# Patient Record
Sex: Female | Born: 1950
Health system: Southern US, Community
[De-identification: ages and names within clinical notes are randomized; demographics above are authoritative.]

## PROBLEM LIST (undated history)

## (undated) DIAGNOSIS — R0600 Dyspnea, unspecified: Secondary | ICD-10-CM

## (undated) DIAGNOSIS — I639 Cerebral infarction, unspecified: Secondary | ICD-10-CM

## (undated) DIAGNOSIS — H9190 Unspecified hearing loss, unspecified ear: Secondary | ICD-10-CM

## (undated) DIAGNOSIS — D62 Acute posthemorrhagic anemia: Secondary | ICD-10-CM

## (undated) DIAGNOSIS — K3189 Other diseases of stomach and duodenum: Secondary | ICD-10-CM

## (undated) DIAGNOSIS — K279 Peptic ulcer, site unspecified, unspecified as acute or chronic, without hemorrhage or perforation: Secondary | ICD-10-CM

## (undated) DIAGNOSIS — M810 Age-related osteoporosis without current pathological fracture: Secondary | ICD-10-CM

## (undated) DIAGNOSIS — B9681 Helicobacter pylori [H. pylori] as the cause of diseases classified elsewhere: Secondary | ICD-10-CM

## (undated) DIAGNOSIS — R011 Cardiac murmur, unspecified: Secondary | ICD-10-CM

## (undated) DIAGNOSIS — M1990 Unspecified osteoarthritis, unspecified site: Secondary | ICD-10-CM

## (undated) DIAGNOSIS — E7800 Pure hypercholesterolemia, unspecified: Secondary | ICD-10-CM

## (undated) DIAGNOSIS — D120 Benign neoplasm of cecum: Secondary | ICD-10-CM

## (undated) DIAGNOSIS — M86149 Other acute osteomyelitis, unspecified hand: Secondary | ICD-10-CM

## (undated) DIAGNOSIS — K2970 Gastritis, unspecified, without bleeding: Secondary | ICD-10-CM

## (undated) DIAGNOSIS — Z1211 Encounter for screening for malignant neoplasm of colon: Secondary | ICD-10-CM

## (undated) DIAGNOSIS — I2510 Atherosclerotic heart disease of native coronary artery without angina pectoris: Secondary | ICD-10-CM

## (undated) DIAGNOSIS — I219 Acute myocardial infarction, unspecified: Secondary | ICD-10-CM

## (undated) DIAGNOSIS — N179 Acute kidney failure, unspecified: Secondary | ICD-10-CM

## (undated) DIAGNOSIS — K253 Acute gastric ulcer without hemorrhage or perforation: Secondary | ICD-10-CM

## (undated) DIAGNOSIS — I10 Essential (primary) hypertension: Secondary | ICD-10-CM

## (undated) HISTORY — DX: Essential (primary) hypertension: I10

## (undated) HISTORY — DX: Acute gastric ulcer without hemorrhage or perforation: K25.3

## (undated) HISTORY — DX: Peptic ulcer, site unspecified, unspecified as acute or chronic, without hemorrhage or perforation: K27.9

## (undated) HISTORY — DX: Helicobacter pylori (H. pylori) as the cause of diseases classified elsewhere: K29.70

## (undated) HISTORY — DX: Encounter for screening for malignant neoplasm of colon: Z12.11

## (undated) HISTORY — PX: CAROTID STENT: SHX1301

## (undated) HISTORY — DX: Other diseases of stomach and duodenum: K31.89

## (undated) HISTORY — DX: Cerebral infarction, unspecified: I63.9

## (undated) HISTORY — PX: THYROID SURGERY: SHX805

## (undated) HISTORY — DX: Helicobacter pylori (H. pylori) as the cause of diseases classified elsewhere: B96.81

## (undated) HISTORY — DX: Acute myocardial infarction, unspecified: I21.9

## (undated) HISTORY — DX: Benign neoplasm of cecum: D12.0

## (undated) HISTORY — DX: Acute posthemorrhagic anemia: D62

## (undated) HISTORY — PX: INCONTINENCE SURGERY: SHX676

---
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
## 2001-02-15 ENCOUNTER — Ambulatory Visit (HOSPITAL_COMMUNITY): Admission: RE | Admit: 2001-02-15 | Discharge: 2001-02-15 | Payer: Self-pay | Admitting: Urology

## 2009-05-28 ENCOUNTER — Ambulatory Visit: Payer: Self-pay

## 2009-11-13 ENCOUNTER — Ambulatory Visit: Payer: Self-pay

## 2010-05-28 ENCOUNTER — Ambulatory Visit: Payer: Self-pay

## 2013-03-06 ENCOUNTER — Ambulatory Visit: Payer: Self-pay | Admitting: Family Medicine

## 2013-03-09 ENCOUNTER — Ambulatory Visit: Payer: Self-pay | Admitting: Family Medicine

## 2013-05-09 ENCOUNTER — Ambulatory Visit: Payer: Self-pay | Admitting: Urology

## 2014-01-11 LAB — HM PAP SMEAR: HM PAP: NEGATIVE

## 2014-05-15 ENCOUNTER — Inpatient Hospital Stay: Payer: Self-pay | Admitting: Internal Medicine

## 2014-05-15 LAB — COMPREHENSIVE METABOLIC PANEL
ALK PHOS: 108 U/L
ALT: 22 U/L
AST: 89 U/L — AB (ref 15–37)
Albumin: 4.1 g/dL (ref 3.4–5.0)
Anion Gap: 17 — ABNORMAL HIGH (ref 7–16)
BILIRUBIN TOTAL: 1 mg/dL (ref 0.2–1.0)
BUN: 12 mg/dL (ref 7–18)
CREATININE: 0.96 mg/dL (ref 0.60–1.30)
Calcium, Total: 9.4 mg/dL (ref 8.5–10.1)
Chloride: 107 mmol/L (ref 98–107)
Co2: 21 mmol/L (ref 21–32)
EGFR (African American): >60
EGFR (Non-African Amer.): >60
Glucose: 143 mg/dL — ABNORMAL HIGH (ref 65–99)
OSMOLALITY: 291 (ref 275–301)
Potassium: 3.7 mmol/L (ref 3.5–5.1)
Sodium: 145 mmol/L (ref 136–145)
Total Protein: 7.5 g/dL (ref 6.4–8.2)

## 2014-05-15 LAB — CK TOTAL AND CKMB (NOT AT ARMC)
CK, Total: 834 U/L — ABNORMAL HIGH
CK-MB: 86.4 ng/mL — ABNORMAL HIGH (ref 0.5–3.6)

## 2014-05-15 LAB — TROPONIN I
Troponin-I: 11 ng/mL — ABNORMAL HIGH
Troponin-I: >40 ng/mL

## 2014-05-15 LAB — CBC
HCT: 46.4 % (ref 35.0–47.0)
HGB: 15.4 g/dL (ref 12.0–16.0)
MCH: 30.6 pg (ref 26.0–34.0)
MCHC: 33.1 g/dL (ref 32.0–36.0)
MCV: 92 fL (ref 80–100)
Platelet: 229 10*3/uL (ref 150–440)
RBC: 5.02 10*6/uL (ref 3.80–5.20)
RDW: 13 % (ref 11.5–14.5)
WBC: 14.6 10*3/uL — ABNORMAL HIGH (ref 3.6–11.0)

## 2014-05-15 LAB — APTT
ACTIVATED PTT: 83.4 s — AB (ref 23.6–35.9)
Activated PTT: 31.9 secs (ref 23.6–35.9)

## 2014-05-15 LAB — CK-MB
CK-MB: 85.9 ng/mL — AB (ref 0.5–3.6)
CK-MB: 146.7 ng/mL — AB (ref 0.5–3.6)
CK-MB: 286.4 ng/mL — ABNORMAL HIGH (ref 0.5–3.6)

## 2014-05-15 LAB — PROTIME-INR
INR: 1
Prothrombin Time: 13 secs (ref 11.5–14.7)

## 2014-05-15 LAB — HEPARIN LEVEL (UNFRACTIONATED): Anti-Xa(Unfractionated): 0.44 IU/mL (ref 0.30–0.70)

## 2014-05-16 LAB — BASIC METABOLIC PANEL
ANION GAP: 12 (ref 7–16)
BUN: 9 mg/dL (ref 7–18)
CALCIUM: 8.2 mg/dL — AB (ref 8.5–10.1)
CREATININE: 0.9 mg/dL (ref 0.60–1.30)
Chloride: 109 mmol/L — ABNORMAL HIGH (ref 98–107)
Co2: 23 mmol/L (ref 21–32)
EGFR (African American): >60
EGFR (Non-African Amer.): >60
GLUCOSE: 121 mg/dL — AB (ref 65–99)
OSMOLALITY: 287 (ref 275–301)
Potassium: 3.9 mmol/L (ref 3.5–5.1)
Sodium: 144 mmol/L (ref 136–145)

## 2014-05-16 LAB — CBC WITH DIFFERENTIAL/PLATELET
BASOS ABS: 0.1 10*3/uL (ref 0.0–0.1)
Basophil %: 0.4 %
EOS PCT: 0.2 %
Eosinophil #: 0 10*3/uL (ref 0.0–0.7)
HCT: 40.1 % (ref 35.0–47.0)
HGB: 13.9 g/dL (ref 12.0–16.0)
LYMPHS PCT: 17.5 %
Lymphocyte #: 2.6 10*3/uL (ref 1.0–3.6)
MCH: 31.7 pg (ref 26.0–34.0)
MCHC: 34.6 g/dL (ref 32.0–36.0)
MCV: 92 fL (ref 80–100)
MONO ABS: 1.4 x10 3/mm — AB (ref 0.2–0.9)
MONOS PCT: 9.7 %
Neutrophil #: 10.6 10*3/uL — ABNORMAL HIGH (ref 1.4–6.5)
Neutrophil %: 72.2 %
Platelet: 211 10*3/uL (ref 150–440)
RBC: 4.38 10*6/uL (ref 3.80–5.20)
RDW: 13 % (ref 11.5–14.5)
WBC: 14.8 10*3/uL — AB (ref 3.6–11.0)

## 2014-05-16 LAB — TSH: Thyroid Stimulating Horm: 0.973 u[IU]/mL

## 2014-05-16 LAB — LIPID PANEL
Cholesterol: 192 mg/dL (ref 0–200)
HDL Cholesterol: 48 mg/dL (ref 40–60)
Ldl Cholesterol, Calc: 123 mg/dL — ABNORMAL HIGH (ref 0–100)
Triglycerides: 106 mg/dL (ref 0–200)
VLDL Cholesterol, Calc: 21 mg/dL (ref 5–40)

## 2014-05-16 LAB — HEPARIN LEVEL (UNFRACTIONATED): Anti-Xa(Unfractionated): <0.1 IU/mL — ABNORMAL LOW (ref 0.30–0.70)

## 2014-05-16 LAB — TROPONIN I: Troponin-I: >40 ng/mL

## 2014-05-16 LAB — CK-MB: CK-MB: 119.9 ng/mL — ABNORMAL HIGH (ref 0.5–3.6)

## 2014-11-08 LAB — BASIC METABOLIC PANEL
BUN: 17 mg/dL (ref 4–21)
Creatinine: 0.5 mg/dL (ref 0.5–1.1)
GLUCOSE: 101 mg/dL
POTASSIUM: 4.8 mmol/L (ref 3.4–5.3)
SODIUM: 141 mmol/L (ref 137–147)

## 2014-11-08 LAB — CBC AND DIFFERENTIAL
HEMATOCRIT: 42 % (ref 36–46)
Hemoglobin: 14.4 g/dL (ref 12.0–16.0)
NEUTROS ABS: 5 /uL
Platelets: 241 10*3/uL (ref 150–399)
WBC: 8.6 10*3/mL

## 2014-11-08 LAB — LIPID PANEL
Cholesterol: 306 mg/dL — AB (ref 0–200)
HDL: 51 mg/dL (ref 35–70)
Triglycerides: 738 mg/dL — AB (ref 40–160)

## 2014-11-08 LAB — TSH: TSH: 3.78 u[IU]/mL (ref 0.41–5.90)

## 2014-11-08 LAB — HEPATIC FUNCTION PANEL
ALK PHOS: 107 U/L (ref 25–125)
ALT: 16 U/L (ref 7–35)
AST: 28 U/L (ref 13–35)
BILIRUBIN DIRECT: 0.18 mg/dL (ref 0.01–0.4)
BILIRUBIN, TOTAL: 0.9 mg/dL

## 2014-11-08 LAB — HEMOGLOBIN A1C: Hemoglobin A1C: 5.3

## 2015-01-19 NOTE — Consult Note (Signed)
Chief Complaint:  Subjective/Chief Complaint Patient is much better today and no chest pain or shortness of breath.  anginal symptoms of resolved cardiac enzymes still high but trending down. patient eager to go home.   VITAL SIGNS/ANCILLARY NOTES: **Vital Signs.:   19-Aug-15 06:00  Pulse Pulse 87  Respirations Respirations 20  Systolic BP Systolic BP 237  Diastolic BP (mmHg) Diastolic BP (mmHg) 71  Mean BP 89  Pulse Ox % Pulse Ox % 99  Oxygen Delivery Room Air/ 21 %  *Intake and Output.:   Daily 19-Aug-15 07:00  Grand Totals Intake:  2051.6 Output:  1675    Net:  376.6 24 Hr.:  376.6  Oral Intake      In:  600  IV (Primary)      In:  142.1  IV (Primary)      In:  6.5  IV (Primary)      In:  3  IV (Primary)      In:  1300  Urine ml     Out:  1675  Length of Stay Totals Intake:  2051.6 Output:  1675    Net:  376.6   Brief Assessment:  GEN well developed, well nourished, no acute distress   Cardiac Regular  murmur present  -- LE edema  -- JVD   Respiratory normal resp effort   Gastrointestinal Normal   Gastrointestinal details normal Soft  Nontender  Nondistended   EXTR negative cyanosis/clubbing, negative edema   Lab Results: Thyroid:  19-Aug-15 00:20   Thyroid Stimulating Hormone 0.973 (0.45-4.50 (International Unit)  ----------------------- Pregnant patients have  different reference  ranges for TSH:  - - - - - - - - - -  Pregnant, first trimetser:  0.36 - 2.50 uIU/mL)  Hepatic:  18-Aug-15 08:42   Bilirubin, Total 1.0  Alkaline Phosphatase 108 (46-116 NOTE: New Reference Range 04/17/14)  SGPT (ALT) 22 (14-63 NOTE: New Reference Range 04/17/14)  SGOT (AST)  89  Total Protein, Serum 7.5  Albumin, Serum 4.1  Cardiology:  18-Aug-15 08:11   Ventricular Rate 78  Atrial Rate 78  P-R Interval 118  QRS Duration 86  QT 400  QTc 456  R Axis 26  T Axis 49  ECG interpretation Normal sinus rhythm Septal infarct , age undetermined Abnormal ECG When  compared with ECG of 17-Nov-2002 17:50, Sinus rhythm has replaced Ectopic atrial rhythm Septal infarct is now Present ST now depressed in Anterior leads Non-specific change in ST segment in Lateral leads Nonspecific T wave abnormality, improved in Lateral leads QT has lengthened ----------unconfirmed---------- Confirmed by OVERREAD, NOT (100), editor PEARSON, BARBARA ([id number]) on 05/16/2014 8:11:01 AM  P Axis -5    08:24   Ventricular Rate 76  Atrial Rate 76  P-R Interval 120  QRS Duration 78  QT 400  QTc 450  R Axis 6  T Axis 59  ECG interpretation Normal sinus rhythm Septal infarct , age undetermined Abnormal ECG When compared with ECG of 17-Nov-2002 17:50, Sinus rhythm has replaced Ectopic atrial rhythm Septal infarct is now Present Non-specific change in ST segment in Lateral leads QThas lengthened ----------unconfirmed---------- Confirmed by OVERREAD, NOT (100), editor PEARSON, BARBARA ([id number]) on 05/16/2014 8:11:10 AM  P Axis -19    08:48   Ventricular Rate 80  Atrial Rate 80  P-R Interval 122  QRS Duration 80  QT 400  QTc 461  R Axis 10  T Axis 74  ECG interpretation Normal sinus rhythm Possible Anterolateral infarct , age undetermined Abnormal ECG  When compared with ECG of 17-Nov-2002 17:50, Sinus rhythm has replaced Ectopic atrial rhythm Borderline criteria for Anterior infarct are now Present Borderline criteria for Anterolateral infarct are now Present Non-specific change in ST segment in Lateral leads QT has lengthened ----------unconfirmed---------- Confirmed by OVERREAD, NOT (100), editor PEARSON, BARBARA ([id number]) on 05/16/2014 8:11:14 AM  Routine Chem:  18-Aug-15 08:42   Result Comment Troponin - RESULTS VERIFIED BY REPEAT TESTING.  - Elevated troponin called to and read  - back by Edwin Dada in ER @ [phone] 05/15/14.  - BGB  Result(s) reported on 15 May 2014 at 09:41AM.  Glucose, Serum  143  BUN 12  Creatinine (comp) 0.96  Sodium, Serum 145  Potassium, Serum  3.7  Chloride, Serum 107  CO2, Serum 21  Calcium (Total), Serum 9.4  Anion Gap  17  Osmolality (calc) 291  eGFR (African American) >60  eGFR (Non-African American) >60 (eGFR values <66m/min/1.73 m2 may be an indication of chronic kidney disease (CKD). Calculated eGFR is useful in patients with stable renal function. The eGFR calculation will not be reliable in acutely ill patients when serum creatinine is changing rapidly. It is not useful in  patients on dialysis. The eGFR calculation may not be applicable to patients at the low and high extremes of body sizes, pregnant women, and vegetarians.)    16:18   Result Comment TROPONIN - RESULTS VERIFIED BY REPEAT TESTING.  - READ-BACK PROCESS PERFORMED.  - C/BRAYLEE MOORE IN CCU AT 2057 ON 8/18/1  - 5 [facility]  Result(s) reported on 15 May 2014 at 08:58PM.  19-Aug-15 00:20   Result Comment TROPONIN - RESULTS VERIFIED BY REPEAT TESTING.  - PREV. C/ 05-15-14 @2057  BY SNC..Marland KitchenJO  Result(s) reported on 16 May 2014 at 02:03AM.  Glucose, Serum  121  BUN 9  Creatinine (comp) 0.90  Sodium, Serum 144  Potassium, Serum 3.9  Chloride, Serum  109  CO2, Serum 23  Calcium (Total), Serum  8.2  Anion Gap 12  Osmolality (calc) 287  eGFR (African American) >60  eGFR (Non-African American) >60 (eGFR values <649mmin/1.73 m2 may be an indication of chronic kidney disease (CKD). Calculated eGFR is useful in patients with stable renal function. The eGFR calculation will not be reliable in acutely ill patients when serum creatinine is changing rapidly. It is not useful in  patients on dialysis. The eGFR calculation may not be applicable to patients at the low and high extremes of body sizes, pregnant women, and vegetarians.)  Cholesterol, Serum 192  Triglycerides, Serum 106  HDL (INHOUSE) 48  VLDL Cholesterol Calculated 21  LDL Cholesterol Calculated  123 (Result(s) reported on 16 May 2014 at 01:04AM.)  Cardiac:  18-Aug-15 08:42   CPK-MB, Serum  85.9  (Result(s) reported on 15 May 2014 at 10:00AM.)  CPK-MB, Serum  86.4 (Result(s) reported on 15 May 2014 at 09:29AM.)  Troponin I  11.00 (0.00-0.05 0.05 ng/mL or less: NEGATIVE  Repeat testing in 3-6 hrs  if clinically indicated. >0.05 ng/mL: POTENTIAL  MYOCARDIAL INJURY. Repeat  testing in 3-6 hrs if  clinically indicated. NOTE: An increase or decrease  of 30% or more on serial  testing suggests a  clinically important change)  CK, Total  834 (26-192 NOTE: NEW REFERENCE RANGE  10/30/2013)    13:28   CPK-MB, Serum  146.7 (Result(s) reported on 15 May 2014 at 02:11PM.)    16:18   CPK-MB, Serum  286.4 (Result(s) reported on 15 May 2014 at 05:08PM.)  Troponin I  > 40.00 (  0.00-0.05 0.05 ng/mL or less: NEGATIVE  Repeat testing in 3-6 hrs  if clinically indicated. >0.05 ng/mL: POTENTIAL  MYOCARDIAL INJURY. Repeat  testing in 3-6 hrs if  clinically indicated. NOTE: An increase or decrease  of 30% or more on serial  testing suggests a  clinically important change)  19-Aug-15 00:20   CPK-MB, Serum  119.9 (Result(s) reported on 16 May 2014 at 12:59AM.)  Troponin I  > 40.00 (0.00-0.05 0.05 ng/mL or less: NEGATIVE  Repeat testing in 3-6 hrs  if clinically indicated. >0.05 ng/mL: POTENTIAL  MYOCARDIAL INJURY. Repeat  testing in 3-6 hrs if  clinically indicated. NOTE: An increase or decrease  of 30% or more on serial  testing suggests a  clinically important change)  Routine Coag:  18-Aug-15 08:42   Activated PTT (APTT) 31.9 (A HCT value >55% may artifactually increase the APTT. In one study, the increase was an average of 19%. Reference: "Effect on Routine and Special Coagulation Testing Values of Citrate Anticoagulant Adjustment in Patients with High HCT Values." American Journal of Clinical Pathology 2006;126:400-405.)  Prothrombin 13.0  INR 1.0 (INR reference interval applies to patients on anticoagulant therapy. A single INR therapeutic range for coumarins is not optimal  for all indications; however, the suggested range for most indications is 2.0 - 3.0. Exceptions to the INR Reference Range may include: Prosthetic heart valves, acute myocardial infarction, prevention of myocardial infarction, and combinations of aspirin and anticoagulant. The need for a higher or lower target INR must be assessed individually. Reference: The Pharmacology and Management of the Vitamin K  antagonists: the seventh ACCP Conference on Antithrombotic and Thrombolytic Therapy. LEXNT.7001 Sept:126 (3suppl): [id number]. A HCT value >55% may artifactually increase the PT.  In one study,  the increase was an average of 25%. Reference:  "Effect on Routine and Special Coagulation Testing Values of Citrate Anticoagulant Adjustment in Patients with High HCT Values." American Journal of Clinical Pathology 2006;126:400-405.)    16:18   Activated PTT (APTT)  83.4 (A HCT value >55% may artifactually increase the APTT. In one study, the increase was an average of 19%. Reference: "Effect on Routine and Special Coagulation Testing Values of Citrate Anticoagulant Adjustment in Patients with High HCT Values." American Journal of Clinical Pathology 2006;126:400-405.)  Routine Hem:  18-Aug-15 08:42   WBC (CBC)  14.6  RBC (CBC) 5.02  Hemoglobin (CBC) 15.4  Hematocrit (CBC) 46.4  Platelet Count (CBC) 229 (Result(s) reported on 15 May 2014 at 09:03AM.)  MCV 92  MCH 30.6  MCHC 33.1  RDW 13.0  19-Aug-15 00:20   WBC (CBC)  14.8  RBC (CBC) 4.38  Hemoglobin (CBC) 13.9  Hematocrit (CBC) 40.1  Platelet Count (CBC) 211  MCV 92  MCH 31.7  MCHC 34.6  RDW 13.0  Neutrophil % 72.2  Lymphocyte % 17.5  Monocyte % 9.7  Eosinophil % 0.2  Basophil % 0.4  Neutrophil #  10.6  Lymphocyte # 2.6  Monocyte #  1.4  Eosinophil # 0.0  Basophil # 0.1 (Result(s) reported on 16 May 2014 at 12:51AM.)   Radiology Results: XRay:    18-Aug-15 08:44, Chest Portable Single View  Chest Portable Single View    REASON FOR EXAM:    Chest Pain  COMMENTS:       PROCEDURE: DXR - DXR PORTABLE CHEST SINGLE VIEW  - May 15 2014  8:44AM     CLINICAL DATA:  Chest pain    EXAM:  PORTABLE CHEST - 1 VIEW    COMPARISON:  05/15/2014    FINDINGS:  Cardiomediastinal silhouette is unremarkable. No acute infiltrate or  pleural effusion. No pulmonary edema. Bony thorax is unremarkable.   IMPRESSION:  No active disease.      Electronically Signed    By: Lahoma Crocker M.D.    On: 05/15/2014 09:49         Verified By: Ephraim Hamburger, M.D.,  Cardiology:    18-Aug-15 08:11, ED ECG  Ventricular Rate 78  Atrial Rate 78  P-R Interval 118  QRS Duration 86  QT 400  QTc 456  P Axis -5  R Axis 26  T Axis 49  ECG interpretation   Normal sinus rhythm  Septal infarct , age undetermined  Abnormal ECG  When compared with ECG of 17-Nov-2002 17:50,  Sinus rhythm has replaced Ectopic atrial rhythm  Septal infarct is now Present  ST now depressed in Anterior leads  Non-specific change in ST segment in Lateral leads  Nonspecific T wave abnormality, improved in Lateral leads  QT has lengthened  ----------unconfirmed----------  Confirmed by OVERREAD, NOT (100), editor PEARSON, BARBARA ([id number]) on 05/16/2014 8:11:01 AM  ED ECG     18-Aug-15 08:24, ED ECG  Ventricular Rate 76  Atrial Rate 76  P-R Interval 120  QRS Duration 78  QT 400  QTc 450  P Axis -19  R Axis 6  T Axis 59  ECG interpretation   Normal sinus rhythm  Septal infarct , age undetermined  Abnormal ECG  When compared with ECG of 17-Nov-2002 17:50,  Sinus rhythm has replaced Ectopic atrial rhythm  Septal infarct is now Present  Non-specific change in ST segment in Lateral leads  QThas lengthened  ----------unconfirmed----------  Confirmed by OVERREAD, NOT (100), editor PEARSON, BARBARA ([id number]) on 05/16/2014 8:11:10 AM  ED ECG     18-Aug-15 08:48, ECG  Ventricular Rate 80  Atrial Rate 80  P-R Interval 122  QRS Duration 80  QT 400  QTc 461  R  Axis 10  T Axis 74  ECG interpretation   Normal sinus rhythm  Possible Anterolateral infarct , age undetermined  Abnormal ECG  When compared with ECG of 17-Nov-2002 17:50,  Sinus rhythm has replaced Ectopic atrial rhythm  Borderline criteria for Anterior infarct are now Present  Borderline criteria for Anterolateral infarct are now Present  Non-specific change in ST segment in Lateral leads  QT has lengthened  ----------unconfirmed----------  Confirmed by OVERREAD, NOT (100), editor PEARSON, BARBARA ([id number]) on 05/16/2014 8:11:14 AM  ECG    Assessment/Plan:  Assessment/Plan:  Assessment IMP S/P NQMI [place] S/P PCI stent DES CAD HTN Smoking Hyperlipidemia Cardiomyopathy .   Plan PLAN  continue to follow up cardiac enzymes  transferred to telemetry and increase activity  agree with statin therapy Lipitor 40 mg a day  aspirin 325 once a day  Plavix therapy 75 mg once a day for at least a year  advised to quit smoking  beta-blocker low-dose ACE-inhibitor for hypertension  hopefully discharge within the next 24-48 hrs  recommend cardiac rehab   Electronic Signatures: Lujean Amel D (MD)  (Signed 19-Aug-15 11:01)  Authored: Chief Complaint, VITAL SIGNS/ANCILLARY NOTES, Brief Assessment, Lab Results, Radiology Results, Assessment/Plan   Last Updated: 19-Aug-15 11:01 by Lujean Amel D (MD)

## 2015-01-19 NOTE — Discharge Summary (Signed)
PATIENT NAME:  Tonya Cabrera, Tonya Cabrera MR#:  [id number] DATE OF BIRTH:  [birth year]  DATE OF ADMISSION:  05/15/2014 DATE OF DISCHARGE:  05/17/2014  ADMITTING PHYSICIAN: Dustin Flock, MD.  DISCHARGING PHYSICIAN:  Gladstone Lighter, MD.  PRIMARY CARE PHYSICIAN: [place].  CONSULTATIONS IN THE HOSPITAL: Cardiology consultation by Dr. Clayborn Bigness.  DISCHARGE DIAGNOSES:  1.  Non-ST segment elevation myocardial infarction, status post 100% left circumflex artery stenosis with angioplasty and drug-eluting stent placement.  2.  Hypertension.  3.  Hyperlipidemia.  4.  Tobacco use disorder.  5.  Ischemic cardiomyopathy, ejection fraction of 40% on cardiac catheterization.  DISCHARGE HOME MEDICATIONS:  1.  Lisinopril 5 mg p.o. daily.  2.  Atorvastatin 40 mg p.o. daily.  3.  Plavix 75 mg p.o. daily.  4.  Aspirin 325 mg p.o. daily.  5.  Metoprolol 25 mg p.o. b.i.d.  6.  Nicotine patch 14 mg transdermal daily.   DISCHARGE DIET: Low-sodium, low-fat diet.   DISCHARGE ACTIVITY: As tolerated.  FOLLOWUP INSTRUCTIONS:  1.  PCP follow-up in 1-2 weeks.  2.  Cardiology follow-up in 1-2 weeks.   DISCHARGE INSTRUCTIONS:  Advised to quit smoking.   LABORATORIES AND IMAGING STUDIES PRIOR TO DISCHARGE: WBC 14.8, hemoglobin 13.9, hematocrit 40.1, platelet count 211,000.   Sodium 144, potassium 3.9, chloride 109, bicarbonate 23, BUN 9, creatinine 0.9, glucose 121, and calcium of 8.2.   LDL cholesterol 123, HDL 48, total cholesterol 192, triglycerides 106. TSH 0.973. Troponins were greater than 40. CT angiogram of the chest showing no evidence of any dissection, partially calcified ligamentum arteriosum and atherosclerosis  including coronary artery disease and nonobstructing left nephrolithiasis seen.   BRIEF HOSPITAL COURSE: Tonya Cabrera is a 64 year old Caucasian female with no significant past medical history other than hyperlipidemia and smoking, presents to the hospital secondary to chest pain  which was worsening with exertion and noted to have elevated troponin.   Non-ST segment elevation myocardial infarction admitted to CCU, went for cardiac catheterization when his troponin was rising, seen by cardiologist Dr. Clayborn Bigness.  It showed 100% left circumflex stenosis, and she had angioplasty and PCI done with a stent placement. Postoperatively, the patient was on Aggrastat in the CCU for 18 hours.  Chest pain relieved completely. She was able to ambulate without any recurrence of symptoms. She is on aspirin and Plavix.  She was also noted to have ejection fraction of 40%, likely ischemic cardiomyopathy.  So, she is also on metoprolol, lisinopril, and statin. She was strongly advised to quit smoking. She said the patch has helped her in the hospital, so is being discharged on nicotine patches. Her blood pressure is on the low normal side, so medication doses have been adjusted and she will follow up with Dr. Clayborn Bigness as an outpatient. Her course has been, otherwise, uneventful in the hospital.   DISCHARGE CONDITION: Stable.   DISCHARGE DISPOSITION: Home.   TIME SPENT ON DISCHARGE: 45 minutes.   ____________________________ Gladstone Lighter, MD rk:TT D: 05/17/2014 14:03:00 ET T: 05/17/2014 17:06:16 ET JOB#: [id number]  cc: Gladstone Lighter, MD, <Dictator> [facility] Dwayne D. Clayborn Bigness, MD Gladstone Lighter MD ELECTRONICALLY SIGNED 05/18/2014 13:58

## 2015-01-19 NOTE — Consult Note (Signed)
PATIENT NAME:  Tonya Cabrera, Tonya Cabrera MR#:  [id number] DATE OF BIRTH:  09-10-1951  DATE OF CONSULTATION:  05/15/2014  REFERRING PHYSICIAN:   CONSULTING PHYSICIAN:  Tonya Sarver D. Clayborn Bigness, MD  PRIMARY CARE PHYSICIAN: Dr. Sanda Klein  CARDIOLOGIST: She has seen Dr. Nehemiah Massed in the past.  INDICATION: Unstable angina, possible non-Q-wave myocardial infarction.   HISTORY OF PRESENT ILLNESS: Tonya Cabrera is a 64 year old white female, smoker, with hypertension and hyperlipidemia, has had some issues with compliance. Reportedly had a functional study about a year ago which seemed okay. She presented over the last 2 to 3 days with chest pain, arm pain and jaw pain, waxing and waning, relatively persistent and recurrent, moderate in nature, probably 8 out of 10. She had had some trouble sleeping and got progressively worse. She started having some nausea today so she decided to come to the Emergency Room for evaluation. In the ER, EKG showed some dynamic changes so the Emergency Room doctor decided to have her admitted and have cardiology involvement. The patient had some relief with aspirin and nitrates. She was placed on heparin and now needs further evaluation.   REVIEW OF SYSTEMS: No blackout spells or syncope. She has had some nausea, mild vomiting. No fever, chills, sweats, weight loss or weight gain. No hemoptysis, hematemesis. No bright red blood per rectum.   PAST MEDICAL HISTORY: Hypertension, hyperlipidemia, smoking.   FAMILY HISTORY: Noncontributory.   SOCIAL HISTORY: Cleans houses, semiretired. She is a smoker. Denies alcohol consumption.   ALLERGIES: She states none.   HOME MEDICATIONS: Lisinopril 10 mg a day. She should be on Lipitor, but has not taken any in months.   PHYSICAL EXAMINATION: VITAL SIGNS: Blood pressure was 165/100, pulse 75, respiratory rate 12, afebrile.  HEENT: Normocephalic, atraumatic. Pupils equal and reactive to light.  NECK: Supple. No significant JVD, bruits or adenopathy.   LUNGS: Clear to auscultation and percussion. No significant wheezing, rhonchi, or rale.  HEART: Regular rate and rhythm. Positive bowel sounds. No rebound, guarding or tenderness.  EXTREMITIES: Within normal limits.  NEUROLOGIC: Intact.  SKIN: Normal.   DIAGNOSTIC DATA: MET-B unremarkable except for glucose of 143. LFTs negative. Initial CPK 834, MB 86. Troponin 11.   EKG: Normal sinus rhythm, diffuse ST segment depression in II, III and F, borderline elevation in I and L. Rate was 75.   ASSESSMENT: 1.  Non-Q-wave myocardial infarction.  2.  Unstable angina.  3.  Hypertension.  4.  Hyperlipidemia.  5.  Smoking.   PLAN: Admit. Place on nitrates, aspirin. Add Plavix. Heparin as per nomogram. Will probably add Aggrastat. Continue blood pressure control. Probably add a beta blocker. Reinstitute statin therapy. Continue low-dose ACE inhibitor. Advised the patient to quit smoking. Probably proceed with cardiac catheterization for evaluation at this stage and intention to treat as a non-ST-segment elevation myocardial infarction with cardiac catheterization within the next 24 hours.  ____________________________ Loran Senters Clayborn Bigness, MD ddc:sb D: 05/15/2014 11:00:23 ET T: 05/15/2014 11:14:18 ET JOB#: [id number]  cc: Liborio Saccente D. Clayborn Bigness, MD, <Dictator> Yolonda Kida MD ELECTRONICALLY SIGNED 05/31/2014 12:59

## 2015-01-19 NOTE — H&P (Signed)
PATIENT NAME:  Tonya Cabrera, Tonya Cabrera MR#:  [id number] DATE OF BIRTH:  10-16-1950  DATE OF ADMISSION:  05/15/2014  PRIMARY CARE PROVIDER: [facility].   EMERGENCY DEPARTMENT REFERRING PHYSICIAN: Dr. Jacqualine Code.  CHIEF COMPLAINT: Chest pain.   HISTORY OF PRESENT ILLNESS: The patient is a 64 year old white female with history of hypertension, hyperlipidemia, previous history of small CVA, and a thyroid goiter that was resected, who presents with complaint of having substernal chest pressure and pain ongoing for the past 2 days. The patient reported that she started having pain after mowing her lawn and it continued to persist. She had episodes of coming and going of the pressure but also had sharp pain. She has pain also radiating to her back and her neck. She also was feeling like her heart was beating fast. The patient had evaluation with cardiac enzymes and her troponin was noted to be 11.0. The patient was already seen by cardiology, Dr. Clayborn Bigness, and he is planning to take her to the cardiac catheterization lab. The patient has already received a dose of Plavix 300 mg and aspirin. She is currently on a nitroglycerin drip and is chest-pain free.   PAST MEDICAL HISTORY:  1.  Hypertension.  2.  Hyperlipidemia.  3.  Previous history of CVA.  4.  History of thyroid goiter status post partial resection.   ALLERGIES: None.   CURRENT MEDICATIONS: She is on lisinopril 10 mg daily, atorvastatin 20 mg daily.   SOCIAL HISTORY: Smokes 1 pack per day. Drinks socially. No drug use.   FAMILY HISTORY: Positive for coronary artery disease and CVA.   REVIEW OF SYSTEMS: CONSTITUTIONAL: Denies any fevers, chills. No weight loss. No weight gain.  HEENT: Denies any visual difficulties. No blurred vision. No pain. No redness in the eyes. Denies any tinnitus, ear pain. No hearing loss. No seasonal or year-round allergies. No epistaxis. No difficulty swallowing.  RESPIRATORY: Denies any cough, wheezing. No  hemoptysis. No COPD.  CARDIOVASCULAR: Complains of chest pain, pressure, no orthopnea, or arrhythmia. No syncope.  GASTROINTESTINAL: Denies any nausea, vomiting, diarrhea. No abdominal pain. No hematemesis. No hematochezia.  GENITOURINARY: Denies any frequency, urgency or hesitancy.  ENDOCRINE: Denies any polyuria, nocturia. Does have history of thyroid goiter, status post resection.  HEMATOLOGIC AND LYMPHATIC: Denies any anemia, easy bruisability or bleeding.  SKIN: No rash. No acne.  MUSCULOSKELETAL: Denies any pain in the neck, back or shoulder.  NEUROLOGIC: No CVA, TIA. PSYCHIATRIC: Denies any anxiety, insomnia,   PHYSICAL EXAMINATION:  VITAL SIGNS: Temperature 97.7, pulse 82, respirations 24, blood pressure on admission 206/92, currently 165/99.  GENERAL: The patient is a well-developed female.  HEENT: Head atraumatic, normocephalic. Pupils equally round, reactive to light and accommodation. There is no conjunctival pallor. No sclerae icterus. Nasal exam shows no drainage or ulceration. Oropharynx is clear without any exudate. External ear exam shows no erythema or drainage. Nasal exam shows no drainage or ulceration.  NECK: Supple without any JVD. No thyromegaly.  CARDIOVASCULAR: Regular rate and rhythm. No murmurs, rubs, clicks, or gallops.  LUNGS: Clear to auscultation bilaterally without any rales, rhonchi, wheezing.  ABDOMEN: Soft, nontender, nondistended. Positive bowel sounds x4.  EXTREMITIES: No clubbing, cyanosis, or edema.  SKIN: No rash.  LYMPHATICS: No lymph nodes palpable.  VASCULAR: Good DP, PT pulses.  PSYCHIATRIC: Not anxious or depressed.  NEUROLOGIC: Awake, alert, oriented x 3, lymph nodes nonpalpable.  SKIN: No rash.   LABORATORY DATA: Glucose 143, BUN 12, creatinine 0.96, sodium 145, potassium 3.7, chloride 107,  CO2 21, calcium 9.4. LFTs are normal except AST of 89, ALT of 22. CPK 834, CK-MB 86.4. Troponin 11.0, WBC 14.6, hemoglobin 15.4, platelet count 229, INR  1.0.   EKG shows normal sinus rhythm with nonspecific, T-wave changes.   Chest x-ray shows no active disease.   ASSESSMENT AND PLAN: The patient is a 64 year old white female presenting with a non-ST myocardial infarction.  1.  Non-ST myocardial infarction. At this time she will be on IV heparin, aspirin, she received Plavix. The patient was seen by Dr. Clayborn Bigness; for cardiac catheterization in the next few hours. We will continue nitroglycerin and heparin for the time being.  2.  Hypertension, accelerated on presentation. I will start her on metoprolol. Continue nitroglycerin. Titrate for blood pressure control.  3.  Hyperlipidemia. We will give her a higher dose atorvastatin, check a fasting lipid panel in the a.m.  4.  History of cerebrovascular accident, not on aspirin.  She should be on some sort of antiplatelet agent at discharge.  5.  Nicotine addiction. Smoking cessation, 4 minutes spent. Nicotine patch started. I strongly recommended that she stop smoking.   Time spent with this patient: 60 minutes of critical care time with this patient with a non-ST myocardial infarction, who is critical and at high risk of cardiac arrhythmias and cardiac complications. She will be monitored in the Critical Care Unit.    ____________________________ Lafonda Mosses. Posey Pronto, MD shp:lt D: 05/15/2014 10:43:10 ET T: 05/15/2014 11:12:49 ET JOB#: [id number]  cc: Gladyce Mcray H. Posey Pronto, MD, <Dictator> Alric Seton MD ELECTRONICALLY SIGNED 05/20/2014 14:02

## 2015-01-19 NOTE — Consult Note (Signed)
Brief Consult Note: Diagnosis: NQMI/[place].   Patient was seen by consultant.   Consult note dictated.   Recommend further assessment or treatment.   Orders entered.   Discussed with Attending MD.   Comments: IMP NQMI [place] CAD Abn Ekg HTN Hyperlipidemia Smoking . PLAN Admit ASA Plavix Heparin NTG IV B-blockers Statin Aggrastat Cath today.  Electronic Signatures: Lujean Amel D (MD)  (Signed 18-Aug-15 10:06)  Authored: Brief Consult Note   Last Updated: 18-Aug-15 10:06 by Yolonda Kida (MD)

## 2015-02-19 ENCOUNTER — Encounter (INDEPENDENT_AMBULATORY_CARE_PROVIDER_SITE_OTHER): Payer: Self-pay

## 2015-02-28 DIAGNOSIS — N179 Acute kidney failure, unspecified: Secondary | ICD-10-CM | POA: Insufficient documentation

## 2015-02-28 DIAGNOSIS — I10 Essential (primary) hypertension: Secondary | ICD-10-CM | POA: Insufficient documentation

## 2015-02-28 DIAGNOSIS — L0390 Cellulitis, unspecified: Secondary | ICD-10-CM | POA: Insufficient documentation

## 2015-02-28 HISTORY — DX: Acute kidney failure, unspecified: N17.9

## 2015-03-01 DIAGNOSIS — M86149 Other acute osteomyelitis, unspecified hand: Secondary | ICD-10-CM

## 2015-03-01 HISTORY — DX: Other acute osteomyelitis, unspecified hand: M86.149

## 2015-05-27 ENCOUNTER — Encounter: Payer: Self-pay | Admitting: Pharmacist

## 2015-11-07 ENCOUNTER — Ambulatory Visit: Payer: Self-pay

## 2015-11-14 DIAGNOSIS — I129 Hypertensive chronic kidney disease with stage 1 through stage 4 chronic kidney disease, or unspecified chronic kidney disease: Secondary | ICD-10-CM | POA: Insufficient documentation

## 2015-11-14 DIAGNOSIS — I10 Essential (primary) hypertension: Secondary | ICD-10-CM

## 2015-12-05 ENCOUNTER — Other Ambulatory Visit: Payer: Self-pay

## 2015-12-05 DIAGNOSIS — I10 Essential (primary) hypertension: Secondary | ICD-10-CM

## 2015-12-05 DIAGNOSIS — Z8673 Personal history of transient ischemic attack (TIA), and cerebral infarction without residual deficits: Secondary | ICD-10-CM

## 2015-12-06 LAB — COMPREHENSIVE METABOLIC PANEL
ALT: 20 IU/L (ref 0–32)
AST: 17 IU/L (ref 0–40)
Albumin/Globulin Ratio: 2 (ref 1.1–2.5)
Albumin: 4.3 g/dL (ref 3.6–4.8)
Alkaline Phosphatase: 87 IU/L (ref 39–117)
BUN/Creatinine Ratio: 18 (ref 11–26)
BUN: 16 mg/dL (ref 8–27)
Bilirubin Total: 1.4 mg/dL — ABNORMAL HIGH (ref 0.0–1.2)
CALCIUM: 9.2 mg/dL (ref 8.7–10.3)
CO2: 23 mmol/L (ref 18–29)
CREATININE: 0.91 mg/dL (ref 0.57–1.00)
Chloride: 103 mmol/L (ref 96–106)
GFR calc Af Amer: 77 mL/min/{1.73_m2} (ref >59)
GFR calc non Af Amer: 67 mL/min/{1.73_m2} (ref >59)
GLOBULIN, TOTAL: 2.1 g/dL (ref 1.5–4.5)
Glucose: 175 mg/dL — ABNORMAL HIGH (ref 65–99)
Potassium: 3.5 mmol/L (ref 3.5–5.2)
Sodium: 141 mmol/L (ref 134–144)
Total Protein: 6.4 g/dL (ref 6.0–8.5)

## 2015-12-06 LAB — LIPID PANEL
CHOLESTEROL TOTAL: 168 mg/dL (ref 100–199)
Chol/HDL Ratio: 3.2 ratio units (ref 0.0–4.4)
HDL: 53 mg/dL (ref >39)
LDL CALC: 87 mg/dL (ref 0–99)
TRIGLYCERIDES: 141 mg/dL (ref 0–149)
VLDL CHOLESTEROL CAL: 28 mg/dL (ref 5–40)

## 2015-12-06 LAB — CBC WITH DIFFERENTIAL/PLATELET
BASOS: 0 %
Basophils Absolute: 0 10*3/uL (ref 0.0–0.2)
EOS (ABSOLUTE): 0.1 10*3/uL (ref 0.0–0.4)
EOS: 1 %
HEMATOCRIT: 35.9 % (ref 34.0–46.6)
Hemoglobin: 12 g/dL (ref 11.1–15.9)
IMMATURE GRANULOCYTES: 0 %
Immature Grans (Abs): 0 10*3/uL (ref 0.0–0.1)
LYMPHS ABS: 2.3 10*3/uL (ref 0.7–3.1)
Lymphs: 32 %
MCH: 30.2 pg (ref 26.6–33.0)
MCHC: 33.4 g/dL (ref 31.5–35.7)
MCV: 90 fL (ref 79–97)
MONOS ABS: 0.4 10*3/uL (ref 0.1–0.9)
Monocytes: 6 %
NEUTROS PCT: 61 %
Neutrophils Absolute: 4.4 10*3/uL (ref 1.4–7.0)
Platelets: 185 10*3/uL (ref 150–379)
RBC: 3.97 x10E6/uL (ref 3.77–5.28)
RDW: 13.6 % (ref 12.3–15.4)
WBC: 7.3 10*3/uL (ref 3.4–10.8)

## 2015-12-06 LAB — TSH: TSH: 2.16 u[IU]/mL (ref 0.450–4.500)

## 2015-12-12 ENCOUNTER — Ambulatory Visit: Payer: Self-pay | Admitting: Nurse Practitioner

## 2015-12-12 VITALS — BP 138/59 | HR 64 | Ht 62.5 in | Wt 142.0 lb

## 2015-12-12 DIAGNOSIS — I10 Essential (primary) hypertension: Secondary | ICD-10-CM

## 2015-12-12 DIAGNOSIS — R600 Localized edema: Secondary | ICD-10-CM

## 2015-12-12 DIAGNOSIS — E782 Mixed hyperlipidemia: Secondary | ICD-10-CM

## 2015-12-12 DIAGNOSIS — R7309 Other abnormal glucose: Secondary | ICD-10-CM

## 2015-12-12 MED ORDER — FUROSEMIDE 20 MG PO TABS: 20.0000 mg | ORAL_TABLET | ORAL | Status: DC

## 2015-12-12 NOTE — Progress Notes (Signed)
   Subjective:    Patient ID: Tonya Cabrera, female    DOB: [birth year], 65 y.o.   MRN: [id number]  HPI Here today for follow up, concern re:  New lower extremity edema, drinks approx 16 oz of pepsi, has started working for a [employer], with variable amount of standing.  Edema resolves over night.  Salt added to food, heavily.    Has not been taking hctz, and bp stable, had been on higher dose metoprolol in past with severe dizziness.    Last blood draw, cbg at 175.     Review of Systems  Constitutional: Negative for chills and appetite change.  Cardiovascular: Positive for leg swelling.  Endocrine: Negative for cold intolerance and polydipsia.  Genitourinary: Negative for decreased urine volume.  Neurological: Negative for tremors and weakness.       Objective:   Physical Exam  Constitutional: She is oriented to person, place, and time.  Neck: No thyromegaly present.  Cardiovascular: Normal rate, regular rhythm and normal heart sounds.   Carotid bruits not present  Pulmonary/Chest: She has no wheezes. She has no rales.  BBrS diminished mildly as expected in a former smoker  Musculoskeletal: She exhibits edema.  BLE edema, ending below the knee, L>R, approx 1+ at max.    Lymphadenopathy:    She has no cervical adenopathy.  Neurological: She is alert and oriented to person, place, and time.  Psychiatric: She has a normal mood and affect.  Nursing note reviewed.         Assessment & Plan:  Diagnoses and all orders for this visit:  Essential hypertension Comments: will not change any antihypertensive meds at this time, will consider adding very low dose diuretic every day or every other day, will look at cmp in 3-4 months Orders: -     Comp Met (CMET); Future  Bilateral edema of lower extremity Comments: will add 10 mg of furosemide every other day, and reassess at next visit Orders: -     furosemide (LASIX) 20 MG tablet; Take 1 tablet (20 mg total) by mouth  every other day. -     Comp Met (CMET); Future  Abnormal glucose Comments: will check A1c at next lab draw.  Have also instructed to decrease concentrated carbs in diet.  especially pepsi Orders: -     HgB A1c; Future  Mixed hyperlipidemia -     Lipid Profile; Future

## 2016-04-09 ENCOUNTER — Other Ambulatory Visit: Payer: Self-pay

## 2016-04-09 DIAGNOSIS — I10 Essential (primary) hypertension: Secondary | ICD-10-CM

## 2016-04-09 DIAGNOSIS — R600 Localized edema: Secondary | ICD-10-CM

## 2016-04-09 DIAGNOSIS — R7309 Other abnormal glucose: Secondary | ICD-10-CM

## 2016-04-09 DIAGNOSIS — E782 Mixed hyperlipidemia: Secondary | ICD-10-CM

## 2016-04-10 LAB — COMPREHENSIVE METABOLIC PANEL
A/G RATIO: 1.9 (ref 1.2–2.2)
ALBUMIN: 4.5 g/dL (ref 3.6–4.8)
ALT: 14 IU/L (ref 0–32)
AST: 14 IU/L (ref 0–40)
Alkaline Phosphatase: 106 IU/L (ref 39–117)
BILIRUBIN TOTAL: 1.1 mg/dL (ref 0.0–1.2)
BUN / CREAT RATIO: 22 (ref 12–28)
BUN: 22 mg/dL (ref 8–27)
CHLORIDE: 103 mmol/L (ref 96–106)
CO2: 22 mmol/L (ref 18–29)
Calcium: 10 mg/dL (ref 8.7–10.3)
Creatinine, Ser: 1 mg/dL (ref 0.57–1.00)
GFR calc non Af Amer: 60 mL/min/{1.73_m2} (ref >59)
GFR, EST AFRICAN AMERICAN: 69 mL/min/{1.73_m2} (ref >59)
Globulin, Total: 2.4 g/dL (ref 1.5–4.5)
Glucose: 74 mg/dL (ref 65–99)
POTASSIUM: 4 mmol/L (ref 3.5–5.2)
Sodium: 143 mmol/L (ref 134–144)
TOTAL PROTEIN: 6.9 g/dL (ref 6.0–8.5)

## 2016-04-10 LAB — LIPID PANEL
Chol/HDL Ratio: 3.3 ratio units (ref 0.0–4.4)
Cholesterol, Total: 173 mg/dL (ref 100–199)
HDL: 52 mg/dL (ref >39)
LDL Calculated: 77 mg/dL (ref 0–99)
Triglycerides: 221 mg/dL — ABNORMAL HIGH (ref 0–149)
VLDL CHOLESTEROL CAL: 44 mg/dL — AB (ref 5–40)

## 2016-04-10 LAB — HEMOGLOBIN A1C
Est. average glucose Bld gHb Est-mCnc: 105 mg/dL
Hgb A1c MFr Bld: 5.3 % (ref 4.8–5.6)

## 2016-04-16 ENCOUNTER — Ambulatory Visit: Payer: Self-pay | Admitting: Urology

## 2016-04-16 VITALS — BP 129/70 | HR 66 | Temp 98.5°F | Wt 141.0 lb

## 2016-04-16 DIAGNOSIS — R600 Localized edema: Secondary | ICD-10-CM

## 2016-04-16 DIAGNOSIS — I10 Essential (primary) hypertension: Secondary | ICD-10-CM

## 2016-04-16 DIAGNOSIS — R7309 Other abnormal glucose: Secondary | ICD-10-CM

## 2016-04-16 DIAGNOSIS — E782 Mixed hyperlipidemia: Secondary | ICD-10-CM

## 2016-04-16 NOTE — Progress Notes (Signed)
   Subjective:    Patient ID: Tonya Cabrera, female    DOB: [birth year], 65 y.o.   MRN: [id number]  Hypertension This is a chronic problem. The problem has been rapidly improving since onset. The problem is controlled. Pertinent negatives include no anxiety, blurred vision, chest pain, headaches, malaise/fatigue, neck pain, orthopnea, palpitations, peripheral edema, PND, shortness of breath or sweats. There are no associated agents to hypertension. Risk factors for coronary artery disease include dyslipidemia. Past treatments include ACE inhibitors and beta blockers. The current treatment provides significant improvement.  Hyperlipidemia Pertinent negatives include no chest pain or shortness of breath.   Edema is improving.  She takes her Lasix once a week.    Labs are good.     Review of Systems  Constitutional: Negative for chills, malaise/fatigue and appetite change.  Eyes: Negative for blurred vision.  Respiratory: Negative for shortness of breath.   Cardiovascular: Positive for leg swelling. Negative for chest pain, palpitations, orthopnea and PND.  Endocrine: Negative for cold intolerance and polydipsia.  Genitourinary: Negative for decreased urine volume.  Musculoskeletal: Negative for neck pain.  Neurological: Negative for tremors, weakness and headaches.       Objective:   Physical Exam  Constitutional: She is oriented to person, place, and time.  Neck: No thyromegaly present.  Cardiovascular: Normal rate, regular rhythm and normal heart sounds.   Carotid bruits not present  Pulmonary/Chest: She has no wheezes. She has no rales.  BBrS diminished mildly as expected in a former smoker  Musculoskeletal: She exhibits edema.  BLE edema, ending below the knee, L>R, approx 1+ at max.    Lymphadenopathy:    She has no cervical adenopathy.  Neurological: She is alert and oriented to person, place, and time.  Psychiatric: She has a normal mood and affect.  Nursing note  reviewed.         Assessment & Plan:  Essential hypertension  Good control  CMET- WNL  Bilateral edema  Only taking Lasix once a week  Abnormal glucose  HbgA1c normal  Mixed HLD  Labs improved  F/U in 4 months

## 2016-06-08 ENCOUNTER — Ambulatory Visit (INDEPENDENT_AMBULATORY_CARE_PROVIDER_SITE_OTHER): Payer: PPO | Admitting: Family Medicine

## 2016-06-08 ENCOUNTER — Encounter: Payer: Self-pay | Admitting: Family Medicine

## 2016-06-08 VITALS — BP 182/85 | HR 67 | Temp 97.7°F | Wt 139.0 lb

## 2016-06-08 DIAGNOSIS — Z1231 Encounter for screening mammogram for malignant neoplasm of breast: Secondary | ICD-10-CM | POA: Diagnosis not present

## 2016-06-08 DIAGNOSIS — Z13820 Encounter for screening for osteoporosis: Secondary | ICD-10-CM | POA: Diagnosis not present

## 2016-06-08 DIAGNOSIS — R0683 Snoring: Secondary | ICD-10-CM

## 2016-06-08 DIAGNOSIS — I2510 Atherosclerotic heart disease of native coronary artery without angina pectoris: Secondary | ICD-10-CM | POA: Insufficient documentation

## 2016-06-08 DIAGNOSIS — E785 Hyperlipidemia, unspecified: Secondary | ICD-10-CM | POA: Diagnosis not present

## 2016-06-08 DIAGNOSIS — I25119 Atherosclerotic heart disease of native coronary artery with unspecified angina pectoris: Secondary | ICD-10-CM | POA: Diagnosis not present

## 2016-06-08 DIAGNOSIS — I10 Essential (primary) hypertension: Secondary | ICD-10-CM

## 2016-06-08 DIAGNOSIS — Z1211 Encounter for screening for malignant neoplasm of colon: Secondary | ICD-10-CM | POA: Diagnosis not present

## 2016-06-08 MED ORDER — LISINOPRIL 10 MG PO TABS: 10.0000 mg | ORAL_TABLET | Freq: Every day | ORAL | 1 refills | Status: DC

## 2016-06-08 MED ORDER — FUROSEMIDE 20 MG PO TABS: 20.0000 mg | ORAL_TABLET | ORAL | 1 refills | Status: DC

## 2016-06-08 MED ORDER — ATORVASTATIN CALCIUM 40 MG PO TABS: 40.0000 mg | ORAL_TABLET | Freq: Every day | ORAL | 1 refills | Status: DC

## 2016-06-08 MED ORDER — METOPROLOL SUCCINATE ER 25 MG PO TB24: 25.0000 mg | ORAL_TABLET | Freq: Two times a day (BID) | ORAL | 1 refills | Status: DC

## 2016-06-08 MED ORDER — CLOPIDOGREL BISULFATE 75 MG PO TABS: 75.0000 mg | ORAL_TABLET | Freq: Every day | ORAL | 1 refills | Status: DC

## 2016-06-08 MED ORDER — ASPIRIN EC 81 MG PO TBEC: 81.0000 mg | DELAYED_RELEASE_TABLET | Freq: Every day | ORAL | 4 refills | Status: DC

## 2016-06-08 NOTE — Assessment & Plan Note (Signed)
Concern for OSA, will arrange for sleep study. Await results.

## 2016-06-08 NOTE — Assessment & Plan Note (Signed)
Has been off meds. Will check labs. Refill given. Recheck 1 month.

## 2016-06-08 NOTE — Assessment & Plan Note (Signed)
Still occasional chest pain. Not in months. Continue medical management- rechecking next visit. Continue plavix. Continue to monitor.

## 2016-06-08 NOTE — Progress Notes (Signed)
BP (!) 182/85   Pulse 67   Temp 97.7 F (36.5 C)   Wt 139 lb (63 kg)   SpO2 100%   BMI 25.02 kg/m    Subjective:    Patient ID: Tonya Cabrera, female    DOB: [birth year], 65 y.o.   MRN: [id number]  HPI: Tonya Cabrera is a 65 y.o. female who presents today after being lost to follow up for 2 years, [facility].   Chief Complaint  Patient presents with  . Hyperlipidemia  . Hypertension   Had a heart attack 2 years ago in August.   HYPERTENSION / HYPERLIPIDEMIA- has been out of her medicine for over a week.  Satisfied with current treatment? no Duration of hypertension: chronic BP monitoring frequency: not checking BP medication side effects: no Past BP meds: metoprolol and lisinopril and lasix Duration of hyperlipidemia: chronic Cholesterol medication side effects: no Cholesterol supplements: none Past cholesterol medications: atorvastatin Medication compliance: fair complaince Aspirin: yes Recent stressors: no Recurrent headaches: no Visual changes: no Palpitations: yes- occasionally  Dyspnea: no Chest pain: yes- occasionally usually with over extertion- last time was months ago Lower extremity edema: yes Dizzy/lightheaded: no  INSOMNIA Duration: chronic Satisfied with sleep quality: no Difficulty falling asleep: yes Difficulty staying asleep: yes Waking a few hours after sleep onset: yes Early morning awakenings: yes Daytime hypersomnolence: yes Wakes feeling refreshed: no Good sleep hygiene: yes Apnea: no Snoring: yes Depressed/anxious mood: yes Recent stress: no Restless legs/nocturnal leg cramps: no Chronic pain/arthritis: no History of sleep study: no Treatments attempted: none   Relevant past medical, surgical, family and social history reviewed and updated as indicated. Interim medical history since our last visit reviewed. Allergies and medications reviewed and updated.  Review of Systems    Constitutional: Negative.   Respiratory: Negative.  Negative for apnea, cough, choking, chest tightness, shortness of breath, wheezing and stridor.   Cardiovascular: Positive for chest pain and palpitations. Negative for leg swelling.  Psychiatric/Behavioral: Positive for sleep disturbance. Negative for agitation, behavioral problems, confusion, decreased concentration, dysphoric mood, hallucinations, self-injury and suicidal ideas. The patient is not nervous/anxious and is not hyperactive.     Per HPI unless specifically indicated above     Objective:    BP (!) 182/85   Pulse 67   Temp 97.7 F (36.5 C)   Wt 139 lb (63 kg)   SpO2 100%   BMI 25.02 kg/m   Wt Readings from Last 3 Encounters:  06/08/16 139 lb (63 kg)  04/16/16 141 lb (64 kg)  12/12/15 142 lb (64.4 kg)    Physical Exam  Constitutional: She is oriented to person, place, and time. She appears well-developed and well-nourished. No distress.  HENT:  Head: Normocephalic and atraumatic.  Right Ear: Hearing normal.  Left Ear: Hearing normal.  Nose: Nose normal.  Eyes: Conjunctivae and lids are normal. Right eye exhibits no discharge. Left eye exhibits no discharge. No scleral icterus.  Cardiovascular: Normal rate, regular rhythm, normal heart sounds and intact distal pulses.  Exam reveals no gallop and no friction rub.   No murmur heard. Pulmonary/Chest: Effort normal and breath sounds normal. No respiratory distress. She has no wheezes. She has no rales. She exhibits no tenderness.  Musculoskeletal: Normal range of motion.  Neurological: She is alert and oriented to person, place, and time.  Skin: Skin is warm, dry and intact. No rash noted. No erythema. No pallor.  Psychiatric: She has  a normal mood and affect. Her speech is normal and behavior is normal. Judgment and thought content normal. Cognition and memory are normal.  Nursing note and vitals reviewed.   Results for orders placed or performed in visit on  04/09/16  HgB A1c  Result Value Ref Range   Hgb A1c MFr Bld 5.3 4.8 - 5.6 %   Est. average glucose Bld gHb Est-mCnc 105 mg/dL  Comp Met (CMET)  Result Value Ref Range   Glucose 74 65 - 99 mg/dL   BUN 22 8 - 27 mg/dL   Creatinine, Ser 1.00 0.57 - 1.00 mg/dL   GFR calc non Af Amer 60 >59 mL/min/1.73   GFR calc Af Amer 69 >59 mL/min/1.73   BUN/Creatinine Ratio 22 12 - 28   Sodium 143 134 - 144 mmol/L   Potassium 4.0 3.5 - 5.2 mmol/L   Chloride 103 96 - 106 mmol/L   CO2 22 18 - 29 mmol/L   Calcium 10.0 8.7 - 10.3 mg/dL   Total Protein 6.9 6.0 - 8.5 g/dL   Albumin 4.5 3.6 - 4.8 g/dL   Globulin, Total 2.4 1.5 - 4.5 g/dL   Albumin/Globulin Ratio 1.9 1.2 - 2.2   Bilirubin Total 1.1 0.0 - 1.2 mg/dL   Alkaline Phosphatase 106 39 - 117 IU/L   AST 14 0 - 40 IU/L   ALT 14 0 - 32 IU/L  Lipid Profile  Result Value Ref Range   Cholesterol, Total 173 100 - 199 mg/dL   Triglycerides 221 (H) 0 - 149 mg/dL   HDL 52 >39 mg/dL   VLDL Cholesterol Cal 44 (H) 5 - 40 mg/dL   LDL Calculated 77 0 - 99 mg/dL   Chol/HDL Ratio 3.3 0.0 - 4.4 ratio units      Assessment & Plan:   Problem List Items Addressed This Visit      Cardiovascular and Mediastinum   Essential hypertension - Primary    Not under good control, but not on her medicine. Will restart meds and recheck in 1 month.       Relevant Medications   metoprolol succinate (TOPROL-XL) 25 MG 24 hr tablet   lisinopril (PRINIVIL,ZESTRIL) 10 MG tablet   atorvastatin (LIPITOR) 40 MG tablet   aspirin EC 81 MG tablet   furosemide (LASIX) 20 MG tablet   Other Relevant Orders   CBC with Differential/Platelet   Comprehensive metabolic panel   Lipid Panel Piccolo, Waived   Microalbumin, Urine Waived   TSH   UA/M w/rflx Culture, Routine   Ambulatory referral to Cardiology   CAD (coronary artery disease)    Still occasional chest pain. Not in months. Continue medical management- rechecking next visit. Continue plavix. Continue to monitor.         Relevant Medications   metoprolol succinate (TOPROL-XL) 25 MG 24 hr tablet   lisinopril (PRINIVIL,ZESTRIL) 10 MG tablet   atorvastatin (LIPITOR) 40 MG tablet   aspirin EC 81 MG tablet   furosemide (LASIX) 20 MG tablet   Other Relevant Orders   Ambulatory referral to Cardiology     Other   Hyperlipidemia    Has been off meds. Will check labs. Refill given. Recheck 1 month.       Relevant Medications   metoprolol succinate (TOPROL-XL) 25 MG 24 hr tablet   lisinopril (PRINIVIL,ZESTRIL) 10 MG tablet   atorvastatin (LIPITOR) 40 MG tablet   aspirin EC 81 MG tablet   furosemide (LASIX) 20 MG tablet   Other Relevant Orders  CBC with Differential/Platelet   Comprehensive metabolic panel   Lipid Panel Piccolo, Waived   Microalbumin, Urine Waived   TSH   UA/M w/rflx Culture, Routine   Ambulatory referral to Cardiology   Snoring    Concern for OSA, will arrange for sleep study. Await results.       Relevant Orders   Ambulatory referral to Sleep Studies    Other Visit Diagnoses    Screening for colon cancer       Referral generated today. Await results.    Encounter for screening mammogram for breast cancer       Mammogram ordered today. Await results.    Relevant Orders   MM Digital Screening   Encounter for screening for osteoporosis       DEXA ordered today. Await results.    Relevant Orders   DG Bone Density       Follow up plan: Return in about 4 weeks (around 07/06/2016) for Welcome to Medicare.

## 2016-06-08 NOTE — Assessment & Plan Note (Signed)
Not under good control, but not on her medicine. Will restart meds and recheck in 1 month.

## 2016-06-09 ENCOUNTER — Encounter: Payer: Self-pay | Admitting: Family Medicine

## 2016-06-09 LAB — CBC WITH DIFFERENTIAL/PLATELET
Basophils Absolute: 0 10*3/uL (ref 0.0–0.2)
Basos: 0 %
EOS (ABSOLUTE): 0.1 10*3/uL (ref 0.0–0.4)
EOS: 1 %
HEMATOCRIT: 37.5 % (ref 34.0–46.6)
HEMOGLOBIN: 12.9 g/dL (ref 11.1–15.9)
IMMATURE GRANS (ABS): 0 10*3/uL (ref 0.0–0.1)
IMMATURE GRANULOCYTES: 0 %
Lymphocytes Absolute: 1.7 10*3/uL (ref 0.7–3.1)
Lymphs: 30 %
MCH: 30.6 pg (ref 26.6–33.0)
MCHC: 34.4 g/dL (ref 31.5–35.7)
MCV: 89 fL (ref 79–97)
MONOCYTES: 8 %
Monocytes Absolute: 0.4 10*3/uL (ref 0.1–0.9)
NEUTROS PCT: 61 %
Neutrophils Absolute: 3.4 10*3/uL (ref 1.4–7.0)
Platelets: 206 10*3/uL (ref 150–379)
RBC: 4.22 x10E6/uL (ref 3.77–5.28)
RDW: 14.1 % (ref 12.3–15.4)
WBC: 5.6 10*3/uL (ref 3.4–10.8)

## 2016-06-09 LAB — COMPREHENSIVE METABOLIC PANEL
ALBUMIN: 4.5 g/dL (ref 3.6–4.8)
ALT: 19 IU/L (ref 0–32)
AST: 18 IU/L (ref 0–40)
Albumin/Globulin Ratio: 2 (ref 1.2–2.2)
Alkaline Phosphatase: 110 IU/L (ref 39–117)
BUN / CREAT RATIO: 18 (ref 12–28)
BUN: 15 mg/dL (ref 8–27)
Bilirubin Total: 1.2 mg/dL (ref 0.0–1.2)
CALCIUM: 9.4 mg/dL (ref 8.7–10.3)
CO2: 21 mmol/L (ref 18–29)
CREATININE: 0.85 mg/dL (ref 0.57–1.00)
Chloride: 104 mmol/L (ref 96–106)
GFR calc Af Amer: 83 mL/min/{1.73_m2} (ref >59)
GFR, EST NON AFRICAN AMERICAN: 72 mL/min/{1.73_m2} (ref >59)
GLOBULIN, TOTAL: 2.2 g/dL (ref 1.5–4.5)
Glucose: 93 mg/dL (ref 65–99)
Potassium: 4.4 mmol/L (ref 3.5–5.2)
SODIUM: 142 mmol/L (ref 134–144)
Total Protein: 6.7 g/dL (ref 6.0–8.5)

## 2016-06-09 LAB — TSH: TSH: 2.93 u[IU]/mL (ref 0.450–4.500)

## 2016-06-10 LAB — MICROALBUMIN, URINE WAIVED
CREATININE, URINE WAIVED: 50 mg/dL (ref 10–300)
MICROALB, UR WAIVED: 30 mg/L — AB (ref 0–19)

## 2016-06-10 LAB — MICROSCOPIC EXAMINATION

## 2016-06-10 LAB — UA/M W/RFLX CULTURE, ROUTINE
BILIRUBIN UA: NEGATIVE
Glucose, UA: NEGATIVE
KETONES UA: NEGATIVE
Nitrite, UA: NEGATIVE
PH UA: 6 (ref 5.0–7.5)
Protein, UA: NEGATIVE
Specific Gravity, UA: 1.01 (ref 1.005–1.030)
UUROB: 0.2 mg/dL (ref 0.2–1.0)

## 2016-06-10 LAB — LIPID PANEL PICCOLO, WAIVED
CHOL/HDL RATIO PICCOLO,WAIVE: 3.7 mg/dL
CHOLESTEROL PICCOLO, WAIVED: 235 mg/dL — AB (ref <200)
HDL Chol Piccolo, Waived: 63 mg/dL (ref >59)
LDL CHOL CALC PICCOLO WAIVED: 143 mg/dL — AB (ref <100)
TRIGLYCERIDES PICCOLO,WAIVED: 147 mg/dL (ref <150)
VLDL Chol Calc Piccolo,Waive: 29 mg/dL (ref <30)

## 2016-06-10 LAB — URINE CULTURE, REFLEX

## 2016-06-11 ENCOUNTER — Telehealth: Payer: Self-pay | Admitting: Family Medicine

## 2016-06-11 MED ORDER — NITROFURANTOIN MONOHYD MACRO 100 MG PO CAPS: 100.0000 mg | ORAL_CAPSULE | Freq: Two times a day (BID) | ORAL | 0 refills | Status: DC

## 2016-06-11 NOTE — Telephone Encounter (Signed)
Called and let her know that her urine grew out bacteria. Meds sent to her pharmacy

## 2016-06-15 NOTE — Progress Notes (Signed)
[name] Consultation      Assessment and Plan:  Excessive daytime sleepiness.  --Symptoms and sign of OSA, will send for sleep study.   Insomnia.  --Sleep initiation and sleep maintenance type, will start ambien 5 mg qhs.   Essential hypertension.  --Sleep apnea can contribute to hypertension, will continue to monitor.    Date: 06/15/2016  MRN# [id number] Tonya Cabrera 1950-11-20  Referring Physician:   MIYOSHI Cabrera is a 64 y.o. old female seen in consultation for chief complaint of:    Chief Complaint  Patient presents with  . sleep consult    Per [place] [facility].  pt c/o loud snoring, restless sleep, daytime sleepiness, trouble falling alseep. EPWORTH:4    HPI:   The patient is a 65 yo female with a history of CAD, HTN, now referred with complaints of excessive daytime sleepiness.  Patient notes that she typically goes to bed at 10 and 10:30 PM, she notes that it takes anywhere from one to several hours to fall asleep. She wakes up several times per night, gets out of bed at 7 AM. She works part time.   She notes that she has trouble falling asleep. This has been going on for several years, she notes that she wakes herself up snoring. She has tried several OTC meds including tylenol PM, unasom, melatonin. Most helped her fall asleep to some degree, but she does not stay asleep.  She had a friend who had [place] and she tried it and she felt that it worked very well.   She sleeps with her dog. She notes that she has been teased about loud snoring since she was a teenager, and her former husband told her that she snored loudly.  She does not take naps during the day.    She smoked about 1 ppd until her heart attack in 2015.    PMHX:   Past Medical History:  Diagnosis Date  . Hypertension   . Myocardial infarction ([place])    2015  . Stroke [facility])    10-12 years ago   Surgical Hx:  No past surgical history on file. Family Hx:  Family History    Problem Relation Age of Onset  . Liver cancer Brother   . Heart Problems Mother    Social Hx:   Social History  Substance Use Topics  . Smoking status: Former Smoker    Quit date: 11/13/2013  . Smokeless tobacco: Never Used     Comment: Smoked over 25 years, quit after Heart Attack in 2015.    Marland Kitchen Alcohol use 0.0 oz/week   Medication:   Reviewed.     Allergies:  Review of patient's allergies indicates no known allergies.  Review of Systems: Gen:  Denies  fever, sweats, chills HEENT: Denies blurred vision, double vision. bleeds, sore throat Cvc:  No dizziness, chest pain. Resp:   Denies cough or sputum production, shortness of breath Gi: Denies swallowing difficulty, stomach pain. Gu:  Denies bladder incontinence, burning urine Ext:   No Joint pain, stiffness. Skin: No skin rash,  hives  Endoc:  No polyuria, polydipsia. Psych: No depression, insomnia. Other:  All other systems were reviewed with the patient and were negative other that what is mentioned in the HPI.   Physical Examination:   VS: There were no vitals taken for this visit.  General Appearance: No distress  Neuro:without focal findings,  speech normal,  HEENT: PERRLA, EOM intact.   Pulmonary: normal breath sounds, No  wheezing.  CardiovascularNormal S1,S2.  No m/r/g.   Abdomen: Benign, Soft, non-tender. Renal:  No costovertebral tenderness  GU:  No performed at this time. Endoc: No evident thyromegaly, no signs of acromegaly. Skin:   warm, no rashes, no ecchymosis  Extremities: normal, no cyanosis, clubbing.  Other findings:    LABORATORY PANEL:   CBC  Recent Labs Lab 06/08/16 0953  WBC 5.6  HCT 37.5  PLT 206   ------------------------------------------------------------------------------------------------------------------  Chemistries   Recent Labs Lab 06/08/16 0953  NA 142  K 4.4  CL 104  CO2 21  GLUCOSE 93  BUN 15  CREATININE 0.85  CALCIUM 9.4  AST 18  ALT 19  ALKPHOS 110   BILITOT 1.2   ------------------------------------------------------------------------------------------------------------------  Cardiac Enzymes No results for input(s): TROPONINI in the last 168 hours. ------------------------------------------------------------  RADIOLOGY:  No results found.     Thank  you for the consultation and for allowing [facility] to assist in the care of your patient. Our recommendations are noted above.  Please contact us if we can be of further service.   [name], MD.  Board Certified in Internal Medicine, Pulmonary Medicine, [place], and Sleep Medicine.  [facility] Office Number: [phone]  [name], M.D.  [name], M.D.  [name], M.D  06/15/2016

## 2016-06-16 ENCOUNTER — Encounter: Payer: Self-pay | Admitting: Family Medicine

## 2016-06-16 ENCOUNTER — Ambulatory Visit (INDEPENDENT_AMBULATORY_CARE_PROVIDER_SITE_OTHER): Payer: PPO | Admitting: Internal Medicine

## 2016-06-16 ENCOUNTER — Encounter (INDEPENDENT_AMBULATORY_CARE_PROVIDER_SITE_OTHER): Payer: Self-pay

## 2016-06-16 ENCOUNTER — Ambulatory Visit (INDEPENDENT_AMBULATORY_CARE_PROVIDER_SITE_OTHER): Payer: PPO | Admitting: Family Medicine

## 2016-06-16 ENCOUNTER — Encounter: Payer: Self-pay | Admitting: Internal Medicine

## 2016-06-16 VITALS — BP 128/68 | HR 65 | Ht 62.5 in | Wt 138.0 lb

## 2016-06-16 VITALS — BP 122/71 | HR 62 | Temp 98.0°F | Ht 63.3 in | Wt 137.0 lb

## 2016-06-16 DIAGNOSIS — N3000 Acute cystitis without hematuria: Secondary | ICD-10-CM

## 2016-06-16 DIAGNOSIS — H5213 Myopia, bilateral: Secondary | ICD-10-CM | POA: Diagnosis not present

## 2016-06-16 DIAGNOSIS — I10 Essential (primary) hypertension: Secondary | ICD-10-CM

## 2016-06-16 DIAGNOSIS — Z87891 Personal history of nicotine dependence: Secondary | ICD-10-CM

## 2016-06-16 DIAGNOSIS — E785 Hyperlipidemia, unspecified: Secondary | ICD-10-CM

## 2016-06-16 DIAGNOSIS — G4719 Other hypersomnia: Secondary | ICD-10-CM

## 2016-06-16 DIAGNOSIS — I25119 Atherosclerotic heart disease of native coronary artery with unspecified angina pectoris: Secondary | ICD-10-CM | POA: Diagnosis not present

## 2016-06-16 DIAGNOSIS — Z0000 Encounter for general adult medical examination without abnormal findings: Secondary | ICD-10-CM

## 2016-06-16 DIAGNOSIS — Z1159 Encounter for screening for other viral diseases: Secondary | ICD-10-CM

## 2016-06-16 DIAGNOSIS — Z1211 Encounter for screening for malignant neoplasm of colon: Secondary | ICD-10-CM

## 2016-06-16 DIAGNOSIS — Z114 Encounter for screening for human immunodeficiency virus [HIV]: Secondary | ICD-10-CM | POA: Diagnosis not present

## 2016-06-16 DIAGNOSIS — W57XXXD Bitten or stung by nonvenomous insect and other nonvenomous arthropods, subsequent encounter: Secondary | ICD-10-CM | POA: Diagnosis not present

## 2016-06-16 DIAGNOSIS — S30861D Insect bite (nonvenomous) of abdominal wall, subsequent encounter: Secondary | ICD-10-CM

## 2016-06-16 DIAGNOSIS — R0683 Snoring: Secondary | ICD-10-CM

## 2016-06-16 LAB — UA/M W/RFLX CULTURE, ROUTINE
BILIRUBIN UA: NEGATIVE
Glucose, UA: NEGATIVE
Ketones, UA: NEGATIVE
Leukocytes, UA: NEGATIVE
Nitrite, UA: NEGATIVE
PH UA: 5.5 (ref 5.0–7.5)
Protein, UA: NEGATIVE
Specific Gravity, UA: 1.02 (ref 1.005–1.030)
UUROB: 1 mg/dL (ref 0.2–1.0)

## 2016-06-16 LAB — MICROSCOPIC EXAMINATION

## 2016-06-16 MED ORDER — ZOLPIDEM TARTRATE 5 MG PO TABS: 5.0000 mg | ORAL_TABLET | Freq: Every day | ORAL | 5 refills | Status: DC

## 2016-06-16 NOTE — Addendum Note (Signed)
Addended by: Maryanna Shape A on: 06/16/2016 09:57 AM   Modules accepted: Orders

## 2016-06-16 NOTE — Assessment & Plan Note (Signed)
Back on meds. Labs checked today. Under good control. Continue to monitor. Recheck 6 months.

## 2016-06-16 NOTE — Assessment & Plan Note (Signed)
Stable. EKG normal. Continue medical management. Continue plavix. Continue to monitor.

## 2016-06-16 NOTE — Patient Instructions (Signed)
--  Start ambien 5 mg every night.

## 2016-06-16 NOTE — Assessment & Plan Note (Signed)
Seeing pulmonology. Going to have a sleep study.

## 2016-06-16 NOTE — Assessment & Plan Note (Signed)
Referral to opthalmology made today.

## 2016-06-16 NOTE — Progress Notes (Signed)
BP 122/71 (BP Location: Left Arm, Patient Position: Sitting, Cuff Size: Normal)   Pulse 62   Temp 98 F (36.7 C)   Ht 5' 3.3" (1.608 m)   Wt 137 lb (62.1 kg)   SpO2 99%   BMI 24.04 kg/m    Subjective:    Patient ID: Tonya Cabrera, female    DOB: 21-Nov-1950, 65 y.o.   MRN: [id number]  HPI: Tonya Cabrera is a 64 y.o. female presenting on 06/16/2016 for comprehensive medical examination. Current medical complaints include:  HYPERTENSION / HYPERLIPIDEMIA Satisfied with current treatment? yes Duration of hypertension: chronic BP monitoring frequency: not checking BP medication side effects: no Duration of hyperlipidemia: chronic Cholesterol medication side effects: no Cholesterol supplements: none Past cholesterol medications: lipitor Medication compliance: good compliance Aspirin: yes Recent stressors: no Recurrent headaches: no Visual changes: no Palpitations: no Dyspnea: no Chest pain: yes Lower extremity edema: no Dizzy/lightheaded: no  She currently lives with: daughter and her husband Menopausal Symptoms: no  Functional Status Survey: Is the patient deaf or have difficulty hearing?: Yes Does the patient have difficulty seeing, even when wearing glasses/contacts?: Yes Does the patient have difficulty concentrating, remembering, or making decisions?: Yes Does the patient have difficulty walking or climbing stairs?: No Does the patient have difficulty dressing or bathing?: No Does the patient have difficulty doing errands alone such as visiting a doctor's office or shopping?: No No flowsheet data found.  Depression Screen Depression screen [facility] 2/9 06/08/2016  Decreased Interest 0  Down, Depressed, Hopeless 0  PHQ - 2 Score 0   Advanced Directives Does patient have a HCPOA?    no Does patient have a living will or MOST form?  no  Past Medical History:  Past Medical History:  Diagnosis Date  . Hypertension   . Myocardial infarction ([place])    2015  .  Stroke [facility])    10-12 years ago    Surgical History:  History reviewed. No pertinent surgical history.  Medications:  Current Outpatient Prescriptions on File Prior to Visit  Medication Sig  . aspirin EC 81 MG tablet Take 1 tablet (81 mg total) by mouth daily.  Marland Kitchen atorvastatin (LIPITOR) 40 MG tablet Take 1 tablet (40 mg total) by mouth daily.  . clopidogrel (PLAVIX) 75 MG tablet Take 1 tablet (75 mg total) by mouth daily.  . furosemide (LASIX) 20 MG tablet Take 1 tablet (20 mg total) by mouth every other day.  . lisinopril (PRINIVIL,ZESTRIL) 10 MG tablet Take 1 tablet (10 mg total) by mouth daily.  . metoprolol succinate (TOPROL-XL) 25 MG 24 hr tablet Take 1 tablet (25 mg total) by mouth 2 (two) times daily.  . nitrofurantoin, macrocrystal-monohydrate, (MACROBID) 100 MG capsule Take 1 capsule (100 mg total) by mouth 2 (two) times daily.   No current facility-administered medications on file prior to visit.     Allergies:  No Known Allergies  Social History:  Social History   Social History  . Marital status: Single    Spouse name: N/A  . Number of children: N/A  . Years of education: N/A   Occupational History  . Not on file.   Social History Main Topics  . Smoking status: Former Smoker    Packs/day: 1.00    Years: 20.00    Quit date: 05/08/2014  . Smokeless tobacco: Never Used     Comment: Smoked over 25 years, quit after Heart Attack in 2015.    Marland Kitchen Alcohol use 0.0 oz/week  . Drug  use: No  . Sexual activity: Not on file   Other Topics Concern  . Not on file   Social History Narrative  . No narrative on file   History  Smoking Status  . Former Smoker  . Packs/day: 1.00  . Years: 20.00  . Quit date: 05/08/2014  Smokeless Tobacco  . Never Used    Comment: Smoked over 25 years, quit after Heart Attack in 2015.     History  Alcohol Use  . 0.0 oz/week    Family History:  Family History  Problem Relation Age of Onset  . Liver cancer Brother   . Heart  Problems Mother     Past medical history, surgical history, medications, allergies, family history and social history reviewed with patient today and changes made to appropriate areas of the chart.   Review of Systems  Constitutional: Negative.   HENT: Positive for congestion. Negative for ear discharge, ear pain, hearing loss, nosebleeds, sore throat and tinnitus.   Eyes: Positive for blurred vision. Negative for double vision, photophobia, pain, discharge and redness.  Respiratory: Negative.  Negative for stridor.   Cardiovascular: Positive for chest pain (with stress and over exertion). Negative for palpitations, orthopnea, claudication, leg swelling and PND.  Gastrointestinal: Positive for diarrhea. Negative for abdominal pain, blood in stool, constipation, heartburn, melena, nausea and vomiting.  Genitourinary: Negative.   Musculoskeletal: Negative.   Skin: Negative.        Had a tick bite about 2 years ago on her belly that never seemed to heal  Neurological: Positive for tingling (in her hands bilaterally occasionally). Negative for dizziness, tremors, sensory change, speech change, focal weakness, seizures, loss of consciousness and headaches.  Endo/Heme/Allergies: Negative for environmental allergies and polydipsia. Bruises/bleeds easily.  Psychiatric/Behavioral: Negative for depression, hallucinations, memory loss, substance abuse and suicidal ideas. The patient is nervous/anxious and has insomnia.     All other ROS negative except what is listed above and in the HPI.      Objective:    BP 122/71 (BP Location: Left Arm, Patient Position: Sitting, Cuff Size: Normal)   Pulse 62   Temp 98 F (36.7 C)   Ht 5' 3.3" (1.608 m)   Wt 137 lb (62.1 kg)   SpO2 99%   BMI 24.04 kg/m   Wt Readings from Last 3 Encounters:  06/16/16 137 lb (62.1 kg)  06/16/16 138 lb (62.6 kg)  06/08/16 139 lb (63 kg)     Hearing Screening   125Hz  250Hz  500Hz  1000Hz  2000Hz  3000Hz  4000Hz  6000Hz  8000Hz    Right ear:   20 20 20   Fail    Left ear:   20 20 20   Fail      Visual Acuity Screening   Right eye Left eye Both eyes  Without correction:     With correction: 20/70 20/50 20/50    Physical Exam  Constitutional: She is oriented to person, place, and time. She appears well-developed and well-nourished. No distress.  HENT:  Head: Normocephalic and atraumatic.  Right Ear: Hearing and external ear normal.  Left Ear: Hearing and external ear normal.  Nose: Nose normal.  Mouth/Throat: Oropharynx is clear and moist. No oropharyngeal exudate.  Eyes: Conjunctivae, EOM and lids are normal. Pupils are equal, round, and reactive to light. Right eye exhibits no discharge. Left eye exhibits no discharge. No scleral icterus.  Neck: Normal range of motion. Neck supple. No JVD present. No tracheal deviation present. No thyromegaly present.  Cardiovascular: Normal rate, regular rhythm and intact distal  pulses.  Exam reveals no gallop and no friction rub.   Murmur heard. Pulmonary/Chest: Effort normal and breath sounds normal. No stridor. No respiratory distress. She has no wheezes. She has no rales. She exhibits no tenderness.  Abdominal: Soft. Bowel sounds are normal. She exhibits no distension and no mass. There is no tenderness. There is no rebound and no guarding.  Genitourinary:  Genitourinary Comments: Deferred with shared decision making  Musculoskeletal: Normal range of motion. She exhibits no edema, tenderness or deformity.  Lymphadenopathy:    She has no cervical adenopathy.  Neurological: She is alert and oriented to person, place, and time. She has normal reflexes. She displays normal reflexes. No cranial nerve deficit. She exhibits normal muscle tone. Coordination normal.  Skin: Skin is warm, dry and intact. No rash noted. She is not diaphoretic. No erythema. No pallor.  Scar on RLQ of belly  Psychiatric: She has a normal mood and affect. Her speech is normal and behavior is normal.  Judgment and thought content normal. Cognition and memory are normal.  Nursing note and vitals reviewed.   Cognitive Testing - 6-CIT  Correct? Score   What year is it? yes 0 Yes = 0    No = 4  What month is it? yes 0 Yes = 0    No = 3  Remember:     [address]ia [address], [place], [place]     What time is it? yes 0 Yes = 0    No = 3  Count backwards from 20 to 1 yes 0 Correct = 0    1 error = 2   More than 1 error = 4  Say the months of the year in reverse. yes 0 Correct = 0    1 error = 2   More than 1 error = 4  What address did I ask you to remember? yes 0 Correct = 0  1 error = 2    2 error = 4    3 error = 6    4 error = 8    All wrong = 10       TOTAL SCORE  0/28   Interpretation:  Normal  Normal (0-7) Abnormal (8-28)   Results for orders placed or performed in visit on 06/16/16  HM PAP SMEAR  Result Value Ref Range   HM Pap smear negative       Assessment & Plan:   Problem List Items Addressed This Visit      Cardiovascular and Mediastinum   Essential hypertension    Back on meds. Labs checked today. Under good control. Continue to monitor. Recheck 6 months.       Relevant Orders   UA/M w/rflx Culture, Routine   Comprehensive metabolic panel   CAD (coronary artery disease)    Stable. EKG normal. Continue medical management. Continue plavix. Continue to monitor.       Relevant Orders   EKG 12-Lead (Completed)     Other   Hyperlipidemia    Back on meds. Labs checked today. Under good control. Continue to monitor. Recheck 6 months.       Relevant Orders   EKG 12-Lead (Completed)   Lipid Panel w/o Chol/HDL Ratio   Comprehensive metabolic panel   Snoring    Seeing pulmonology. Going to have a sleep study.      Myopia of both eyes    Referral to opthalmology made today.      Relevant Orders  Ambulatory referral to Ophthalmology    Other Visit Diagnoses    Welcome to Medicare preventive visit    -  Primary   Preventative care discussed today. Declines  Vaccines. See below. Conitnue diet and exercise. Call with any concerns.    Relevant Orders   UA/M w/rflx Culture, Routine   EKG 12-Lead (Completed)   Acute cystitis without hematuria       Rechecking urine to confirm resolution   Relevant Orders   UA/M w/rflx Culture, Routine   Screening for colon cancer       Cologuard ordered today. Await results.    Relevant Orders   Cologuard   Tick bite of abdomen, subsequent encounter       Years ago, never fully healed at bite site, achey since then. Would like to be screened for tick-bourne illnesses. Order put in today   Relevant Orders   Lyme Ab/Western Blot Reflex   Rocky mtn spotted fvr abs pnl(IgG+IgM)   Ehrlichia Antibody Panel   Babesia microti Antibody Panel   Screening for HIV without presence of risk factors       Labs checked today, await results.    Relevant Orders   HIV antibody   Need for hepatitis C screening test       Labs checked today, await results.    Relevant Orders   Hepatitis C Antibody   Hx of tobacco use, presenting hazards to health       Note to Burgess Estelle for low dose CT put in today      Preventative Services:  AAA screening: N/A Health Risk Assessment and Personalized Prevention Plan: done today Bone Mass Measurements: ordered today- call to get this scheduled Breast Cancer Screening: ordered today- call to get this scheduled CVD Screening: Done today Cervical Cancer Screening: Not due until next year Colon Cancer Screening: Cologuard ordered today Depression Screening: Done today Diabetes Screening: Done today Glaucoma Screening: See your eye doctor Hepatitis B vaccine: N/A Hepatitis C screening: Done today HIV Screening: Done today Flu Vaccine: Refused Lung cancer Screening: Ordered today Obesity Screening: Done today Pneumonia Vaccines (2): Refused STI Screening: N/A  Follow up plan: Return in about 6 months (around 12/14/2016) for BP/Cholesterol follow up.   LABORATORY TESTING:  -  Pap smear: up to date  IMMUNIZATIONS:   - Tdap: Tetanus vaccination status reviewed: Refused. - Influenza: Refused - Pneumovax: Refused - Prevnar: Refused - Zostavax vaccine: Refused  SCREENING: -Mammogram: Ordered today  - Colonoscopy: Cologuard Ordered today  - Bone Density: Ordered today  -Hearing Test: Ordered today  -Spirometry: Not applicable   PATIENT COUNSELING:   Advised to take 1 mg of folate supplement per day if capable of pregnancy.   Sexuality: Discussed sexually transmitted diseases, partner selection, use of condoms, avoidance of unintended pregnancy  and contraceptive alternatives.   Advised to avoid cigarette smoking.  I discussed with the patient that most people either abstain from alcohol or drink within safe limits (<=14/week and <=4 drinks/occasion for males, <=7/weeks and <= 3 drinks/occasion for females) and that the risk for alcohol disorders and other health effects rises proportionally with the number of drinks per week and how often a drinker exceeds daily limits.  Discussed cessation/primary prevention of drug use and availability of treatment for abuse.   Diet: Encouraged to adjust caloric intake to maintain  or achieve ideal body weight, to reduce intake of dietary saturated fat and total fat, to limit sodium intake by avoiding high sodium foods and not  adding table salt, and to maintain adequate dietary potassium and calcium preferably from fresh fruits, vegetables, and low-fat dairy products.    stressed the importance of regular exercise  Injury prevention: Discussed safety belts, safety helmets, smoke detector, smoking near bedding or upholstery.   Dental health: Discussed importance of regular tooth brushing, flossing, and dental visits.    NEXT PREVENTATIVE PHYSICAL DUE IN 1 YEAR. Return in about 6 months (around 12/14/2016) for BP/Cholesterol follow up.

## 2016-06-16 NOTE — Patient Instructions (Addendum)
Preventative Services:  AAA screening: N/A Health Risk Assessment and Personalized Prevention Plan: done today Bone Mass Measurements: ordered today- call to get this scheduled Breast Cancer Screening: ordered today- call to get this scheduled CVD Screening: Done today Cervical Cancer Screening: Not due until next year Colon Cancer Screening: Cologuard ordered today Depression Screening: Done today Diabetes Screening: Done today Glaucoma Screening: See your eye doctor Hepatitis B vaccine: N/A Hepatitis C screening: Done today HIV Screening: Done today Flu Vaccine: Refused Lung cancer Screening: Ordered today Obesity Screening: Done today Pneumonia Vaccines (2): Refused STI Screening: N/A   Health Maintenance, Female Adopting a healthy lifestyle and getting preventive care can go a long way to promote health and wellness. Talk with your health care provider about what schedule of regular examinations is right for you. This is a good chance for you to check in with your provider about disease prevention and staying healthy. In between checkups, there are plenty of things you can do on your own. Experts have done a lot of research about which lifestyle changes and preventive measures are most likely to keep you healthy. Ask your health care provider for more information. WEIGHT AND DIET  Eat a healthy diet  Be sure to include plenty of vegetables, fruits, low-fat dairy products, and lean protein.  Do not eat a lot of foods high in solid fats, added sugars, or salt.  Get regular exercise. This is one of the most important things you can do for your health.  Most adults should exercise for at least 150 minutes each week. The exercise should increase your heart rate and make you sweat (moderate-intensity exercise).  Most adults should also do strengthening exercises at least twice a week. This is in addition to the moderate-intensity exercise.  Maintain a healthy weight  Body mass  index (BMI) is a measurement that can be used to identify possible weight problems. It estimates body fat based on height and weight. Your health care provider can help determine your BMI and help you achieve or maintain a healthy weight.  For females 44 years of age and older:   A BMI below 18.5 is considered underweight.  A BMI of 18.5 to 24.9 is normal.  A BMI of 25 to 29.9 is considered overweight.  A BMI of 30 and above is considered obese.  Watch levels of cholesterol and blood lipids  You should start having your blood tested for lipids and cholesterol at 65 years of age, then have this test every 5 years.  You may need to have your cholesterol levels checked more often if:  Your lipid or cholesterol levels are high.  You are older than 65 years of age.  You are at high risk for heart disease.  CANCER SCREENING   Lung Cancer  Lung cancer screening is recommended for adults 52-69 years old who are at high risk for lung cancer because of a history of smoking.  A yearly low-dose CT scan of the lungs is recommended for people who:  Currently smoke.  Have quit within the past 15 years.  Have at least a 30-pack-year history of smoking. A pack year is smoking an average of one pack of cigarettes a day for 1 year.  Yearly screening should continue until it has been 15 years since you quit.  Yearly screening should stop if you develop a health problem that would prevent you from having lung cancer treatment.  Breast Cancer  Practice breast self-awareness. This means understanding how your  breasts normally appear and feel.  It also means doing regular breast self-exams. Let your health care provider know about any changes, no matter how small.  If you are in your 20s or 30s, you should have a clinical breast exam (CBE) by a health care provider every 1-3 years as part of a regular health exam.  If you are 41 or older, have a CBE every year. Also consider having a  breast X-ray (mammogram) every year.  If you have a family history of breast cancer, talk to your health care provider about genetic screening.  If you are at high risk for breast cancer, talk to your health care provider about having an MRI and a mammogram every year.  Breast cancer gene (BRCA) assessment is recommended for women who have family members with BRCA-related cancers. BRCA-related cancers include:  Breast.  Ovarian.  Tubal.  Peritoneal cancers.  Results of the assessment will determine the need for genetic counseling and BRCA1 and BRCA2 testing. Cervical Cancer Your health care provider may recommend that you be screened regularly for cancer of the pelvic organs (ovaries, uterus, and vagina). This screening involves a pelvic examination, including checking for microscopic changes to the surface of your cervix (Pap test). You may be encouraged to have this screening done every 3 years, beginning at age 41.  For women ages 84-65, health care providers may recommend pelvic exams and Pap testing every 3 years, or they may recommend the Pap and pelvic exam, combined with testing for human papilloma virus (HPV), every 5 years. Some types of HPV increase your risk of cervical cancer. Testing for HPV may also be done on women of any age with unclear Pap test results.  Other health care providers may not recommend any screening for nonpregnant women who are considered low risk for pelvic cancer and who do not have symptoms. Ask your health care provider if a screening pelvic exam is right for you.  If you have had past treatment for cervical cancer or a condition that could lead to cancer, you need Pap tests and screening for cancer for at least 20 years after your treatment. If Pap tests have been discontinued, your risk factors (such as having a new sexual partner) need to be reassessed to determine if screening should resume. Some women have medical problems that increase the chance of  getting cervical cancer. In these cases, your health care provider may recommend more frequent screening and Pap tests. Colorectal Cancer  This type of cancer can be detected and often prevented.  Routine colorectal cancer screening usually begins at 65 years of age and continues through 65 years of age.  Your health care provider may recommend screening at an earlier age if you have risk factors for colon cancer.  Your health care provider may also recommend using home test kits to check for hidden blood in the stool.  A small camera at the end of a tube can be used to examine your colon directly (sigmoidoscopy or colonoscopy). This is done to check for the earliest forms of colorectal cancer.  Routine screening usually begins at age 61.  Direct examination of the colon should be repeated every 5-10 years through 65 years of age. However, you may need to be screened more often if early forms of precancerous polyps or small growths are found. Skin Cancer  Check your skin from head to toe regularly.  Tell your health care provider about any new moles or changes in moles, especially if  there is a change in a mole's shape or color.  Also tell your health care provider if you have a mole that is larger than the size of a pencil eraser.  Always use sunscreen. Apply sunscreen liberally and repeatedly throughout the day.  Protect yourself by wearing long sleeves, pants, a wide-brimmed hat, and sunglasses whenever you are outside. HEART DISEASE, DIABETES, AND HIGH BLOOD PRESSURE   High blood pressure causes heart disease and increases the risk of stroke. High blood pressure is more likely to develop in:  People who have blood pressure in the high end of the normal range (130-139/85-89 mm Hg).  People who are overweight or obese.  People who are African American.  If you are 42-25 years of age, have your blood pressure checked every 3-5 years. If you are 54 years of age or older, have  your blood pressure checked every year. You should have your blood pressure measured twice--once when you are at a hospital or clinic, and once when you are not at a hospital or clinic. Record the average of the two measurements. To check your blood pressure when you are not at a hospital or clinic, you can use:  An automated blood pressure machine at a pharmacy.  A home blood pressure monitor.  If you are between 43 years and 21 years old, ask your health care provider if you should take aspirin to prevent strokes.  Have regular diabetes screenings. This involves taking a blood sample to check your fasting blood sugar level.  If you are at a normal weight and have a low risk for diabetes, have this test once every three years after 65 years of age.  If you are overweight and have a high risk for diabetes, consider being tested at a younger age or more often. PREVENTING INFECTION  Hepatitis B  If you have a higher risk for hepatitis B, you should be screened for this virus. You are considered at high risk for hepatitis B if:  You were born in a country where hepatitis B is common. Ask your health care provider which countries are considered high risk.  Your parents were born in a high-risk country, and you have not been immunized against hepatitis B (hepatitis B vaccine).  You have HIV or AIDS.  You use needles to inject street drugs.  You live with someone who has hepatitis B.  You have had sex with someone who has hepatitis B.  You get hemodialysis treatment.  You take certain medicines for conditions, including cancer, organ transplantation, and autoimmune conditions. Hepatitis C  Blood testing is recommended for:  Everyone born from [birth year] through 1965.  Anyone with known risk factors for hepatitis C. Sexually transmitted infections (STIs)  You should be screened for sexually transmitted infections (STIs) including gonorrhea and chlamydia if:  You are sexually active and  are younger than 65 years of age.  You are older than 65 years of age and your health care provider tells you that you are at risk for this type of infection.  Your sexual activity has changed since you were last screened and you are at an increased risk for chlamydia or gonorrhea. Ask your health care provider if you are at risk.  If you do not have HIV, but are at risk, it may be recommended that you take a prescription medicine daily to prevent HIV infection. This is called pre-exposure prophylaxis (PrEP). You are considered at risk if:  You are sexually active and do  not regularly use condoms or know the HIV status of your partner(s).  You take drugs by injection.  You are sexually active with a partner who has HIV. Talk with your health care provider about whether you are at high risk of being infected with HIV. If you choose to begin PrEP, you should first be tested for HIV. You should then be tested every 3 months for as long as you are taking PrEP.  PREGNANCY   If you are premenopausal and you may become pregnant, ask your health care provider about preconception counseling.  If you may become pregnant, take 400 to 800 micrograms (mcg) of folic acid every day.  If you want to prevent pregnancy, talk to your health care provider about birth control (contraception). OSTEOPOROSIS AND MENOPAUSE   Osteoporosis is a disease in which the bones lose minerals and strength with aging. This can result in serious bone fractures. Your risk for osteoporosis can be identified using a bone density scan.  If you are 98 years of age or older, or if you are at risk for osteoporosis and fractures, ask your health care provider if you should be screened.  Ask your health care provider whether you should take a calcium or vitamin D supplement to lower your risk for osteoporosis.  Menopause may have certain physical symptoms and risks.  Hormone replacement therapy may reduce some of these symptoms  and risks. Talk to your health care provider about whether hormone replacement therapy is right for you.  HOME CARE INSTRUCTIONS   Schedule regular health, dental, and eye exams.  Stay current with your immunizations.   Do not use any tobacco products including cigarettes, chewing tobacco, or electronic cigarettes.  If you are pregnant, do not drink alcohol.  If you are breastfeeding, limit how much and how often you drink alcohol.  Limit alcohol intake to no more than 1 drink per day for nonpregnant women. One drink equals 12 ounces of beer, 5 ounces of wine, or 1 ounces of hard liquor.  Do not use street drugs.  Do not share needles.  Ask your health care provider for help if you need support or information about quitting drugs.  Tell your health care provider if you often feel depressed.  Tell your health care provider if you have ever been abused or do not feel safe at home.   This information is not intended to replace advice given to you by your health care provider. Make sure you discuss any questions you have with your health care provider.   Document Released: 03/30/2011 Document Revised: 10/05/2014 Document Reviewed: 08/16/2013 Elsevier Interactive Patient Education [employer]. Menopause is a normal process in which your reproductive ability comes to an end. This process happens gradually over a span of months to years, usually between the ages of 73 and 46. Menopause is complete when you have missed 12 consecutive menstrual periods. It is important to talk with your health care provider about some of the most common conditions that affect postmenopausal women, such as heart disease, cancer, and bone loss (osteoporosis). Adopting a healthy lifestyle and getting preventive care can help to promote your health and wellness. Those actions can also lower your chances of developing some of these common conditions. WHAT SHOULD I KNOW ABOUT MENOPAUSE? During menopause,  you may experience a number of symptoms, such as:  Moderate-to-severe hot flashes.  Night sweats.  Decrease in sex drive.  Mood swings.  Headaches.  Tiredness.  Irritability.  Memory problems.  Insomnia. Choosing to treat or not to treat menopausal changes is an individual decision that you make with your health care provider. WHAT SHOULD I KNOW ABOUT HORMONE REPLACEMENT THERAPY AND SUPPLEMENTS? Hormone therapy products are effective for treating symptoms that are associated with menopause, such as hot flashes and night sweats. Hormone replacement carries certain risks, especially as you become older. If you are thinking about using estrogen or estrogen with progestin treatments, discuss the benefits and risks with your health care provider. WHAT SHOULD I KNOW ABOUT HEART DISEASE AND STROKE? Heart disease, heart attack, and stroke become more likely as you age. This may be due, in part, to the hormonal changes that your body experiences during menopause. These can affect how your body processes dietary fats, triglycerides, and cholesterol. Heart attack and stroke are both medical emergencies. There are many things that you can do to help prevent heart disease and stroke:  Have your blood pressure checked at least every 1-2 years. High blood pressure causes heart disease and increases the risk of stroke.  If you are 45-64 years old, ask your health care provider if you should take aspirin to prevent a heart attack or a stroke.  Do not use any tobacco products, including cigarettes, chewing tobacco, or electronic cigarettes. If you need help quitting, ask your health care provider.  It is important to eat a healthy diet and maintain a healthy weight.  Be sure to include plenty of vegetables, fruits, low-fat dairy products, and lean protein.  Avoid eating foods that are high in solid fats, added sugars, or salt (sodium).  Get regular exercise. This is one of the most important  things that you can do for your health.  Try to exercise for at least 150 minutes each week. The type of exercise that you do should increase your heart rate and make you sweat. This is known as moderate-intensity exercise.  Try to do strengthening exercises at least twice each week. Do these in addition to the moderate-intensity exercise.  Know your numbers.Ask your health care provider to check your cholesterol and your blood glucose. Continue to have your blood tested as directed by your health care provider. WHAT SHOULD I KNOW ABOUT CANCER SCREENING? There are several types of cancer. Take the following steps to reduce your risk and to catch any cancer development as early as possible. Breast Cancer  Practice breast self-awareness.  This means understanding how your breasts normally appear and feel.  It also means doing regular breast self-exams. Let your health care provider know about any changes, no matter how small.  If you are 47 or older, have a clinician do a breast exam (clinical breast exam or CBE) every year. Depending on your age, family history, and medical history, it may be recommended that you also have a yearly breast X-ray (mammogram).  If you have a family history of breast cancer, talk with your health care provider about genetic screening.  If you are at high risk for breast cancer, talk with your health care provider about having an MRI and a mammogram every year.  Breast cancer (BRCA) gene test is recommended for women who have family members with BRCA-related cancers. Results of the assessment will determine the need for genetic counseling and BRCA1 and for BRCA2 testing. BRCA-related cancers include these types:  Breast. This occurs in males or females.  Ovarian.  Tubal. This may also be called fallopian tube cancer.  Cancer of the abdominal or pelvic lining (peritoneal cancer).  Prostate.  Pancreatic. Cervical, Uterine, and Ovarian Cancer Your health  care provider may recommend that you be screened regularly for cancer of the pelvic organs. These include your ovaries, uterus, and vagina. This screening involves a pelvic exam, which includes checking for microscopic changes to the surface of your cervix (Pap test).  For women ages 21-65, health care providers may recommend a pelvic exam and a Pap test every three years. For women ages 55-65, they may recommend the Pap test and pelvic exam, combined with testing for human papilloma virus (HPV), every five years. Some types of HPV increase your risk of cervical cancer. Testing for HPV may also be done on women of any age who have unclear Pap test results.  Other health care providers may not recommend any screening for nonpregnant women who are considered low risk for pelvic cancer and have no symptoms. Ask your health care provider if a screening pelvic exam is right for you.  If you have had past treatment for cervical cancer or a condition that could lead to cancer, you need Pap tests and screening for cancer for at least 20 years after your treatment. If Pap tests have been discontinued for you, your risk factors (such as having a new sexual partner) need to be reassessed to determine if you should start having screenings again. Some women have medical problems that increase the chance of getting cervical cancer. In these cases, your health care provider may recommend that you have screening and Pap tests more often.  If you have a family history of uterine cancer or ovarian cancer, talk with your health care provider about genetic screening.  If you have vaginal bleeding after reaching menopause, tell your health care provider.  There are currently no reliable tests available to screen for ovarian cancer. Lung Cancer Lung cancer screening is recommended for adults 84-50 years old who are at high risk for lung cancer because of a history of smoking. A yearly low-dose CT scan of the lungs is  recommended if you:  Currently smoke.  Have a history of at least 30 pack-years of smoking and you currently smoke or have quit within the past 15 years. A pack-year is smoking an average of one pack of cigarettes per day for one year. Yearly screening should:  Continue until it has been 15 years since you quit.  Stop if you develop a health problem that would prevent you from having lung cancer treatment. Colorectal Cancer  This type of cancer can be detected and can often be prevented.  Routine colorectal cancer screening usually begins at age 56 and continues through age 41.  If you have risk factors for colon cancer, your health care provider may recommend that you be screened at an earlier age.  If you have a family history of colorectal cancer, talk with your health care provider about genetic screening.  Your health care provider may also recommend using home test kits to check for hidden blood in your stool.  A small camera at the end of a tube can be used to examine your colon directly (sigmoidoscopy or colonoscopy). This is done to check for the earliest forms of colorectal cancer.  Direct examination of the colon should be repeated every 5-10 years until age 41. However, if early forms of precancerous polyps or small growths are found or if you have a family history or genetic risk for colorectal cancer, you may need to be screened more often. Skin Cancer  Check your skin from head  to toe regularly.  Monitor any moles. Be sure to tell your health care provider:  About any new moles or changes in moles, especially if there is a change in a mole's shape or color.  If you have a mole that is larger than the size of a pencil eraser.  If any of your family members has a history of skin cancer, especially at a young age, talk with your health care provider about genetic screening.  Always use sunscreen. Apply sunscreen liberally and repeatedly throughout the day.  Whenever  you are outside, protect yourself by wearing long sleeves, pants, a wide-brimmed hat, and sunglasses. WHAT SHOULD I KNOW ABOUT OSTEOPOROSIS? Osteoporosis is a condition in which bone destruction happens more quickly than new bone creation. After menopause, you may be at an increased risk for osteoporosis. To help prevent osteoporosis or the bone fractures that can happen because of osteoporosis, the following is recommended:  If you are 7-46 years old, get at least 1,000 mg of calcium and at least 600 mg of vitamin D per day.  If you are older than age 25 but younger than age 65, get at least 1,200 mg of calcium and at least 600 mg of vitamin D per day.  If you are older than age 52, get at least 1,200 mg of calcium and at least 800 mg of vitamin D per day. Smoking and excessive alcohol intake increase the risk of osteoporosis. Eat foods that are rich in calcium and vitamin D, and do weight-bearing exercises several times each week as directed by your health care provider. WHAT SHOULD I KNOW ABOUT HOW MENOPAUSE AFFECTS [place]? Depression may occur at any age, but it is more common as you become older. Common symptoms of depression include:  Low or sad mood.  Changes in sleep patterns.  Changes in appetite or eating patterns.  Feeling an overall lack of motivation or enjoyment of activities that you previously enjoyed.  Frequent crying spells. Talk with your health care provider if you think that you are experiencing depression. WHAT SHOULD I KNOW ABOUT IMMUNIZATIONS? It is important that you get and maintain your immunizations. These include:  Tetanus, diphtheria, and pertussis (Tdap) booster vaccine.  Influenza every year before the flu season begins.  Pneumonia vaccine.  Shingles vaccine. Your health care provider may also recommend other immunizations.   This information is not intended to replace advice given to you by your health care provider. Make sure you discuss  any questions you have with your health care provider.   Document Released: 11/06/2005 Document Revised: 10/05/2014 Document Reviewed: 05/17/2014 Elsevier Interactive Patient Education 2016 [place] Directive Advance directives are the legal documents that allow you to make choices about your health care and medical treatment if you cannot speak for yourself. Advance directives are a way for you to communicate your wishes to family, friends, and health care providers. The specified people can then convey your decisions about end-of-life care to avoid confusion if you should become unable to communicate. Ideally, the process of discussing and writing advance directives should happen over time rather than making decisions all at once. Advance directives can be modified as your situation changes, and you can change your mind at any time, even after you have signed the advance directives. Each state has its own laws regarding advance directives. You may want to check with your health care provider, attorney, or state representative about the law in your state. Below are some examples of  advance directives. LIVING WILL A living will is a set of instructions documenting your wishes about medical care when you cannot care for yourself. It is used if you become:  Terminally ill.  Incapacitated.  Unable to communicate.  Unable to make decisions. Items to consider in your living will include:  The use or non-use of life-sustaining equipment, such as dialysis machines and breathing machines (ventilators).  A do not resuscitate (DNR) order, which is the instruction not to use cardiopulmonary resuscitation (CPR) if breathing or heartbeat stops.  Tube feeding.  Withholding of food and fluids.  Comfort (palliative) care when the goal becomes comfort rather than a cure.  Organ and tissue donation. A living will does not give instructions about distribution of your money and property if  you should pass away. It is advisable to seek the expert advice of a lawyer in drawing up a will regarding your possessions. Decisions about taxes, beneficiaries, and asset distribution will be legally binding. This process can relieve your family and friends of any burdens surrounding disputes or questions that may come up about the allocation of your assets. DO NOT RESUSCITATE (DNR) A do not resuscitate (DNR) order is a request to not have CPR in the event that your heart stops beating or you stop breathing. Unless given other instructions, a health care provider will try to help any patient whose heart has stopped or who has stopped breathing.  HEALTH CARE PROXY AND DURABLE POWER OF ATTORNEY FOR HEALTH CARE A health care proxy is a person (agent) appointed to make medical decisions for you if you cannot. Generally, people choose someone they know well and trust to represent their preferences when they can no longer do so. You should be sure to ask this person for agreement to act as your agent. An agent may have to exercise judgment in the event of a medical decision for which your wishes are not known. The durable power of attorney for health care is the legal document that names your health care proxy. Once written, it should be:  Signed.  Notarized.  Dated.  Copied.  Witnessed.  Incorporated into your medical record. You may also want to appoint someone to manage your financial affairs if you cannot. This is called a durable power of attorney for finances. It is a separate legal document from the durable power of attorney for health care. You may choose the same person or someone different from your health care proxy to act as your agent in financial matters.   This information is not intended to replace advice given to you by your health care provider. Make sure you discuss any questions you have with your health care provider.   Document Released: 12/22/2007 Document Revised: 09/19/2013  Document Reviewed: 02/01/2013 Elsevier Interactive Patient Education [employer].

## 2016-06-19 ENCOUNTER — Encounter: Payer: Self-pay | Admitting: Family Medicine

## 2016-06-19 LAB — COMPREHENSIVE METABOLIC PANEL
A/G RATIO: 1.8 (ref 1.2–2.2)
ALBUMIN: 4.3 g/dL (ref 3.6–4.8)
ALT: 20 IU/L (ref 0–32)
AST: 15 IU/L (ref 0–40)
Alkaline Phosphatase: 118 IU/L — ABNORMAL HIGH (ref 39–117)
BILIRUBIN TOTAL: 1.4 mg/dL — AB (ref 0.0–1.2)
BUN / CREAT RATIO: 18 (ref 12–28)
BUN: 16 mg/dL (ref 8–27)
CALCIUM: 9.4 mg/dL (ref 8.7–10.3)
CHLORIDE: 107 mmol/L — AB (ref 96–106)
CO2: 25 mmol/L (ref 18–29)
Creatinine, Ser: 0.89 mg/dL (ref 0.57–1.00)
GFR, EST AFRICAN AMERICAN: 79 mL/min/{1.73_m2} (ref >59)
GFR, EST NON AFRICAN AMERICAN: 68 mL/min/{1.73_m2} (ref >59)
GLUCOSE: 96 mg/dL (ref 65–99)
Globulin, Total: 2.4 g/dL (ref 1.5–4.5)
Potassium: 4.9 mmol/L (ref 3.5–5.2)
Sodium: 145 mmol/L — ABNORMAL HIGH (ref 134–144)
TOTAL PROTEIN: 6.7 g/dL (ref 6.0–8.5)

## 2016-06-19 LAB — EHRLICHIA ANTIBODY PANEL
E. CHAFFEENSIS (HME) IGM TITER: NEGATIVE
E.Chaffeensis (HME) IgG: 1:128 {titer} — ABNORMAL HIGH
HGE IGG TITER: NEGATIVE
HGE IgM Titer: NEGATIVE

## 2016-06-19 LAB — LIPID PANEL W/O CHOL/HDL RATIO
CHOLESTEROL TOTAL: 157 mg/dL (ref 100–199)
HDL: 48 mg/dL (ref >39)
LDL Calculated: 70 mg/dL (ref 0–99)
TRIGLYCERIDES: 197 mg/dL — AB (ref 0–149)
VLDL Cholesterol Cal: 39 mg/dL (ref 5–40)

## 2016-06-19 LAB — BABESIA MICROTI ANTIBODY PANEL
Babesia microti IgG: 1:80 {titer} — ABNORMAL HIGH
Babesia microti IgM: <1:10 {titer}

## 2016-06-19 LAB — HEPATITIS C ANTIBODY: Hep C Virus Ab: <0.1 s/co ratio (ref 0.0–0.9)

## 2016-06-19 LAB — ROCKY MTN SPOTTED FVR ABS PNL(IGG+IGM)
RMSF IgG: NEGATIVE
RMSF IgM: 0.56 index (ref 0.00–0.89)

## 2016-06-19 LAB — HIV ANTIBODY (ROUTINE TESTING W REFLEX): HIV Screen 4th Generation wRfx: NONREACTIVE

## 2016-06-19 LAB — LYME AB/WESTERN BLOT REFLEX: LYME DISEASE AB, QUANT, IGM: <0.8 index (ref 0.00–0.79)

## 2016-06-23 ENCOUNTER — Encounter: Payer: Self-pay | Admitting: *Deleted

## 2016-07-01 ENCOUNTER — Telehealth: Payer: Self-pay | Admitting: *Deleted

## 2016-07-01 ENCOUNTER — Encounter: Payer: Self-pay | Admitting: *Deleted

## 2016-07-01 LAB — COLOGUARD: COLOGUARD: NEGATIVE

## 2016-07-01 NOTE — Telephone Encounter (Signed)
Received referral for initial lung cancer screening scan. Contacted patient and obtained smoking history,(former, quit 2015, 33 pack year) as well as answering questions related to screening process. Patient denies signs of lung cancer such as weight loss or hemoptysis. Patient denies comorbidity that would prevent curative treatment if lung cancer were found. Patient is tentatively scheduled for shared decision making visit and CT scan on 07/07/16, pending insurance approval from business office.

## 2016-07-06 ENCOUNTER — Other Ambulatory Visit: Payer: Self-pay | Admitting: *Deleted

## 2016-07-06 DIAGNOSIS — Z87891 Personal history of nicotine dependence: Secondary | ICD-10-CM

## 2016-07-07 ENCOUNTER — Inpatient Hospital Stay: Payer: Medicare Other | Attending: Oncology | Admitting: Oncology

## 2016-07-07 ENCOUNTER — Ambulatory Visit
Admission: RE | Admit: 2016-07-07 | Discharge: 2016-07-07 | Disposition: A | Discharge status: Home / Self Care | Payer: Medicare Other | Source: Ambulatory Visit | Attending: Family Medicine | Admitting: Family Medicine

## 2016-07-07 ENCOUNTER — Ambulatory Visit
Admission: RE | Admit: 2016-07-07 | Discharge: 2016-07-07 | Disposition: A | Discharge status: Home / Self Care | Payer: Medicare Other | Source: Ambulatory Visit | Attending: Oncology | Admitting: Oncology

## 2016-07-07 ENCOUNTER — Telehealth: Payer: Self-pay | Admitting: Family Medicine

## 2016-07-07 ENCOUNTER — Other Ambulatory Visit: Payer: Self-pay | Admitting: Family Medicine

## 2016-07-07 DIAGNOSIS — Z1231 Encounter for screening mammogram for malignant neoplasm of breast: Secondary | ICD-10-CM

## 2016-07-07 DIAGNOSIS — M810 Age-related osteoporosis without current pathological fracture: Secondary | ICD-10-CM | POA: Diagnosis not present

## 2016-07-07 DIAGNOSIS — Z87891 Personal history of nicotine dependence: Secondary | ICD-10-CM

## 2016-07-07 DIAGNOSIS — Z122 Encounter for screening for malignant neoplasm of respiratory organs: Secondary | ICD-10-CM | POA: Insufficient documentation

## 2016-07-07 DIAGNOSIS — Z13820 Encounter for screening for osteoporosis: Secondary | ICD-10-CM | POA: Insufficient documentation

## 2016-07-07 DIAGNOSIS — I2510 Atherosclerotic heart disease of native coronary artery without angina pectoris: Secondary | ICD-10-CM | POA: Diagnosis not present

## 2016-07-07 DIAGNOSIS — I700 Atherosclerosis of aorta: Secondary | ICD-10-CM | POA: Diagnosis not present

## 2016-07-07 DIAGNOSIS — M818 Other osteoporosis without current pathological fracture: Secondary | ICD-10-CM | POA: Diagnosis not present

## 2016-07-07 NOTE — Telephone Encounter (Signed)
Called patient to give her her results. LMOM for her to call back for results. Mammogram was good, nice and normal. Bone density shows osteoporosis. If she has never been on medicine before, we do recommend fosamax to strengthen her bones for 5 years. Will discuss this with her when she calls back.

## 2016-07-08 MED ORDER — ALENDRONATE SODIUM 70 MG PO TABS: 70.0000 mg | ORAL_TABLET | ORAL | 11 refills | Status: DC

## 2016-07-08 NOTE — Telephone Encounter (Signed)
Called and discussed results. Will start fosamax. Will call with any GI upset

## 2016-07-09 ENCOUNTER — Encounter: Payer: Self-pay | Admitting: Family Medicine

## 2016-07-09 ENCOUNTER — Telehealth: Payer: Self-pay | Admitting: *Deleted

## 2016-07-09 DIAGNOSIS — I10 Essential (primary) hypertension: Secondary | ICD-10-CM

## 2016-07-09 DIAGNOSIS — I25119 Atherosclerotic heart disease of native coronary artery with unspecified angina pectoris: Secondary | ICD-10-CM

## 2016-07-09 DIAGNOSIS — I700 Atherosclerosis of aorta: Secondary | ICD-10-CM | POA: Insufficient documentation

## 2016-07-09 DIAGNOSIS — E782 Mixed hyperlipidemia: Secondary | ICD-10-CM

## 2016-07-09 NOTE — Telephone Encounter (Signed)
Tiff- can you please check with her to see if she has seen a cardiologist recently? I'd like her to see one because of her heart attack and because of what they saw on her CT scan. If she does, great, if not, let me know and I'll get her a referral. Thanks!

## 2016-07-09 NOTE — Addendum Note (Signed)
Addended by: Valerie Roys on: 07/09/2016 02:36 PM   Modules accepted: Orders

## 2016-07-09 NOTE — Telephone Encounter (Signed)
Please place referral, patient states that she has not seen a cardiologist.

## 2016-07-09 NOTE — Telephone Encounter (Signed)
Notified patient of LDCT lung cancer screening results with recommendation for 12 month follow up imaging. Also notified of incidental finding noted below. Patient verbalizes understanding. A copy of this note will be sent to PCP via Epic.  IMPRESSION: 1. Lung-RADS Category 2S, benign appearance or behavior. Continue annual screening with low-dose chest CT without contrast in 12 months. 2. The "S" modifier above refers to potentially clinically significant non lung cancer related findings. Specifically, there is aortic atherosclerosis, in addition to left main and 3 vessel coronary artery disease. Please note that although the presence of coronary artery calcium documents the presence of coronary artery disease, the severity of this disease and any potential stenosis cannot be assessed on this non-gated CT examination. Assessment for potential risk factor modification, dietary therapy or pharmacologic therapy may be warranted, if clinically indicated. 3. Additional incidental findings, as above.

## 2016-07-11 DIAGNOSIS — Z87891 Personal history of nicotine dependence: Secondary | ICD-10-CM | POA: Insufficient documentation

## 2016-07-11 NOTE — Progress Notes (Signed)
In accordance with CMS guidelines, patient has met eligibility criteria including age, absence of signs or symptoms of lung cancer.  Social History  Substance Use Topics  . Smoking status: Former Smoker    Packs/day: 1.50    Years: 22.00    Quit date: 05/08/2014  . Smokeless tobacco: Never Used     Comment: Smoked over 25 years, quit after Heart Attack in 2015.    Marland Kitchen Alcohol use 0.0 oz/week     A shared decision-making session was conducted prior to the performance of CT scan. This includes one or more decision aids, includes benefits and harms of screening, follow-up diagnostic testing, over-diagnosis, false positive rate, and total radiation exposure.  Counseling on the importance of adherence to annual lung cancer LDCT screening, impact of co-morbidities, and ability or willingness to undergo diagnosis and treatment is imperative for compliance of the program.  Counseling on the importance of continued smoking cessation for former smokers; the importance of smoking cessation for current smokers, and information about tobacco cessation interventions have been given to patient including [place] and 1800 quit [place] programs.  Written order for lung cancer screening with LDCT has been given to the patient and any and all questions have been answered to the best of my abilities.   Yearly follow up will be coordinated by Burgess Estelle, Thoracic Navigator.

## 2016-07-14 ENCOUNTER — Telehealth: Payer: Self-pay

## 2016-07-14 NOTE — Telephone Encounter (Signed)
Called to let patient know that her cologuard came back negative.

## 2016-07-21 DIAGNOSIS — H2513 Age-related nuclear cataract, bilateral: Secondary | ICD-10-CM | POA: Diagnosis not present

## 2016-08-06 ENCOUNTER — Other Ambulatory Visit: Payer: Self-pay

## 2016-08-13 ENCOUNTER — Ambulatory Visit: Payer: Self-pay

## 2016-09-14 NOTE — Progress Notes (Signed)
[facility] Consultation      Assessment and Plan:  Excessive daytime sleepiness.  --Symptoms and sign of OSA, will send for sleep study.   Insomnia.  --Sleep initiation and sleep maintenance type, continue ambien 5 mg qhs.   Anxiety. --Recommend referral to behavioral health for management of anxiety.   Essential hypertension.  --Sleep apnea can contribute to hypertension, will continue to monitor.    Date: 09/14/2016  MRN# [id number] Tonya Cabrera 1951/08/30  Referring Physician:   ALIJAH HUSO is a 65 y.o. old female seen in consultation for chief complaint of:    Chief Complaint  Patient presents with  . Follow-up    38mo rov. pt states sleeping has improved with ambien. pt c/o cont. daytime sleepiness & restless sleep.     HPI:   The patient is a 65 yo female with a history of CAD, HTN. At last visit she was noted to have symptoms of excessive daytime sleepiness, she was referred for a sleep study. She was also noted to have sleep maintenance insomnia, and was started on ambien 5 mg qhs.  She is taking that every night and that helps her get about 3 to 4 hours per night and feels that it is helping her feel less sleepy during the day, however she continues to have daytime sleepiness.  She feels that her anxiety is really bad, her daughter has recently moved in with her alcoholic husband, and this is make her "anxiety go through the roof". She is tearful about her anxiety and her life stressors.   She smoked about 1 ppd until her heart attack in 2015. She is undergoing CT lung cancer screening.    PMHX:   Past Medical History:  Diagnosis Date  . Hypertension   . Myocardial infarction    2015  . Stroke [facility])    10-12 years ago   Surgical Hx:  No past surgical history on file. Family Hx:  Family History  Problem Relation Age of Onset  . Liver cancer Brother   . Heart Problems Mother   . Breast cancer Neg Hx    Social Hx:   Social  History  Substance Use Topics  . Smoking status: Former Smoker    Packs/day: 1.50    Years: 22.00    Quit date: 05/08/2014  . Smokeless tobacco: Never Used     Comment: Smoked over 25 years, quit after Heart Attack in 2015.    Marland Kitchen Alcohol use 0.0 oz/week   Medication:   Reviewed.     Allergies:  Patient has no known allergies.  Review of Systems: Gen:  Denies  fever, sweats, chills HEENT: Denies blurred vision, double vision. bleeds, sore throat Cvc:  No dizziness, chest pain. Resp:   Denies cough or sputum production, shortness of breath Gi: Denies swallowing difficulty, stomach pain. Gu:  Denies bladder incontinence, burning urine Ext:   No Joint pain, stiffness. Skin: No skin rash,  hives  Endoc:  No polyuria, polydipsia. Psych: No depression, insomnia. Other:  All other systems were reviewed with the patient and were negative other that what is mentioned in the HPI.   Physical Examination:   VS: There were no vitals taken for this visit.  General Appearance: No distress  Neuro:without focal findings,  speech normal,  HEENT: PERRLA, EOM intact.   Pulmonary: normal breath sounds, No wheezing.  CardiovascularNormal S1,S2.  No m/r/g.   Abdomen: Benign, Soft, non-tender. Renal:  No costovertebral tenderness  GU:  No performed at this time. Endoc: No evident thyromegaly, no signs of acromegaly. Skin:   warm, no rashes, no ecchymosis  Extremities: normal, no cyanosis, clubbing.  Other findings:    LABORATORY PANEL:   CBC No results for input(s): WBC, HGB, HCT, PLT in the last 168 hours. ------------------------------------------------------------------------------------------------------------------  Chemistries  No results for input(s): NA, K, CL, CO2, GLUCOSE, BUN, CREATININE, CALCIUM, MG, AST, ALT, ALKPHOS, BILITOT in the last 168 hours.  Invalid input(s):  GFRCGP ------------------------------------------------------------------------------------------------------------------  Cardiac Enzymes No results for input(s): TROPONINI in the last 168 hours. ------------------------------------------------------------  RADIOLOGY:  No results found.     Thank  you for the consultation and for allowing [facility] to assist in the care of your patient. Our recommendations are noted above.  Please contact us if we can be of further service.   [name], MD.  Board Certified in Internal Medicine, Pulmonary Medicine, [place], and Sleep Medicine.  [facility] Office Number: [phone]  [name], M.D.  [name], M.D.  [name], M.D  09/14/2016

## 2016-09-15 ENCOUNTER — Ambulatory Visit (INDEPENDENT_AMBULATORY_CARE_PROVIDER_SITE_OTHER): Payer: Medicare Other | Admitting: Internal Medicine

## 2016-09-15 ENCOUNTER — Encounter: Payer: Self-pay | Admitting: Internal Medicine

## 2016-09-15 VITALS — BP 124/68 | HR 67 | Ht 63.0 in | Wt 136.0 lb

## 2016-09-15 DIAGNOSIS — F411 Generalized anxiety disorder: Secondary | ICD-10-CM | POA: Diagnosis not present

## 2016-09-15 DIAGNOSIS — F99 Mental disorder, not otherwise specified: Secondary | ICD-10-CM

## 2016-09-15 DIAGNOSIS — G4719 Other hypersomnia: Secondary | ICD-10-CM

## 2016-09-15 DIAGNOSIS — F5105 Insomnia due to other mental disorder: Secondary | ICD-10-CM | POA: Diagnosis not present

## 2016-09-15 NOTE — Addendum Note (Signed)
Addended by: Clayborne Dana C on: 09/15/2016 11:51 AM   Modules accepted: Orders

## 2016-09-15 NOTE — Patient Instructions (Signed)
--  Refer to behavioral health for anxiety.   --Will send for a sleep study.

## 2016-10-06 ENCOUNTER — Telehealth: Payer: Self-pay | Admitting: Family Medicine

## 2016-10-06 NOTE — Telephone Encounter (Signed)
Called and left a message for patient to return my call to see if she knows what this is about or if it is ok for me to speak with him. Will wait for return call.

## 2016-10-06 NOTE — Telephone Encounter (Signed)
John with [facility] would like to speak with someone regarding this patient.  He can be reached at [place]

## 2016-10-08 NOTE — Telephone Encounter (Signed)
We got a fax from [facility] asking for Diclofenac gel, paper placed on Tiffany's desk.

## 2016-10-09 NOTE — Telephone Encounter (Signed)
Patient is unsure if she requested the prescription or not. She states that she does not need it.

## 2016-10-22 ENCOUNTER — Ambulatory Visit: Payer: Medicare Other | Attending: Internal Medicine

## 2016-10-22 ENCOUNTER — Encounter: Payer: Self-pay | Admitting: Internal Medicine

## 2016-10-22 DIAGNOSIS — I700 Atherosclerosis of aorta: Secondary | ICD-10-CM | POA: Insufficient documentation

## 2016-10-22 DIAGNOSIS — F419 Anxiety disorder, unspecified: Secondary | ICD-10-CM | POA: Diagnosis not present

## 2016-10-22 DIAGNOSIS — I10 Essential (primary) hypertension: Secondary | ICD-10-CM | POA: Diagnosis not present

## 2016-10-22 DIAGNOSIS — G4710 Hypersomnia, unspecified: Secondary | ICD-10-CM | POA: Insufficient documentation

## 2016-10-22 DIAGNOSIS — R0683 Snoring: Secondary | ICD-10-CM | POA: Insufficient documentation

## 2016-10-22 DIAGNOSIS — F5101 Primary insomnia: Secondary | ICD-10-CM | POA: Diagnosis not present

## 2016-10-22 DIAGNOSIS — G4719 Other hypersomnia: Secondary | ICD-10-CM

## 2016-10-22 DIAGNOSIS — I2510 Atherosclerotic heart disease of native coronary artery without angina pectoris: Secondary | ICD-10-CM | POA: Insufficient documentation

## 2016-11-09 ENCOUNTER — Emergency Department: Payer: Medicare Other

## 2016-11-09 ENCOUNTER — Emergency Department
Admission: EM | Admit: 2016-11-09 | Discharge: 2016-11-10 | Disposition: A | Discharge status: Home / Self Care | Payer: Medicare Other | Attending: Emergency Medicine | Admitting: Emergency Medicine

## 2016-11-09 ENCOUNTER — Telehealth: Payer: Self-pay | Admitting: *Deleted

## 2016-11-09 DIAGNOSIS — I10 Essential (primary) hypertension: Secondary | ICD-10-CM | POA: Diagnosis not present

## 2016-11-09 DIAGNOSIS — M25522 Pain in left elbow: Secondary | ICD-10-CM | POA: Diagnosis present

## 2016-11-09 DIAGNOSIS — Z79899 Other long term (current) drug therapy: Secondary | ICD-10-CM | POA: Diagnosis not present

## 2016-11-09 DIAGNOSIS — Z87891 Personal history of nicotine dependence: Secondary | ICD-10-CM | POA: Diagnosis not present

## 2016-11-09 DIAGNOSIS — M25561 Pain in right knee: Secondary | ICD-10-CM | POA: Diagnosis not present

## 2016-11-09 DIAGNOSIS — M722 Plantar fascial fibromatosis: Secondary | ICD-10-CM | POA: Insufficient documentation

## 2016-11-09 DIAGNOSIS — Z7982 Long term (current) use of aspirin: Secondary | ICD-10-CM | POA: Insufficient documentation

## 2016-11-09 DIAGNOSIS — M7712 Lateral epicondylitis, left elbow: Secondary | ICD-10-CM | POA: Insufficient documentation

## 2016-11-09 DIAGNOSIS — I2510 Atherosclerotic heart disease of native coronary artery without angina pectoris: Secondary | ICD-10-CM | POA: Insufficient documentation

## 2016-11-09 MED ORDER — NAPROXEN 500 MG PO TABS
500.0000 mg | ORAL_TABLET | Freq: Once | ORAL | Status: AC
  Administered 2016-11-10: 500 mg via ORAL
  Filled 2016-11-09: qty 1

## 2016-11-09 NOTE — ED Notes (Addendum)
Pt stating that she has 3 issues. Pt stating pain in bilateral feet, right knee, and left forearm. Pt stating that all her pain has been going on for months. Pt bilateral feet have been tingling, numbness, and discoloration to her feet. Pt stating the pain is worse when she has been standing for long periods."It feels like pins or something." Pt does not have a hx of DM . Pt stating that her right knee is hurting worse with movement. Pt denying any injuries that she remembers. Pt stating that her left forearm began hurting after she pushed extremely hard on a door that "wouldnt' give." Pt stating that she has been dx with osteoporosis. Pt's bilateral toes are cool to touch and her 4th and 5th on both feet are dusky in appearance, but have <3 sec cap refill.Pt has increased pain in these two versus the other toes. Pt's bilateral 1st, 2nd, and 3rd toes have >3 sec cap refill. Nurse is able to palpate pedal pulse in bother feet. Left is weaker than right.

## 2016-11-09 NOTE — Telephone Encounter (Signed)
Pt informed her sleep study was negative. Nothing further needed.

## 2016-11-09 NOTE — ED Triage Notes (Addendum)
Pt arrives to ER via POV c/o bilateral foot pain, tightness and redness to bottom of feet after standing up and using them all day, better with rest. No pedal edema noted. Pt also c/o left elbow pain and right knee pain. No known injuries. Pt hx of osteoporosis.   No SOB. No recent changes in medications. Pt alert and oriented X4, active, cooperative, pt in NAD. RR even and unlabored, color WNL.

## 2016-11-10 DIAGNOSIS — M7712 Lateral epicondylitis, left elbow: Secondary | ICD-10-CM | POA: Diagnosis not present

## 2016-11-10 MED ORDER — MELOXICAM 7.5 MG PO TABS: 7.5000 mg | ORAL_TABLET | Freq: Every day | ORAL | 2 refills | Status: DC

## 2016-11-10 NOTE — ED Notes (Signed)
Pt in NAD at this time and is resting comfortably with family at bedside.

## 2016-11-10 NOTE — ED Provider Notes (Signed)
[facility] Emergency Department Provider Note  ____________________________________________  Time seen: Approximately 12:04 AM  I have reviewed the triage vital signs and the nursing notes.   HISTORY  Chief Complaint Foot Pain; Knee Pain; and Elbow Pain    HPI Tonya Cabrera is a 66 y.o. female presenting to the emergency department with 7/10 bilateral feet pain. In addition, patient has left forearm pain and right knee pain. She states that her feet pain occurs after prolonged weightbearing activities and is relieved by rest. Patient denies recent trauma or changes in physical activity. 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 She also states that she hates to wear shoes. She often wears flip flops or slippers. Patient states that she presented to the emergency department tonight for feet pain. However, her left forearm and right knee have been sore also, so she would "like them checked out". She has been taking Ibuprofen for pain. Patient denies chest pain, chest tightness, shortness of breath, abdominal pain, nausea and vomiting.  Past Medical History:  Diagnosis Date  . Hypertension   . Myocardial infarction    2015  . Stroke [facility])    10-12 years ago    Patient Active Problem List   Diagnosis Date Noted  . Personal history of tobacco use, presenting hazards to health 07/11/2016  . Aortic atherosclerosis ([place]) 07/09/2016  . Osteoporosis 07/07/2016  . Myopia of both eyes 06/16/2016  . Hyperlipidemia 06/08/2016  . CAD (coronary artery disease) 06/08/2016  . Snoring 06/08/2016  . Essential hypertension 11/14/2015    History reviewed. No pertinent surgical history.  Prior to Admission medications   Medication Sig Start Date End Date Taking? Authorizing Provider  alendronate (FOSAMAX) 70 MG tablet Take 1 tablet (70 mg total) by mouth every 7 (seven) days. Take with a full glass of water on an empty  stomach. 07/08/16   Megan P Johnson, DO  aspirin EC 81 MG tablet Take 1 tablet (81 mg total) by mouth daily. 06/08/16   Megan P Johnson, DO  atorvastatin (LIPITOR) 40 MG tablet Take 1 tablet (40 mg total) by mouth daily. 06/08/16   Megan P Johnson, DO  clopidogrel (PLAVIX) 75 MG tablet Take 1 tablet (75 mg total) by mouth daily. 06/08/16   Megan P Johnson, DO  furosemide (LASIX) 20 MG tablet Take 1 tablet (20 mg total) by mouth every other day. 06/08/16   Megan P Johnson, DO  lisinopril (PRINIVIL,ZESTRIL) 10 MG tablet Take 1 tablet (10 mg total) by mouth daily. 06/08/16   Megan P Johnson, DO  meloxicam (MOBIC) 7.5 MG tablet Take 1 tablet (7.5 mg total) by mouth daily. 11/10/16 11/10/17  Lannie Fields, PA-C  metoprolol succinate (TOPROL-XL) 25 MG 24 hr tablet Take 1 tablet (25 mg total) by mouth 2 (two) times daily. 06/08/16   Megan P Johnson, DO  nitrofurantoin, macrocrystal-monohydrate, (MACROBID) 100 MG capsule Take 1 capsule (100 mg total) by mouth 2 (two) times daily. 06/11/16   Megan P Johnson, DO  zolpidem (AMBIEN) 5 MG tablet Take 1 tablet (5 mg total) by mouth at bedtime. 06/16/16   Laverle Hobby, MD    Allergies Patient has no known allergies.  Family History  Problem Relation Age of Onset  . Liver cancer Brother   . Heart Problems Mother   . Breast cancer Neg Hx     Social History Social History  Substance Use Topics  . Smoking status: Former Smoker  Packs/day: 1.50    Years: 22.00    Quit date: 05/08/2014  . Smokeless tobacco: Never Used     Comment: Smoked over 25 years, quit after Heart Attack in 2015.    Marland Kitchen Alcohol use 0.0 oz/week    Review of Systems  Constitutional: No fever/chills Cardiovascular: no chest pain. Respiratory: no cough. No SOB. Musculoskeletal: Patient has bilateral feet pain, left forearm pain and right knee pain.  Skin: Negative for rash, abrasions, lacerations, ecchymosis. Neurological: Negative for headaches, focal weakness or  numbness. ___________________________________________   PHYSICAL EXAM:  VITAL SIGNS: ED Triage Vitals [11/09/16 2131]  Enc Vitals Group     BP (!) 149/68     Pulse Rate 65     Resp 18     Temp 97.6 F (36.4 C)     Temp Source Oral     SpO2 99 %     Weight 135 lb (61.2 kg)     Height      Head Circumference      Peak Flow      Pain Score 7     Pain Loc      Pain Edu?      Excl. in [place]?    Constitutional: Alert and oriented. Well appearing and in no acute distress. Eyes: Conjunctivae are normal. PERRL. EOMI. Head: Atraumatic. Cardiovascular: Normal rate, regular rhythm. Normal S1 and S2.  Good peripheral circulation. Respiratory: Normal respiratory effort without tachypnea or retractions. Lungs CTAB. Good air entry to the bases with no decreased or absent breath sounds. Musculoskeletal: Patient has 5 out of 5 strength in the upper extremities bilaterally. Left forearm: Patient has tenderness with palpation of the lateral epicondyle. Patient has pain with resisted extension with the elbow extended. Right Knee: Right knee has peripatellar dimpling visualized. No posterior fullness. Negative anterior and posterior drawer test. Negative McMurray's. Patient has pain elicited with palpation along the medial joint line. Bilateral Feet: Patient is able to perform flexion and extension at the ankles bilaterally. Patient is able to move all toes bilaterally. Capillary refill is less than 2 seconds. Patient has pain elicited with palpation along the proximal insertion of the plantar fascia bilaterally. Palpable radial, ulnar and dorsalis pedis pulses bilaterally and symmetrically.  Neurologic:  Normal speech and language. No gross focal neurologic deficits are appreciated. Reflexes are 2+ and symmetric in the lower extremities bilaterally.  Skin:  Skin is warm, dry and intact. No rash noted. Psychiatric: Mood and affect are normal. Speech and behavior are normal. Patient exhibits appropriate  insight and judgement.   ____________________________________________   LABS (all labs ordered are listed, but only abnormal results are displayed)  Labs Reviewed - No data to display ____________________________________________  EKG   ____________________________________________  RADIOLOGY Unk Pinto, personally viewed and evaluated these images (plain radiographs) as part of my medical decision making, as well as reviewing the written report by the radiologist.    Dg Knee Complete 4 Views Right  Result Date: 11/10/2016 CLINICAL DATA:  Right knee pain below the patella, worse when standing for long periods of time. No specific injury. History of hypertension, stroke, former smoker. EXAM: RIGHT KNEE - COMPLETE 4+ VIEW COMPARISON:  None. FINDINGS: No evidence of fracture, dislocation, or joint effusion. No evidence of arthropathy or other focal bone abnormality. Soft tissues are unremarkable. IMPRESSION: Negative. Electronically Signed   By: Lucienne Capers M.D.   On: 11/10/2016 00:04    ____________________________________________    PROCEDURES  Procedure(s) performed:  Procedures    Medications  naproxen (NAPROSYN) tablet 500 mg (500 mg Oral Given 11/10/16 0013)     ____________________________________________   INITIAL IMPRESSION / ASSESSMENT AND PLAN / ED COURSE  Pertinent labs & imaging results that were available during my care of the patient were reviewed by me and considered in my medical decision making (see chart for details).  Review of the [place] CSRS was performed in accordance of the [place] prior to dispensing any controlled drugs.     Assessment and Plan Plantar fasciitis Right knee pain Lateral epicondylitis  Patient presents to the emergency department with bilateral feet pain, right knee pain and left forearm pain. Physical exam findings are consistent with bilateral plantar fasciitis and left lateral epicondylitis. Patient also  presents with right knee pain. DG right knee reveals no acute abnormalities. Patient has been taking Ibuprofen and tolerating it well. Patient was given naproxen in the emergency department. She was discharged with Mobic to be used as needed for pain and inflammation. A referral was made to orthopedics, Dr. Mack Guise. Patient was advised to follow up with orthopedics if right knee pain, left forearm pain, and bilateral feet pain persists. Patient education was provided regarding the importance of arch support in managing plantar fasciitis. All patient questions were answered.  ____________________________________________  FINAL CLINICAL IMPRESSION(S) / ED DIAGNOSES  Final diagnoses:  Acute pain of right knee  Plantar fasciitis  Lateral epicondylitis of left elbow      NEW MEDICATIONS STARTED DURING THIS VISIT:  New Prescriptions   MELOXICAM (MOBIC) 7.5 MG TABLET    Take 1 tablet (7.5 mg total) by mouth daily.        This chart was dictated using voice recognition software/Dragon. Despite best efforts to proofread, errors can occur which can change the meaning. Any change was purely unintentional.    Lannie Fields, PA-C 11/10/16 0035    Harvest Dark, MD 11/10/16 [id number]

## 2016-12-07 ENCOUNTER — Other Ambulatory Visit: Payer: Self-pay | Admitting: Family Medicine

## 2016-12-15 ENCOUNTER — Ambulatory Visit: Payer: PPO | Admitting: Family Medicine

## 2016-12-15 NOTE — Progress Notes (Deleted)
   There were no vitals taken for this visit.   Subjective:    Patient ID: Tonya Cabrera, female    DOB: 17-Sep-1951, 66 y.o.   MRN: [id number]  HPI: Tonya Cabrera is a 66 y.o. female  No chief complaint on file.  HYPERTENSION / HYPERLIPIDEMIA Satisfied with current treatment? {Blank single:19197::"yes","no"} Duration of hypertension: {Blank single:19197::"chronic","months","years"} BP monitoring frequency: {Blank single:19197::"not checking","rarely","daily","weekly","monthly","a few times a day","a few times a week","a few times a month"} BP range:  BP medication side effects: {Blank single:19197::"yes","no"} Past BP meds: {Blank DZHGDJME:26834::"HDQQ","IWLNLGXQJJ","HERDEYCXKG/YJEHUDJSHF","WYOVZCHY","IFOYDXAJOI","NOMVEHMCNO/BSJG","GEZMOQHUTM (bystolic)","carvedilol","chlorthalidone","clonidine","diltiazem","exforge HCT","HCTZ","irbesartan (avapro)","labetalol","lisinopril","lisinopril-HCTZ","losartan (cozaar)","methyldopa","nifedipine","olmesartan (benicar)","olmesartan-HCTZ","quinapril","ramipril","spironalactone","tekturna","valsartan","valsartan-HCTZ","verapamil"} Duration of hyperlipidemia: {Blank single:19197::"chronic","months","years"} Cholesterol medication side effects: {Blank single:19197::"yes","no"} Cholesterol supplements: {Blank multiple:19196::"none","fish oil","niacin","red yeast rice"} Past cholesterol medications: {Blank multiple:19196::"none","atorvastain (lipitor)","lovastatin (mevacor)","pravastatin (pravachol)","rosuvastatin (crestor)","simvastatin (zocor)","vytorin","fenofibrate (tricor)","gemfibrozil","ezetimide (zetia)","niaspan","lovaza"} Medication compliance: {Blank single:19197::"excellent compliance","good compliance","fair compliance","poor compliance"} Aspirin: {Blank single:19197::"yes","no"} Recent stressors: {Blank single:19197::"yes","no"} Recurrent headaches: {Blank single:19197::"yes","no"} Visual changes: {Blank  single:19197::"yes","no"} Palpitations: {Blank single:19197::"yes","no"} Dyspnea: {Blank single:19197::"yes","no"} Chest pain: {Blank single:19197::"yes","no"} Lower extremity edema: {Blank single:19197::"yes","no"} Dizzy/lightheaded: {Blank single:19197::"yes","no"}  Relevant past medical, surgical, family and social history reviewed and updated as indicated. Interim medical history since our last visit reviewed. Allergies and medications reviewed and updated.  Review of Systems  Per HPI unless specifically indicated above     Objective:    There were no vitals taken for this visit.  Wt Readings from Last 3 Encounters:  11/09/16 135 lb (61.2 kg)  09/15/16 136 lb (61.7 kg)  07/07/16 136 lb 12.8 oz (62.1 kg)    Physical Exam  Results for orders placed or performed in visit on 07/14/16  Cologuard  Result Value Ref Range   Cologuard Negative       Assessment & Plan:   Problem List Items Addressed This Visit      Cardiovascular and Mediastinum   Essential hypertension - Primary     Other   Hyperlipidemia    Other Visit Diagnoses    Need for Tdap vaccination       Need for vaccination with 13-polyvalent pneumococcal conjugate vaccine           Follow up plan: No Follow-up on file.

## 2017-01-25 ENCOUNTER — Ambulatory Visit (INDEPENDENT_AMBULATORY_CARE_PROVIDER_SITE_OTHER): Payer: Medicare Other | Admitting: Family Medicine

## 2017-01-25 ENCOUNTER — Encounter: Payer: Self-pay | Admitting: Family Medicine

## 2017-01-25 VITALS — BP 134/69 | HR 60 | Temp 98.1°F | Wt 131.4 lb

## 2017-01-25 DIAGNOSIS — I25119 Atherosclerotic heart disease of native coronary artery with unspecified angina pectoris: Secondary | ICD-10-CM

## 2017-01-25 DIAGNOSIS — I129 Hypertensive chronic kidney disease with stage 1 through stage 4 chronic kidney disease, or unspecified chronic kidney disease: Secondary | ICD-10-CM

## 2017-01-25 DIAGNOSIS — R739 Hyperglycemia, unspecified: Secondary | ICD-10-CM

## 2017-01-25 DIAGNOSIS — E782 Mixed hyperlipidemia: Secondary | ICD-10-CM

## 2017-01-25 DIAGNOSIS — I700 Atherosclerosis of aorta: Secondary | ICD-10-CM

## 2017-01-25 DIAGNOSIS — M545 Low back pain, unspecified: Secondary | ICD-10-CM

## 2017-01-25 MED ORDER — CYCLOBENZAPRINE HCL 10 MG PO TABS: 10.0000 mg | ORAL_TABLET | Freq: Every day | ORAL | 1 refills | Status: DC

## 2017-01-25 MED ORDER — NAPROXEN 500 MG PO TABS: 500.0000 mg | ORAL_TABLET | Freq: Two times a day (BID) | ORAL | 1 refills | Status: DC

## 2017-01-25 NOTE — Assessment & Plan Note (Signed)
Stable. Continue medical management. Continue plavix. Continue to monitor.
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
## 2017-01-25 NOTE — Progress Notes (Signed)
[facility]       Assessment and Plan:  Excessive daytime sleepiness.  --Suspect that this is secondary to insomnia and may also be affected by medications, metoprolol, Flexeril.  Insomnia.  --Sleep initiation and sleep maintenance type, continue ambien 5 mg qhs.   Anxiety. --Recommend referral to behavioral health for management of anxiety.   Essential hypertension.  --Sleep apnea can contribute to hypertension, will continue to monitor.   Snoring.  --Continue to snore, sleep study negative.    Date: 01/25/2017  MRN# [id number] KAMYRA SCHROECK 1951/03/27  Referring Physician:   DONNE ROBILLARD is a 66 y.o. old female seen in consultation for chief complaint of:    Chief Complaint  Patient presents with  . Follow-up    no sleep apnea per study; Patient reports still not sleeping well.  . Sleep Apnea    HPI:   The patient is a 66 yo female with a history of CAD, HTN. At last visit she was noted to have symptoms of excessive daytime sleepiness, she was referred for a sleep study which was negative for OSA. She was also noted to have sleep maintenance insomnia, and was started on ambien 5 mg qhs.  She took [place] which helped, the script ran out. She has otherwise been taking OTC sleep aids.  She goes to bed at 10, she tinks she falls asleep with ambien in an hour. With nothing she will take 2-3 hours to fall asleep.   At her last visit she noted that her anxiety was really bad due to family issues. She is on no antidepressants.   She smoked about 1 ppd until her heart attack in 2015. She is undergoing CT lung cancer screening.   Medication:   Reviewed.  Includes Flexeril, metoprolol   Allergies:  Patient has no known allergies.  Review of Systems: Gen:  Denies  fever, sweats, chills HEENT: Denies blurred vision, double vision. bleeds, sore throat Cvc:  No dizziness, chest pain. Resp:   Denies cough or sputum production, shortness of breath Gi:  Denies swallowing difficulty, stomach pain. Gu:  Denies bladder incontinence, burning urine Ext:   No Joint pain, stiffness. Skin: No skin rash,  hives  Endoc:  No polyuria, polydipsia. Psych: No depression, insomnia. Other:  All other systems were reviewed with the patient and were negative other that what is mentioned in the HPI.   Physical Examination:   VS: There were no vitals taken for this visit.  General Appearance: No distress  Neuro:without focal findings,  speech normal,  HEENT: PERRLA, EOM intact.   Pulmonary: normal breath sounds, No wheezing.  CardiovascularNormal S1,S2.  No m/r/g.   Abdomen: Benign, Soft, non-tender. Renal:  No costovertebral tenderness  GU:  No performed at this time. Endoc: No evident thyromegaly, no signs of acromegaly. Skin:   warm, no rashes, no ecchymosis  Extremities: normal, no cyanosis, clubbing.  Other findings:    LABORATORY PANEL:   CBC No results for input(s): WBC, HGB, HCT, PLT in the last 168 hours. ------------------------------------------------------------------------------------------------------------------  Chemistries  No results for input(s): NA, K, CL, CO2, GLUCOSE, BUN, CREATININE, CALCIUM, MG, AST, ALT, ALKPHOS, BILITOT in the last 168 hours.  Invalid input(s): GFRCGP ------------------------------------------------------------------------------------------------------------------  Cardiac Enzymes No results for input(s): TROPONINI in the last 168 hours. ------------------------------------------------------------  RADIOLOGY:  No results found.     Thank  you for the consultation and for allowing [facility] to assist in the care of your patient. Our  recommendations are noted above.  Please contact us if we can be of further service.   [name], MD.  Board Certified in Internal Medicine, Pulmonary Medicine, [place], and Sleep Medicine.  [facility] Office Number: [phone]  [name], M.D.  [name], M.D.  [name], M.D  01/25/2017

## 2017-01-25 NOTE — Patient Instructions (Addendum)
Stop your meloxicam and start the naproxen for your back  Back Exercises If you have pain in your back, do these exercises 2-3 times each day or as told by your doctor. When the pain goes away, do the exercises once each day, but repeat the steps more times for each exercise (do more repetitions). If you do not have pain in your back, do these exercises once each day or as told by your doctor. Exercises Single Knee to Chest   Do these steps 3-5 times in a row for each leg: 1. Lie on your back on a firm bed or the floor with your legs stretched out. 2. Bring one knee to your chest. 3. Hold your knee to your chest by grabbing your knee or thigh. 4. Pull on your knee until you feel a gentle stretch in your lower back. 5. Keep doing the stretch for 10-30 seconds. 6. Slowly let go of your leg and straighten it. Pelvic Tilt   Do these steps 5-10 times in a row: 1. Lie on your back on a firm bed or the floor with your legs stretched out. 2. Bend your knees so they point up to the ceiling. Your feet should be flat on the floor. 3. Tighten your lower belly (abdomen) muscles to press your lower back against the floor. This will make your tailbone point up to the ceiling instead of pointing down to your feet or the floor. 4. Stay in this position for 5-10 seconds while you gently tighten your muscles and breathe evenly. Cat-Cow   Do these steps until your lower back bends more easily: 1. Get on your hands and knees on a firm surface. Keep your hands under your shoulders, and keep your knees under your hips. You may put padding under your knees. 2. Let your head hang down, and make your tailbone point down to the floor so your lower back is round like the back of a cat. 3. Stay in this position for 5 seconds. 4. Slowly lift your head and make your tailbone point up to the ceiling so your back hangs low (sags) like the back of a cow. 5. Stay in this position for 5 seconds. Press-Ups   Do these  steps 5-10 times in a row: 1. Lie on your belly (face-down) on the floor. 2. Place your hands near your head, about shoulder-width apart. 3. While you keep your back relaxed and keep your hips on the floor, slowly straighten your arms to raise the top half of your body and lift your shoulders. Do not use your back muscles. To make yourself more comfortable, you may change where you place your hands. 4. Stay in this position for 5 seconds. 5. Slowly return to lying flat on the floor. Bridges   Do these steps 10 times in a row: 1. Lie on your back on a firm surface. 2. Bend your knees so they point up to the ceiling. Your feet should be flat on the floor. 3. Tighten your butt muscles and lift your butt off of the floor until your waist is almost as high as your knees. If you do not feel the muscles working in your butt and the back of your thighs, slide your feet 1-2 inches farther away from your butt. 4. Stay in this position for 3-5 seconds. 5. Slowly lower your butt to the floor, and let your butt muscles relax. If this exercise is too easy, try doing it with your arms crossed over  your chest. Belly Crunches   Do these steps 5-10 times in a row: 1. Lie on your back on a firm bed or the floor with your legs stretched out. 2. Bend your knees so they point up to the ceiling. Your feet should be flat on the floor. 3. Cross your arms over your chest. 4. Tip your chin a little bit toward your chest but do not bend your neck. 5. Tighten your belly muscles and slowly raise your chest just enough to lift your shoulder blades a tiny bit off of the floor. 6. Slowly lower your chest and your head to the floor. Back Lifts  Do these steps 5-10 times in a row: 1. Lie on your belly (face-down) with your arms at your sides, and rest your forehead on the floor. 2. Tighten the muscles in your legs and your butt. 3. Slowly lift your chest off of the floor while you keep your hips on the floor. Keep the back  of your head in line with the curve in your back. Look at the floor while you do this. 4. Stay in this position for 3-5 seconds. 5. Slowly lower your chest and your face to the floor. Contact a doctor if:  Your back pain gets a lot worse when you do an exercise.  Your back pain does not lessen 2 hours after you exercise. If you have any of these problems, stop doing the exercises. Do not do them again unless your doctor says it is okay. Get help right away if:  You have sudden, very bad back pain. If this happens, stop doing the exercises. Do not do them again unless your doctor says it is okay. This information is not intended to replace advice given to you by your health care provider. Make sure you discuss any questions you have with your health care provider. Document Released: 10/17/2010 Document Revised: 02/20/2016 Document Reviewed: 11/08/2014 Elsevier Interactive Patient Education  2017 [place] Injury Prevention Back injuries can be very painful. They can also be difficult to heal. After having one back injury, you are more likely to injure your back again. It is important to learn how to avoid injuring or re-injuring your back. The following tips can help you to prevent a back injury. What should I know about physical fitness?  Exercise for 30 minutes per day on most days of the week or as told by your doctor. Make sure to:  Do aerobic exercises, such as walking, jogging, biking, or swimming.  Do exercises that increase balance and strength, such as tai chi and yoga.  Do stretching exercises. This helps with flexibility.  Try to develop strong belly (abdominal) muscles. Your belly muscles help to support your back.  Stay at a healthy weight. This helps to decrease your risk of a back injury. What should I know about my diet?  Talk with your doctor about your overall diet. Take supplements and vitamins only as told by your doctor.  Talk with your doctor about  how much calcium and vitamin D you need each day. These nutrients help to prevent weakening of the bones (osteoporosis).  Include good sources of calcium in your diet, such as:  Dairy products.  Green leafy vegetables.  Products that have had calcium added to them (fortified).  Include good sources of vitamin D in your diet, such as:  Milk.  Foods that have had vitamin D added to them. What should I know about my posture?  Sit  up straight and stand up straight. Avoid leaning forward when you sit or hunching over when you stand.  Choose chairs that have good low-back (lumbar) support.  If you work at a desk, sit close to it so you do not need to lean over. Keep your chin tucked in. Keep your neck drawn back. Keep your elbows bent so your arms look like the letter "L" (right angle).  Sit high and close to the steering wheel when you drive. Add a low-back support to your car seat, if needed.  Avoid sitting or standing in one position for very long. Take breaks to get up, stretch, and walk around at least one time every hour. Take breaks every hour if you are driving for long periods of time.  Sleep on your side with your knees slightly bent, or sleep on your back with a pillow under your knees. Do not lie on the front of your body to sleep. What should I know about lifting, twisting, and reaching? Lifting and Heavy Lifting    Avoid heavy lifting, especially lifting over and over again. If you must do heavy lifting:  Stretch before lifting.  Work slowly.  Rest between lifts.  Use a tool such as a cart or a dolly to move objects if one is available.  Make several small trips instead of carrying one heavy load.  Ask for help when you need it, especially when moving big objects.  Follow these steps when lifting:  Stand with your feet shoulder-width apart.  Get as close to the object as you can. Do not pick up a heavy object that is far from your body.  Use handles or  lifting straps if they are available.  Bend at your knees. Squat down, but keep your heels off the floor.  Keep your shoulders back. Keep your chin tucked in. Keep your back straight.  Lift the object slowly while you tighten the muscles in your legs, belly, and butt. Keep the object as close to the center of your body as possible.  Follow these steps when putting down a heavy load:  Stand with your feet shoulder-width apart.  Lower the object slowly while you tighten the muscles in your legs, belly, and butt. Keep the object as close to the center of your body as possible.  Keep your shoulders back. Keep your chin tucked in. Keep your back straight.  Bend at your knees. Squat down, but keep your heels off the floor.  Use handles or lifting straps if they are available. Twisting and Reaching   Avoid lifting heavy objects above your waist.  Do not twist at your waist while you are lifting or carrying a load. If you need to turn, move your feet.  Do not bend over without bending at your knees.  Avoid reaching over your head, across a table, or for an object on a high surface. What are some other tips?  Avoid wet floors and icy ground. Keep sidewalks clear of ice to prevent falls.  Do not sleep on a mattress that is too soft or too hard.  Keep items that you use often within easy reach.  Put heavier objects on shelves at waist level, and put lighter objects on lower or higher shelves.  Find ways to lower your stress, such as:  Exercise.  Massage.  Relaxation techniques.  Talk with your doctor if you feel anxious or depressed. These conditions can make back pain worse.  Wear flat heel shoes with cushioned  soles.  Avoid making quick (sudden) movements.  Use both shoulder straps when carrying a backpack.  Do not use any tobacco products, including cigarettes, chewing tobacco, or electronic cigarettes. If you need help quitting, ask your doctor. This information is not  intended to replace advice given to you by your health care provider. Make sure you discuss any questions you have with your health care provider. Document Released: 03/02/2008 Document Revised: 02/20/2016 Document Reviewed: 09/18/2014 Elsevier Interactive Patient Education  2017 [employer].

## 2017-01-25 NOTE — Assessment & Plan Note (Signed)
Under good control. Continue current regimen. Continue to monitor. Call with any concerns. 

## 2017-01-26 ENCOUNTER — Encounter: Payer: Self-pay | Admitting: Family Medicine

## 2017-01-26 ENCOUNTER — Encounter: Payer: Self-pay | Admitting: Internal Medicine

## 2017-01-26 ENCOUNTER — Ambulatory Visit (INDEPENDENT_AMBULATORY_CARE_PROVIDER_SITE_OTHER): Payer: Medicare Other | Admitting: Internal Medicine

## 2017-01-26 VITALS — BP 110/80 | HR 61 | Resp 16 | Ht 63.0 in | Wt 131.0 lb

## 2017-01-26 DIAGNOSIS — F5105 Insomnia due to other mental disorder: Secondary | ICD-10-CM

## 2017-01-26 DIAGNOSIS — G4719 Other hypersomnia: Secondary | ICD-10-CM | POA: Diagnosis not present

## 2017-01-26 DIAGNOSIS — F99 Mental disorder, not otherwise specified: Secondary | ICD-10-CM | POA: Diagnosis not present

## 2017-01-26 DIAGNOSIS — F411 Generalized anxiety disorder: Secondary | ICD-10-CM

## 2017-01-26 LAB — COMPREHENSIVE METABOLIC PANEL
A/G RATIO: 1.8 (ref 1.2–2.2)
ALBUMIN: 4.4 g/dL (ref 3.6–4.8)
ALT: 21 IU/L (ref 0–32)
AST: 16 IU/L (ref 0–40)
Alkaline Phosphatase: 86 IU/L (ref 39–117)
BUN / CREAT RATIO: 24 (ref 12–28)
BUN: 19 mg/dL (ref 8–27)
Bilirubin Total: 1.6 mg/dL — ABNORMAL HIGH (ref 0.0–1.2)
CALCIUM: 10 mg/dL (ref 8.7–10.3)
CO2: 23 mmol/L (ref 18–29)
Chloride: 102 mmol/L (ref 96–106)
Creatinine, Ser: 0.8 mg/dL (ref 0.57–1.00)
GFR, EST AFRICAN AMERICAN: 89 mL/min/{1.73_m2} (ref >59)
GFR, EST NON AFRICAN AMERICAN: 78 mL/min/{1.73_m2} (ref >59)
Globulin, Total: 2.5 g/dL (ref 1.5–4.5)
Glucose: 82 mg/dL (ref 65–99)
POTASSIUM: 4.5 mmol/L (ref 3.5–5.2)
Sodium: 141 mmol/L (ref 134–144)
TOTAL PROTEIN: 6.9 g/dL (ref 6.0–8.5)

## 2017-01-26 LAB — LIPID PANEL W/O CHOL/HDL RATIO
Cholesterol, Total: 166 mg/dL (ref 100–199)
HDL: 61 mg/dL (ref >39)
LDL Calculated: 78 mg/dL (ref 0–99)
Triglycerides: 136 mg/dL (ref 0–149)
VLDL CHOLESTEROL CAL: 27 mg/dL (ref 5–40)

## 2017-01-26 LAB — HEMOGLOBIN A1C
ESTIMATED AVERAGE GLUCOSE: 105 mg/dL
Hgb A1c MFr Bld: 5.3 % (ref 4.8–5.6)

## 2017-01-26 MED ORDER — ZOLPIDEM TARTRATE 5 MG PO TABS: 5.0000 mg | ORAL_TABLET | Freq: Every evening | ORAL | 2 refills | Status: DC | PRN

## 2017-01-26 NOTE — Addendum Note (Signed)
Addended by: Oscar La R on: 01/26/2017 10:17 AM   Modules accepted: Orders

## 2017-01-26 NOTE — Addendum Note (Signed)
Addended by: Oscar La R on: 01/26/2017 10:20 AM   Modules accepted: Orders

## 2017-01-26 NOTE — Patient Instructions (Signed)
--  Will start lunesta once nightly, if not covered will start ambien 5 mg nightly.   --Refer to behavioral health to help manage stress/anxiety.

## 2017-02-15 ENCOUNTER — Ambulatory Visit: Payer: Medicare Other | Admitting: Family Medicine

## 2017-03-01 ENCOUNTER — Ambulatory Visit: Payer: Self-pay | Admitting: Psychology

## 2017-03-17 ENCOUNTER — Emergency Department
Admission: EM | Admit: 2017-03-17 | Discharge: 2017-03-17 | Disposition: A | Discharge status: Home / Self Care | Payer: Medicare Other | Attending: Emergency Medicine | Admitting: Emergency Medicine

## 2017-03-17 ENCOUNTER — Encounter: Payer: Self-pay | Admitting: *Deleted

## 2017-03-17 DIAGNOSIS — Z7982 Long term (current) use of aspirin: Secondary | ICD-10-CM | POA: Diagnosis not present

## 2017-03-17 DIAGNOSIS — S0101XA Laceration without foreign body of scalp, initial encounter: Secondary | ICD-10-CM | POA: Diagnosis not present

## 2017-03-17 DIAGNOSIS — Y92017 Garden or yard in single-family (private) house as the place of occurrence of the external cause: Secondary | ICD-10-CM | POA: Insufficient documentation

## 2017-03-17 DIAGNOSIS — Y998 Other external cause status: Secondary | ICD-10-CM | POA: Diagnosis not present

## 2017-03-17 DIAGNOSIS — Y93H2 Activity, gardening and landscaping: Secondary | ICD-10-CM | POA: Diagnosis not present

## 2017-03-17 DIAGNOSIS — I2510 Atherosclerotic heart disease of native coronary artery without angina pectoris: Secondary | ICD-10-CM | POA: Diagnosis not present

## 2017-03-17 DIAGNOSIS — W228XXA Striking against or struck by other objects, initial encounter: Secondary | ICD-10-CM | POA: Insufficient documentation

## 2017-03-17 DIAGNOSIS — Z87891 Personal history of nicotine dependence: Secondary | ICD-10-CM | POA: Diagnosis not present

## 2017-03-17 DIAGNOSIS — S0181XA Laceration without foreign body of other part of head, initial encounter: Secondary | ICD-10-CM

## 2017-03-17 DIAGNOSIS — Z79899 Other long term (current) drug therapy: Secondary | ICD-10-CM | POA: Diagnosis not present

## 2017-03-17 DIAGNOSIS — I10 Essential (primary) hypertension: Secondary | ICD-10-CM | POA: Insufficient documentation

## 2017-03-17 DIAGNOSIS — Z8673 Personal history of transient ischemic attack (TIA), and cerebral infarction without residual deficits: Secondary | ICD-10-CM | POA: Diagnosis not present

## 2017-03-17 DIAGNOSIS — I252 Old myocardial infarction: Secondary | ICD-10-CM | POA: Diagnosis not present

## 2017-03-17 MED ORDER — CEPHALEXIN 500 MG PO CAPS: 500.0000 mg | ORAL_CAPSULE | Freq: Four times a day (QID) | ORAL | 0 refills | Status: DC

## 2017-03-17 MED ORDER — LIDOCAINE HCL (PF) 1 % IJ SOLN
15.0000 mL | Freq: Once | INTRAMUSCULAR | Status: AC
  Administered 2017-03-17: 15 mL
  Filled 2017-03-17: qty 15

## 2017-03-17 NOTE — ED Provider Notes (Signed)
[facility] Emergency Department Provider Note  ____________________________________________  Time seen: Approximately 6:01 PM  I have reviewed the triage vital signs and the nursing notes.   HISTORY  Chief Complaint Laceration    HPI Tonya Cabrera is a 66 y.o. female who presents emergency department complaining of a laceration to her forehead. Patient reports that she was cutting small samplings in the back of her property when one sprang back and hit her in the forehead. Patient reports that she had immediate bleeding but did not have any loss of consciousness. Patient does have a history of stroke is on blood thinners. She denies any headache, visual changes, neck pain. Patient did not lose consciousness. Patient's last tetanus shot was 2 years prior. Patient reports that she is able control bleeding with direct pressure. No other complaints or injuries at this time.   Past Medical History:  Diagnosis Date  . Hypertension   . Myocardial infarction ([place])    2015  . Stroke [facility])    10-12 years ago    Patient Active Problem List   Diagnosis Date Noted  . Personal history of tobacco use, presenting hazards to health 07/11/2016  . Aortic atherosclerosis ([place]) 07/09/2016  . Osteoporosis 07/07/2016  . Myopia of both eyes 06/16/2016  . Hyperlipidemia 06/08/2016  . CAD (coronary artery disease) 06/08/2016  . Snoring 06/08/2016  . Benign hypertensive renal disease 11/14/2015    No past surgical history on file.  Prior to Admission medications   Medication Sig Start Date End Date Taking? Authorizing Provider  alendronate (FOSAMAX) 70 MG tablet Take 1 tablet (70 mg total) by mouth every 7 (seven) days. Take with a full glass of water on an empty stomach. 07/08/16   Johnson, Megan P, DO  aspirin EC 81 MG tablet Take 1 tablet (81 mg total) by mouth daily. 06/08/16   Johnson, Megan P, DO  atorvastatin (LIPITOR) 40 MG tablet TAKE 1 TABLET (40 MG TOTAL) BY  MOUTH DAILY. 12/07/16   Johnson, Megan P, DO  cephALEXin (KEFLEX) 500 MG capsule Take 1 capsule (500 mg total) by mouth 4 (four) times daily. 03/17/17   Lien Lyman, Charline Bills, PA-C  clopidogrel (PLAVIX) 75 MG tablet TAKE 1 TABLET (75 MG TOTAL) BY MOUTH DAILY. 12/07/16   Johnson, Megan P, DO  cyclobenzaprine (FLEXERIL) 10 MG tablet Take 1 tablet (10 mg total) by mouth at bedtime. 01/25/17   Johnson, Megan P, DO  furosemide (LASIX) 20 MG tablet Take 1 tablet (20 mg total) by mouth every other day. 06/08/16   Johnson, Megan P, DO  lisinopril (PRINIVIL,ZESTRIL) 10 MG tablet TAKE 1 TABLET (10 MG TOTAL) BY MOUTH DAILY. 12/07/16   Johnson, Megan P, DO  meloxicam (MOBIC) 7.5 MG tablet Take 1 tablet (7.5 mg total) by mouth daily. 11/10/16 11/10/17  Lannie Fields, PA-C  metoprolol succinate (TOPROL-XL) 25 MG 24 hr tablet TAKE 1 TABLET (25 MG TOTAL) BY MOUTH 2 (TWO) TIMES DAILY. 12/07/16   Johnson, Megan P, DO  naproxen (NAPROSYN) 500 MG tablet Take 1 tablet (500 mg total) by mouth 2 (two) times daily with a meal. 01/25/17   Johnson, Megan P, DO  zolpidem (AMBIEN) 5 MG tablet Take 1 tablet (5 mg total) by mouth at bedtime as needed for sleep. 01/26/17   Laverle Hobby, MD    Allergies Patient has no known allergies.  Family History  Problem Relation Age of Onset  . Liver cancer Brother   . Heart Problems Mother   .  Breast cancer Neg Hx     Social History Social History  Substance Use Topics  . Smoking status: Former Smoker    Packs/day: 1.50    Years: 22.00    Quit date: 05/08/2014  . Smokeless tobacco: Never Used     Comment: Smoked over 25 years, quit after Heart Attack in 2015.    Marland Kitchen Alcohol use 0.0 oz/week     Review of Systems  Constitutional: No fever/chills Eyes: No visual changes.  Cardiovascular: no chest pain. Respiratory: no cough. No SOB. Gastrointestinal: No abdominal pain.  No nausea, no vomiting.   Musculoskeletal: Negative for musculoskeletal pain. Skin: Positive for  laceration to the forehead/scalp Neurological: Negative for headaches, focal weakness or numbness. 10-point ROS otherwise negative.  ____________________________________________   PHYSICAL EXAM:  VITAL SIGNS: ED Triage Vitals  Enc Vitals Group     BP 03/17/17 1717 (!) 163/76     Pulse Rate 03/17/17 1717 75     Resp 03/17/17 1717 20     Temp 03/17/17 1717 97.8 F (36.6 C)     Temp Source 03/17/17 1717 Oral     SpO2 03/17/17 1717 99 %     Weight 03/17/17 1717 140 lb (63.5 kg)     Height 03/17/17 1717 5\' 3"  (1.6 m)     Head Circumference --      Peak Flow --      Pain Score 03/17/17 1716 6     Pain Loc --      Pain Edu? --      Excl. in [place]? --      Constitutional: Alert and oriented. Well appearing and in no acute distress. Eyes: Conjunctivae are normal. PERRL. EOMI. Head: 10 cm laceration in a "V" shaped originating just lateral of midline on the right side. This extends superiorly into the hairline. Edges are smooth the nature. Area is mildly oozing blood. No visible foreign body. Area is mildly tender to palpation but no tenderness to palpation in surrounding osseous structures of the skull or face. No battle signs. No raccoon eyes. No serosanguineous fluid drainage from the ears or nares. ENT:      Ears:       Nose: No congestion/rhinnorhea.      Mouth/Throat: Mucous membranes are moist.  Neck: No stridor.  No cervical spine tenderness to palpation  Cardiovascular: Normal rate, regular rhythm. Normal S1 and S2.  Good peripheral circulation. Respiratory: Normal respiratory effort without tachypnea or retractions. Lungs CTAB. Good air entry to the bases with no decreased or absent breath sounds. Musculoskeletal: Full range of motion to all extremities. No gross deformities appreciated. Neurologic:  Normal speech and language. No gross focal neurologic deficits are appreciated.  Skin:  Skin is warm, dry and intact. No rash noted. Psychiatric: Mood and affect are normal.  Speech and behavior are normal. Patient exhibits appropriate insight and judgement.   ____________________________________________   LABS (all labs ordered are listed, but only abnormal results are displayed)  Labs Reviewed - No data to display ____________________________________________  EKG   ____________________________________________  RADIOLOGY     No results found.  ____________________________________________    PROCEDURES  Procedure(s) performed:    Marland KitchenMarland KitchenLaceration Repair Date/Time: 03/17/2017 8:23 PM Performed by: Betha Loa D Authorized by: Betha Loa D   Consent:    Consent obtained:  Verbal   Consent given by:  Patient   Risks discussed:  Pain and poor cosmetic result Anesthesia (see MAR for exact dosages):    Anesthesia method:  Local infiltration   Local anesthetic:  Lidocaine 1% w/o epi Laceration details:    Location:  Scalp   Scalp location:  Frontal   Length (cm):  10 Repair type:    Repair type:  Simple Exploration:    Hemostasis achieved with:  Direct pressure   Wound exploration: wound explored through full range of motion and entire depth of wound probed and visualized     Wound extent: no foreign bodies/material noted     Contaminated: no   Treatment:    Area cleansed with:  Betadine and Shur-Clens   Amount of cleaning:  Extensive   Irrigation solution:  Sterile saline   Irrigation method:  Syringe Skin repair:    Repair method:  Sutures   Suture size:  4-0   Suture material:  Nylon (and prolene)   Suture technique:  Simple interrupted   Number of sutures:  24 Approximation:    Approximation:  Close Post-procedure details:    Dressing:  Open (no dressing)   Patient tolerance of procedure:  Tolerated well, no immediate complications      Medications  lidocaine (PF) (XYLOCAINE) 1 % injection 15 mL (15 mLs Infiltration Given by Other 03/17/17 2010)      ____________________________________________   INITIAL IMPRESSION / ASSESSMENT AND PLAN / ED COURSE  Pertinent labs & imaging results that were available during my care of the patient were reviewed by me and considered in my medical decision making (see chart for details).  Review of the [place] CSRS was performed in accordance of the [place] prior to dispensing any controlled drugs.     Patient's diagnosis is consistent with a laceration to the forehead. Patient had large laceration to the frontal region of the scalp. This is closed as described above. Patient tolerated well. Patient was on blood thinners which caused laceration to bleed, however there is no indication for head injury requiring imaging at this time. Wound care structures are given to patient.. Patient will be discharged home with prescriptions for antibiotics prophylactically due to the nature and size of injury.. Patient is to follow up with primary care in 1 week for suture removal or as needed. Patient is given ED precautions to return to the ED for any worsening or new symptoms.     ____________________________________________  FINAL CLINICAL IMPRESSION(S) / ED DIAGNOSES  Final diagnoses:  Facial laceration, initial encounter      NEW MEDICATIONS STARTED DURING THIS VISIT:  Discharge Medication List as of 03/17/2017  7:58 PM    START taking these medications   Details  cephALEXin (KEFLEX) 500 MG capsule Take 1 capsule (500 mg total) by mouth 4 (four) times daily., Starting Wed 03/17/2017, Print            This chart was dictated using voice recognition software/Dragon. Despite best efforts to proofread, errors can occur which can change the meaning. Any change was purely unintentional.    Darletta Moll, PA-C 03/17/17 2025    Schuyler Amor, MD 03/17/17 2356

## 2017-03-17 NOTE — ED Triage Notes (Signed)
Pt to triage via wheelchair. Pt has a laceration to right side of forehead.  Pt was cutting tree branches and it struck pt in the head.  No loc.  Bleeding controlled.   Pt alert.

## 2017-03-25 ENCOUNTER — Ambulatory Visit (INDEPENDENT_AMBULATORY_CARE_PROVIDER_SITE_OTHER): Payer: Medicare Other | Admitting: Family Medicine

## 2017-03-25 ENCOUNTER — Encounter: Payer: Self-pay | Admitting: Family Medicine

## 2017-03-25 ENCOUNTER — Ambulatory Visit: Payer: Medicare Other | Admitting: Family Medicine

## 2017-03-25 VITALS — BP 156/74 | HR 73 | Temp 97.9°F | Wt 132.0 lb

## 2017-03-25 DIAGNOSIS — Z4802 Encounter for removal of sutures: Secondary | ICD-10-CM

## 2017-03-25 NOTE — Progress Notes (Signed)
   BP (!) 156/74   Pulse 73   Temp 97.9 F (36.6 C)   Wt 132 lb (59.9 kg)   SpO2 100%   BMI 23.38 kg/m    Subjective:    Patient ID: Tonya Cabrera, female    DOB: 01-12-1951, 66 y.o.   MRN: [id number]  HPI: Tonya Cabrera is a 66 y.o. female  Chief Complaint  Patient presents with  . Suture / Staple Removal    She had sutures placed in the ED last week. No issues with it. No pain. Will finish the antibiotic today.   Patient presents today for suture removal. Large, V shaped lac to scalp from some branches she had just trimmed. Seen in ED and had 24 sutures placed with complication. Healing well, pain under great control. Performing good wound care. Denies fever, chills, sweats, discharge from area.   Relevant past medical, surgical, family and social history reviewed and updated as indicated. Interim medical history since our last visit reviewed. Allergies and medications reviewed and updated.  Review of Systems  Constitutional: Negative.   HENT: Negative.   Respiratory: Negative.   Cardiovascular: Negative.   Gastrointestinal: Negative.   Genitourinary: Negative.   Musculoskeletal: Negative.   Neurological: Negative.  Negative for headaches.  Psychiatric/Behavioral: Negative.    Per HPI unless specifically indicated above     Objective:    BP (!) 156/74   Pulse 73   Temp 97.9 F (36.6 C)   Wt 132 lb (59.9 kg)   SpO2 100%   BMI 23.38 kg/m   Wt Readings from Last 3 Encounters:  03/25/17 132 lb (59.9 kg)  03/17/17 140 lb (63.5 kg)  01/26/17 131 lb (59.4 kg)    Physical Exam  Constitutional: She is oriented to person, place, and time. She appears well-developed and well-nourished.  HENT:  Head: Atraumatic.  Eyes: Conjunctivae are normal. Pupils are equal, round, and reactive to light.  Neck: Normal range of motion. Neck supple.  Cardiovascular: Normal rate and normal heart sounds.   Musculoskeletal: Normal range of motion.  Neurological: She is alert and  oriented to person, place, and time.  Skin: Skin is warm and dry.  10 cm V shaped lac of scalp healing well, no erythema or purulent drainage. Some scabbing  Psychiatric: She has a normal mood and affect.  Nursing note and vitals reviewed.  Procedure Note: Suture removal, scalp lac Forceps and small scissors used to remove 24 simple interrupted sutures. Well tolerated, no immediate complications. Area was dressed and wound care discussed. Pt verbalized understanding.      Assessment & Plan:   Problem List Items Addressed This Visit    None    Visit Diagnoses    Visit for suture removal    -  Primary   Sutures removed without complication. Area healing very well. Continue good wound care, return precautions reviewed and pt understanding.        Follow up plan: Return if symptoms worsen or fail to improve.

## 2017-04-20 ENCOUNTER — Other Ambulatory Visit: Payer: Self-pay | Admitting: Family Medicine

## 2017-04-29 ENCOUNTER — Ambulatory Visit: Payer: Medicare Other

## 2017-04-30 ENCOUNTER — Telehealth: Payer: Self-pay

## 2017-04-30 NOTE — Telephone Encounter (Signed)
Called to reschedule missed AWV. Rescheduled and confirmed with patient

## 2017-05-19 ENCOUNTER — Telehealth: Payer: Self-pay | Admitting: Family Medicine

## 2017-05-19 NOTE — Telephone Encounter (Signed)
Can you please find out what she took?

## 2017-05-19 NOTE — Telephone Encounter (Signed)
She should be fine based on her doses. She should not take anything else today. If she starts feeling dizzy or starts bleeding she should go to the ER. Otherwise she should be fine.

## 2017-05-19 NOTE — Telephone Encounter (Signed)
Patient has taken 2 days of her heart med and BP medication.  She took the first dose at 8 and the second dose @ 11:15 she is concerned.  Please advise what you recommend  Thanks  [phone]

## 2017-05-19 NOTE — Telephone Encounter (Signed)
Spoke with patient, she took double of metoprolol, lisinopril, aspirin, plavix,, all of her prescription medications.

## 2017-05-29 ENCOUNTER — Other Ambulatory Visit: Payer: Self-pay | Admitting: Family Medicine

## 2017-05-31 ENCOUNTER — Other Ambulatory Visit: Payer: Self-pay | Admitting: Family Medicine

## 2017-06-02 ENCOUNTER — Ambulatory Visit (INDEPENDENT_AMBULATORY_CARE_PROVIDER_SITE_OTHER): Payer: Medicare Other

## 2017-06-02 VITALS — BP 123/73 | HR 78 | Temp 97.9°F | Resp 16 | Ht 63.0 in | Wt 133.9 lb

## 2017-06-02 DIAGNOSIS — Z0000 Encounter for general adult medical examination without abnormal findings: Secondary | ICD-10-CM | POA: Diagnosis not present

## 2017-06-02 NOTE — Patient Instructions (Signed)
Tonya Cabrera , Thank you for taking time to come for your Medicare Wellness Visit. I appreciate your ongoing commitment to your health goals. Please review the following plan we discussed and let me know if I can assist you in the future.   Screening recommendations/referrals: Colonoscopy: cologuard done 06/2016 Mammogram: completed 07/07/2016 Bone Density: completed 07/07/2016 Recommended yearly ophthalmology/optometry visit for glaucoma screening and checkup Recommended yearly dental visit for hygiene and checkup  Vaccinations: Influenza vaccine: due now- declined Pneumococcal vaccine: pneumo 23 done 05/05/2013 , decline prevnar 13  Tdap vaccine: up to date Shingles vaccine: due, check with your insurance company for coverage  Advanced directives: Advance directive discussed with you today. I have provided a copy for you to complete at home and have notarized. Once this is complete please bring a copy in to our office so we can scan it into your chart.  Conditions/risks identified: Recommend using a calendar to mark through as you take your medications daily.   Next appointment: Follow up on 06/22/2017 at 10:00am with Dr.Johnson. Follow up in one year for your annual wellness exam.    Preventive Care 65 Years and Older, Female Preventive care refers to lifestyle choices and visits with your health care provider that can promote health and wellness. What does preventive care include?  A yearly physical exam. This is also called an annual well check.  Dental exams once or twice a year.  Routine eye exams. Ask your health care provider how often you should have your eyes checked.  Personal lifestyle choices, including:  Daily care of your teeth and gums.  Regular physical activity.  Eating a healthy diet.  Avoiding tobacco and drug use.  Limiting alcohol use.  Practicing safe sex.  Taking low-dose aspirin every day.  Taking vitamin and mineral supplements as recommended by  your health care provider. What happens during an annual well check? The services and screenings done by your health care provider during your annual well check will depend on your age, overall health, lifestyle risk factors, and family history of disease. Counseling  Your health care provider may ask you questions about your:  Alcohol use.  Tobacco use.  Drug use.  Emotional well-being.  Home and relationship well-being.  Sexual activity.  Eating habits.  History of falls.  Memory and ability to understand (cognition).  Work and work Statistician.  Reproductive health. Screening  You may have the following tests or measurements:  Height, weight, and BMI.  Blood pressure.  Lipid and cholesterol levels. These may be checked every 5 years, or more frequently if you are over 42 years old.  Skin check.  Lung cancer screening. You may have this screening every year starting at age 19 if you have a 30-pack-year history of smoking and currently smoke or have quit within the past 15 years.  Fecal occult blood test (FOBT) of the stool. You may have this test every year starting at age 82.  Flexible sigmoidoscopy or colonoscopy. You may have a sigmoidoscopy every 5 years or a colonoscopy every 10 years starting at age 59.  Hepatitis C blood test.  Hepatitis B blood test.  Sexually transmitted disease (STD) testing.  Diabetes screening. This is done by checking your blood sugar (glucose) after you have not eaten for a while (fasting). You may have this done every 1-3 years.  Bone density scan. This is done to screen for osteoporosis. You may have this done starting at age 56.  Mammogram. This may be done every  1-2 years. Talk to your health care provider about how often you should have regular mammograms. Talk with your health care provider about your test results, treatment options, and if necessary, the need for more tests. Vaccines  Your health care provider may  recommend certain vaccines, such as:  Influenza vaccine. This is recommended every year.  Tetanus, diphtheria, and acellular pertussis (Tdap, Td) vaccine. You may need a Td booster every 10 years.  Zoster vaccine. You may need this after age 21.  Pneumococcal 13-valent conjugate (PCV13) vaccine. One dose is recommended after age 62.  Pneumococcal polysaccharide (PPSV23) vaccine. One dose is recommended after age 76. Talk to your health care provider about which screenings and vaccines you need and how often you need them. This information is not intended to replace advice given to you by your health care provider. Make sure you discuss any questions you have with your health care provider. Document Released: 10/11/2015 Document Revised: 06/03/2016 Document Reviewed: 07/16/2015 Elsevier Interactive Patient Education  2017 [place] Prevention in the Home Falls can cause injuries. They can happen to people of all ages. There are many things you can do to make your home safe and to help prevent falls. What can I do on the outside of my home?  Regularly fix the edges of walkways and driveways and fix any cracks.  Remove anything that might make you trip as you walk through a door, such as a raised step or threshold.  Trim any bushes or trees on the path to your home.  Use bright outdoor lighting.  Clear any walking paths of anything that might make someone trip, such as rocks or tools.  Regularly check to see if handrails are loose or broken. Make sure that both sides of any steps have handrails.  Any raised decks and porches should have guardrails on the edges.  Have any leaves, snow, or ice cleared regularly.  Use sand or salt on walking paths during winter.  Clean up any spills in your garage right away. This includes oil or grease spills. What can I do in the bathroom?  Use night lights.  Install grab bars by the toilet and in the tub and shower. Do not use towel  bars as grab bars.  Use non-skid mats or decals in the tub or shower.  If you need to sit down in the shower, use a plastic, non-slip stool.  Keep the floor dry. Clean up any water that spills on the floor as soon as it happens.  Remove soap buildup in the tub or shower regularly.  Attach bath mats securely with double-sided non-slip rug tape.  Do not have throw rugs and other things on the floor that can make you trip. What can I do in the bedroom?  Use night lights.  Make sure that you have a light by your bed that is easy to reach.  Do not use any sheets or blankets that are too big for your bed. They should not hang down onto the floor.  Have a firm chair that has side arms. You can use this for support while you get dressed.  Do not have throw rugs and other things on the floor that can make you trip. What can I do in the kitchen?  Clean up any spills right away.  Avoid walking on wet floors.  Keep items that you use a lot in easy-to-reach places.  If you need to reach something above you, use a strong  step stool that has a grab bar.  Keep electrical cords out of the way.  Do not use floor polish or wax that makes floors slippery. If you must use wax, use non-skid floor wax.  Do not have throw rugs and other things on the floor that can make you trip. What can I do with my stairs?  Do not leave any items on the stairs.  Make sure that there are handrails on both sides of the stairs and use them. Fix handrails that are broken or loose. Make sure that handrails are as long as the stairways.  Check any carpeting to make sure that it is firmly attached to the stairs. Fix any carpet that is loose or worn.  Avoid having throw rugs at the top or bottom of the stairs. If you do have throw rugs, attach them to the floor with carpet tape.  Make sure that you have a light switch at the top of the stairs and the bottom of the stairs. If you do not have them, ask someone to  add them for you. What else can I do to help prevent falls?  Wear shoes that:  Do not have high heels.  Have rubber bottoms.  Are comfortable and fit you well.  Are closed at the toe. Do not wear sandals.  If you use a stepladder:  Make sure that it is fully opened. Do not climb a closed stepladder.  Make sure that both sides of the stepladder are locked into place.  Ask someone to hold it for you, if possible.  Clearly mark and make sure that you can see:  Any grab bars or handrails.  First and last steps.  Where the edge of each step is.  Use tools that help you move around (mobility aids) if they are needed. These include:  Canes.  Walkers.  Scooters.  Crutches.  Turn on the lights when you go into a dark area. Replace any light bulbs as soon as they burn out.  Set up your furniture so you have a clear path. Avoid moving your furniture around.  If any of your floors are uneven, fix them.  If there are any pets around you, be aware of where they are.  Review your medicines with your doctor. Some medicines can make you feel dizzy. This can increase your chance of falling. Ask your doctor what other things that you can do to help prevent falls. This information is not intended to replace advice given to you by your health care provider. Make sure you discuss any questions you have with your health care provider. Document Released: 07/11/2009 Document Revised: 02/20/2016 Document Reviewed: 10/19/2014 Elsevier Interactive Patient Education  2017 [employer].

## 2017-06-02 NOTE — Progress Notes (Signed)
Subjective:   Tonya Cabrera is a 66 y.o. female who presents for Medicare Annual (Subsequent) preventive examination.  Review of Systems:  Cardiac Risk Factors include: advanced age (>54men, >10 women);hypertension;dyslipidemia     Objective:     Vitals: BP 123/73 (BP Location: Left Arm, Patient Position: Sitting)   Pulse 78   Temp 97.9 F (36.6 C)   Resp 16   Ht 5\' 3"  (1.6 m)   Wt 133 lb 14.4 oz (60.7 kg)   BMI 23.72 kg/m   Body mass index is 23.72 kg/m.   Tobacco History  Smoking Status  . Former Smoker  . Packs/day: 1.50  . Years: 22.00  . Quit date: 05/08/2014  Smokeless Tobacco  . Never Used    Comment: Smoked over 25 years, quit after Heart Attack in 2015.       Counseling given: Not Answered   Past Medical History:  Diagnosis Date  . Hypertension   . Myocardial infarction ([place])    2015  . Stroke [facility])    10-12 years ago   Past Surgical History:  Procedure Laterality Date  . CAROTID STENT    . INCONTINENCE SURGERY    . THYROID SURGERY     Family History  Problem Relation Age of Onset  . Liver cancer Brother   . Heart Problems Mother   . Breast cancer Neg Hx    History  Sexual Activity  . Sexual activity: Not Currently    Outpatient Encounter Prescriptions as of 06/02/2017  Medication Sig  . alendronate (FOSAMAX) 70 MG tablet TAKE ONE TABLET BY MOUTH EVERY 7 DAYS ON EMPTY STOMACH WITH FULL GLASS OF WATER  . aspirin EC 81 MG tablet Take 1 tablet (81 mg total) by mouth daily.  Marland Kitchen atorvastatin (LIPITOR) 40 MG tablet TAKE 1 TABLET (40 MG TOTAL) BY MOUTH DAILY.  Marland Kitchen clopidogrel (PLAVIX) 75 MG tablet TAKE 1 TABLET (75 MG TOTAL) BY MOUTH DAILY.  . cyclobenzaprine (FLEXERIL) 10 MG tablet TAKE 1 TABLET BY MOUTH AT BEDTIME  . lisinopril (PRINIVIL,ZESTRIL) 10 MG tablet TAKE 1 TABLET (10 MG TOTAL) BY MOUTH DAILY.  . metoprolol succinate (TOPROL-XL) 25 MG 24 hr tablet TAKE 1 TABLET (25 MG TOTAL) BY MOUTH 2 (TWO) TIMES DAILY.  . naproxen (NAPROSYN) 500 MG  tablet TAKE 1 TABLET (500 MG TOTAL) BY MOUTH 2 (TWO) TIMES DAILY WITH A MEAL.  . furosemide (LASIX) 20 MG tablet Take 1 tablet (20 mg total) by mouth every other day. (Patient not taking: Reported on 06/02/2017)  . [DISCONTINUED] cephALEXin (KEFLEX) 500 MG capsule Take 1 capsule (500 mg total) by mouth 4 (four) times daily. (Patient not taking: Reported on 06/02/2017)  . [DISCONTINUED] meloxicam (MOBIC) 7.5 MG tablet Take 1 tablet (7.5 mg total) by mouth daily. (Patient not taking: Reported on 06/02/2017)   No facility-administered encounter medications on file as of 06/02/2017.     Activities of Daily Living In your present state of health, do you have any difficulty performing the following activities: 06/02/2017 06/16/2016  Hearing? Tempie Donning  Vision? Y Y  Comment having cataract surgey in November  -  Difficulty concentrating or making decisions? N Y  Walking or climbing stairs? Y N  Dressing or bathing? N N  Doing errands, shopping? N N  Preparing Food and eating ? N -  Using the Toilet? N -  In the past six months, have you accidently leaked urine? N -  Do you have problems with loss of bowel control? N -  Managing your Medications? N -  Managing your Finances? N -  Housekeeping or managing your Housekeeping? N -  Some recent data might be hidden    Patient Care Team: Valerie Roys, DO as PCP - General (Family Medicine)    Assessment:     Exercise Activities and Dietary recommendations Current Exercise Habits: The patient does not participate in regular exercise at present, Exercise limited by: None identified  Goals    None     Fall Risk Fall Risk  06/02/2017 01/25/2017  Falls in the past year? No Yes  Number falls in past yr: - 2 or more  Injury with Fall? - Yes   Depression Screen PHQ 2/9 Scores 06/02/2017 01/25/2017 06/08/2016  PHQ - 2 Score 2 3 0  PHQ- 9 Score 6 9 -     Cognitive Function     6CIT Screen 06/02/2017  What Year? 0 points  What month? 0 points  What time? 0  points  Count back from 20 0 points  Months in reverse 0 points  Repeat phrase 0 points  Total Score 0     There is no immunization history on file for this patient. Screening Tests Health Maintenance  Topic Date Due  . TETANUS/TDAP  07/27/2017 (Originally 04/28/1970)  . PNA vac Low Risk Adult (1 of 2 - PCV13) 07/27/2017 (Originally 04/28/2016)  . INFLUENZA VACCINE  05/29/2018 (Originally 04/28/2017)  . MAMMOGRAM  07/07/2018  . Fecal DNA (Cologuard)  07/02/2019  . DEXA SCAN  Completed  . Hepatitis C Screening  Completed      Plan:     I have personally reviewed and addressed the Medicare Annual Wellness questionnaire and have noted the following in the patient's chart:  A. Medical and social history B. Use of alcohol, tobacco or illicit drugs  C. Current medications and supplements D. Functional ability and status E.  Nutritional status F.  Physical activity G. Advance directives H. List of other physicians I.  Hospitalizations, surgeries, and ER visits in previous 12 months J.  [place] such as hearing and vision if needed, cognitive and depression L. Referrals and appointments   In addition, I have reviewed and discussed with patient certain preventive protocols, quality metrics, and best practice recommendations. A written personalized care plan for preventive services as well as general preventive health recommendations were provided to patient.   Signed,  Tyler Aas, LPN Nurse Health Advisor   MD Recommendations:none

## 2017-06-15 ENCOUNTER — Other Ambulatory Visit: Payer: Self-pay | Admitting: Family Medicine

## 2017-06-22 ENCOUNTER — Ambulatory Visit (INDEPENDENT_AMBULATORY_CARE_PROVIDER_SITE_OTHER): Payer: Medicare Other | Admitting: Family Medicine

## 2017-06-22 ENCOUNTER — Encounter: Payer: Self-pay | Admitting: Family Medicine

## 2017-06-22 VITALS — BP 135/79 | HR 73 | Temp 98.1°F | Ht 63.0 in | Wt 132.1 lb

## 2017-06-22 DIAGNOSIS — N390 Urinary tract infection, site not specified: Secondary | ICD-10-CM | POA: Diagnosis not present

## 2017-06-22 DIAGNOSIS — G8929 Other chronic pain: Secondary | ICD-10-CM

## 2017-06-22 DIAGNOSIS — R8281 Pyuria: Secondary | ICD-10-CM

## 2017-06-22 DIAGNOSIS — M545 Low back pain: Secondary | ICD-10-CM

## 2017-06-22 DIAGNOSIS — E782 Mixed hyperlipidemia: Secondary | ICD-10-CM

## 2017-06-22 DIAGNOSIS — Z0000 Encounter for general adult medical examination without abnormal findings: Secondary | ICD-10-CM

## 2017-06-22 DIAGNOSIS — Z1239 Encounter for other screening for malignant neoplasm of breast: Secondary | ICD-10-CM

## 2017-06-22 DIAGNOSIS — R413 Other amnesia: Secondary | ICD-10-CM | POA: Diagnosis not present

## 2017-06-22 DIAGNOSIS — D229 Melanocytic nevi, unspecified: Secondary | ICD-10-CM

## 2017-06-22 DIAGNOSIS — I129 Hypertensive chronic kidney disease with stage 1 through stage 4 chronic kidney disease, or unspecified chronic kidney disease: Secondary | ICD-10-CM

## 2017-06-22 LAB — MICROSCOPIC EXAMINATION

## 2017-06-22 MED ORDER — CLOPIDOGREL BISULFATE 75 MG PO TABS: 75.0000 mg | ORAL_TABLET | Freq: Every day | ORAL | 1 refills | Status: DC

## 2017-06-22 MED ORDER — NAPROXEN 500 MG PO TABS: 500.0000 mg | ORAL_TABLET | Freq: Two times a day (BID) | ORAL | 1 refills | Status: DC

## 2017-06-22 MED ORDER — ATORVASTATIN CALCIUM 40 MG PO TABS: 40.0000 mg | ORAL_TABLET | Freq: Every day | ORAL | 1 refills | Status: DC

## 2017-06-22 MED ORDER — LISINOPRIL 10 MG PO TABS: 10.0000 mg | ORAL_TABLET | Freq: Every day | ORAL | 1 refills | Status: DC

## 2017-06-22 MED ORDER — ALENDRONATE SODIUM 70 MG PO TABS: ORAL_TABLET | ORAL | 11 refills | Status: DC

## 2017-06-22 MED ORDER — METOPROLOL SUCCINATE ER 25 MG PO TB24: 25.0000 mg | ORAL_TABLET | Freq: Two times a day (BID) | ORAL | 1 refills | Status: DC

## 2017-06-22 NOTE — Patient Instructions (Addendum)
Health Maintenance, Female Adopting a healthy lifestyle and getting preventive care can go a long way to promote health and wellness. Talk with your health care provider about what schedule of regular examinations is right for you. This is a good chance for you to check in with your provider about disease prevention and staying healthy. In between checkups, there are plenty of things you can do on your own. Experts have done a lot of research about which lifestyle changes and preventive measures are most likely to keep you healthy. Ask your health care provider for more information. Weight and diet Eat a healthy diet  Be sure to include plenty of vegetables, fruits, low-fat dairy products, and lean protein.  Do not eat a lot of foods high in solid fats, added sugars, or salt.  Get regular exercise. This is one of the most important things you can do for your health. ? Most adults should exercise for at least 150 minutes each week. The exercise should increase your heart rate and make you sweat (moderate-intensity exercise). ? Most adults should also do strengthening exercises at least twice a week. This is in addition to the moderate-intensity exercise.  Maintain a healthy weight  Body mass index (BMI) is a measurement that can be used to identify possible weight problems. It estimates body fat based on height and weight. Your health care provider can help determine your BMI and help you achieve or maintain a healthy weight.  For females 20 years of age and older: ? A BMI below 18.5 is considered underweight. ? A BMI of 18.5 to 24.9 is normal. ? A BMI of 25 to 29.9 is considered overweight. ? A BMI of 30 and above is considered obese.  Watch levels of cholesterol and blood lipids  You should start having your blood tested for lipids and cholesterol at 66 years of age, then have this test every 5 years.  You may need to have your cholesterol levels checked more often if: ? Your lipid or  cholesterol levels are high. ? You are older than 66 years of age. ? You are at high risk for heart disease.  Cancer screening Lung Cancer  Lung cancer screening is recommended for adults 55-80 years old who are at high risk for lung cancer because of a history of smoking.  A yearly low-dose CT scan of the lungs is recommended for people who: ? Currently smoke. ? Have quit within the past 15 years. ? Have at least a 30-pack-year history of smoking. A pack year is smoking an average of one pack of cigarettes a day for 1 year.  Yearly screening should continue until it has been 15 years since you quit.  Yearly screening should stop if you develop a health problem that would prevent you from having lung cancer treatment.  Breast Cancer  Practice breast self-awareness. This means understanding how your breasts normally appear and feel.  It also means doing regular breast self-exams. Let your health care provider know about any changes, no matter how small.  If you are in your 20s or 30s, you should have a clinical breast exam (CBE) by a health care provider every 1-3 years as part of a regular health exam.  If you are 40 or older, have a CBE every year. Also consider having a breast X-ray (mammogram) every year.  If you have a family history of breast cancer, talk to your health care provider about genetic screening.  If you are at high risk   for breast cancer, talk to your health care provider about having an MRI and a mammogram every year.  Breast cancer gene (BRCA) assessment is recommended for women who have family members with BRCA-related cancers. BRCA-related cancers include: ? Breast. ? Ovarian. ? Tubal. ? Peritoneal cancers.  Results of the assessment will determine the need for genetic counseling and BRCA1 and BRCA2 testing.  Cervical Cancer Your health care provider may recommend that you be screened regularly for cancer of the pelvic organs (ovaries, uterus, and  vagina). This screening involves a pelvic examination, including checking for microscopic changes to the surface of your cervix (Pap test). You may be encouraged to have this screening done every 3 years, beginning at age 22.  For women ages 56-65, health care providers may recommend pelvic exams and Pap testing every 3 years, or they may recommend the Pap and pelvic exam, combined with testing for human papilloma virus (HPV), every 5 years. Some types of HPV increase your risk of cervical cancer. Testing for HPV may also be done on women of any age with unclear Pap test results.  Other health care providers may not recommend any screening for nonpregnant women who are considered low risk for pelvic cancer and who do not have symptoms. Ask your health care provider if a screening pelvic exam is right for you.  If you have had past treatment for cervical cancer or a condition that could lead to cancer, you need Pap tests and screening for cancer for at least 20 years after your treatment. If Pap tests have been discontinued, your risk factors (such as having a new sexual partner) need to be reassessed to determine if screening should resume. Some women have medical problems that increase the chance of getting cervical cancer. In these cases, your health care provider may recommend more frequent screening and Pap tests.  Colorectal Cancer  This type of cancer can be detected and often prevented.  Routine colorectal cancer screening usually begins at 66 years of age and continues through 66 years of age.  Your health care provider may recommend screening at an earlier age if you have risk factors for colon cancer.  Your health care provider may also recommend using home test kits to check for hidden blood in the stool.  A small camera at the end of a tube can be used to examine your colon directly (sigmoidoscopy or colonoscopy). This is done to check for the earliest forms of colorectal  cancer.  Routine screening usually begins at age 33.  Direct examination of the colon should be repeated every 5-10 years through 66 years of age. However, you may need to be screened more often if early forms of precancerous polyps or small growths are found.  Skin Cancer  Check your skin from head to toe regularly.  Tell your health care provider about any new moles or changes in moles, especially if there is a change in a mole's shape or color.  Also tell your health care provider if you have a mole that is larger than the size of a pencil eraser.  Always use sunscreen. Apply sunscreen liberally and repeatedly throughout the day.  Protect yourself by wearing long sleeves, pants, a wide-brimmed hat, and sunglasses whenever you are outside.  Heart disease, diabetes, and high blood pressure  High blood pressure causes heart disease and increases the risk of stroke. High blood pressure is more likely to develop in: ? People who have blood pressure in the high end of  the normal range (130-139/85-89 mm Hg). ? People who are overweight or obese. ? People who are African American.  If you are 21-29 years of age, have your blood pressure checked every 3-5 years. If you are 3 years of age or older, have your blood pressure checked every year. You should have your blood pressure measured twice-once when you are at a hospital or clinic, and once when you are not at a hospital or clinic. Record the average of the two measurements. To check your blood pressure when you are not at a hospital or clinic, you can use: ? An automated blood pressure machine at a pharmacy. ? A home blood pressure monitor.  If you are between 17 years and 37 years old, ask your health care provider if you should take aspirin to prevent strokes.  Have regular diabetes screenings. This involves taking a blood sample to check your fasting blood sugar level. ? If you are at a normal weight and have a low risk for diabetes,  have this test once every three years after 66 years of age. ? If you are overweight and have a high risk for diabetes, consider being tested at a younger age or more often. Preventing infection Hepatitis B  If you have a higher risk for hepatitis B, you should be screened for this virus. You are considered at high risk for hepatitis B if: ? You were born in a country where hepatitis B is common. Ask your health care provider which countries are considered high risk. ? Your parents were born in a high-risk country, and you have not been immunized against hepatitis B (hepatitis B vaccine). ? You have HIV or AIDS. ? You use needles to inject street drugs. ? You live with someone who has hepatitis B. ? You have had sex with someone who has hepatitis B. ? You get hemodialysis treatment. ? You take certain medicines for conditions, including cancer, organ transplantation, and autoimmune conditions.  Hepatitis C  Blood testing is recommended for: ? Everyone born from [birth year] through 1965. ? Anyone with known risk factors for hepatitis C.  Sexually transmitted infections (STIs)  You should be screened for sexually transmitted infections (STIs) including gonorrhea and chlamydia if: ? You are sexually active and are younger than 66 years of age. ? You are older than 66 years of age and your health care provider tells you that you are at risk for this type of infection. ? Your sexual activity has changed since you were last screened and you are at an increased risk for chlamydia or gonorrhea. Ask your health care provider if you are at risk.  If you do not have HIV, but are at risk, it may be recommended that you take a prescription medicine daily to prevent HIV infection. This is called pre-exposure prophylaxis (PrEP). You are considered at risk if: ? You are sexually active and do not regularly use condoms or know the HIV status of your partner(s). ? You take drugs by injection. ? You are  sexually active with a partner who has HIV.  Talk with your health care provider about whether you are at high risk of being infected with HIV. If you choose to begin PrEP, you should first be tested for HIV. You should then be tested every 3 months for as long as you are taking PrEP. Pregnancy  If you are premenopausal and you may become pregnant, ask your health care provider about preconception counseling.  If you may become  pregnant, take 400 to 800 micrograms (mcg) of folic acid every day.  If you want to prevent pregnancy, talk to your health care provider about birth control (contraception). Osteoporosis and menopause  Osteoporosis is a disease in which the bones lose minerals and strength with aging. This can result in serious bone fractures. Your risk for osteoporosis can be identified using a bone density scan.  If you are 28 years of age or older, or if you are at risk for osteoporosis and fractures, ask your health care provider if you should be screened.  Ask your health care provider whether you should take a calcium or vitamin D supplement to lower your risk for osteoporosis.  Menopause may have certain physical symptoms and risks.  Hormone replacement therapy may reduce some of these symptoms and risks. Talk to your health care provider about whether hormone replacement therapy is right for you. Follow these instructions at home:  Schedule regular health, dental, and eye exams.  Stay current with your immunizations.  Do not use any tobacco products including cigarettes, chewing tobacco, or electronic cigarettes.  If you are pregnant, do not drink alcohol.  If you are breastfeeding, limit how much and how often you drink alcohol.  Limit alcohol intake to no more than 1 drink per day for nonpregnant women. One drink equals 12 ounces of beer, 5 ounces of wine, or 1 ounces of hard liquor.  Do not use street drugs.  Do not share needles.  Ask your health care  provider for help if you need support or information about quitting drugs.  Tell your health care provider if you often feel depressed.  Tell your health care provider if you have ever been abused or do not feel safe at home. This information is not intended to replace advice given to you by your health care provider. Make sure you discuss any questions you have with your health care provider. Document Released: 03/30/2011 Document Revised: 02/20/2016 Document Reviewed: 06/18/2015 Elsevier Interactive Patient Education  2018 [place] Maintenance for Postmenopausal Women Menopause is a normal process in which your reproductive ability comes to an end. This process happens gradually over a span of months to years, usually between the ages of 96 and 42. Menopause is complete when you have missed 12 consecutive menstrual periods. It is important to talk with your health care provider about some of the most common conditions that affect postmenopausal women, such as heart disease, cancer, and bone loss (osteoporosis). Adopting a healthy lifestyle and getting preventive care can help to promote your health and wellness. Those actions can also lower your chances of developing some of these common conditions. What should I know about menopause? During menopause, you may experience a number of symptoms, such as:  Moderate-to-severe hot flashes.  Night sweats.  Decrease in sex drive.  Mood swings.  Headaches.  Tiredness.  Irritability.  Memory problems.  Insomnia.  Choosing to treat or not to treat menopausal changes is an individual decision that you make with your health care provider. What should I know about hormone replacement therapy and supplements? Hormone therapy products are effective for treating symptoms that are associated with menopause, such as hot flashes and night sweats. Hormone replacement carries certain risks, especially as you become older. If you are  thinking about using estrogen or estrogen with progestin treatments, discuss the benefits and risks with your health care provider. What should I know about heart disease and stroke? Heart disease, heart attack, and stroke  become more likely as you age. This may be due, in part, to the hormonal changes that your body experiences during menopause. These can affect how your body processes dietary fats, triglycerides, and cholesterol. Heart attack and stroke are both medical emergencies. There are many things that you can do to help prevent heart disease and stroke:  Have your blood pressure checked at least every 1-2 years. High blood pressure causes heart disease and increases the risk of stroke.  If you are 55-79 years old, ask your health care provider if you should take aspirin to prevent a heart attack or a stroke.  Do not use any tobacco products, including cigarettes, chewing tobacco, or electronic cigarettes. If you need help quitting, ask your health care provider.  It is important to eat a healthy diet and maintain a healthy weight. ? Be sure to include plenty of vegetables, fruits, low-fat dairy products, and lean protein. ? Avoid eating foods that are high in solid fats, added sugars, or salt (sodium).  Get regular exercise. This is one of the most important things that you can do for your health. ? Try to exercise for at least 150 minutes each week. The type of exercise that you do should increase your heart rate and make you sweat. This is known as moderate-intensity exercise. ? Try to do strengthening exercises at least twice each week. Do these in addition to the moderate-intensity exercise.  Know your numbers.Ask your health care provider to check your cholesterol and your blood glucose. Continue to have your blood tested as directed by your health care provider.  What should I know about cancer screening? There are several types of cancer. Take the following steps to reduce  your risk and to catch any cancer development as early as possible. Breast Cancer  Practice breast self-awareness. ? This means understanding how your breasts normally appear and feel. ? It also means doing regular breast self-exams. Let your health care provider know about any changes, no matter how small.  If you are 40 or older, have a clinician do a breast exam (clinical breast exam or CBE) every year. Depending on your age, family history, and medical history, it may be recommended that you also have a yearly breast X-ray (mammogram).  If you have a family history of breast cancer, talk with your health care provider about genetic screening.  If you are at high risk for breast cancer, talk with your health care provider about having an MRI and a mammogram every year.  Breast cancer (BRCA) gene test is recommended for women who have family members with BRCA-related cancers. Results of the assessment will determine the need for genetic counseling and BRCA1 and for BRCA2 testing. BRCA-related cancers include these types: ? Breast. This occurs in males or females. ? Ovarian. ? Tubal. This may also be called fallopian tube cancer. ? Cancer of the abdominal or pelvic lining (peritoneal cancer). ? Prostate. ? Pancreatic.  Cervical, Uterine, and Ovarian Cancer Your health care provider may recommend that you be screened regularly for cancer of the pelvic organs. These include your ovaries, uterus, and vagina. This screening involves a pelvic exam, which includes checking for microscopic changes to the surface of your cervix (Pap test).  For women ages 21-65, health care providers may recommend a pelvic exam and a Pap test every three years. For women ages 30-65, they may recommend the Pap test and pelvic exam, combined with testing for human papilloma virus (HPV), every five years. Some types   of HPV increase your risk of cervical cancer. Testing for HPV may also be done on women of any age who  have unclear Pap test results.  Other health care providers may not recommend any screening for nonpregnant women who are considered low risk for pelvic cancer and have no symptoms. Ask your health care provider if a screening pelvic exam is right for you.  If you have had past treatment for cervical cancer or a condition that could lead to cancer, you need Pap tests and screening for cancer for at least 20 years after your treatment. If Pap tests have been discontinued for you, your risk factors (such as having a new sexual partner) need to be reassessed to determine if you should start having screenings again. Some women have medical problems that increase the chance of getting cervical cancer. In these cases, your health care provider may recommend that you have screening and Pap tests more often.  If you have a family history of uterine cancer or ovarian cancer, talk with your health care provider about genetic screening.  If you have vaginal bleeding after reaching menopause, tell your health care provider.  There are currently no reliable tests available to screen for ovarian cancer.  Lung Cancer Lung cancer screening is recommended for adults 20-34 years old who are at high risk for lung cancer because of a history of smoking. A yearly low-dose CT scan of the lungs is recommended if you:  Currently smoke.  Have a history of at least 30 pack-years of smoking and you currently smoke or have quit within the past 15 years. A pack-year is smoking an average of one pack of cigarettes per day for one year.  Yearly screening should:  Continue until it has been 15 years since you quit.  Stop if you develop a health problem that would prevent you from having lung cancer treatment.  Colorectal Cancer  This type of cancer can be detected and can often be prevented.  Routine colorectal cancer screening usually begins at age 91 and continues through age 40.  If you have risk factors for colon  cancer, your health care provider may recommend that you be screened at an earlier age.  If you have a family history of colorectal cancer, talk with your health care provider about genetic screening.  Your health care provider may also recommend using home test kits to check for hidden blood in your stool.  A small camera at the end of a tube can be used to examine your colon directly (sigmoidoscopy or colonoscopy). This is done to check for the earliest forms of colorectal cancer.  Direct examination of the colon should be repeated every 5-10 years until age 59. However, if early forms of precancerous polyps or small growths are found or if you have a family history or genetic risk for colorectal cancer, you may need to be screened more often.  Skin Cancer  Check your skin from head to toe regularly.  Monitor any moles. Be sure to tell your health care provider: ? About any new moles or changes in moles, especially if there is a change in a mole's shape or color. ? If you have a mole that is larger than the size of a pencil eraser.  If any of your family members has a history of skin cancer, especially at a young age, talk with your health care provider about genetic screening.  Always use sunscreen. Apply sunscreen liberally and repeatedly throughout the day.  Whenever you are outside, protect yourself by wearing long sleeves, pants, a wide-brimmed hat, and sunglasses.  What should I know about osteoporosis? Osteoporosis is a condition in which bone destruction happens more quickly than new bone creation. After menopause, you may be at an increased risk for osteoporosis. To help prevent osteoporosis or the bone fractures that can happen because of osteoporosis, the following is recommended:  If you are 13-49 years old, get at least 1,000 mg of calcium and at least 600 mg of vitamin D per day.  If you are older than age 83 but younger than age 38, get at least 1,200 mg of calcium and  at least 600 mg of vitamin D per day.  If you are older than age 77, get at least 1,200 mg of calcium and at least 800 mg of vitamin D per day.  Smoking and excessive alcohol intake increase the risk of osteoporosis. Eat foods that are rich in calcium and vitamin D, and do weight-bearing exercises several times each week as directed by your health care provider. What should I know about how menopause affects my mental health? Depression may occur at any age, but it is more common as you become older. Common symptoms of depression include:  Low or sad mood.  Changes in sleep patterns.  Changes in appetite or eating patterns.  Feeling an overall lack of motivation or enjoyment of activities that you previously enjoyed.  Frequent crying spells.  Talk with your health care provider if you think that you are experiencing depression. What should I know about immunizations? It is important that you get and maintain your immunizations. These include:  Tetanus, diphtheria, and pertussis (Tdap) booster vaccine.  Influenza every year before the flu season begins.  Pneumonia vaccine.  Shingles vaccine.  Your health care provider may also recommend other immunizations. This information is not intended to replace advice given to you by your health care provider. Make sure you discuss any questions you have with your health care provider. Document Released: 11/06/2005 Document Revised: 04/03/2016 Document Reviewed: 06/18/2015 Elsevier Interactive Patient Education  2018 [employer].

## 2017-06-22 NOTE — Assessment & Plan Note (Signed)
Under good control. Continue current regimen. Continue to monitor. Call with any concerns. 

## 2017-06-22 NOTE — Progress Notes (Signed)
BP 135/79 (BP Location: Left Arm, Patient Position: Sitting, Cuff Size: Normal)   Pulse 73   Temp 98.1 F (36.7 C)   Ht 5\' 3"  (1.6 m)   Wt 132 lb 2 oz (59.9 kg)   SpO2 100%   BMI 23.40 kg/m    Subjective:    Patient ID: Tonya Cabrera, female    DOB: Sep 07, 1951, 66 y.o.   MRN: [id number]  HPI: Tonya Cabrera is a 66 y.o. female presenting on 06/22/2017 for comprehensive medical examination. Current medical complaints include:  Memory issues seem to be getting worse. Has trouble filling out paper work, feels like she doesn't trust her decision process, has forgotten appointments and has taken medicine 2x. Feeling like things are getting worse. Has walked away from the stove and smoked the house.   HYPERTENSION / HYPERLIPIDEMIA Satisfied with current treatment? yes Duration of hypertension: chronic BP monitoring frequency: not checking BP range:  BP medication side effects: no Past BP meds:  Duration of hyperlipidemia: chronic Cholesterol medication side effects: no Cholesterol supplements: none Past cholesterol medications:  Medication compliance: excellent compliance Aspirin: yes Recent stressors: yes Recurrent headaches: no Visual changes: no Palpitations: no Dyspnea: no Chest pain: no Lower extremity edema: no Dizzy/lightheaded: no  She currently lives with: daughter and her husband Menopausal Symptoms: no  Depression Screen done today and results listed below:  Depression screen [facility] 2/9 06/02/2017 01/25/2017 06/08/2016  Decreased Interest 1 0 0  Down, Depressed, Hopeless 1 3 0  PHQ - 2 Score 2 3 0  Altered sleeping 3 3 -  Tired, decreased energy 0 0 -  Change in appetite 0 0 -  Feeling bad or failure about yourself  0 1 -  Trouble concentrating 1 2 -  Moving slowly or fidgety/restless 0 0 -  Suicidal thoughts 0 0 -  PHQ-9 Score 6 9 -  Difficult doing work/chores Not difficult at all - -    Past Medical History:  Past Medical History:  Diagnosis Date  .  Hypertension   . Myocardial infarction ([place])    2015  . Stroke [facility])    10-12 years ago    Surgical History:  Past Surgical History:  Procedure Laterality Date  . CAROTID STENT    . INCONTINENCE SURGERY    . THYROID SURGERY      Medications:  Current Outpatient Prescriptions on File Prior to Visit  Medication Sig  . aspirin EC 81 MG tablet Take 1 tablet (81 mg total) by mouth daily.  . cyclobenzaprine (FLEXERIL) 10 MG tablet TAKE 1 TABLET BY MOUTH AT BEDTIME  . furosemide (LASIX) 20 MG tablet Take 1 tablet (20 mg total) by mouth every other day. (Patient not taking: Reported on 06/02/2017)   No current facility-administered medications on file prior to visit.     Allergies:  No Known Allergies  Social History:  Social History   Social History  . Marital status: Divorced    Spouse name: N/A  . Number of children: N/A  . Years of education: N/A   Occupational History  . Not on file.   Social History Main Topics  . Smoking status: Former Smoker    Packs/day: 1.50    Years: 22.00    Quit date: 05/08/2014  . Smokeless tobacco: Never Used     Comment: Smoked over 25 years, quit after Heart Attack in 2015.    Marland Kitchen Alcohol use No  . Drug use: No  . Sexual activity: Not Currently  Other Topics Concern  . Not on file   Social History Narrative  . No narrative on file   History  Smoking Status  . Former Smoker  . Packs/day: 1.50  . Years: 22.00  . Quit date: 05/08/2014  Smokeless Tobacco  . Never Used    Comment: Smoked over 25 years, quit after Heart Attack in 2015.     History  Alcohol Use No    Family History:  Family History  Problem Relation Age of Onset  . Liver cancer Brother   . Heart Problems Mother   . Breast cancer Neg Hx     Past medical history, surgical history, medications, allergies, family history and social history reviewed with patient today and changes made to appropriate areas of the chart.   Review of Systems  Constitutional:  Negative.   HENT: Positive for hearing loss. Negative for congestion, ear discharge, ear pain, nosebleeds, sinus pain, sore throat and tinnitus.   Eyes: Negative.   Respiratory: Negative.  Negative for stridor.   Cardiovascular: Negative.   Gastrointestinal: Negative.   Genitourinary: Negative.   Musculoskeletal: Positive for back pain. Negative for falls, joint pain, myalgias and neck pain.  Skin: Negative.   Neurological: Negative.   Endo/Heme/Allergies: Negative for environmental allergies and polydipsia. Bruises/bleeds easily.  Psychiatric/Behavioral: Positive for depression and memory loss. Negative for hallucinations, substance abuse and suicidal ideas. The patient is not nervous/anxious and does not have insomnia.     All other ROS negative except what is listed above and in the HPI.      Objective:    BP 135/79 (BP Location: Left Arm, Patient Position: Sitting, Cuff Size: Normal)   Pulse 73   Temp 98.1 F (36.7 C)   Ht 5\' 3"  (1.6 m)   Wt 132 lb 2 oz (59.9 kg)   SpO2 100%   BMI 23.40 kg/m   Wt Readings from Last 3 Encounters:  06/22/17 132 lb 2 oz (59.9 kg)  06/02/17 133 lb 14.4 oz (60.7 kg)  03/25/17 132 lb (59.9 kg)    Physical Exam  Constitutional: She is oriented to person, place, and time. She appears well-developed and well-nourished. No distress.  HENT:  Head: Normocephalic and atraumatic.  Right Ear: Hearing and external ear normal.  Left Ear: Hearing and external ear normal.  Nose: Nose normal.  Mouth/Throat: Oropharynx is clear and moist. No oropharyngeal exudate.  Eyes: Pupils are equal, round, and reactive to light. Conjunctivae, EOM and lids are normal. Right eye exhibits no discharge. Left eye exhibits no discharge. No scleral icterus.  Neck: Normal range of motion. Neck supple. No JVD present. No tracheal deviation present. No thyromegaly present.  Cardiovascular: Normal rate, regular rhythm, normal heart sounds and intact distal pulses.  Exam reveals  no gallop and no friction rub.   No murmur heard. Pulmonary/Chest: Effort normal and breath sounds normal. No stridor. No respiratory distress. She has no wheezes. She has no rales. She exhibits no tenderness.  Abdominal: Soft. Bowel sounds are normal. She exhibits no distension and no mass. There is no tenderness. There is no rebound and no guarding.  Genitourinary:  Genitourinary Comments: Breast and pelvic exams deferred with shared decision making  Musculoskeletal: Normal range of motion. She exhibits no edema, tenderness or deformity.  Lymphadenopathy:    She has no cervical adenopathy.  Neurological: She is alert and oriented to person, place, and time. She has normal reflexes. She displays normal reflexes. No cranial nerve deficit. She exhibits normal muscle tone.  Coordination normal.  Skin: Skin is warm, dry and intact. No rash noted. She is not diaphoretic. No erythema. No pallor.  Psychiatric: She has a normal mood and affect. Her speech is normal and behavior is normal. Judgment and thought content normal. Cognition and memory are normal.  Nursing note and vitals reviewed.   Results for orders placed or performed in visit on 06/22/17  Microscopic Examination  Result Value Ref Range   WBC, UA 0-5 0 - 5 /hpf   RBC, UA 3-5 (A) 0 - 2 /hpf   Epithelial Cells (non renal) CANCELED    Bacteria, UA Moderate (A) None seen/Few   Yeast, UA Present (A) None seen  Urine Culture, Reflex  Result Value Ref Range   Urine Culture, Routine WILL FOLLOW   Microalbumin, Urine Waived  Result Value Ref Range   Microalb, Ur Waived 10 0 - 19 mg/L   Creatinine, Urine Waived 100 10 - 300 mg/dL   Microalb/Creat Ratio <30 <30 mg/g  UA/M w/rflx Culture, Routine  Result Value Ref Range   Specific Gravity, UA 1.010 1.005 - 1.030   pH, UA 5.5 5.0 - 7.5   Color, UA Yellow Yellow   Appearance Ur Cloudy (A) Clear   Leukocytes, UA 1+ (A) Negative   Protein, UA Negative Negative/Trace   Glucose, UA  Negative Negative   Ketones, UA Negative Negative   RBC, UA 1+ (A) Negative   Bilirubin, UA Negative Negative   Urobilinogen, Ur 0.2 0.2 - 1.0 mg/dL   Nitrite, UA Negative Negative   Microscopic Examination See below:    Urinalysis Reflex Comment       Assessment & Plan:   Problem List Items Addressed This Visit      Genitourinary   Benign hypertensive renal disease    Under good control. Continue current regimen. Continue to monitor. Call with any concerns.       Relevant Orders   CBC with Differential/Platelet   Comprehensive metabolic panel   Microalbumin, Urine Waived (Completed)   TSH   UA/M w/rflx Culture, Routine (Completed)     Other   Hyperlipidemia    Under good control. Continue current regimen. Continue to monitor. Call with any concerns.       Relevant Medications   metoprolol succinate (TOPROL-XL) 25 MG 24 hr tablet   lisinopril (PRINIVIL,ZESTRIL) 10 MG tablet   atorvastatin (LIPITOR) 40 MG tablet   Other Relevant Orders   Comprehensive metabolic panel   Lipid Panel w/o Chol/HDL Ratio    Other Visit Diagnoses    Routine general medical examination at a health care facility    -  Primary   Vaccines up to date. Screening labs checked today. Pap N/A, colonoscopy up to date. DEXA up to date. Mammogram ordered today. Continue diet and exercise.    Numerous moles       Would like to see dermatology- referral generated today.   Relevant Orders   Ambulatory referral to Dermatology   Memory loss       Normal 6CIT, however, it is really worrying her. Referral to neurology made today. Await their input.    Relevant Orders   Ambulatory referral to Neurology   Chronic bilateral low back pain without sciatica       Since fall a few months ago. Not improving, Will refer to PT for evaluation. Continue current regimen. Call if not improving.    Relevant Medications   naproxen (NAPROSYN) 500 MG tablet   Other Relevant Orders  Ambulatory referral to Physical Therapy    Screening for breast cancer       mammogram ordered today.   Relevant Orders   MM DIGITAL SCREENING BILATERAL       Follow up plan: Return in about 6 months (around 12/20/2017) for Follow up BP and cholesterol.   LABORATORY TESTING:  - Pap smear: not applicable  IMMUNIZATIONS:   - Tdap: Tetanus vaccination status reviewed: last tetanus booster within 10 years. - Influenza: Refused - Pneumovax: Up to date - Prevnar: Refused - Zostavax vaccine: Refused  SCREENING: -Mammogram: Ordered today  - Colonoscopy: Up to date  - Bone Density: Up to date   PATIENT COUNSELING:   Advised to take 1 mg of folate supplement per day if capable of pregnancy.   Sexuality: Discussed sexually transmitted diseases, partner selection, use of condoms, avoidance of unintended pregnancy  and contraceptive alternatives.   Advised to avoid cigarette smoking.  I discussed with the patient that most people either abstain from alcohol or drink within safe limits (<=14/week and <=4 drinks/occasion for males, <=7/weeks and <= 3 drinks/occasion for females) and that the risk for alcohol disorders and other health effects rises proportionally with the number of drinks per week and how often a drinker exceeds daily limits.  Discussed cessation/primary prevention of drug use and availability of treatment for abuse.   Diet: Encouraged to adjust caloric intake to maintain  or achieve ideal body weight, to reduce intake of dietary saturated fat and total fat, to limit sodium intake by avoiding high sodium foods and not adding table salt, and to maintain adequate dietary potassium and calcium preferably from fresh fruits, vegetables, and low-fat dairy products.    stressed the importance of regular exercise  Injury prevention: Discussed safety belts, safety helmets, smoke detector, smoking near bedding or upholstery.   Dental health: Discussed importance of regular tooth brushing, flossing, and dental visits.     NEXT PREVENTATIVE PHYSICAL DUE IN 1 YEAR. Return in about 6 months (around 12/20/2017) for Follow up BP and cholesterol.

## 2017-06-23 ENCOUNTER — Encounter: Payer: Self-pay | Admitting: Family Medicine

## 2017-06-23 LAB — CBC WITH DIFFERENTIAL/PLATELET
BASOS ABS: 0 10*3/uL (ref 0.0–0.2)
Basos: 0 %
EOS (ABSOLUTE): 0.1 10*3/uL (ref 0.0–0.4)
Eos: 2 %
HEMATOCRIT: 35.5 % (ref 34.0–46.6)
HEMOGLOBIN: 12.1 g/dL (ref 11.1–15.9)
Immature Grans (Abs): 0 10*3/uL (ref 0.0–0.1)
Immature Granulocytes: 0 %
LYMPHS ABS: 2 10*3/uL (ref 0.7–3.1)
Lymphs: 26 %
MCH: 30.9 pg (ref 26.6–33.0)
MCHC: 34.1 g/dL (ref 31.5–35.7)
MCV: 91 fL (ref 79–97)
MONOS ABS: 0.6 10*3/uL (ref 0.1–0.9)
Monocytes: 8 %
NEUTROS PCT: 64 %
Neutrophils Absolute: 4.8 10*3/uL (ref 1.4–7.0)
Platelets: 220 10*3/uL (ref 150–379)
RBC: 3.91 x10E6/uL (ref 3.77–5.28)
RDW: 13.1 % (ref 12.3–15.4)
WBC: 7.5 10*3/uL (ref 3.4–10.8)

## 2017-06-23 LAB — COMPREHENSIVE METABOLIC PANEL
ALBUMIN: 4.6 g/dL (ref 3.6–4.8)
ALK PHOS: 80 IU/L (ref 39–117)
ALT: 18 IU/L (ref 0–32)
AST: 16 IU/L (ref 0–40)
Albumin/Globulin Ratio: 2.3 — ABNORMAL HIGH (ref 1.2–2.2)
BUN/Creatinine Ratio: 17 (ref 12–28)
BUN: 15 mg/dL (ref 8–27)
Bilirubin Total: 1.2 mg/dL (ref 0.0–1.2)
CO2: 22 mmol/L (ref 20–29)
CREATININE: 0.89 mg/dL (ref 0.57–1.00)
Calcium: 9.4 mg/dL (ref 8.7–10.3)
Chloride: 108 mmol/L — ABNORMAL HIGH (ref 96–106)
GFR calc Af Amer: 78 mL/min/{1.73_m2} (ref >59)
GFR, EST NON AFRICAN AMERICAN: 68 mL/min/{1.73_m2} (ref >59)
GLUCOSE: 76 mg/dL (ref 65–99)
Globulin, Total: 2 g/dL (ref 1.5–4.5)
Potassium: 3.8 mmol/L (ref 3.5–5.2)
Sodium: 146 mmol/L — ABNORMAL HIGH (ref 134–144)
Total Protein: 6.6 g/dL (ref 6.0–8.5)

## 2017-06-23 LAB — LIPID PANEL W/O CHOL/HDL RATIO
Cholesterol, Total: 165 mg/dL (ref 100–199)
HDL: 51 mg/dL (ref >39)
LDL Calculated: 90 mg/dL (ref 0–99)
TRIGLYCERIDES: 119 mg/dL (ref 0–149)
VLDL Cholesterol Cal: 24 mg/dL (ref 5–40)

## 2017-06-23 LAB — TSH: TSH: 3.03 u[IU]/mL (ref 0.450–4.500)

## 2017-06-24 ENCOUNTER — Encounter: Payer: Self-pay | Admitting: Neurology

## 2017-06-28 ENCOUNTER — Telehealth: Payer: Self-pay | Admitting: Family Medicine

## 2017-06-28 MED ORDER — NITROFURANTOIN MONOHYD MACRO 100 MG PO CAPS: 100.0000 mg | ORAL_CAPSULE | Freq: Two times a day (BID) | ORAL | 0 refills | Status: DC

## 2017-06-28 NOTE — Telephone Encounter (Signed)
Please let her know that she has a UTI and I've sent a medicine to her pharmacy. Thanks!

## 2017-06-28 NOTE — Telephone Encounter (Signed)
Patient notified

## 2017-06-29 ENCOUNTER — Telehealth: Payer: Self-pay | Admitting: Family Medicine

## 2017-06-29 DIAGNOSIS — L821 Other seborrheic keratosis: Secondary | ICD-10-CM | POA: Diagnosis not present

## 2017-06-29 DIAGNOSIS — D229 Melanocytic nevi, unspecified: Secondary | ICD-10-CM | POA: Diagnosis not present

## 2017-06-29 DIAGNOSIS — D0461 Carcinoma in situ of skin of right upper limb, including shoulder: Secondary | ICD-10-CM | POA: Diagnosis not present

## 2017-06-29 LAB — UA/M W/RFLX CULTURE, ROUTINE
BILIRUBIN UA: NEGATIVE
Glucose, UA: NEGATIVE
KETONES UA: NEGATIVE
Nitrite, UA: NEGATIVE
PROTEIN UA: NEGATIVE
Specific Gravity, UA: 1.01 (ref 1.005–1.030)
UUROB: 0.2 mg/dL (ref 0.2–1.0)
pH, UA: 5.5 (ref 5.0–7.5)

## 2017-06-29 LAB — URINE CULTURE, REFLEX

## 2017-06-29 LAB — MICROALBUMIN, URINE WAIVED
CREATININE, URINE WAIVED: 100 mg/dL (ref 10–300)
Microalb, Ur Waived: 10 mg/L (ref 0–19)
Microalb/Creat Ratio: <30 mg/g (ref <30)

## 2017-06-29 MED ORDER — CIPROFLOXACIN HCL 500 MG PO TABS: 500.0000 mg | ORAL_TABLET | Freq: Two times a day (BID) | ORAL | 0 refills | Status: DC

## 2017-06-29 NOTE — Telephone Encounter (Signed)
Please let her know that her UTI is resistant to macrobid so I sent cipro through for her. Thanks!

## 2017-06-29 NOTE — Telephone Encounter (Signed)
Patient notified

## 2017-06-30 ENCOUNTER — Encounter: Payer: Self-pay | Admitting: Physical Therapy

## 2017-06-30 ENCOUNTER — Ambulatory Visit: Payer: Medicare Other | Attending: Family Medicine | Admitting: Physical Therapy

## 2017-06-30 DIAGNOSIS — G8929 Other chronic pain: Secondary | ICD-10-CM | POA: Diagnosis not present

## 2017-06-30 DIAGNOSIS — M545 Low back pain: Secondary | ICD-10-CM | POA: Insufficient documentation

## 2017-06-30 DIAGNOSIS — M6281 Muscle weakness (generalized): Secondary | ICD-10-CM

## 2017-06-30 DIAGNOSIS — R293 Abnormal posture: Secondary | ICD-10-CM | POA: Diagnosis not present

## 2017-06-30 NOTE — Therapy (Signed)
[facility] [address] [place], [place], [address] Phone: [phone]   Fax:  [phone]  Physical Therapy Evaluation  Patient Details  Name: COLINE CALKIN MRN: [id number] Date of Birth: [birth year] Referring Provider: Dr. Wynetta Emery  Encounter Date: 06/30/2017      PT End of Session - 06/30/17 1722    Visit Number 1   Number of Visits 8   Date for PT Re-Evaluation 07/28/17   Authorization - Visit Number 1   Authorization - Number of Visits 10   PT Start Time 1252   PT Stop Time 1406   PT Time Calculation (min) 74 min   Activity Tolerance Patient tolerated treatment well;No increased pain   Behavior During Therapy WFL for tasks assessed/performed      Past Medical History:  Diagnosis Date  . Hypertension   . Myocardial infarction ([place])    2015  . Stroke [facility])    10-12 years ago    Past Surgical History:  Procedure Laterality Date  . CAROTID STENT    . INCONTINENCE SURGERY    . THYROID SURGERY      There were no vitals filed for this visit.       Subjective Assessment - 06/30/17 1255    Subjective Pt reports pain with lifting, especially while performing work related duties    Pertinent History 66 y/o female presents with chronic recurrent LBP that has worsened since a fall in March 2018. Pt reports that she had slipped on some wet stairs. Pt reports her pain is continueing to worsen   Limitations Lifting;Standing;Walking;House hold activities   Diagnostic tests none   Currently in Pain? Yes   Pain Score 2   7/10 at worst in last week.   Pain Location Back   Pain Orientation Lower;Left;Right   Pain Descriptors / Indicators Aching   Pain Type Chronic pain   Pain Radiating Towards Does no radiate   Pain Onset More than a month ago   Pain Frequency Intermittent   Aggravating Factors  Lifting bending   Pain Relieving Factors Tiger balm, heat and menthol gels            [facility] PT Assessment - 06/30/17  0001      Assessment   Medical Diagnosis Low back pain without radiculopathy   Referring Provider Dr. Wynetta Emery   Onset Date/Surgical Date 11/26/16   Next MD Visit --  in 6 months with Park Liter, MD   Prior Therapy For ankle      Precautions   Precautions None     Restrictions   Weight Bearing Restrictions No     Balance Screen   Has the patient fallen in the past 6 months Yes   How many times? 1   Has the patient had a decrease in activity level because of a fear of falling?  No   Is the patient reluctant to leave their home because of a fear of falling?  No     Prior Function   Level of Independence Independent     PROM/AROM:  Upper Extremity: WFL with hand behind back  Lower extremity:   Lumbar AROM WFL all planes.  Neck ROM limitations with rotn. (moderate joint stiffness).   STRENGTH:  Graded on a 0-5 scale B shoulder AROM WFL.  B UE strength 4-/5 MMT.  Seated R hip flexion: 4-/5, L hip flexion: 4-/5.  B hip abd. 4/5 bilaterally.  B hip adduction 4/5 bilaterally.  R knee  ext./flexion 4+/5 MMT.  L knee ext./flexion 4+/5 MMT.  B DF 4-/5 MMT.  B EV 4-/5 MMT.  R IV 4+/5 MMT.  L IV 4-/5 MMT (ankle fracture in the 66's).      Sensory:  Light touch: Not impaired by gross assessment   Coordination:   Unimpaired by gross assessment    Observations:   Posture: Slightly rounded shoulders and forward head in sitting and standing.    BALANCE:  Withstands moderate perturbation in standing in Romberg stance. Able to maintain Romberg/sharpened Romberg >30 sec without LOB, though increased sway noted in tandem stance. Static Standing Balance  Normal Able to maintain standing balance against maximal resistance   Good Able to maintain standing balance against moderate resistance X  Good-/Fair+ Able to maintain standing balance against minimal resistance   Fair Able to stand unsupported without UE support and without LOB for 1-2 min   Fair- Requires Min A and UE support to  maintain standing without loss of balance   Poor+ Requires mod A and UE support to maintain standing without loss of balance   Poor Requires max A and UE support to maintain standing balance without loss     Standing Dynamic Balance  Normal Stand independently unsupported, able to weight shift and cross midline maximally   Good Stand independently unsupported, able to weight shift and cross midline moderately X  Good-/Fair+ Stand independently unsupported, able to weight shift across midline minimally   Fair Stand independently unsupported, weight shift, and reach ipsilaterally, loss of balance when crossing midline   Poor+ Able to stand with Min A and reach ipsilaterally, unable to weight shift   Poor+ Able to stand with Mod A and minimally reach ipsilaterally, unable to cross midline.        SPECIAL TESTS:    Supine B hamstring flexibility: L 58 deg./ R 60 deg.    No LLD.    (-) SLR.  (-) FABER with min. Tightness bilaterally.    (-) Trendelenberg    FUNCTIONAL MOBILITY:   Transfers: Pt able to transfer sit<>stand without use of UEs. LBP reproduced with sit<>supine   Gait: No gross abnormalities with gait on firm surface. Stride length and stance time equal bilaterally   OUTCOME MEASURES: TEST Outcome Interpretation  Modified Oswestry  28% Pt is 28% disabled based on LBP  Berg Balance Assessment                 56/56 indicating pt is not at an increased fall risk      Objective measurements completed on examination: See above findings.      HEP initiated with exercise progression in hooklying with instruction and verbal/tactile cueing TA activation including each x 10 reps with 3-5 sec hold:  Hooklying:  Pelvic tilts  Heel slides BLEs  Bridge BLE support  Single leg biking alternating   Cues for isolation of movement and cues to avoid breath holding. Pt able to perform all exercises wit good sustained TA activation and proper technique without increased  LBP.        PT Short Term Goals - 06/30/17 1424      PT SHORT TERM GOAL #1   Title Pt will perform HEP 3/7 days per week in order to maximize benefit from therapy to recover functional mobility without pain.   Baseline currently not exercising but working 2 jobs.     Time 2   Period Weeks   Status New   Target Date 07/14/17  PT SHORT TERM GOAL #2   Title Pt will have increased LE flexibility in hamstrings by 6 degrees in order to enable preper lumbopelvic rhythm with functional activity.    Baseline 58 deg LLE, 60 deg RLE   Time 2   Period Weeks   Status New   Target Date 07/14/17           PT Long Term Goals - 06/30/17 1437      PT LONG TERM GOAL #1   Title Pt. able to get out of [place] with no increase c/o low back pain at end of work day to improve mobility.     Baseline >7/10 LBP towards end of work   Time 4   Period Weeks   Status New   Target Date 07/28/17     PT LONG TERM GOAL #2   Title  Patient will reduce modified Oswestry score to <16% to demonstrate minimal disability with ADLs including improved sleeping tolerance, walking/sitting tolerance etc for better mobility with ADLs.    Baseline MODI: 28% disability 06/30/17   Time 4   Period Weeks   Status New   Target Date 07/28/17     PT LONG TERM GOAL #3   Title Patient independent with HEP to increase BLE gross strength to 4+/5 as to improve functional strength for independent gait, increased standing tolerance and increased ADL ability.   Baseline 4- to 4+/5   Time 4   Period Weeks   Status New   Target Date 07/28/17     PT LONG TERM GOAL #4   Title Patient will report a worst pain of 3/10 on VAS in low back to improve tolerance with ADLs and reduced symptoms with activities.    Baseline 7/10   Time 4   Period Weeks   Status New   Target Date 07/28/17            Plan - 06/30/17 1316    Clinical Impression Statement 66 y/o female presents to therapy with chronic recurrent LBP  without radicular symptoms. Her back pain has worsened since a fall in March of 2018. Pt had reproduction of her low back pain at end range with lumber AROM in all directions and mild generalized weakness in BLEs and hips with MMT, slightly worse on L side. Hip screen was negative with FABER and SI provocation testing. There is mild pain with palpation of soft tissue along spinal extensors and in glutes around PSIS. Pt had no pain with performance of HEP including TA activation with exercise progression and hamstring stretch. Modified Oswestry indicates that pt is 28% (self-perceived moderate disability).  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 Pt is appropriate for skilled therapy at this time to address her deficits in pain, lumbar ROM, and strength.   History and Personal Factors relevant to plan of care: (+) Independent in all ADLs, and with transportation (-) High physical demand of work duties; work is an aggravating factor   Clinical Presentation Stable   Clinical Presentation due to: Low fall risk without recent fall Hx, no radicular symptoms   Clinical Decision Making Moderate   Rehab Potential Good   PT Frequency 2x / week   PT Duration 4 weeks   PT Treatment/Interventions ADLs/Self Care Home Management;Electrical Stimulation;Gait training;Stair training;Functional mobility training;Therapeutic activities;Therapeutic exercise;Balance training;Neuromuscular re-education;Cognitive remediation;Patient/family education;Manual techniques;Dry needling;Passive range of motion   PT Next Visit Plan Advance HEP with general LE, hip,  and core stability exercise   PT Home Exercise Plan TA activation with exercise progression in supine   Consulted and Agree with Plan of Care Patient      Patient will benefit from skilled therapeutic intervention in order to improve the following deficits and impairments:  Decreased balance, Decreased mobility,  Hypomobility, Improper body mechanics, Decreased range of motion, Decreased activity tolerance, Decreased strength, Postural dysfunction, Pain, Impaired flexibility, Impaired perceived functional ability, Decreased endurance  Visit Diagnosis: Chronic bilateral low back pain without sciatica  Muscle weakness (generalized)  Abnormal posture      G-Codes - 07/27/17 1316    Functional Assessment Tool Used (Outpatient Only) Clinical impression/ pain/ MODI/ joint stiffness/ muscle weakness   Functional Limitation Mobility: Walking and moving around   Mobility: Walking and Moving Around Current Status ([id number]) At least 20 percent but less than 40 percent impaired, limited or restricted   Mobility: Walking and Moving Around Goal Status [phone]) At least 1 percent but less than 20 percent impaired, limited or restricted       Problem List Patient Active Problem List   Diagnosis Date Noted  . Personal history of tobacco use, presenting hazards to health 07/11/2016  . Aortic atherosclerosis ([place]) 07/09/2016  . Osteoporosis 07/07/2016  . Myopia of both eyes 06/16/2016  . Hyperlipidemia 06/08/2016  . CAD (coronary artery disease) 06/08/2016  . Snoring 06/08/2016  . Benign hypertensive renal disease 11/14/2015   Burnett Corrente, SPT Pura Spice, PT, DPT # [id number] 07-27-2017, 5:26 PM  [facility] [address] [place], [place], [address] Phone: [phone]   Fax:  [phone]  Name: CRISTLE JARED MRN: [id number] Date of Birth: November 18, 1950

## 2017-06-30 NOTE — Addendum Note (Signed)
Addended by: Pura Spice on: 06/30/2017 05:53 PM   Modules accepted: Orders

## 2017-07-01 ENCOUNTER — Telehealth: Payer: Self-pay | Admitting: Family Medicine

## 2017-07-01 NOTE — Telephone Encounter (Signed)
Patient requesting a prescription for the alternative antibiotic prescription due to being told to stop the nitrofurantoin. Patient gets prescription filled at [facility] [place].  Please Advise.  Thank you

## 2017-07-02 ENCOUNTER — Ambulatory Visit: Payer: Medicare Other | Admitting: Physical Therapy

## 2017-07-02 MED ORDER — CIPROFLOXACIN HCL 500 MG PO TABS: 500.0000 mg | ORAL_TABLET | Freq: Two times a day (BID) | ORAL | 0 refills | Status: DC

## 2017-07-02 NOTE — Telephone Encounter (Signed)
Please send patients Cipro to [facility], it was sent to the wrong pharmacy.

## 2017-07-02 NOTE — Telephone Encounter (Signed)
All set!

## 2017-07-06 ENCOUNTER — Telehealth: Payer: Self-pay | Admitting: *Deleted

## 2017-07-06 ENCOUNTER — Encounter: Payer: Self-pay | Admitting: Physical Therapy

## 2017-07-06 ENCOUNTER — Ambulatory Visit: Payer: Medicare Other | Admitting: Physical Therapy

## 2017-07-06 DIAGNOSIS — Z122 Encounter for screening for malignant neoplasm of respiratory organs: Secondary | ICD-10-CM

## 2017-07-06 DIAGNOSIS — Z87891 Personal history of nicotine dependence: Secondary | ICD-10-CM

## 2017-07-06 DIAGNOSIS — G8929 Other chronic pain: Secondary | ICD-10-CM

## 2017-07-06 DIAGNOSIS — M545 Low back pain, unspecified: Secondary | ICD-10-CM

## 2017-07-06 DIAGNOSIS — M6281 Muscle weakness (generalized): Secondary | ICD-10-CM

## 2017-07-06 DIAGNOSIS — R293 Abnormal posture: Secondary | ICD-10-CM

## 2017-07-06 NOTE — Therapy (Signed)
[facility] [address] [place], [place], [address] Phone: [phone]   Fax:  [phone]  Physical Therapy Treatment  Patient Details  Name: Tonya Cabrera MRN: [id number] Date of Birth: [birth year] Referring Provider: Dr. Wynetta Emery  Encounter Date: 07/06/2017      PT End of Session - 07/06/17 1350    Visit Number 2   Number of Visits 8   Date for PT Re-Evaluation 07/28/17   Authorization - Visit Number 2   Authorization - Number of Visits 10   PT Start Time 4196   PT Stop Time 1346   PT Time Calculation (min) 44 min   Activity Tolerance Patient tolerated treatment well;No increased pain   Behavior During Therapy WFL for tasks assessed/performed      Past Medical History:  Diagnosis Date  . Hypertension   . Myocardial infarction ([place])    2015  . Stroke [facility])    10-12 years ago    Past Surgical History:  Procedure Laterality Date  . CAROTID STENT    . INCONTINENCE SURGERY    . THYROID SURGERY      There were no vitals filed for this visit.      Subjective Assessment - 07/06/17 1257    Subjective Pt reports she has been doing her HEP, though she does no feel like she has been doing them right. She reports no pain at beginning of session   Pertinent History 66 y/o female presents with chronic recurrent LBP that has worsened since a fall in March 2018. Pt reports that she had slipped on some wet stairs. Pt reports her pain is continueing to worsen   Limitations Lifting;Standing;Walking;House hold activities   Diagnostic tests none   Currently in Pain? No/denies   Pain Onset More than a month ago   Multiple Pain Sites No       Treatment:  Ther-ex:  Pt reassessed in HEP and required re-instruction with min-mod verbal/tactile cues for correct TA activation and isolation of movement for exercises including: Supine TA activation x 5, pelvic tilts x 10 each direction, heel slides x 5 each, supine march alternating x  5 each with cues to avoid breath holding, pelvic bridge x 5, supine bike x 5; all with TA activation.  HEP advance with quadruped exercise including cat/cow x 10, and bird dog with single limb raise x 4 each limb alternating. Pt cued to use UE support through knuckles to prevent wrist pain; cues for neutral neck and lumbar spine with limb raise. Lumbar rotation with deep breathing x 4 with hold for 5 breath each direction. Pt had pain at end range rotation; cues to perform in pain-free range only.   Manual therapy:  Soft tissue mobilization to lumbar paraspinals with increased muscle tone noted throughout lumbar spinal erectors. Pt reported no pain and reported that the pressure was relieving. Muscle tightness was decreased following manual therapy.  Pt was able to perform all lumbar AROM without pain with exception of lumbar extension. Pt with no pain at end of session.          PT Education - 07/06/17 1421    Education provided Yes   Education Details Progressed HEP/ Cat-cow/ seated trunk rotn.     Person(s) Educated Patient   Methods Explanation;Demonstration;Handout   Comprehension Verbalized understanding;Returned demonstration          PT Short Term Goals - 06/30/17 1424      PT SHORT TERM GOAL #1  Title Pt will perform HEP 3/7 days per week in order to maximize benefit from therapy to recover functional mobility without pain.   Baseline currently not exercising but working 2 jobs.     Time 2   Period Weeks   Status New   Target Date 07/14/17     PT SHORT TERM GOAL #2   Title Pt will have increased LE flexibility in hamstrings by 6 degrees in order to enable preper lumbopelvic rhythm with functional activity.    Baseline 58 deg LLE, 60 deg RLE   Time 2   Period Weeks   Status New   Target Date 07/14/17           PT Long Term Goals - 06/30/17 1437      PT LONG TERM GOAL #1   Title Pt. able to get out of [place] with no increase c/o low back pain at end  of work day to improve mobility.     Baseline >7/10 LBP towards end of work   Time 4   Period Weeks   Status New   Target Date 07/28/17     PT LONG TERM GOAL #2   Title  Patient will reduce modified Oswestry score to <16% to demonstrate minimal disability with ADLs including improved sleeping tolerance, walking/sitting tolerance etc for better mobility with ADLs.    Baseline MODI: 28% disability 06/30/17   Time 4   Period Weeks   Status New   Target Date 07/28/17     PT LONG TERM GOAL #3   Title Patient independent with HEP to increase BLE gross strength to 4+/5 as to improve functional strength for independent gait, increased standing tolerance and increased ADL ability.   Baseline 4- to 4+/5   Time 4   Period Weeks   Status New   Target Date 07/28/17     PT LONG TERM GOAL #4   Title Patient will report a worst pain of 3/10 on VAS in low back to improve tolerance with ADLs and reduced symptoms with activities.    Baseline 7/10   Time 4   Period Weeks   Status New   Target Date 07/28/17               Plan - 07/06/17 1350    Clinical Impression Statement Pt had no increased in pain today with ther-ex and with manual therapy to lumbar paraspinals. Pt was able to perform her HEP correctly with min-mod verbal and tactile cueing for correct form and isolation of movement. Pt. moving around PT gym/ on and off mat tables with no increase c/o back discomfort.  Pt will benefit from continued exercise progression and stretching.    Clinical Presentation Stable   Clinical Decision Making Moderate   Rehab Potential Good   PT Frequency 2x / week   PT Duration 4 weeks   PT Treatment/Interventions ADLs/Self Care Home Management;Electrical Stimulation;Gait training;Stair training;Functional mobility training;Therapeutic activities;Therapeutic exercise;Balance training;Neuromuscular re-education;Cognitive remediation;Patient/family education;Manual techniques;Dry needling;Passive range  of motion   PT Next Visit Plan Advance HEP with core stability progression including pelvic bridge advancement; assess bird dog and consider advancement to double limb raise.   PT Home Exercise Plan TA activation with exercise progression in supine, hamstring stretch, bird dog, pelvic tilts   Consulted and Agree with Plan of Care Patient      Patient will benefit from skilled therapeutic intervention in order to improve the following deficits and impairments:  Decreased balance, Decreased mobility, Hypomobility, Improper  body mechanics, Decreased range of motion, Decreased activity tolerance, Decreased strength, Postural dysfunction, Pain, Impaired flexibility, Impaired perceived functional ability, Decreased endurance  Visit Diagnosis: Chronic bilateral low back pain without sciatica  Muscle weakness (generalized)  Abnormal posture     Problem List Patient Active Problem List   Diagnosis Date Noted  . Personal history of tobacco use, presenting hazards to health 07/11/2016  . Aortic atherosclerosis ([place]) 07/09/2016  . Osteoporosis 07/07/2016  . Myopia of both eyes 06/16/2016  . Hyperlipidemia 06/08/2016  . CAD (coronary artery disease) 06/08/2016  . Snoring 06/08/2016  . Benign hypertensive renal disease 11/14/2015    Burnett Corrente, SPT Pura Spice, PT, DPT # [id number] 07/06/2017, 2:23 PM  [place] [facility] [address] [place], [place], [address] Phone: [phone]   Fax:  [phone]  Name: Tonya Cabrera MRN: [id number] Date of Birth: May 16, 1951

## 2017-07-06 NOTE — Telephone Encounter (Signed)
Notified patient that annual lung cancer screening low dose CT scan is due currently or will be in near future. Confirmed that patient is within the age range of 55-77, and asymptomatic, (no signs or symptoms of lung cancer). Patient denies illness that would prevent curative treatment for lung cancer if found. Verified smoking history, (former, quit 2015, 33 pack year). The shared decision making visit was done 07/07/16. Patient is agreeable for CT scan being scheduled.

## 2017-07-07 ENCOUNTER — Telehealth: Payer: Self-pay | Admitting: *Deleted

## 2017-07-07 NOTE — Telephone Encounter (Signed)
[place] to reach patient. Left message on voicemail with CT appointment details.

## 2017-07-08 ENCOUNTER — Ambulatory Visit: Payer: Medicare Other | Admitting: Physical Therapy

## 2017-07-08 ENCOUNTER — Encounter: Payer: Self-pay | Admitting: Physical Therapy

## 2017-07-08 DIAGNOSIS — R293 Abnormal posture: Secondary | ICD-10-CM

## 2017-07-08 DIAGNOSIS — M545 Low back pain: Secondary | ICD-10-CM | POA: Diagnosis not present

## 2017-07-08 DIAGNOSIS — M6281 Muscle weakness (generalized): Secondary | ICD-10-CM

## 2017-07-08 DIAGNOSIS — G8929 Other chronic pain: Secondary | ICD-10-CM

## 2017-07-08 NOTE — Therapy (Signed)
[facility] [address] [place], [place], [address] Phone: [phone]   Fax:  [phone]  Physical Therapy Treatment  Patient Details  Name: Tonya Cabrera MRN: [id number] Date of Birth: March 05, 1951 Referring Provider: Dr. Wynetta Emery  Encounter Date: 07/08/2017      PT End of Session - 07/08/17 1256    Visit Number 3   Number of Visits 8   Date for PT Re-Evaluation 07/28/17   Authorization - Visit Number 3   Authorization - Number of Visits 10   PT Start Time 3474   PT Stop Time 2595   PT Time Calculation (min) 56 min   Activity Tolerance Patient tolerated treatment well;No increased pain   Behavior During Therapy WFL for tasks assessed/performed      Past Medical History:  Diagnosis Date  . Hypertension   . Myocardial infarction ([place])    2015  . Stroke [facility])    10-12 years ago    Past Surgical History:  Procedure Laterality Date  . CAROTID STENT    . INCONTINENCE SURGERY    . THYROID SURGERY      There were no vitals filed for this visit.      Subjective Assessment - 07/08/17 1259    Subjective             Pt reports no LBP currently, but does have some neck and upper back stiffness due to lifting furniture this morning. Pt continues to have increased pain and stiffness with prolonged work activities   Pertinent History 66 y/o female presents with chronic recurrent LBP that has worsened since a fall in March 2018. Pt reports that she had slipped on some wet stairs. Pt reports her pain is continueing to worsen   Limitations Lifting;Standing;Walking;House hold activities   Diagnostic tests none   Currently in Pain? Yes   Pain Score 6    Pain Location Shoulder   Pain Orientation Left;Right   Pain Descriptors / Indicators Tightness   Pain Type Chronic pain   Pain Onset More than a month ago   Pain Frequency Intermittent   Aggravating Factors  Lifting   Pain Relieving Factors rest   Effect of Pain on Daily  Activities reduced activity tolerance       Treatment:  Manual therapy: Supine LE/hip stretch including:  Hamstring, piriformis, hip flexor stretch, glut med stretch using muscle energy technique for contract-relax stretch, 5 sec contract and 20 sec relax x 3 iterations each side  Soft tissue mobilization to lumbar paraspinals, upper thoracic paraspinals, upper traps, levator scapulae, supraspinatus.    Nerve glide in SLR position with ankle DF/PF x 30 sec each LE   Cervical rotation contract-relax at end range rotation bilaterally; pt with significantly increased rotational AROM following therapy 10-15 degrees bilaterally, no pain.  Ther-ex: Re-instructed HEP in Qped including:  Cat-camel x 10 each with tactile cues for increased AROM  Bird dog single limb raise (advanced to double limb); pt able to pefrorm 10 reps alternating with fatigue   HEP advanced with Hooklying core stability progression with TA activation advanced with:  SLR alternating LE 2 x 10 each LE, re-instructed pt on posterior pelvic tilt/lumbar flattening against mat table   Bridge with march; pt cued for increased bridge height and to keep pelvis level; pt able to perform correctly with low bridge.  Bridge with LE extension alternating 3 x 5 reps each LE; pt with good form, but rapid fatigue.  PT Short Term Goals - 06/30/17 1424      PT SHORT TERM GOAL #1   Title Pt will perform HEP 3/7 days per week in order to maximize benefit from therapy to recover functional mobility without pain.   Baseline currently not exercising but working 2 jobs.     Time 2   Period Weeks   Status New   Target Date 07/14/17     PT SHORT TERM GOAL #2   Title Pt will have increased LE flexibility in hamstrings by 6 degrees in order to enable preper lumbopelvic rhythm with functional activity.    Baseline 58 deg LLE, 60 deg RLE   Time 2   Period Weeks   Status New   Target Date 07/14/17           PT Long Term  Goals - 06/30/17 1437      PT LONG TERM GOAL #1   Title Pt. able to get out of [place] with no increase c/o low back pain at end of work day to improve mobility.     Baseline >7/10 LBP towards end of work   Time 4   Period Weeks   Status New   Target Date 07/28/17     PT LONG TERM GOAL #2   Title  Patient will reduce modified Oswestry score to <16% to demonstrate minimal disability with ADLs including improved sleeping tolerance, walking/sitting tolerance etc for better mobility with ADLs.    Baseline MODI: 28% disability 06/30/17   Time 4   Period Weeks   Status New   Target Date 07/28/17     PT LONG TERM GOAL #3   Title Patient independent with HEP to increase BLE gross strength to 4+/5 as to improve functional strength for independent gait, increased standing tolerance and increased ADL ability.   Baseline 4- to 4+/5   Time 4   Period Weeks   Status New   Target Date 07/28/17     PT LONG TERM GOAL #4   Title Patient will report a worst pain of 3/10 on VAS in low back to improve tolerance with ADLs and reduced symptoms with activities.    Baseline 7/10   Time 4   Period Weeks   Status New   Target Date 07/28/17            Plan - 07/08/17 1355    Clinical Impression Statement Pt continues to have increased pain at the end of her work nights, though she had no LBP during session today. She was able to perform advanced core stability exercise including bridges with no pain, which she was unable to do on her first therapy session. Pt had shoulder and neck pain today due to moving furniture this morning, which resolved completely with soft tissue mobilization and contract-relax cervical rotation stretch. Pt continues to have muscle tightness in lumbar and thoracic paraspinals and in upper traps and shoulders   Clinical Presentation Stable   Clinical Decision Making Moderate   Rehab Potential Good   PT Frequency 2x / week   PT Duration 4 weeks   PT  Treatment/Interventions ADLs/Self Care Home Management;Electrical Stimulation;Gait training;Stair training;Functional mobility training;Therapeutic activities;Therapeutic exercise;Balance training;Neuromuscular re-education;Cognitive remediation;Patient/family education;Manual techniques;Dry needling;Passive range of motion   PT Next Visit Plan re-assess new HEP (bridge with leg ext, march, bird dog with double limb raise; manual therapy to lumbar, re-assess shoulder/neck pain.   PT Home Exercise Plan Advance HEP with core stability progression including pelvic bridge with  LAQ and marching.    Consulted and Agree with Plan of Care Patient      Patient will benefit from skilled therapeutic intervention in order to improve the following deficits and impairments:  Decreased balance, Decreased mobility, Hypomobility, Improper body mechanics, Decreased range of motion, Decreased activity tolerance, Decreased strength, Postural dysfunction, Pain, Impaired flexibility, Impaired perceived functional ability, Decreased endurance  Visit Diagnosis: Chronic bilateral low back pain without sciatica  Muscle weakness (generalized)  Abnormal posture     Problem List Patient Active Problem List   Diagnosis Date Noted  . Personal history of tobacco use, presenting hazards to health 07/11/2016  . Aortic atherosclerosis ([place]) 07/09/2016  . Osteoporosis 07/07/2016  . Myopia of both eyes 06/16/2016  . Hyperlipidemia 06/08/2016  . CAD (coronary artery disease) 06/08/2016  . Snoring 06/08/2016  . Benign hypertensive renal disease 11/14/2015   Burnett Corrente, SPT Pura Spice, PT, DPT # [id number] 07/08/2017, 3:28 PM  [facility] [address] [place], [place], [address] Phone: [phone]   Fax:  [phone]  Name: Tonya Cabrera MRN: [id number] Date of Birth: 25-May-1951

## 2017-07-09 ENCOUNTER — Ambulatory Visit (INDEPENDENT_AMBULATORY_CARE_PROVIDER_SITE_OTHER): Payer: Medicare Other | Admitting: Internal Medicine

## 2017-07-09 ENCOUNTER — Encounter: Payer: Self-pay | Admitting: Internal Medicine

## 2017-07-09 VITALS — BP 112/78 | HR 61 | Resp 16 | Ht 63.0 in | Wt 135.0 lb

## 2017-07-09 DIAGNOSIS — F99 Mental disorder, not otherwise specified: Secondary | ICD-10-CM

## 2017-07-09 DIAGNOSIS — F411 Generalized anxiety disorder: Secondary | ICD-10-CM | POA: Diagnosis not present

## 2017-07-09 DIAGNOSIS — F5105 Insomnia due to other mental disorder: Secondary | ICD-10-CM

## 2017-07-09 DIAGNOSIS — G4719 Other hypersomnia: Secondary | ICD-10-CM | POA: Diagnosis not present

## 2017-07-09 MED ORDER — ZOLPIDEM TARTRATE 5 MG PO TABS: 5.0000 mg | ORAL_TABLET | Freq: Every evening | ORAL | 5 refills | Status: DC | PRN

## 2017-07-09 NOTE — Progress Notes (Signed)
[facility]       Assessment and Plan:  Excessive daytime sleepiness.  --Suspect that this is secondary to insomnia and may be contributed to by naprosyn.   Insomnia.  --Sleep initiation and sleep maintenance type, restart ambien 5 mg qhs.   Anxiety. --Continues to contribute to insomnia, she has declined referral to behavioral health.   Snoring.  --Continue to snore, sleep study negative.    Date: 07/09/2017  MRN# [id number] Tonya Cabrera 1950-09-29    Tonya Cabrera is a 66 y.o. old female seen in consultation for chief complaint of:    Chief Complaint  Patient presents with  . Insomnia    Pt still not sleep well. She says hard to get to sleep and stay asleep.    HPI:   The patient is a 66 yo female with a history of CAD, HTN. At last visit she was noted to have symptoms of excessive daytime sleepiness, she was referred for a sleep study which was negative for OSA. She was also noted to have sleep maintenance insomnia, and was started on ambien 5 mg qhs.  She ran out and never restarted. She does not use any sleep trackers.  She goes to bed at 10, she tinks she falls asleep with ambien in an hour. With nothing she will take 2-3 hours to fall asleep.   She continues to have significant anxiety.   She smoked about 1 ppd until her heart attack in 2015. She is undergoing CT lung cancer screening.   Sleep study 11/04/16 on: AHI equals 3. Very poor sleep efficiency consistent with insomnia.  Medication:   Reviewed.  Includes Flexeril, metoprolol   Allergies:  Patient has no known allergies.  Review of Systems: Gen:  Denies  fever, sweats, chills HEENT: Denies blurred vision, double vision. bleeds, sore throat Cvc:  No dizziness, chest pain. Resp:   Denies cough or sputum production, shortness of breath Gi: Denies swallowing difficulty, stomach pain. Gu:  Denies bladder incontinence, burning urine Ext:   No Joint pain, stiffness. Skin: No skin  rash,  hives  Endoc:  No polyuria, polydipsia. Psych: No depression, insomnia. Other:  All other systems were reviewed with the patient and were negative other that what is mentioned in the HPI.   Physical Examination:   VS: BP 112/78 (BP Location: Left Arm, Cuff Size: Normal)   Pulse 61   Resp 16   Ht 5\' 3"  (1.6 m)   Wt 135 lb (61.2 kg)   SpO2 99%   BMI 23.91 kg/m   General Appearance: No distress  Neuro:without focal findings,  speech normal,  HEENT: PERRLA, EOM intact.   Pulmonary: normal breath sounds, No wheezing.  CardiovascularNormal S1,S2.  No m/r/g.   Abdomen: Benign, Soft, non-tender. Renal:  No costovertebral tenderness  GU:  No performed at this time. Endoc: No evident thyromegaly, no signs of acromegaly. Skin:   warm, no rashes, no ecchymosis  Extremities: normal, no cyanosis, clubbing.  Other findings:    LABORATORY PANEL:   CBC No results for input(s): WBC, HGB, HCT, PLT in the last 168 hours. ------------------------------------------------------------------------------------------------------------------  Chemistries  No results for input(s): NA, K, CL, CO2, GLUCOSE, BUN, CREATININE, CALCIUM, MG, AST, ALT, ALKPHOS, BILITOT in the last 168 hours.  Invalid input(s): GFRCGP ------------------------------------------------------------------------------------------------------------------  Cardiac Enzymes No results for input(s): TROPONINI in the last 168 hours. ------------------------------------------------------------  RADIOLOGY:  No results found.     Thank  you for the consultation and for allowing  [facility] to assist in the care of your patient. Our recommendations are noted above.  Please contact us if we can be of further service.   [name], MD.  Board Certified in Internal Medicine, Pulmonary Medicine, [place], and Sleep Medicine.  [facility] Office Number: [phone]  [name], M.D.  [name], M.D  07/09/2017

## 2017-07-09 NOTE — Patient Instructions (Signed)
--  Restart ambien 5 mg qhs.

## 2017-07-13 ENCOUNTER — Ambulatory Visit: Payer: Medicare Other

## 2017-07-13 DIAGNOSIS — R293 Abnormal posture: Secondary | ICD-10-CM | POA: Diagnosis not present

## 2017-07-13 DIAGNOSIS — G8929 Other chronic pain: Secondary | ICD-10-CM

## 2017-07-13 DIAGNOSIS — M6281 Muscle weakness (generalized): Secondary | ICD-10-CM | POA: Diagnosis not present

## 2017-07-13 DIAGNOSIS — M545 Low back pain, unspecified: Secondary | ICD-10-CM

## 2017-07-13 DIAGNOSIS — D0461 Carcinoma in situ of skin of right upper limb, including shoulder: Secondary | ICD-10-CM | POA: Diagnosis not present

## 2017-07-13 NOTE — Therapy (Signed)
[facility] [address] [place], [place], [address] Phone: [phone]   Fax:  [phone]  Physical Therapy Treatment  Patient Details  Name: Tonya Cabrera MRN: [id number] Date of Birth: [birth year] Referring Provider: Dr. Wynetta Emery  Encounter Date: 07/13/2017      PT End of Session - 07/13/17 1303    Visit Number 4   Number of Visits 8   Date for PT Re-Evaluation 07/28/17   Authorization - Visit Number 4   Authorization - Number of Visits 10   PT Start Time 1300   PT Stop Time 5465   PT Time Calculation (min) 45 min   Activity Tolerance Patient tolerated treatment well;No increased pain   Behavior During Therapy WFL for tasks assessed/performed      Past Medical History:  Diagnosis Date  . Hypertension   . Myocardial infarction ([place])    2015  . Stroke [facility])    10-12 years ago    Past Surgical History:  Procedure Laterality Date  . CAROTID STENT    . INCONTINENCE SURGERY    . THYROID SURGERY      There were no vitals filed for this visit.      Subjective Assessment - 07/13/17 1301    Subjective Pt states that she did some heavy yardwork yesterday and has had some increased back soreness. She is performing her stretches intermittently and believes that they are helping with her back pain. No specific questions at this time.    Pertinent History 66 y/o female presents with chronic recurrent LBP that has worsened since a fall in March 2018. Pt reports that she had slipped on some wet stairs. Pt reports her pain is continueing to worsen   Limitations Lifting;Standing;Walking;House hold activities   Diagnostic tests none   Currently in Pain? Yes   Pain Score 4    Pain Location Back   Pain Orientation Lower   Pain Descriptors / Indicators Tightness   Pain Type Chronic pain   Pain Onset More than a month ago   Pain Frequency Intermittent          Treatment:   Manual therapy:  Supine LE/hip stretch  including:  Hamstring, hip IR/ER, single knee to chest, using muscle energy technique for contract-relax stretch of hamstrings, 3 sec contract and 20 sec relax x multiple bouts on each side; Nerve glide in SLR position with ankle DF/PF x 30 sec each LE; STM to lumbar paraspinals, no trigger points noted and no pain reported. Increase in muscle tone noted;   Ther-ex: SciFit L5 x 5 minutes for warm-up during history; SLR alternating LE x 10 each LE, re-instructed pt on posterior pelvic tilt/lumbar, flattening against mat table with intermittent tactile cues;  Hooklying pball presses into knees with isometric abdominal contraction 5s hold x 10; Hooklying pallof press with red tband x 10 each direction; Bridge with march; pt cued for increased bridge height and to keep pelvis level, x 10 bilateral; Bridge with LE extension alternating 3 x 5 reps each LE; pt with good form, but rapid fatigue.   Re-instructed HEP in Qped including:  Cat-camel x 10 each with tactile cues for increased AROM, cues for coordination with breathing; Bird dog single progressing to double limb raise x 10 each, verbal and tactile cues to avoid excessive lateral weight shifting;  PT Education - 07/13/17 1302    Education provided Yes   Education Details Exercise form/technique   Person(s) Educated Patient   Methods Explanation   Comprehension Verbalized understanding          PT Short Term Goals - 06/30/17 1424      PT SHORT TERM GOAL #1   Title Pt will perform HEP 3/7 days per week in order to maximize benefit from therapy to recover functional mobility without pain.   Baseline currently not exercising but working 2 jobs.     Time 2   Period Weeks   Status New   Target Date 07/14/17     PT SHORT TERM GOAL #2   Title Pt will have increased LE flexibility in hamstrings by 6 degrees in order to enable preper lumbopelvic rhythm with functional activity.     Baseline 58 deg LLE, 60 deg RLE   Time 2   Period Weeks   Status New   Target Date 07/14/17           PT Long Term Goals - 06/30/17 1437      PT LONG TERM GOAL #1   Title Pt. able to get out of [place] with no increase c/o low back pain at end of work day to improve mobility.     Baseline >7/10 LBP towards end of work   Time 4   Period Weeks   Status New   Target Date 07/28/17     PT LONG TERM GOAL #2   Title  Patient will reduce modified Oswestry score to <16% to demonstrate minimal disability with ADLs including improved sleeping tolerance, walking/sitting tolerance etc for better mobility with ADLs.    Baseline MODI: 28% disability 06/30/17   Time 4   Period Weeks   Status New   Target Date 07/28/17     PT LONG TERM GOAL #3   Title Patient independent with HEP to increase BLE gross strength to 4+/5 as to improve functional strength for independent gait, increased standing tolerance and increased ADL ability.   Baseline 4- to 4+/5   Time 4   Period Weeks   Status New   Target Date 07/28/17     PT LONG TERM GOAL #4   Title Patient will report a worst pain of 3/10 on VAS in low back to improve tolerance with ADLs and reduced symptoms with activities.    Baseline 7/10   Time 4   Period Weeks   Status New   Target Date 07/28/17               Plan - 07/13/17 1303    Clinical Impression Statement Pt reports decrease in low back pain following therapy session to 2/10. She demonstrates improved ability to perform posterior pelvic tilts today with cues. No trigger points or lumbar pain with STM today. Pt encouraged to continue HEP. No progression provided at this time. Follow-up as scheduled.    Clinical Presentation Stable   Clinical Decision Making Moderate   Rehab Potential Good   PT Frequency 2x / week   PT Duration 4 weeks   PT Treatment/Interventions ADLs/Self Care Home Management;Electrical Stimulation;Gait training;Stair training;Functional mobility  training;Therapeutic activities;Therapeutic exercise;Balance training;Neuromuscular re-education;Cognitive remediation;Patient/family education;Manual techniques;Dry needling;Passive range of motion   PT Next Visit Plan re-assess HEP (bridge with leg ext, march, bird dog with double limb raise); manual therapy to lumbar, re-assess shoulder/neck pain.   PT Home Exercise Plan Advance HEP with core stability progression including pelvic  bridge with LAQ and marching.    Consulted and Agree with Plan of Care Patient      Patient will benefit from skilled therapeutic intervention in order to improve the following deficits and impairments:  Decreased balance, Decreased mobility, Hypomobility, Improper body mechanics, Decreased range of motion, Decreased activity tolerance, Decreased strength, Postural dysfunction, Pain, Impaired flexibility, Impaired perceived functional ability, Decreased endurance  Visit Diagnosis: Chronic bilateral low back pain without sciatica  Muscle weakness (generalized)     Problem List Patient Active Problem List   Diagnosis Date Noted  . Personal history of tobacco use, presenting hazards to health 07/11/2016  . Aortic atherosclerosis ([place]) 07/09/2016  . Osteoporosis 07/07/2016  . Myopia of both eyes 06/16/2016  . Hyperlipidemia 06/08/2016  . CAD (coronary artery disease) 06/08/2016  . Snoring 06/08/2016  . Benign hypertensive renal disease 11/14/2015   Phillips Grout PT, DPT   , 07/13/2017, 3:19 PM   [facility] [address] [place], [place], [address] Phone: [phone]   Fax:  [phone]  Name: CHERRI YERA MRN: [id number] Date of Birth: [birth year]

## 2017-07-15 ENCOUNTER — Ambulatory Visit: Payer: Medicare Other

## 2017-07-15 DIAGNOSIS — G8929 Other chronic pain: Secondary | ICD-10-CM | POA: Diagnosis not present

## 2017-07-15 DIAGNOSIS — M6281 Muscle weakness (generalized): Secondary | ICD-10-CM

## 2017-07-15 DIAGNOSIS — M545 Low back pain: Secondary | ICD-10-CM | POA: Diagnosis not present

## 2017-07-15 DIAGNOSIS — R293 Abnormal posture: Secondary | ICD-10-CM | POA: Diagnosis not present

## 2017-07-15 NOTE — Therapy (Signed)
[facility] [address] [place], [place], [address] Phone: [phone]   Fax:  [phone]  Physical Therapy Treatment  Patient Details  Name: Tonya Cabrera MRN: [id number] Date of Birth: [birth year] Referring Provider: Dr. Wynetta Emery  Encounter Date: 07/15/2017      PT End of Session - 07/15/17 1301    Visit Number 5   Number of Visits 8   Date for PT Re-Evaluation 07/28/17   Authorization - Visit Number 5   Authorization - Number of Visits 10   PT Start Time 1300   PT Stop Time 0814   PT Time Calculation (min) 45 min   Activity Tolerance Patient tolerated treatment well;No increased pain   Behavior During Therapy WFL for tasks assessed/performed      Past Medical History:  Diagnosis Date  . Hypertension   . Myocardial infarction ([place])    2015  . Stroke [facility])    10-12 years ago    Past Surgical History:  Procedure Laterality Date  . CAROTID STENT    . INCONTINENCE SURGERY    . THYROID SURGERY      There were no vitals filed for this visit.      Subjective Assessment - 07/15/17 1300    Subjective Pt reports that she worked a 12 hour shift yesterday and her back is a little more painful today. She is currently reporting 4/10 low back pain. No specific questions or concerns at this time.    Pertinent History 66 y/o female presents with chronic recurrent LBP that has worsened since a fall in March 2018. Pt reports that she had slipped on some wet stairs. Pt reports her pain is continueing to worsen   Limitations Lifting;Standing;Walking;House hold activities   Diagnostic tests none   Currently in Pain? Yes   Pain Score 4    Pain Location Back   Pain Orientation Lower   Pain Descriptors / Indicators Tightness   Pain Type Chronic pain   Pain Onset More than a month ago   Pain Frequency Intermittent          Treatment:   Manual therapy:  Supine LE/hip stretch including hamstring and hip IR/ER x multiple  bouts on each side 30s holds, attempted single knee to chest however no lumbar stretch reported on this date; Nerve glide in SLR position with ankle DF/PF x 30 sec each LE;  Ther-ex: SciFit L5 x 6 minutes for warm-up during history (3 minutes unbilled); SLR alternating LE with posterior pelvic tilt (good form today without cues) x 10 each LE;  Hooklying bicycles with unilateral LE and posterior tilt x 30s each; Hooklying pball presses into knees with isometric abdominal contraction 5s hold x 10; Bridge with knee extension, pt cued for increased bridge height and to keep pelvis level, x 10 bilateral; Pball bridges 2 x 10 bilateral, arms at side; Goblet squats with 8# med ball, extensive cues for proper technique with squat Standing pallof press with nautilus machine 20# feet apart x 10 bilateral (easy), 30# feet together (difficult) x 10 bilateral; Nautilus resisted walking 60# forward/backwards/sidestepping x 3 each direction;  Pt provided intermittent cues for proper form/technique during exercises.                         PT Education - 07/15/17 1301    Education provided Yes   Education Details excercise form/technique   Person(s) Educated Patient   Methods Explanation   Comprehension  Verbalized understanding          PT Short Term Goals - 06/30/17 1424      PT SHORT TERM GOAL #1   Title Pt will perform HEP 3/7 days per week in order to maximize benefit from therapy to recover functional mobility without pain.   Baseline currently not exercising but working 2 jobs.     Time 2   Period Weeks   Status New   Target Date 07/14/17     PT SHORT TERM GOAL #2   Title Pt will have increased LE flexibility in hamstrings by 6 degrees in order to enable preper lumbopelvic rhythm with functional activity.    Baseline 58 deg LLE, 60 deg RLE   Time 2   Period Weeks   Status New   Target Date 07/14/17           PT Long Term Goals - 06/30/17 1437      PT  LONG TERM GOAL #1   Title Pt. able to get out of [place] with no increase c/o low back pain at end of work day to improve mobility.     Baseline >7/10 LBP towards end of work   Time 4   Period Weeks   Status New   Target Date 07/28/17     PT LONG TERM GOAL #2   Title  Patient will reduce modified Oswestry score to <16% to demonstrate minimal disability with ADLs including improved sleeping tolerance, walking/sitting tolerance etc for better mobility with ADLs.    Baseline MODI: 28% disability 06/30/17   Time 4   Period Weeks   Status New   Target Date 07/28/17     PT LONG TERM GOAL #3   Title Patient independent with HEP to increase BLE gross strength to 4+/5 as to improve functional strength for independent gait, increased standing tolerance and increased ADL ability.   Baseline 4- to 4+/5   Time 4   Period Weeks   Status New   Target Date 07/28/17     PT LONG TERM GOAL #4   Title Patient will report a worst pain of 3/10 on VAS in low back to improve tolerance with ADLs and reduced symptoms with activities.    Baseline 7/10   Time 4   Period Weeks   Status New   Target Date 07/28/17               Plan - 07/15/17 1301    Clinical Impression Statement Pt does very well with therapy today and is able to progress the challenge of some of her core strengthening exercises today. She demonstrates good pelvic control with pball bridges. Pt reports significant improvement in her low back pain following session but does not rate on NPRS. Pt encouraged to continue HEP and follow-up as scheduled.    Rehab Potential Good   PT Frequency 2x / week   PT Duration 4 weeks   PT Treatment/Interventions ADLs/Self Care Home Management;Electrical Stimulation;Gait training;Stair training;Functional mobility training;Therapeutic activities;Therapeutic exercise;Balance training;Neuromuscular re-education;Cognitive remediation;Patient/family education;Manual techniques;Dry needling;Passive  range of motion   PT Next Visit Plan re-assess HEP (bridge with leg ext, march, bird dog with double limb raise); manual therapy to lumbar, re-assess shoulder/neck pain.   PT Home Exercise Plan Advance HEP with core stability progression including pelvic bridge with LAQ and marching.    Consulted and Agree with Plan of Care Patient      Patient will benefit from skilled therapeutic intervention in order to  improve the following deficits and impairments:  Decreased balance, Decreased mobility, Hypomobility, Improper body mechanics, Decreased range of motion, Decreased activity tolerance, Decreased strength, Postural dysfunction, Pain, Impaired flexibility, Impaired perceived functional ability, Decreased endurance  Visit Diagnosis: Chronic bilateral low back pain without sciatica  Muscle weakness (generalized)     Problem List Patient Active Problem List   Diagnosis Date Noted  . Personal history of tobacco use, presenting hazards to health 07/11/2016  . Aortic atherosclerosis ([place]) 07/09/2016  . Osteoporosis 07/07/2016  . Myopia of both eyes 06/16/2016  . Hyperlipidemia 06/08/2016  . CAD (coronary artery disease) 06/08/2016  . Snoring 06/08/2016  . Benign hypertensive renal disease 11/14/2015   Phillips Grout PT, DPT   Issaih Kaus 07/15/2017, 2:09 PM  [facility] [address] [place], [place], [address] Phone: [phone]   Fax:  [phone]  Name: Tonya Cabrera MRN: [id number] Date of Birth: 1951-04-27

## 2017-07-16 ENCOUNTER — Ambulatory Visit
Admission: RE | Admit: 2017-07-16 | Discharge: 2017-07-16 | Disposition: A | Discharge status: Home / Self Care | Payer: Medicare Other | Source: Ambulatory Visit | Attending: Oncology | Admitting: Oncology

## 2017-07-16 DIAGNOSIS — Z87891 Personal history of nicotine dependence: Secondary | ICD-10-CM | POA: Diagnosis not present

## 2017-07-16 DIAGNOSIS — N200 Calculus of kidney: Secondary | ICD-10-CM | POA: Insufficient documentation

## 2017-07-16 DIAGNOSIS — N281 Cyst of kidney, acquired: Secondary | ICD-10-CM | POA: Insufficient documentation

## 2017-07-16 DIAGNOSIS — I700 Atherosclerosis of aorta: Secondary | ICD-10-CM | POA: Diagnosis not present

## 2017-07-16 DIAGNOSIS — J432 Centrilobular emphysema: Secondary | ICD-10-CM | POA: Diagnosis not present

## 2017-07-16 DIAGNOSIS — I2510 Atherosclerotic heart disease of native coronary artery without angina pectoris: Secondary | ICD-10-CM | POA: Diagnosis not present

## 2017-07-16 DIAGNOSIS — Z122 Encounter for screening for malignant neoplasm of respiratory organs: Secondary | ICD-10-CM | POA: Diagnosis not present

## 2017-07-16 DIAGNOSIS — M47814 Spondylosis without myelopathy or radiculopathy, thoracic region: Secondary | ICD-10-CM | POA: Insufficient documentation

## 2017-07-19 ENCOUNTER — Encounter: Payer: Self-pay | Admitting: *Deleted

## 2017-07-22 ENCOUNTER — Ambulatory Visit: Payer: Medicare Other | Admitting: Physical Therapy

## 2017-07-28 ENCOUNTER — Encounter: Payer: Self-pay | Admitting: Physical Therapy

## 2017-07-28 ENCOUNTER — Ambulatory Visit: Payer: Medicare Other | Admitting: Physical Therapy

## 2017-07-28 DIAGNOSIS — G8929 Other chronic pain: Secondary | ICD-10-CM | POA: Diagnosis not present

## 2017-07-28 DIAGNOSIS — M545 Low back pain: Secondary | ICD-10-CM | POA: Diagnosis not present

## 2017-07-28 DIAGNOSIS — M6281 Muscle weakness (generalized): Secondary | ICD-10-CM

## 2017-07-28 DIAGNOSIS — R293 Abnormal posture: Secondary | ICD-10-CM | POA: Diagnosis not present

## 2017-07-28 NOTE — Therapy (Signed)
[facility] [address] [place], [place], [address] Phone: [phone]   Fax:  [phone]  Physical Therapy Treatment/ Progress Note  Patient Details  Name: Tonya Cabrera MRN: [id number] Date of Birth: [birth year] Referring Provider: Dr. Wynetta Emery  Encounter Date: 07/28/2017      PT End of Session - 07/28/17 1659    Visit Number 6   Number of Visits 12   Date for PT Re-Evaluation 08/25/17   Authorization - Visit Number 6   Authorization - Number of Visits 10   PT Start Time 0388   PT Stop Time 1344   PT Time Calculation (min) 46 min   Activity Tolerance Patient tolerated treatment well;No increased pain   Behavior During Therapy WFL for tasks assessed/performed      Past Medical History:  Diagnosis Date  . Hypertension   . Myocardial infarction ([place])    2015  . Stroke [facility])    10-12 years ago    Past Surgical History:  Procedure Laterality Date  . CAROTID STENT    . INCONTINENCE SURGERY    . THYROID SURGERY      There were no vitals filed for this visit.      Subjective Assessment - 07/28/17 1258    Subjective Pt reports that she continues to have LBP at end of work shift, though she feels that she is having increased standing tolerance.    Pertinent History 65 y/o female presents with chronic recurrent LBP that has worsened since a fall in March 2018. Pt reports that she had slipped on some wet stairs. Pt reports her pain is continueing to worsen   Limitations Lifting;Standing;Walking;House hold activities   Diagnostic tests none   Currently in Pain? No/denies   Pain Onset More than a month ago       Treatment:   Goals re-assessed:  Hamstring flexibility: RLE 70 deg, LLE 68 deg. 06/30/17: 58 deg LLE, 60 deg RLE  MODI: No change since last assessment 06/30/17: 28% disability  Worst pain: 07/28/17 6-7/10;   06/30/17: 7/10  Strength:    07/28/17 hip flexion: 4+/5,  hip abd. 4+/5,  hip adduction 5/5,   knee ext./flexion 5/5,  DF 5/5,  EV 5/5,  R IV 4+/5,  L IV 4/5    06/30/17 hip flexion: 4-/5,  hip abd. 4/5,  hip adduction 4/5,  knee ext./flexion 4+/5,  DF 4-/5,  EV 4-/5,  R IV 4+/5,  L IV 4-/5 Ther-ex: Sidelying:  Plank 2 x 15 sec each side from knees with BE support  Prone plank from toes 2 x 15 sec Supine:  Cervical retraction 2 x 10 Standing:  Low row with GTB 2 x 20  HEP advanced with cervical retraction, and prone plank/side plank        PT Education - 07/28/17 1659    Education provided Yes   Education Details Plan of care, HEP updated   Person(s) Educated Patient   Methods Explanation;Demonstration;Tactile cues;Verbal cues;Handout   Comprehension Tactile cues required;Verbal cues required;Returned demonstration;Verbalized understanding          PT Short Term Goals - 07/28/17 1703      PT SHORT TERM GOAL #1   Title Pt will perform HEP 3/7 days per week in order to maximize benefit from therapy to recover functional mobility without pain.   Baseline Pt exercises almost every day, though she only perfoms part of HEP   Time 2   Period Weeks   Status  Partially Met   Target Date 08/11/17     PT SHORT TERM GOAL #2   Title Pt will have increased LE flexibility in hamstrings by 6 degrees in order to enable preper lumbopelvic rhythm with functional activity.    Baseline 58 deg LLE, 60 deg RLE   10/31 RLE 70 deg, LLE 68 deg   Time 2   Period Weeks   Status Achieved           PT Long Term Goals - 07/28/17 1705      PT LONG TERM GOAL #1   Title Pt. able to get out of [place] with no increase c/o low back pain at end of work day to improve mobility.     Baseline pt still having LBP with getting in/out of truck   Time 4   Period Weeks   Status Partially Met   Target Date 08/25/17     PT LONG TERM GOAL #2   Title  Patient will reduce modified Oswestry score to <16% to demonstrate minimal disability with ADLs including improved sleeping  tolerance, walking/sitting tolerance etc for Cabrera mobility with ADLs.    Baseline MODI: 28% disability 06/30/17      28% 10/31   Time 4   Period Weeks   Status Not Met   Target Date 08/25/17     PT LONG TERM GOAL #3   Title Patient independent with HEP to increase BLE gross strength to 4+/5 as to improve functional strength for independent gait, increased standing tolerance and increased ADL ability.   Baseline all 4+/5 except L IV   Time 4   Period Weeks   Status Achieved     PT LONG TERM GOAL #4   Title Patient will report a worst pain of 3/10 on VAS in low back to improve tolerance with ADLs and reduced symptoms with activities.    Baseline 6-7/10   Time 4   Period Weeks   Status Not Met   Target Date 08/25/17               Plan - 07/28/17 1324    Clinical Impression Statement Pt is showing moderate improvement in LBP. No change in MODI (28% disability). Strength has improved in all muscle groups tested in BLEs. Pt is doing well with her HEP. 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 Pt has been stretching at work on her breaks and reports that this helps tremendously. Pt will benefit from continued therapy with reduced frequency to 1/wk with continued advancement of her HEP.    Clinical Presentation Stable   Clinical Decision Making Moderate   Rehab Potential Good   PT Frequency 1x / week   PT Duration 4 weeks   PT Treatment/Interventions ADLs/Self Care Home Management;Electrical Stimulation;Gait training;Stair training;Functional mobility training;Therapeutic activities;Therapeutic exercise;Balance training;Neuromuscular re-education;Cognitive remediation;Patient/family education;Manual techniques;Dry needling;Passive range of motion   PT Next Visit Plan re-assess HEP (cervical retraction, and prone plank/side plank, bridge with leg ext, march, bird dog with double limb raise);  manual therapy to lumbar, re-assess shoulder/neck pain.   PT Home Exercise Plan Advanced HEP with cervical retraction, and prone plank/side plank. Continued: core stability progression including pelvic bridge with LAQ and marching.    Consulted and Agree with Plan of Care Patient      Patient will benefit from skilled therapeutic intervention in order to improve the following deficits and impairments:  Decreased balance, Decreased mobility, Hypomobility, Improper body mechanics, Decreased range of motion, Decreased activity tolerance, Decreased strength, Postural dysfunction, Pain, Impaired flexibility, Impaired perceived functional ability, Decreased endurance  Visit Diagnosis: Chronic bilateral low back pain without sciatica  Abnormal posture  Muscle weakness (generalized)       G-Codes - 31-Jul-2017 11-08-27    Functional Assessment Tool Used (Outpatient Only) Clinical impression/ pain/ MODI/ joint stiffness/ muscle weakness   Functional Limitation Mobility: Walking and moving around   Mobility: Walking and Moving Around Current Status ([id number]) At least 20 percent but less than 40 percent impaired, limited or restricted   Mobility: Walking and Moving Around Goal Status [phone]) At least 1 percent but less than 20 percent impaired, limited or restricted      Problem List Patient Active Problem List   Diagnosis Date Noted  . Personal history of tobacco use, presenting hazards to health 07/11/2016  . Aortic atherosclerosis ([place]) 07/09/2016  . Osteoporosis 07/07/2016  . Myopia of both eyes 06/16/2016  . Hyperlipidemia 06/08/2016  . CAD (coronary artery disease) 06/08/2016  . Snoring 06/08/2016  . Benign hypertensive renal disease 11/14/2015   Burnett Corrente, SPT Pura Spice, PT, DPT # [id number] 07-31-17, 8:30 PM  [facility] [address] [place], [place], [address] Phone: [phone]   Fax:  [phone]  Name: Tonya Cabrera MRN: [id number] Date of Birth: Sep 29, 1950

## 2017-08-03 ENCOUNTER — Ambulatory Visit: Payer: Medicare Other | Attending: Family Medicine | Admitting: Physical Therapy

## 2017-08-03 ENCOUNTER — Encounter: Payer: Self-pay | Admitting: Physical Therapy

## 2017-08-03 DIAGNOSIS — M6281 Muscle weakness (generalized): Secondary | ICD-10-CM | POA: Diagnosis not present

## 2017-08-03 DIAGNOSIS — M545 Low back pain: Secondary | ICD-10-CM | POA: Diagnosis not present

## 2017-08-03 DIAGNOSIS — G8929 Other chronic pain: Secondary | ICD-10-CM | POA: Diagnosis not present

## 2017-08-03 DIAGNOSIS — R293 Abnormal posture: Secondary | ICD-10-CM | POA: Diagnosis not present

## 2017-08-03 NOTE — Therapy (Signed)
[facility] [address]. [place], [place], [address] Phone: [phone]   Fax:  [phone]  Physical Therapy Treatment  Patient Details  Name: Tonya Cabrera MRN: [id number] Date of Birth: Jun 10, 1951 Referring Provider: Dr. Wynetta Emery   Encounter Date: 08/03/2017  PT End of Session - 08/03/17 1441    Visit Number  7    Number of Visits  12    Date for PT Re-Evaluation  08/25/17    Authorization - Visit Number  7    Authorization - Number of Visits  10    PT Start Time  6270    PT Stop Time  3500    PT Time Calculation (min)  49 min    Activity Tolerance  Patient tolerated treatment well;No increased pain    Behavior During Therapy  WFL for tasks assessed/performed       Past Medical History:  Diagnosis Date  . Hypertension   . Myocardial infarction ([place])    2015  . Stroke [facility])    10-12 years ago    Past Surgical History:  Procedure Laterality Date  . CAROTID STENT    . INCONTINENCE SURGERY    . THYROID SURGERY      There were no vitals filed for this visit.  Subjective Assessment - 08/03/17 1349    Subjective  Pt reports that she is performing her HEP about once every 2 days. She has not noticed any change in her symptoms, but reports that she is able to work through the pain. No pain currently.     Pertinent History  66 y/o female presents with chronic recurrent LBP that has worsened since a fall in March 2018. Pt reports that she had slipped on some wet stairs. Pt reports her pain is continueing to worsen    Limitations  Lifting;Standing;Walking;House hold activities    Diagnostic tests  none    Currently in Pain?  No/denies    Pain Onset  More than a month ago        Treatment:    Ther-ex: Re-instructed HEP including: Sidelying:             Plank 3 x 15 sec each side from knees with BE support, mod cues for form/technique Prone      Plank from toes 3 x 20 sec Supine:             Cervical retraction  re-instructed with cues to press on towel placed behind neck   Nautilus:  Reverse wood chops x 15 each side 20# Pallof press x 15 facing each direction 20# Row x 15 with 5 sec hold 30# Chest press x 15 with 5 sec hold 20# (increase next session) Assessed LE flexibility with no tightness noted in hamstrings, piriformis, or gluts    PT Short Term Goals - 07/28/17 1703      PT SHORT TERM GOAL #1   Title  Pt will perform HEP 3/7 days per week in order to maximize benefit from therapy to recover functional mobility without pain.    Baseline  Pt exercises almost every day, though she only perfoms part of HEP    Time  2    Period  Weeks    Status  Partially Met    Target Date  08/11/17      PT SHORT TERM GOAL #2   Title  Pt will have increased LE flexibility in hamstrings by 6 degrees in order to enable preper lumbopelvic  rhythm with functional activity.     Baseline  58 deg LLE, 60 deg RLE   10/31 RLE 70 deg, LLE 68 deg    Time  2    Period  Weeks    Status  Achieved        PT Long Term Goals - 07/28/17 1705      PT LONG TERM GOAL #1   Title  Pt. able to get out of [place] with no increase c/o low back pain at end of work day to improve mobility.      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     Baseline  MODI: 28% disability 06/30/17      28% 10/31    Time  4    Period  Weeks    Status  Not Met    Target Date  08/25/17      PT LONG TERM GOAL #3   Title  Patient independent with HEP to increase BLE gross strength to 4+/5 as to improve functional strength for independent gait, increased standing tolerance and increased ADL ability.    Baseline   all 4+/5 except L IV    Time  4    Period  Weeks    Status  Achieved      PT LONG TERM GOAL #4   Title  Patient will report a worst pain of 3/10 on VAS in low back to improve tolerance with ADLs and reduced symptoms with activities.     Baseline  6-7/10    Time  4    Period  Weeks    Status  Not Met    Target Date  08/25/17          Plan - 08/03/17 1444    Clinical Impression Statement  Pt feels that she is not having back pain like she used to. She has been stretching intermittently at work and doing HEP several times per week. Pt had no pain with advance core stability training today; weakness noted in scapular stabilizers with plank exercise. Plan to continue core strengthening program    Clinical Presentation  Stable    Clinical Decision Making  Moderate    Rehab Potential  Good    PT Frequency  1x / week    PT Duration  4 weeks    PT Treatment/Interventions  ADLs/Self Care Home Management;Electrical Stimulation;Gait training;Stair training;Functional mobility training;Therapeutic activities;Therapeutic exercise;Balance training;Neuromuscular re-education;Cognitive remediation;Patient/family education;Manual techniques;Dry needling;Passive range of motion    PT Next Visit Plan  Further assess scapular stabilizers and consider adding shoulder push-up; continue Naudilus core strengthening    PT Home Exercise Plan  Continued: HEP with cervical retraction, and prone plank/side plank. core stability progression including pelvic bridge with LAQ and marching.     Consulted and Agree with Plan of Care  Patient       Patient will benefit from skilled therapeutic intervention in order to improve the following deficits and impairments:  Decreased balance, Decreased mobility, Hypomobility, Improper body mechanics, Decreased range of motion, Decreased activity tolerance, Decreased strength, Postural dysfunction, Pain, Impaired  flexibility, Impaired perceived functional ability, Decreased  endurance  Visit Diagnosis: Chronic bilateral low back pain without sciatica  Abnormal posture  Muscle weakness (generalized)     Problem List Patient Active Problem List   Diagnosis Date Noted  . Personal history of tobacco use, presenting hazards to health 07/11/2016  . Aortic atherosclerosis ([place]) 07/09/2016  . Osteoporosis 07/07/2016  . Myopia of both eyes 06/16/2016  . Hyperlipidemia 06/08/2016  . CAD (coronary artery disease) 06/08/2016  . Snoring 06/08/2016  . Benign hypertensive renal disease 11/14/2015   Burnett Corrente, SPT Pura Spice, PT, DPT # [phone] 08/03/2017, 2:49 PM  [place] [facility] [address] [place], [place], [address] Phone: [phone]   Fax:  [phone]  Name: Tonya Cabrera MRN: [id number] Date of Birth: Jan 17, 1951

## 2017-08-10 ENCOUNTER — Encounter: Payer: Self-pay | Admitting: Physical Therapy

## 2017-08-10 ENCOUNTER — Ambulatory Visit: Payer: Medicare Other | Admitting: Physical Therapy

## 2017-08-10 DIAGNOSIS — M545 Low back pain: Principal | ICD-10-CM

## 2017-08-10 DIAGNOSIS — R293 Abnormal posture: Secondary | ICD-10-CM

## 2017-08-10 DIAGNOSIS — G8929 Other chronic pain: Secondary | ICD-10-CM

## 2017-08-10 DIAGNOSIS — M6281 Muscle weakness (generalized): Secondary | ICD-10-CM

## 2017-08-10 NOTE — Therapy (Signed)
[facility] [address]. [place], [place], [address] Phone: [phone]   Fax:  [phone]  Physical Therapy Treatment  Patient Details  Name: Tonya Cabrera MRN: [id number] Date of Birth: 04-22-1951 Referring Provider: Dr. Wynetta Emery   Encounter Date: 08/10/2017  PT End of Session - 08/10/17 1822    Visit Number  8    Number of Visits  12    Date for PT Re-Evaluation  08/25/17    Authorization - Visit Number  8    Authorization - Number of Visits  10    PT Start Time  4650    PT Stop Time  3546    PT Time Calculation (min)  50 min    Activity Tolerance  Patient tolerated treatment well;No increased pain    Behavior During Therapy  WFL for tasks assessed/performed       Past Medical History:  Diagnosis Date  . Hypertension   . Myocardial infarction ([place])    2015  . Stroke [facility])    10-12 years ago    Past Surgical History:  Procedure Laterality Date  . CAROTID STENT    . INCONTINENCE SURGERY    . THYROID SURGERY      There were no vitals filed for this visit.  Subjective Assessment - 08/10/17 1356    Subjective  Pt had a fall in her yard last Tuesday due to yard being wet. She reports no injury, but she was sore for a few days in low back and thoracic areas.     Pertinent History  66 y/o female presents with chronic recurrent LBP that has worsened since a fall in March 2018. Pt reports that she had slipped on some wet stairs. Pt reports her pain is continueing to worsen    Limitations  Lifting;Standing;Walking;House hold activities    Diagnostic tests  none    Currently in Pain?  No/denies    Pain Onset  More than a month ago      Treatment:    Ther-ex: Sidelying:             Plank 3 x 15 sec each side from knees with BE support, mod cues for form/technique Prone              Plank from knees 3 x 20 sec   Supine:               Bridging 10x with short/ controlled holds.     Standing Nautilus:  Reverse wood  chops x 15 each side 20# Pallof press x 15 facing each direction 20# Row x 15 with 5 sec hold 30# Triceps extension x 20 with 20# Chest press x 15 with 5 sec hold 20# (increase next session)  SciFit cool-down L6 x 8.5 min BUE/BLE (un-billed)- B quad muscle fatigue reported.      PT Education - 08/10/17 1822    Education provided  Yes    Education Details  Ther-ex technique    Person(s) Educated  Patient    Methods  Explanation;Demonstration;Tactile cues;Verbal cues    Comprehension  Tactile cues required;Verbal cues required;Returned demonstration;Verbalized understanding       PT Short Term Goals - 07/28/17 1703      PT SHORT TERM GOAL #1   Title  Pt will perform HEP 3/7 days per week in order to maximize benefit from therapy to recover functional mobility without pain.    Baseline  Pt exercises almost every day, though  she only perfoms part of HEP    Time  2    Period  Weeks    Status  Partially Met    Target Date  08/11/17      PT SHORT TERM GOAL #2   Title  Pt will have increased LE flexibility in hamstrings by 6 degrees in order to enable preper lumbopelvic rhythm with functional activity.     Baseline  58 deg LLE, 60 deg RLE   10/31 RLE 70 deg, LLE 68 deg    Time  2    Period  Weeks    Status  Achieved        PT Long Term Goals - 07/28/17 1705      PT LONG TERM GOAL #1   Title  Pt. able to get out of [place] with no increase c/o low back pain at end of work day to improve mobility.      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     Baseline  MODI: 28% disability 06/30/17      28% 10/31    Time  4    Period  Weeks    Status  Not Met     Target Date  08/25/17      PT LONG TERM GOAL #3   Title  Patient independent with HEP to increase BLE gross strength to 4+/5 as to improve functional strength for independent gait, increased standing tolerance and increased ADL ability.    Baseline  all 4+/5 except L IV    Time  4    Period  Weeks    Status  Achieved      PT LONG TERM GOAL #4   Title  Patient will report a worst pain of 3/10 on VAS in low back to improve tolerance with ADLs and reduced symptoms with activities.     Baseline  6-7/10    Time  4    Period  Weeks    Status  Not Met    Target Date  08/25/17            Plan - 08/10/17 1827    Clinical Impression Statement  Pt had a fall last week with no injury. Her LBP and upper back pain were exacerbated by the fall for several day. Pt's progress is limited by work activities, which are aggravating factors, and  Minimal adherence to HEP. Plan to continue core stability training with increased emphasis on HEP adherence.     Clinical Presentation  Stable    Clinical Decision Making  Moderate    Rehab Potential  Good    PT Frequency  1x / week    PT Duration  4 weeks    PT Treatment/Interventions  ADLs/Self Care Home Management;Electrical Stimulation;Gait training;Stair training;Functional mobility training;Therapeutic activities;Therapeutic exercise;Balance training;Neuromuscular re-education;Cognitive remediation;Patient/family education;Manual techniques;Dry needling;Passive range of motion    PT Next Visit Plan  Address lifting technique, shoulder push-up; continue Naudilus core strengthening    PT Home Exercise Plan  Continued: HEP with cervical retraction, and prone plank/side plank. core stability progression including  pelvic bridge with LAQ and marching.     Consulted and Agree with Plan of Care  Patient       Patient will benefit from skilled therapeutic intervention in order to improve the following deficits and impairments:  Decreased balance, Decreased  mobility, Hypomobility, Improper body mechanics, Decreased range of motion, Decreased activity tolerance, Decreased strength, Postural dysfunction, Pain, Impaired flexibility, Impaired perceived functional ability, Decreased endurance  Visit Diagnosis: Chronic bilateral low back pain without sciatica  Abnormal posture  Muscle weakness (generalized)     Problem List Patient Active Problem List   Diagnosis Date Noted  . Personal history of tobacco use, presenting hazards to health 07/11/2016  . Aortic atherosclerosis ([place]) 07/09/2016  . Osteoporosis 07/07/2016  . Myopia of both eyes 06/16/2016  . Hyperlipidemia 06/08/2016  . CAD (coronary artery disease) 06/08/2016  . Snoring 06/08/2016  . Benign hypertensive renal disease 11/14/2015   Burnett Corrente, SPT Pura Spice, PT, DPT # [phone] 08/11/2017, 10:04 AM  [place] [address] [place], [place], [address] Phone: [phone]   Fax:  [phone]  Name: IVETTE CASTRONOVA MRN: [id number] Date of Birth: [birth year]

## 2017-08-17 ENCOUNTER — Encounter: Payer: Self-pay | Admitting: Physical Therapy

## 2017-08-17 DIAGNOSIS — H2513 Age-related nuclear cataract, bilateral: Secondary | ICD-10-CM | POA: Diagnosis not present

## 2017-08-20 ENCOUNTER — Other Ambulatory Visit: Payer: Self-pay | Admitting: Family Medicine

## 2017-08-24 ENCOUNTER — Encounter: Payer: Self-pay | Admitting: Physical Therapy

## 2017-08-24 ENCOUNTER — Ambulatory Visit: Payer: Medicare Other | Admitting: Physical Therapy

## 2017-08-24 DIAGNOSIS — M545 Low back pain, unspecified: Secondary | ICD-10-CM

## 2017-08-24 DIAGNOSIS — M6281 Muscle weakness (generalized): Secondary | ICD-10-CM | POA: Diagnosis not present

## 2017-08-24 DIAGNOSIS — R293 Abnormal posture: Secondary | ICD-10-CM

## 2017-08-24 DIAGNOSIS — G8929 Other chronic pain: Secondary | ICD-10-CM | POA: Diagnosis not present

## 2017-08-24 NOTE — Therapy (Signed)
[facility] [address] [place], [place], [address] Phone: [phone]   Fax:  [phone]  Physical Therapy Treatment  Patient Details  Name: Tonya Cabrera MRN: [id number] Date of Birth: Feb 08, 1951 Referring Provider: Dr. Wynetta Emery   Encounter Date: 08/24/2017  PT End of Session - 08/24/17 1447    Visit Number  9    Number of Visits  12    Date for PT Re-Evaluation  08/25/17    Authorization - Visit Number  9    Authorization - Number of Visits  10    PT Start Time  2633    PT Stop Time  3545    PT Time Calculation (min)  50 min    Activity Tolerance  Patient tolerated treatment well;No increased pain    Behavior During Therapy  WFL for tasks assessed/performed       Past Medical History:  Diagnosis Date  . Hypertension   . Myocardial infarction ([place])    2015  . Stroke [facility])    10-12 years ago    Past Surgical History:  Procedure Laterality Date  . CAROTID STENT    . INCONTINENCE SURGERY    . THYROID SURGERY      There were no vitals filed for this visit.  Subjective Assessment - 08/24/17 1345    Subjective  Pt reports no pain today, but she does have some low back tightness. She continues to have LBP with prolonged work.    Pertinent History  66 y/o female presents with chronic recurrent LBP that has worsened since a fall in March 2018. Pt reports that she had slipped on some wet stairs. Pt reports her pain is continueing to worsen    Limitations  Lifting;Standing;Walking;House hold activities    Diagnostic tests  none    Currently in Pain?  No/denies    Pain Onset  More than a month ago       Treatment:   Manual therapy:  Supine:    LE and hip stretching for hamstring, piriformis, and hip flexor/quads using muscle energy technique for contract-relax stretch, 5 sec contract and 20 sec relax x 3 iterations each side (mild LE neuralgia with SLR hamstring stretch)    Nerve glide for BLE in SLR position with  alternating PF/DF 2 x 10 reps each side    Following manual therapy  Ther-Activity:     Lifting technique with wooden box weighted with 10# weight from 15" surface and from floor; cues for squat technique and for upright trunk. Pt was able to left 20# box with good technique with no LBP following instruction/ demonstration x 10-12 min. Re-assessed technique following squat ther-ex with good carryover.  Following There-activity:  Ther-ex: Sidelying:             Plank 3 x 15 sec each side from knees with large red bolster arm Prone              Plank from knees 3 x 20 sec from elbows and toes Standing Nautilus:    Pallof press x 15 with 3 sec hold facing each direction 30# Standing:  Squat to mat/char 4 x 10 reps with mirror visual feedback and cues for form.    PT Education - 08/24/17 1447    Education provided  Yes    Education Details  Lifting technique, exercise technique    Person(s) Educated  Patient    Methods  Explanation;Demonstration;Tactile cues;Verbal cues    Comprehension  Returned demonstration;Verbal cues required;Tactile cues required;Verbalized understanding       PT Short Term Goals - 07/28/17 1703      PT SHORT TERM GOAL #1   Title  Pt will perform HEP 3/7 days per week in order to maximize benefit from therapy to recover functional mobility without pain.    Baseline  Pt exercises almost every day, though she only perfoms part of HEP    Time  2    Period  Weeks    Status  Partially Met    Target Date  08/11/17      PT SHORT TERM GOAL #2   Title  Pt will have increased LE flexibility in hamstrings by 6 degrees in order to enable preper lumbopelvic rhythm with functional activity.     Baseline  58 deg LLE, 60 deg RLE   10/31 RLE 70 deg, LLE 68 deg    Time  2    Period  Weeks    Status  Achieved        PT Long Term Goals - 07/28/17 1705      PT LONG TERM GOAL #1   Title  Pt. able to get out of [place] with no increase c/o low back pain at end of  work day to improve mobility.      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     Baseline  MODI: 28% disability 06/30/17      28% 10/31    Time  4    Period  Weeks    Status  Not Met    Target Date  08/25/17      PT LONG TERM GOAL #3   Title  Patient independent with HEP to increase BLE gross strength to 4+/5 as to improve functional strength for independent gait, increased standing tolerance and increased ADL ability.    Baseline  all 4+/5 except L IV    Time  4    Period  Weeks    Status  Achieved      PT LONG TERM GOAL #4   Title  Patient will report a worst pain of 3/10 on VAS in low back to improve tolerance with ADLs and reduced symptoms with activities.     Baseline  6-7/10    Time  4    Period  Weeks    Status  Not Met    Target Date  08/25/17            Plan - 08/24/17 1448    Clinical Impression Statement  Pt continues to have LBP at work that seems to be worsening due to increased workload recently. Pt reports she does part of her HEP everyday without any issues. Pt has increased pain at work with lifting heavy objects such as coolers and food trays. Pt assessed/instructed in lifting technique today requiring min-mod cues for technique and posture. Pt had no pain during session with lifting, stretching, or ther-ex.     Clinical Presentation  Stable    Clinical Decision Making  Moderate    Rehab Potential  Good    PT Frequency  1x / week    PT Duration  4 weeks    PT Treatment/Interventions  ADLs/Self Care Home Management;Electrical Stimulation;Gait training;Stair training;Functional mobility training;Therapeutic activities;Therapeutic  exercise;Balance training;Neuromuscular re-education;Cognitive remediation;Patient/family education;Manual techniques;Dry needling;Passive range of motion    PT Next Visit Plan  Re-assess lifting technique, shoulder push-up; continue Naudilus core strengthening    PT Home Exercise Plan  Continued: HEP with cervical retraction, and prone plank/side plank. core stability progression including pelvic bridge with LAQ and marching.     Consulted and Agree with Plan of Care  Patient       Patient will benefit from skilled therapeutic intervention in order to improve the following deficits and impairments:  Decreased balance, Decreased mobility, Hypomobility, Improper body mechanics, Decreased range of motion, Decreased activity tolerance, Decreased strength, Postural dysfunction, Pain, Impaired flexibility, Impaired perceived functional ability, Decreased endurance  Visit Diagnosis: Chronic bilateral low back pain without sciatica  Abnormal posture  Muscle weakness (generalized)     Problem List Patient Active Problem List   Diagnosis Date Noted  . Personal history of tobacco use, presenting hazards to health 07/11/2016  . Aortic atherosclerosis ([place]) 07/09/2016  . Osteoporosis 07/07/2016  . Myopia of both eyes 06/16/2016  . Hyperlipidemia 06/08/2016  . CAD (coronary artery disease) 06/08/2016  . Snoring 06/08/2016  . Benign hypertensive renal disease 11/14/2015   Burnett Corrente, SPT Pura Spice, PT, DPT # [phone] 08/24/2017, 4:59 PM  [place] [facility] [address] [place], [place], [address] Phone: [phone]   Fax:  [phone]  Name: Tonya Cabrera MRN: [id number] Date of Birth: 20-Jan-1951

## 2017-08-31 ENCOUNTER — Other Ambulatory Visit: Payer: Self-pay | Admitting: Family Medicine

## 2017-09-29 DIAGNOSIS — L570 Actinic keratosis: Secondary | ICD-10-CM | POA: Diagnosis not present

## 2017-09-29 DIAGNOSIS — L821 Other seborrheic keratosis: Secondary | ICD-10-CM | POA: Diagnosis not present

## 2017-09-29 DIAGNOSIS — L814 Other melanin hyperpigmentation: Secondary | ICD-10-CM | POA: Diagnosis not present

## 2017-09-29 DIAGNOSIS — D229 Melanocytic nevi, unspecified: Secondary | ICD-10-CM | POA: Diagnosis not present

## 2017-09-29 DIAGNOSIS — Z85828 Personal history of other malignant neoplasm of skin: Secondary | ICD-10-CM | POA: Diagnosis not present

## 2017-10-04 ENCOUNTER — Encounter: Payer: Self-pay | Admitting: Psychology

## 2017-10-04 ENCOUNTER — Ambulatory Visit (INDEPENDENT_AMBULATORY_CARE_PROVIDER_SITE_OTHER): Payer: Medicare Other | Admitting: Psychology

## 2017-10-04 DIAGNOSIS — R413 Other amnesia: Secondary | ICD-10-CM

## 2017-10-04 DIAGNOSIS — F419 Anxiety disorder, unspecified: Secondary | ICD-10-CM

## 2017-10-04 NOTE — Progress Notes (Signed)
NEUROBEHAVIORAL STATUS EXAM (CPT: [phone])  Name: Tonya Cabrera Date of Birth: 12-Mar-1951 Date of Interview: 10/04/2017  Reason for Referral:  CORRINNA Cabrera is a 67 y.o. female who is referred for neuropsychological evaluation by Dr. Park Liter of [facility] due to concerns about memory loss. This patient is unaccompanied in the office for today's visit.  History of Presenting Problem:  Ms. Back reports gradual onset of memory difficulty at least 5 years ago, with worsening after her heart attack in 2015. She feels the memory and thinking problems have continued to progress over time since then. She reports short term memory loss (difficulty recalling recent conversations/events), frequently misplacing items, difficulty concentrating, distractibility and starting but not completing tasks, feeling overwhelmed by cognitively involved tasks such as completing paperwork or phone calls, word finding difficulty, and trouble following what people are saying when they are providing information to her. 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 She continues to manage all instrumental ADLs but reports difficulty with driving (no accidents, but close calls; also getting lost and then "panicking"), meal preparation (getting distracted and burning food on the stove), managing appointments (forgot and missed her first appt with [place]), managing finances (has forgotten to pay bills), and managing medications (has forgotten to take meds and one day she accidentally took x2 because was confused about what day it was). She lives in her own home; her adult daughter and son-in-law have been staying with her for the past 3 1/2 years. This is a source of significant stress for the patient.   She does have a family history of dementia in her mother who started showing signs around age 9 (died at age 15).  She denied psychiatric history. She has never sought  treatment from a mental health provider nor been treated for any psychiatric condition. She admits her current mood is "terrible, angry". She states she is lashing out frequently, which is very uncharacteristic of her. She feels this is due to stress level. She also is experiencing more anxiety and loss of self confidence due to perceived cognitive decline. She states that if results of this evaluation suggest she should have counseling or mental health treatment, she is willing to do this.  She has a history of insomnia and is treated with 5 mg Ambien qhs. She reports she sleeps well and wakes up feeling rested with this medication. She has had a sleep study in the past which revealed maintenance insomnia but no sleep apnea.  Physically, she complains of reduced balance. She has had a few falls, but no head injury or LOC. She trips on items frequently.  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  Marital history: Divorced Children: 3 adult children, 5 grandsons, 5 great-grandchildren Alcohol: None Tobacco:  Smoked for over 25 yrs, quit after heart attack in 2015   Medical History: Past Medical History:  Diagnosis Date  . Hypertension   . Myocardial infarction ([place])    2015  . Stroke [facility])    10-12 years ago     Current Medications:  Outpatient Encounter Medications as of 10/04/2017  Medication Sig  . alendronate (FOSAMAX) 70 MG tablet TAKE ONE TABLET BY MOUTH EVERY 7 DAYS ON EMPTY STOMACH WITH FULL GLASS OF WATER  .  aspirin 81 MG EC tablet TAKE 1 TABLET (81 MG TOTAL) BY MOUTH DAILY.  Marland Kitchen atorvastatin (LIPITOR) 40 MG tablet Take 1 tablet (40 mg total) by mouth daily.  . clopidogrel (PLAVIX) 75 MG tablet Take 1 tablet (75 mg total) by mouth daily.  . cyclobenzaprine  (FLEXERIL) 10 MG tablet TAKE 1 TABLET BY MOUTH AT BEDTIME  . furosemide (LASIX) 20 MG tablet Take 1 tablet (20 mg total) by mouth every other day.  . lisinopril (PRINIVIL,ZESTRIL) 10 MG tablet Take 1 tablet (10 mg total) by mouth daily.  . metoprolol succinate (TOPROL-XL) 25 MG 24 hr tablet Take 1 tablet (25 mg total) by mouth 2 (two) times daily.  . naproxen (NAPROSYN) 500 MG tablet Take 1 tablet (500 mg total) by mouth 2 (two) times daily with a meal.  . zolpidem (AMBIEN) 5 MG tablet Take 1 tablet (5 mg total) by mouth at bedtime as needed for sleep.   No facility-administered encounter medications on file as of 10/04/2017.      Behavioral Observations:   Appearance: Neatly and appropriately dressed and groomed Gait: Ambulated independently, no gross abnormalities observed Speech: Fluent; normal rate, rhythm and volume. No significant word finding difficulty observed during conversational speech. Thought process: Linear, goal directed Affect: Full, anxious Interpersonal: Very pleasant, appropriate   TESTING: There is medical necessity to proceed with neuropsychological assessment as the results will be used to aid in differential diagnosis and clinical decision-making and to inform specific treatment recommendations. Per the patient and medical records reviewed, there has been a change in cognitive functioning and a reasonable suspicion of neurocognitive disorder (rule out vascular cognitive impairment, rule out neurodegenerative dementia, rule out psychological condition such as anxiety/depression contributing to cognitive symptoms).  Clinical Decision Making: In considering the patient's current level of functioning, level of presumed impairment, nature of symptoms, emotional and behavioral responses during the interview, level of literacy, and observed level of motivation, a battery of tests was selected and communicated to the psychometrician.   Following the clinical  interview/neurobehavioral status exam, the patient completed this full battery of neuropsychological testing with my psychometrician under my supervision (see separate note).   PLAN: The patient will return to see me for a follow-up session at which time her test performances and my impressions and treatment recommendations will be reviewed in detail.  Evaluation ongoing; full report to follow.

## 2017-10-04 NOTE — Progress Notes (Signed)
   Neuropsychology Note  Tonya Cabrera completed 60 minutes of neuropsychological testing with technician, Milana Kidney, BS, under the supervision of Dr. Macarthur Critchley, Licensed Psychologist. The patient did not appear overtly distressed by the testing session, per behavioral observation or via self-report to the technician. Rest breaks were offered.   Communication between the psychologist and technician was ongoing throughout the testing session and changes were made as deemed necessary based on patient performance on testing, technician observations and additional pertinent factors (e.g., current level of functioning, level of presumed impairment, nature of symptoms, emotional and behavioral responses during the status exam/testing, level of literacy, estimated premorbid baseline intellectual abilities, and observed level of motivation/effort).  Tonya Cabrera will return within 2 weeks for a feedback session with Dr. Si Raider at which time her test performances, clinical impressions and treatment recommendations will be reviewed in detail. The patient understands she can contact our office should she require our assistance before this time.  60 minutes spent face-to-face with patient administering standardized tests Environmental education officer). 30 minutes spent scoring Environmental education officer).  Full report to follow.

## 2017-10-13 ENCOUNTER — Telehealth: Payer: Self-pay | Admitting: Family Medicine

## 2017-10-13 NOTE — Telephone Encounter (Signed)
Copied from [place] [id number]. Topic: Medical Record Request - Attorney/Litigation >> Oct 13, 2017  9:04 AM Cleaster Corin, NT wrote: Patient Name/DOB/MRN #: Ameerah Rosal/Jul 12, 1951/[id number] Requestor Name/Agency:  Call Back #: [phone] Information Requested:pt. Requesting medical records for benefits   Route to [place] for [place] clinics. For all other clinics, route to the clinic's PEC Pool.

## 2017-10-14 NOTE — Telephone Encounter (Signed)
Spoke with pt and explained that she would have to fill out a release form and she stated that she will be stopping by to fill it out.

## 2017-10-22 NOTE — Progress Notes (Signed)
NEUROPSYCHOLOGICAL EVALUATION   Name:    Tonya Cabrera  Date of Birth:   1951/09/21 Date of Interview:  10/04/2017 Date of Testing:  10/04/2017   Date of Feedback:  10/25/2017       Background Information:  Reason for Referral:  Tonya Cabrera is a 67 y.o. female referred by Dr. Park Liter to assess her current level of cognitive functioning and assist in differential diagnosis. The current evaluation consisted of a review of available medical records, an interview with the Tonya Cabrera, and the completion of a neuropsychological testing battery. Informed consent was obtained.  History of Presenting Problem:  Tonya Cabrera reports gradual onset of memory difficulty at least 5 years ago, with worsening after her heart attack in 2015. Tonya Cabrera feels the memory and thinking problems have continued to progress over time since then. Tonya Cabrera reports short term memory loss (difficulty recalling recent conversations/events), frequently misplacing items, difficulty concentrating, distractibility and starting but not completing tasks, feeling overwhelmed by cognitively involved tasks such as completing paperwork or phone calls, word finding difficulty, and trouble following what people are saying when they are providing information to her. 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 Tonya Cabrera continues to manage all instrumental ADLs but reports difficulty with driving (no accidents, but close calls; also getting lost and then "panicking"), meal preparation (getting distracted and burning food on the stove), managing appointments (forgot and missed her first appt with [place]), managing finances (has forgotten to pay bills), and managing medications (has forgotten to take meds and one day Tonya Cabrera accidentally took x2 because was confused about what day it was). Tonya Cabrera lives in her own home; her adult daughter and son-in-law have been staying with her for the past 3 1/2 years. This is a source of significant stress for the  Tonya Cabrera.   Tonya Cabrera does have a family history of dementia in her mother who started showing signs around age 51 (died at age 19).  Tonya Cabrera denied psychiatric history. Tonya Cabrera has never sought treatment from a mental health provider nor been treated for any psychiatric condition. Tonya Cabrera admits her current mood is "terrible, angry". Tonya Cabrera states Tonya Cabrera is lashing out frequently, which is very uncharacteristic of her. Tonya Cabrera feels this is due to stress level. Tonya Cabrera also is experiencing more anxiety and loss of self confidence due to perceived cognitive decline. Tonya Cabrera states that if results of this evaluation suggest Tonya Cabrera should have counseling or mental health treatment, Tonya Cabrera is willing to do this.  Tonya Cabrera has a history of insomnia and is treated with 5 mg Ambien qhs. Tonya Cabrera reports Tonya Cabrera sleeps well and wakes up feeling rested with this medication. Tonya Cabrera has had a sleep study in the past which revealed maintenance insomnia but no sleep apnea.  Physically, Tonya Cabrera complains of reduced balance. Tonya Cabrera has had a few falls, but no head injury or LOC. Tonya Cabrera trips on items frequently.  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  Marital history: Divorced Children: 3 adult children, 5 grandsons, 5 great-grandchildren Alcohol: None Tobacco:  Smoked for over 25 yrs, quit after heart attack in 2015   Medical History:  Past Medical History:  Diagnosis Date  . Hypertension   . Myocardial infarction ([place])    2015  . Stroke ([place])    10-12 years ago  Current medications:  Outpatient Encounter Medications as of 10/25/2017  Medication Sig  . alendronate (FOSAMAX) 70 MG tablet TAKE ONE TABLET BY MOUTH EVERY 7 DAYS ON EMPTY STOMACH WITH FULL GLASS OF WATER  . aspirin 81 MG EC tablet TAKE 1 TABLET (81 MG TOTAL) BY MOUTH DAILY.    Marland Kitchen atorvastatin (LIPITOR) 40 MG tablet Take 1 tablet (40 mg total) by mouth daily.  . clopidogrel (PLAVIX) 75 MG tablet Take 1 tablet (75 mg total) by mouth daily.  . cyclobenzaprine (FLEXERIL) 10 MG tablet TAKE 1 TABLET BY MOUTH AT BEDTIME  . furosemide (LASIX) 20 MG tablet Take 1 tablet (20 mg total) by mouth every other day.  . lisinopril (PRINIVIL,ZESTRIL) 10 MG tablet Take 1 tablet (10 mg total) by mouth daily.  . metoprolol succinate (TOPROL-XL) 25 MG 24 hr tablet Take 1 tablet (25 mg total) by mouth 2 (two) times daily.  . naproxen (NAPROSYN) 500 MG tablet Take 1 tablet (500 mg total) by mouth 2 (two) times daily with a meal.  . zolpidem (AMBIEN) 5 MG tablet Take 1 tablet (5 mg total) by mouth at bedtime as needed for sleep.   No facility-administered encounter medications on file as of 10/25/2017.      Current Examination:  Behavioral Observations:  Appearance: Neatly and appropriately dressed and groomed Gait: Ambulated independently, no gross abnormalities observed Speech: Fluent; normal rate, rhythm and volume. No significant word finding difficulty observed during conversational speech. Thought process: Linear, goal directed Affect: Full, anxious Interpersonal: Very pleasant, appropriate Orientation: Oriented to person, place and most aspects of time (off by one day on the day of the week). Accurately named the current President and his predecessor.   Tests Administered: . Test of Premorbid Functioning (TOPF) . Wechsler Adult Intelligence Scale-Fourth Edition (WAIS-IV): Similarities, Music therapist, Coding and Digit Span subtests . Wechsler Memory Scale-Fourth Edition (WMS-IV) Older Adult Version (ages 18-90): Logical Memory I, II and Recognition subtests  . [employer] Verbal Learning Test - 2nd Edition (CVLT-2) Short Form . Repeatable Battery for the Assessment of Neuropsychological Status (RBANS) Form A:  Figure Copy and Recall subtests and Semantic Fluency subtest . Boston  Naming Test (BNT) . Boston Diagnostic Aphasia Examination: Complex Ideational Material subtest . Controlled Oral Word Association Test (COWAT) . Trail Making Test A and B . Clock drawing test . Beck Depression Inventory - 2nd Edition (BDI-II) . Generalized Anxiety Disorder - 7 item screener (GAD-7)  Test Results: Note: Standardized scores are presented only for use by appropriately trained professionals and to allow for any future test-retest comparison. These scores should not be interpreted without consideration of all the information that is contained in the rest of the report. The most recent standardization samples from the test publisher or other sources were used whenever possible to derive standard scores; scores were corrected for age, gender, ethnicity and education when available.   Test Scores:  Test Name Raw Score Standardized Score Descriptor  TOPF 59/70 SS= 116 High average  WAIS-IV Subtests     Similarities 14/36 ss= 5 Borderline  Block Design 24/66 ss= 8 Low end of average  Coding 46/135 ss= 8 Low end of average  Digit Span Forward 8/16 ss= 8 Low end of average  Digit Span Backward 8/16 ss= 10 Average  WMS-IV Subtests     LM I 23/53 ss= 7 Low average  LM II 6/39 ss= 4 Impaired  LM II Recognition 16/23 Cum %: 10-16   RBANS Subtests     Figure Copy  18/20 Z= -0.1 Average  Figure Recall 9/20 Z= -1.2 Low average  Semantic Fluency 12 Z= -2 Impaired  CVLT-II Scores     Trial 1 5/9 Z= -0.5 Average  Trial 4 7/9 Z= -1 Low average  Trials 1-4 total 23/36 T= 39 Low average  SD Free Recall 5/9 Z= -1.5 Borderline  LD Free Recall 6/9 Z= -0.5 Average  LD Cued Recall 8/9 Z= 0.5 Average  Recognition Discriminability 8/9 hits, 0 false positives Z= 0 Average  Forced Choice Recognition 9/9  WNL  BNT 55/60 T= 47 Average  BDAE Subtest     Complex Ideational Material 10/12    COWAT-FAS 42 T= 50 Average  COWAT-Animals 14 T= 40 Low average   Trail Making Test A  49" 0 errors T=  39 Low average  Trail Making Test B  81" 0 errors T= 50 Average  Clock Drawing   WNL  BDI-II 33/63  Severe  GAD-7 13/21  Moderate      Description of Test Results:  Premorbid verbal intellectual abilities were estimated to have been within the high average range based on a test of word reading. Psychomotor processing speed was low average to average. Auditory attention and working memory were average. Visual-spatial construction was average. Language abilities were variable. Specifically, confrontation naming was average, and semantic verbal fluency ranged from impaired (for fruits/vegetables) to low average (for animals). Auditory comprehension of complex ideational material was slightly below expectation. With regard to verbal memory, encoding and acquisition of non-contextual information (i.e., word list) was low average. After a brief distracter task, free recall was borderline (5/9 items recalled). After a delay, free recall was average (6/9 items). Cued recall was average (8/9 items). Performance on a yes/no recognition task was average. On another verbal memory test, encoding and acquisition of contextual auditory information (i.e., short stories) was low average. After a delay, free recall was impaired. Performance on a yes/no recognition task was below expectation. With regard to non-verbal memory, delayed free recall of visual information was low average. Executive functioning was somewhat variable. Mental flexibility and set-shifting were average on Trails B. Verbal fluency with phonemic search restrictions was average. Verbal abstract reasoning was borderline impaired. Performance on a clock drawing task was within normal limits. On a self-report measure of mood, the Tonya Cabrera's responses were indicative of clinically significant depression at the present time. Symptoms endorsed included: sadness, pessimism, feelings of failure, anhedonia, guilty feelings, punishment feelings, self dislike,  self criticalness, tearfulness, restlessness, anhedonia, indecisiveness, worthlessness, loss of energy, decreased sleep, irritability, concentration difficulty, fatigue and loss of libido. Tonya Cabrera denied suicidal ideation or intention. On a self-report measure of anxiety, the Tonya Cabrera endorsed clinically significant generalized anxiety at the present time, characterized by daily excessive worry and inability to control worry, as well as frequent restlessness, irritability, nervousness, difficulty relaxing, and fear of something awful happening.   Clinical Impressions: Major depressive episode, moderate. Results of cognitive testing were largely within normal limits but did some some variability in memory encoding/retrieval, verbal fluency and executive functioning. Her test results were NOT consistent with dementia, and the pattern of test results did not clearly indicate a neurocognitive disorder. Meanwhile, Tonya Cabrera is presenting with significant emotional distress characterized by depression and anxiety, and meets diagnostic criteria for a major depressive episode. This appears related to current situational factors (I.e., family tension). Given all the available information, it appears most likely that the Tonya Cabrera's subjective cognitive complaints and variability among some cognitive domains on formal testing is due to  significant depression/anxiety and NOT an underlying neurocognitive disorder.     Recommendations/Plan: Based on the findings of the present evaluation, the following recommendations are offered:  1. Mental health treatment: The Tonya Cabrera will likely benefit from a combination of mental health counseling and antidepressant medication (e.g., Zoloft). Tonya Cabrera will speak with her physician further about medication options as well as local referrals for counselors. (I offered a referral to [facility] but Tonya Cabrera understandably does not want to drive to [facility] for mental health counseling).    2. Tonya Cabrera should continue to engage in activities that provide mental stimulation, social interaction and safe cardiovascular exercise in order to maintain brain health.  3. If the Tonya Cabrera continues to complain of cognitive decline despite mental health treatment, Tonya Cabrera can be referred back for neuropsychological re-evaluation, and the current test results will serve as a baseline for comparison.    Feedback to Tonya Cabrera: Tonya Cabrera returned for a feedback appointment on 10/25/2017 to review the results of her neuropsychological evaluation with this provider. 15 minutes face-to-face time was spent reviewing her test results, my impressions and my recommendations as detailed above.    Total time spent on this Tonya Cabrera's case: [phone] (1 unit) for neurobehavioral status exam with psychologist; [phone] and [phone] units of testing/scoring by psychometrician under psychologist's supervision; [phone] and [phone] units for integration of Tonya Cabrera data, interpretation of standardized test results and clinical data, clinical decision making, treatment planning and preparation of this report, and interactive feedback with review of results to the Tonya Cabrera/family by psychologist.      Thank you for your referral of Tonya Cabrera. Please feel free to contact me if you have any questions or concerns regarding this report.

## 2017-10-25 ENCOUNTER — Ambulatory Visit: Payer: Medicare Other | Admitting: Psychology

## 2017-10-25 ENCOUNTER — Encounter: Payer: Self-pay | Admitting: Psychology

## 2017-10-25 DIAGNOSIS — R413 Other amnesia: Secondary | ICD-10-CM

## 2017-10-25 DIAGNOSIS — F321 Major depressive disorder, single episode, moderate: Secondary | ICD-10-CM

## 2017-10-25 NOTE — Patient Instructions (Signed)
Fortunately, results of cognitive testing were NOT indicative of dementia or a neurocognitive disorder.  However it appears you are experiencing a high level of depression and anxiety.  It is most likely the case that your cognitive symptoms in daily life are due to depression/anxiety and stress.  Below is some more information on this. I am hopeful that more aggressive treatment of depression and anxiety (e.g. psychotherapy) will not only improve your mood and coping, but also result in improved cognitive functioning in your daily life.    The effect of depression and anxiety on your cognitive functioning: . One of the typical symptoms of depression is difficulty concentrating and making decisions, and various types of anxiety also interfere with attention and concentration . Problems with attention and concentration can disrupt the process of learning and making new memories, which can make it seem like there is a problem with your memory. In your daily life, you may experience this disruption as forgetting names and appointments, misplacing items, and needing to make lists for shopping and errands. It may be harder for you to stay focused on tasks and feel as "sharp" as you did in the past.  . Also, when we are depressed or anxious, we often pay more attention to our difficulties (rather than our strengths) in our daily life, and this can make it seem to [place] like we are doing worse cognitively than we really are. . The cognitive aspects of depression and anxiety are sometimes observed as an identifiable pattern of poor performance on a neuropsychological evaluation, but it is also possible that all scores on an evaluation are within normal limits. . Regardless of the test scores, distress related to depression and anxiety can interfere with the ability to make use of your cognitive resources and function optimally across settings such as work or school, maintaining the home and responsibilities, and  personal relationships. . Fortunately, there are treatments for depression and anxiety, and when mood improves, cognitive functioning in daily life often improves. . Treatment options include psychotherapy, medications (e.g., antidepressants), and behavioral changes, such as increasing your involvement in enjoyable activities, increasing the amount of exercise you are getting, and maintaining a regular routine.

## 2017-10-27 DIAGNOSIS — H2511 Age-related nuclear cataract, right eye: Secondary | ICD-10-CM | POA: Diagnosis not present

## 2017-10-27 DIAGNOSIS — D3131 Benign neoplasm of right choroid: Secondary | ICD-10-CM | POA: Diagnosis not present

## 2017-10-27 DIAGNOSIS — H2513 Age-related nuclear cataract, bilateral: Secondary | ICD-10-CM | POA: Diagnosis not present

## 2017-11-05 ENCOUNTER — Encounter: Payer: Self-pay | Admitting: Family Medicine

## 2017-11-08 NOTE — Discharge Instructions (Signed)

## 2017-11-09 ENCOUNTER — Encounter
Admission: RE | Disposition: A | Discharge status: Home / Self Care | Payer: Self-pay | Source: Ambulatory Visit | Attending: Ophthalmology

## 2017-11-09 ENCOUNTER — Ambulatory Visit
Admission: RE | Admit: 2017-11-09 | Discharge: 2017-11-09 | Disposition: A | Discharge status: Home / Self Care | Payer: Medicare Other | Source: Ambulatory Visit | Attending: Ophthalmology | Admitting: Ophthalmology

## 2017-11-09 ENCOUNTER — Ambulatory Visit: Payer: Medicare Other | Admitting: Anesthesiology

## 2017-11-09 DIAGNOSIS — R0602 Shortness of breath: Secondary | ICD-10-CM | POA: Insufficient documentation

## 2017-11-09 DIAGNOSIS — Z87891 Personal history of nicotine dependence: Secondary | ICD-10-CM | POA: Diagnosis not present

## 2017-11-09 DIAGNOSIS — I10 Essential (primary) hypertension: Secondary | ICD-10-CM | POA: Insufficient documentation

## 2017-11-09 DIAGNOSIS — Z955 Presence of coronary angioplasty implant and graft: Secondary | ICD-10-CM | POA: Diagnosis not present

## 2017-11-09 DIAGNOSIS — M810 Age-related osteoporosis without current pathological fracture: Secondary | ICD-10-CM | POA: Insufficient documentation

## 2017-11-09 DIAGNOSIS — E7800 Pure hypercholesterolemia, unspecified: Secondary | ICD-10-CM | POA: Diagnosis not present

## 2017-11-09 DIAGNOSIS — Z79899 Other long term (current) drug therapy: Secondary | ICD-10-CM | POA: Diagnosis not present

## 2017-11-09 DIAGNOSIS — H2511 Age-related nuclear cataract, right eye: Secondary | ICD-10-CM | POA: Diagnosis not present

## 2017-11-09 DIAGNOSIS — I252 Old myocardial infarction: Secondary | ICD-10-CM | POA: Diagnosis not present

## 2017-11-09 DIAGNOSIS — M1990 Unspecified osteoarthritis, unspecified site: Secondary | ICD-10-CM | POA: Insufficient documentation

## 2017-11-09 DIAGNOSIS — Z7982 Long term (current) use of aspirin: Secondary | ICD-10-CM | POA: Insufficient documentation

## 2017-11-09 DIAGNOSIS — R011 Cardiac murmur, unspecified: Secondary | ICD-10-CM | POA: Insufficient documentation

## 2017-11-09 DIAGNOSIS — I2510 Atherosclerotic heart disease of native coronary artery without angina pectoris: Secondary | ICD-10-CM | POA: Diagnosis not present

## 2017-11-09 HISTORY — DX: Dyspnea, unspecified: R06.00

## 2017-11-09 HISTORY — PX: CATARACT EXTRACTION W/PHACO: SHX586

## 2017-11-09 HISTORY — DX: Cardiac murmur, unspecified: R01.1

## 2017-11-09 HISTORY — DX: Pure hypercholesterolemia, unspecified: E78.00

## 2017-11-09 HISTORY — DX: Atherosclerotic heart disease of native coronary artery without angina pectoris: I25.10

## 2017-11-09 HISTORY — DX: Unspecified osteoarthritis, unspecified site: M19.90

## 2017-11-09 HISTORY — DX: Unspecified hearing loss, unspecified ear: H91.90

## 2017-11-09 HISTORY — DX: Age-related osteoporosis without current pathological fracture: M81.0

## 2017-11-09 SURGERY — PHACOEMULSIFICATION, CATARACT, WITH IOL INSERTION
Anesthesia: Monitor Anesthesia Care | Site: Eye | Laterality: Right | Wound class: Clean

## 2017-11-09 MED ORDER — FENTANYL CITRATE (PF) 100 MCG/2ML IJ SOLN
INTRAMUSCULAR | Status: DC | PRN
  Administered 2017-11-09: 50 ug via INTRAVENOUS

## 2017-11-09 MED ORDER — LACTATED RINGERS IV SOLN: 10.0000 mL/h | INTRAVENOUS | Status: DC

## 2017-11-09 MED ORDER — OXYCODONE HCL 5 MG PO TABS: 5.0000 mg | ORAL_TABLET | Freq: Once | ORAL | Status: DC | PRN

## 2017-11-09 MED ORDER — MOXIFLOXACIN HCL 0.5 % OP SOLN
OPHTHALMIC | Status: DC | PRN
  Administered 2017-11-09: 1 [drp] via OPHTHALMIC

## 2017-11-09 MED ORDER — FENTANYL CITRATE (PF) 100 MCG/2ML IJ SOLN: 25.0000 ug | INTRAMUSCULAR | Status: DC | PRN

## 2017-11-09 MED ORDER — LIDOCAINE HCL (PF) 2 % IJ SOLN
INTRAOCULAR | Status: DC | PRN
  Administered 2017-11-09: 1 mL via INTRAOCULAR

## 2017-11-09 MED ORDER — SODIUM HYALURONATE 23 MG/ML IO SOLN
INTRAOCULAR | Status: DC | PRN
  Administered 2017-11-09: 0.6 mL via INTRAOCULAR

## 2017-11-09 MED ORDER — SODIUM HYALURONATE 10 MG/ML IO SOLN
INTRAOCULAR | Status: DC | PRN
  Administered 2017-11-09: 0.55 mL via INTRAOCULAR

## 2017-11-09 MED ORDER — LACTATED RINGERS IV SOLN: 1000.0000 mL | INTRAVENOUS | Status: DC

## 2017-11-09 MED ORDER — MEPERIDINE HCL 25 MG/ML IJ SOLN: 6.2500 mg | INTRAMUSCULAR | Status: DC | PRN

## 2017-11-09 MED ORDER — EPINEPHRINE PF 1 MG/ML IJ SOLN
INTRAOCULAR | Status: DC | PRN
  Administered 2017-11-09: 64 mL via OPHTHALMIC

## 2017-11-09 MED ORDER — PROMETHAZINE HCL 25 MG/ML IJ SOLN: 6.2500 mg | INTRAMUSCULAR | Status: DC | PRN

## 2017-11-09 MED ORDER — MIDAZOLAM HCL 2 MG/2ML IJ SOLN
INTRAMUSCULAR | Status: DC | PRN
  Administered 2017-11-09 (×2): 1 mg via INTRAVENOUS

## 2017-11-09 MED ORDER — OXYCODONE HCL 5 MG/5ML PO SOLN: 5.0000 mg | Freq: Once | ORAL | Status: DC | PRN

## 2017-11-09 MED ORDER — ARMC OPHTHALMIC DILATING DROPS
1.0000 "application " | OPHTHALMIC | Status: DC | PRN
  Administered 2017-11-09 (×3): 1 via OPHTHALMIC

## 2017-11-09 SURGICAL SUPPLY — 17 items
CANNULA ANT/CHMB 27G (MISCELLANEOUS) ×1 IMPLANT
CANNULA ANT/CHMB 27GA (MISCELLANEOUS) ×3 IMPLANT
DISSECTOR HYDRO NUCLEUS 50X22 (MISCELLANEOUS) ×3 IMPLANT
GLOVE BIO SURGEON STRL SZ8 (GLOVE) ×3 IMPLANT
GLOVE SURG LX 7.5 STRW (GLOVE) ×2
GLOVE SURG LX STRL 7.5 STRW (GLOVE) ×1 IMPLANT
GOWN STRL REUS W/ TWL LRG LVL3 (GOWN DISPOSABLE) ×2 IMPLANT
GOWN STRL REUS W/TWL LRG LVL3 (GOWN DISPOSABLE) ×6
LENS IOL TECNIS ITEC 14.0 (Intraocular Lens) ×2 IMPLANT
MARKER SKIN DUAL TIP RULER LAB (MISCELLANEOUS) ×3 IMPLANT
PACK CATARACT (MISCELLANEOUS) ×3 IMPLANT
PACK DR. KING ARMS (PACKS) ×3 IMPLANT
PACK EYE AFTER SURG (MISCELLANEOUS) ×3 IMPLANT
SYR 3ML LL SCALE MARK (SYRINGE) ×3 IMPLANT
SYR TB 1ML LUER SLIP (SYRINGE) ×3 IMPLANT
WATER STERILE IRR 500ML POUR (IV SOLUTION) ×3 IMPLANT
WIPE NON LINTING 3.25X3.25 (MISCELLANEOUS) ×3 IMPLANT

## 2017-11-09 NOTE — Transfer of Care (Signed)
Immediate Anesthesia Transfer of Care Note  Patient: Tonya Cabrera  Procedure(s) Performed: CATARACT EXTRACTION PHACO AND INTRAOCULAR LENS PLACEMENT (IOC) RIGHT (Right Eye)  Patient Location: PACU  Anesthesia Type: MAC  Level of Consciousness: awake, alert  and patient cooperative  Airway and Oxygen Therapy: Patient Spontanous Breathing and Patient connected to supplemental oxygen  Post-op Assessment: Post-op Vital signs reviewed, Patient's Cardiovascular Status Stable, Respiratory Function Stable, Patent Airway and No signs of Nausea or vomiting  Post-op Vital Signs: Reviewed and stable  Complications: No apparent anesthesia complications

## 2017-11-09 NOTE — H&P (Signed)
The History and Physical notes are on paper, have been signed, and are to be scanned.   I have examined the patient and there are no changes to the H&P.   Benay Pillow 11/09/2017 8:14 AM

## 2017-11-09 NOTE — Anesthesia Procedure Notes (Signed)
Procedure Name: MAC Date/Time: 11/09/2017 8:23 AM Performed by: Cameron Ali, CRNA Pre-anesthesia Checklist: Patient identified, Emergency Drugs available, Suction available, Timeout performed and Patient being monitored Patient Re-evaluated:Patient Re-evaluated prior to induction Oxygen Delivery Method: Nasal cannula Placement Confirmation: positive ETCO2

## 2017-11-09 NOTE — Op Note (Signed)
OPERATIVE NOTE  Tonya Cabrera [id number] 11/09/2017   PREOPERATIVE DIAGNOSIS:  Nuclear sclerotic cataract right eye.  H25.11   POSTOPERATIVE DIAGNOSIS:    Nuclear sclerotic cataract right eye.     PROCEDURE:  Phacoemusification with posterior chamber intraocular lens placement of the right eye   LENS:   Implant Name Type Inv. Item Serial No. Manufacturer Lot No. LRB No. Used  LENS IOL DIOP 14.0 - [id number] Intraocular Lens LENS IOL DIOP 14.0 [id number]  Right 1       PCB00 +14.0   ULTRASOUND TIME: 0 minutes 34.7 seconds.  CDE 5.49   SURGEON:  Benay Pillow, MD, MPH  ANESTHESIOLOGIST: Anesthesiologist: Marice Potter, MD CRNA: Cameron Ali, CRNA   ANESTHESIA:  Topical with tetracaine drops augmented with 1% preservative-free intracameral lidocaine.  ESTIMATED BLOOD LOSS: less than 1 mL.   COMPLICATIONS:  None.   DESCRIPTION OF PROCEDURE:  The patient was identified in the holding room and transported to the operating room and placed in the supine position under the operating microscope.  The right eye was identified as the operative eye and it was prepped and draped in the usual sterile ophthalmic fashion.   A 1.0 millimeter clear-corneal paracentesis was made at the 10:30 position. 0.5 ml of preservative-free 1% lidocaine with epinephrine was injected into the anterior chamber.  The anterior chamber was filled with Healon 5 viscoelastic.  A 2.4 millimeter keratome was used to make a near-clear corneal incision at the 8:00 position.  A curvilinear capsulorrhexis was made with a cystotome and capsulorrhexis forceps.  Balanced salt solution was used to hydrodissect and hydrodelineate the nucleus.   Phacoemulsification was then used in stop and chop fashion to remove the lens nucleus and epinucleus.  The remaining cortex was then removed using the irrigation and aspiration handpiece. Healon was then placed into the capsular bag to distend it for lens placement.  A lens  was then injected into the capsular bag.  The remaining viscoelastic was aspirated.   Wounds were hydrated with balanced salt solution.  The anterior chamber was inflated to a physiologic pressure with balanced salt solution.   Intracameral vigamox 0.1 mL undiluted was injected into the eye and a drop placed onto the ocular surface.  No wound leaks were noted.  The patient was taken to the recovery room in stable condition without complications of anesthesia or surgery  Benay Pillow 11/09/2017, 8:48 AM

## 2017-11-09 NOTE — Anesthesia Postprocedure Evaluation (Signed)
Anesthesia Post Note  Patient: Tonya Cabrera  Procedure(s) Performed: CATARACT EXTRACTION PHACO AND INTRAOCULAR LENS PLACEMENT (IOC) RIGHT (Right Eye)  Patient location during evaluation: PACU Anesthesia Type: MAC Level of consciousness: awake and alert Pain management: pain level controlled Vital Signs Assessment: post-procedure vital signs reviewed and stable Respiratory status: spontaneous breathing, nonlabored ventilation, respiratory function stable and patient connected to nasal cannula oxygen Cardiovascular status: blood pressure returned to baseline and stable Postop Assessment: no apparent nausea or vomiting Anesthetic complications: no    SCOURAS, NICOLE ELAINE

## 2017-11-09 NOTE — Anesthesia Preprocedure Evaluation (Signed)
Anesthesia Evaluation  Patient identified by MRN, date of birth, ID band Patient awake    Reviewed: Allergy & Precautions, H&P , NPO status , Patient's Chart, lab work & pertinent test results, reviewed documented beta blocker date and time   Airway Mallampati: II  TM Distance: >3 FB Neck ROM: full    Dental no notable dental hx.    Pulmonary shortness of breath, former smoker,    Pulmonary exam normal breath sounds clear to auscultation       Cardiovascular Exercise Tolerance: Good hypertension, + CAD and + Past MI  negative cardio ROS  + Valvular Problems/Murmurs  Rhythm:regular Rate:Normal     Neuro/Psych CVA negative psych ROS   GI/Hepatic negative GI ROS, Neg liver ROS,   Endo/Other  negative endocrine ROS  Renal/GU Renal disease  negative genitourinary   Musculoskeletal   Abdominal   Peds  Hematology negative hematology ROS (+)   Anesthesia Other Findings   Reproductive/Obstetrics negative OB ROS                             Anesthesia Physical Anesthesia Plan  ASA: III  Anesthesia Plan: MAC   Post-op Pain Management:    Induction:   PONV Risk Score and Plan:   Airway Management Planned:   Additional Equipment:   Intra-op Plan:   Post-operative Plan:   Informed Consent: I have reviewed the patients History and Physical, chart, labs and discussed the procedure including the risks, benefits and alternatives for the proposed anesthesia with the patient or authorized representative who has indicated his/her understanding and acceptance.   Dental Advisory Given  Plan Discussed with: CRNA  Anesthesia Plan Comments:         Anesthesia Quick Evaluation

## 2017-11-12 DIAGNOSIS — H2512 Age-related nuclear cataract, left eye: Secondary | ICD-10-CM | POA: Diagnosis not present

## 2017-11-17 ENCOUNTER — Other Ambulatory Visit: Payer: Self-pay

## 2017-11-17 ENCOUNTER — Encounter: Payer: Self-pay | Admitting: *Deleted

## 2017-11-22 NOTE — Discharge Instructions (Signed)

## 2017-11-23 ENCOUNTER — Encounter
Admission: RE | Disposition: A | Discharge status: Home / Self Care | Payer: Self-pay | Source: Ambulatory Visit | Attending: Ophthalmology

## 2017-11-23 ENCOUNTER — Encounter: Payer: Self-pay | Admitting: Ophthalmology

## 2017-11-23 ENCOUNTER — Ambulatory Visit: Payer: Medicare Other | Admitting: Anesthesiology

## 2017-11-23 ENCOUNTER — Ambulatory Visit
Admission: RE | Admit: 2017-11-23 | Discharge: 2017-11-23 | Disposition: A | Discharge status: Home / Self Care | Payer: Medicare Other | Source: Ambulatory Visit | Attending: Ophthalmology | Admitting: Ophthalmology

## 2017-11-23 DIAGNOSIS — I2510 Atherosclerotic heart disease of native coronary artery without angina pectoris: Secondary | ICD-10-CM | POA: Diagnosis not present

## 2017-11-23 DIAGNOSIS — Z7982 Long term (current) use of aspirin: Secondary | ICD-10-CM | POA: Insufficient documentation

## 2017-11-23 DIAGNOSIS — H2512 Age-related nuclear cataract, left eye: Secondary | ICD-10-CM | POA: Insufficient documentation

## 2017-11-23 DIAGNOSIS — I252 Old myocardial infarction: Secondary | ICD-10-CM | POA: Diagnosis not present

## 2017-11-23 DIAGNOSIS — I739 Peripheral vascular disease, unspecified: Secondary | ICD-10-CM | POA: Insufficient documentation

## 2017-11-23 DIAGNOSIS — Z955 Presence of coronary angioplasty implant and graft: Secondary | ICD-10-CM | POA: Insufficient documentation

## 2017-11-23 DIAGNOSIS — Z87891 Personal history of nicotine dependence: Secondary | ICD-10-CM | POA: Diagnosis not present

## 2017-11-23 DIAGNOSIS — Z79899 Other long term (current) drug therapy: Secondary | ICD-10-CM | POA: Insufficient documentation

## 2017-11-23 DIAGNOSIS — I10 Essential (primary) hypertension: Secondary | ICD-10-CM | POA: Diagnosis not present

## 2017-11-23 DIAGNOSIS — Z8673 Personal history of transient ischemic attack (TIA), and cerebral infarction without residual deficits: Secondary | ICD-10-CM | POA: Insufficient documentation

## 2017-11-23 DIAGNOSIS — E7800 Pure hypercholesterolemia, unspecified: Secondary | ICD-10-CM | POA: Insufficient documentation

## 2017-11-23 HISTORY — PX: CATARACT EXTRACTION W/PHACO: SHX586

## 2017-11-23 SURGERY — PHACOEMULSIFICATION, CATARACT, WITH IOL INSERTION
Anesthesia: Monitor Anesthesia Care | Site: Eye | Laterality: Left | Wound class: Clean

## 2017-11-23 MED ORDER — SODIUM HYALURONATE 10 MG/ML IO SOLN
INTRAOCULAR | Status: DC | PRN
  Administered 2017-11-23: 0.55 mL via INTRAOCULAR

## 2017-11-23 MED ORDER — BALANCED SALT IO SOLN
INTRAOCULAR | Status: DC | PRN
  Administered 2017-11-23: 2 mL via INTRAOCULAR

## 2017-11-23 MED ORDER — ACETAMINOPHEN 325 MG PO TABS: 325.0000 mg | ORAL_TABLET | Freq: Once | ORAL | Status: DC

## 2017-11-23 MED ORDER — SODIUM HYALURONATE 23 MG/ML IO SOLN
INTRAOCULAR | Status: DC | PRN
  Administered 2017-11-23: 0.6 mL via INTRAOCULAR

## 2017-11-23 MED ORDER — BUPIVACAINE HCL (PF) 0.75 % IJ SOLN: INTRAMUSCULAR | Status: DC | PRN

## 2017-11-23 MED ORDER — NA CHONDROIT SULF-NA HYALURON 40-30 MG/ML IO SOLN
INTRAOCULAR | Status: DC | PRN
  Administered 2017-11-23: 0.5 mL via INTRAOCULAR

## 2017-11-23 MED ORDER — MOXIFLOXACIN HCL 0.5 % OP SOLN
OPHTHALMIC | Status: DC | PRN
  Administered 2017-11-23: 0.2 mL via OPHTHALMIC

## 2017-11-23 MED ORDER — ACETAMINOPHEN 160 MG/5ML PO SOLN: 325.0000 mg | Freq: Once | ORAL | Status: DC

## 2017-11-23 MED ORDER — LACTATED RINGERS IV SOLN: INTRAVENOUS | Status: DC

## 2017-11-23 MED ORDER — FENTANYL CITRATE (PF) 100 MCG/2ML IJ SOLN
INTRAMUSCULAR | Status: DC | PRN
  Administered 2017-11-23: 50 ug via INTRAVENOUS

## 2017-11-23 MED ORDER — BRIMONIDINE TARTRATE-TIMOLOL 0.2-0.5 % OP SOLN
OPHTHALMIC | Status: DC | PRN
  Administered 2017-11-23: 1 [drp] via OPHTHALMIC

## 2017-11-23 MED ORDER — MIDAZOLAM HCL 2 MG/2ML IJ SOLN
INTRAMUSCULAR | Status: DC | PRN
  Administered 2017-11-23: 2 mg via INTRAVENOUS

## 2017-11-23 MED ORDER — TRIAMCINOLONE ACETONIDE 40 MG/ML IJ SUSP
INTRAMUSCULAR | Status: DC | PRN
  Administered 2017-11-23: .1 mL

## 2017-11-23 MED ORDER — ARMC OPHTHALMIC DILATING DROPS
1.0000 "application " | OPHTHALMIC | Status: DC | PRN
  Administered 2017-11-23 (×3): 1 via OPHTHALMIC

## 2017-11-23 MED ORDER — BSS IO SOLN
INTRAOCULAR | Status: DC | PRN
  Administered 2017-11-23: 72 mL via OPHTHALMIC

## 2017-11-23 SURGICAL SUPPLY — 17 items
CANNULA ANT/CHMB 27G (MISCELLANEOUS) ×1 IMPLANT
CANNULA ANT/CHMB 27GA (MISCELLANEOUS) ×2 IMPLANT
DISSECTOR HYDRO NUCLEUS 50X22 (MISCELLANEOUS) ×2 IMPLANT
GLOVE BIO SURGEON STRL SZ8 (GLOVE) ×2 IMPLANT
GLOVE SURG LX 7.5 STRW (GLOVE) ×1
GLOVE SURG LX STRL 7.5 STRW (GLOVE) ×1 IMPLANT
GOWN STRL REUS W/ TWL LRG LVL3 (GOWN DISPOSABLE) ×2 IMPLANT
GOWN STRL REUS W/TWL LRG LVL3 (GOWN DISPOSABLE) ×4
LENS IOL TECNIS ITEC 17.0 (Intraocular Lens) ×1 IMPLANT
MARKER SKIN DUAL TIP RULER LAB (MISCELLANEOUS) ×2 IMPLANT
PACK CATARACT (MISCELLANEOUS) ×2 IMPLANT
PACK DR. KING ARMS (PACKS) ×2 IMPLANT
PACK EYE AFTER SURG (MISCELLANEOUS) ×2 IMPLANT
SYR 3ML LL SCALE MARK (SYRINGE) ×2 IMPLANT
SYR TB 1ML LUER SLIP (SYRINGE) ×2 IMPLANT
WATER STERILE IRR 500ML POUR (IV SOLUTION) ×2 IMPLANT
WIPE NON LINTING 3.25X3.25 (MISCELLANEOUS) ×2 IMPLANT

## 2017-11-23 NOTE — H&P (Signed)
The History and Physical notes are on paper, have been signed, and are to be scanned.   I have examined the patient and there are no changes to the H&P.   Tonya Cabrera 11/23/2017 8:03 AM

## 2017-11-23 NOTE — Op Note (Signed)
OPERATIVE NOTE  Tonya Cabrera [id number] 11/23/2017   PREOPERATIVE DIAGNOSIS:  Nuclear sclerotic cataract left eye.  H25.12   POSTOPERATIVE DIAGNOSIS:    Nuclear sclerotic cataract left eye.     PROCEDURE:  Phacoemusification with posterior chamber intraocular lens placement of the left eye   LENS:   Implant Name Type Inv. Item Serial No. Manufacturer Lot No. LRB No. Used  LENS IOL DIOP 17.0 - [id number] Intraocular Lens LENS IOL DIOP 17.0 [id number] AMO  Left 1       PCB00 +17.0   ULTRASOUND TIME: 0 minutes 29.7 seconds.  CDE 3.86   SURGEON:  Benay Pillow, MD, MPH   ANESTHESIA:  Topical with tetracaine drops augmented with 1% preservative-free intracameral lidocaine.  ESTIMATED BLOOD LOSS: <1 mL   COMPLICATIONS:  small posterior capsular tear during posterior capsular polishing, without vitreous prolapse or loss.   DESCRIPTION OF PROCEDURE:  The patient was identified in the holding room and transported to the operating room and placed in the supine position under the operating microscope.  The left eye was identified as the operative eye and it was prepped and draped in the usual sterile ophthalmic fashion.   A 1.0 millimeter clear-corneal paracentesis was made at the 5:00 position. 0.5 ml of preservative-free 1% lidocaine with epinephrine was injected into the anterior chamber.  The anterior chamber was filled with Healon 5 viscoelastic.  A 2.4 millimeter keratome was used to make a near-clear corneal incision at the 2:00 position.  A curvilinear capsulorrhexis was made with a cystotome and capsulorrhexis forceps.  Balanced salt solution was used to hydrodissect and hydrodelineate the nucleus.   Phacoemulsification was then used in stop and chop fashion to remove the lens nucleus and epinucleus.  The remaining cortex was then removed using the irrigation and aspiration handpiece.   During polishing of the posterior capsule, a small rent was created in the posterior capsule  centrally.  The I/A was held in place and viscoat was injected via the paracentesis.  There was no vitreous detected in the anterior chamber or to the wound.  Healon was then placed into the capsular bag to distend it for lens placement.    The wounds were hydrated.  A single piece acrylic lens was then injected into the capsular bag.  The remaining viscoelastic was gently aspirated.  A small amount of kenalog was injected into the anterior chamber to inspect for vitreous, there was none.  The IA was used to further gently aspirate the remaining kenalog and vitreous.     Wounds were hydrated with balanced salt solution.  The anterior chamber was inflated to a physiologic pressure with balanced salt solution.  Intracameral vigamox 0.1 mL undiltued was injected into the eye and a drop placed onto the ocular surface.  Combigan was placed on the eye.    No wound leaks were noted.  The patient was taken to the recovery room in stable condition.    There was no loss of vitreous or lens fragments.  Benay Pillow 11/23/2017, 8:40 AM

## 2017-11-23 NOTE — Transfer of Care (Signed)
Immediate Anesthesia Transfer of Care Note  Patient: Tonya Cabrera  Procedure(s) Performed: CATARACT EXTRACTION PHACO AND INTRAOCULAR LENS PLACEMENT (IOC) LEFT (Left Eye)  Patient Location: PACU  Anesthesia Type: MAC  Level of Consciousness: awake, alert  and patient cooperative  Airway and Oxygen Therapy: Patient Spontanous Breathing and Patient connected to supplemental oxygen  Post-op Assessment: Post-op Vital signs reviewed, Patient's Cardiovascular Status Stable, Respiratory Function Stable, Patent Airway and No signs of Nausea or vomiting  Post-op Vital Signs: Reviewed and stable  Complications: No apparent anesthesia complications

## 2017-11-23 NOTE — Anesthesia Postprocedure Evaluation (Signed)
Anesthesia Post Note  Patient: Tonya Cabrera  Procedure(s) Performed: CATARACT EXTRACTION PHACO AND INTRAOCULAR LENS PLACEMENT (Point) LEFT (Left Eye)  Patient location during evaluation: PACU Anesthesia Type: MAC Level of consciousness: awake and alert and oriented Pain management: satisfactory to patient Vital Signs Assessment: post-procedure vital signs reviewed and stable Respiratory status: spontaneous breathing, nonlabored ventilation and respiratory function stable Cardiovascular status: blood pressure returned to baseline and stable Postop Assessment: Adequate PO intake and No signs of nausea or vomiting Anesthetic complications: no    Raliegh Ip

## 2017-11-23 NOTE — Anesthesia Procedure Notes (Signed)
Procedure Name: MAC Date/Time: 11/23/2017 8:10 AM Performed by: Janna Arch, CRNA Pre-anesthesia Checklist: Patient identified, Emergency Drugs available, Suction available and Patient being monitored Patient Re-evaluated:Patient Re-evaluated prior to induction Oxygen Delivery Method: Nasal cannula

## 2017-11-23 NOTE — Anesthesia Preprocedure Evaluation (Signed)
Anesthesia Evaluation  Patient identified by MRN, date of birth, ID band Patient awake    Reviewed: Allergy & Precautions, H&P , NPO status , Patient's Chart, lab work & pertinent test results, reviewed documented beta blocker date and time   Airway Mallampati: II  TM Distance: >3 FB Neck ROM: full    Dental  (+) Loose   Pulmonary shortness of breath, former smoker,    Pulmonary exam normal breath sounds clear to auscultation       Cardiovascular Exercise Tolerance: Good hypertension, + CAD, + Past MI and + Peripheral Vascular Disease  negative cardio ROS  + Valvular Problems/Murmurs  Rhythm:regular Rate:Normal     Neuro/Psych CVA negative psych ROS   GI/Hepatic negative GI ROS, Neg liver ROS,   Endo/Other  negative endocrine ROS  Renal/GU      Musculoskeletal   Abdominal   Peds  Hematology negative hematology ROS (+)   Anesthesia Other Findings   Reproductive/Obstetrics negative OB ROS                             Anesthesia Physical Anesthesia Plan  ASA: III  Anesthesia Plan: MAC   Post-op Pain Management:    Induction:   PONV Risk Score and Plan: 2 and Treatment may vary due to age or medical condition and Midazolam  Airway Management Planned:   Additional Equipment:   Intra-op Plan:   Post-operative Plan:   Informed Consent:   Plan Discussed with:   Anesthesia Plan Comments:         Anesthesia Quick Evaluation

## 2017-12-20 ENCOUNTER — Ambulatory Visit: Payer: Medicare Other | Admitting: Family Medicine

## 2017-12-23 ENCOUNTER — Ambulatory Visit (INDEPENDENT_AMBULATORY_CARE_PROVIDER_SITE_OTHER): Payer: Medicare Other | Admitting: Family Medicine

## 2017-12-23 ENCOUNTER — Encounter: Payer: Self-pay | Admitting: Family Medicine

## 2017-12-23 VITALS — BP 136/80 | HR 72 | Temp 98.3°F | Ht 63.0 in | Wt 133.3 lb

## 2017-12-23 DIAGNOSIS — M62838 Other muscle spasm: Secondary | ICD-10-CM

## 2017-12-23 DIAGNOSIS — I129 Hypertensive chronic kidney disease with stage 1 through stage 4 chronic kidney disease, or unspecified chronic kidney disease: Secondary | ICD-10-CM | POA: Diagnosis not present

## 2017-12-23 DIAGNOSIS — E782 Mixed hyperlipidemia: Secondary | ICD-10-CM

## 2017-12-23 MED ORDER — LISINOPRIL 10 MG PO TABS: 10.0000 mg | ORAL_TABLET | Freq: Every day | ORAL | 1 refills | Status: DC

## 2017-12-23 MED ORDER — CLOPIDOGREL BISULFATE 75 MG PO TABS: 75.0000 mg | ORAL_TABLET | Freq: Every day | ORAL | 1 refills | Status: DC

## 2017-12-23 MED ORDER — CYCLOBENZAPRINE HCL 10 MG PO TABS: 10.0000 mg | ORAL_TABLET | Freq: Every day | ORAL | 1 refills | Status: DC

## 2017-12-23 MED ORDER — ATORVASTATIN CALCIUM 40 MG PO TABS: 40.0000 mg | ORAL_TABLET | Freq: Every day | ORAL | 1 refills | Status: DC

## 2017-12-23 MED ORDER — ASPIRIN 81 MG PO TBEC: DELAYED_RELEASE_TABLET | ORAL | 4 refills | Status: DC

## 2017-12-23 MED ORDER — METOPROLOL SUCCINATE ER 25 MG PO TB24: 25.0000 mg | ORAL_TABLET | Freq: Two times a day (BID) | ORAL | 1 refills | Status: DC

## 2017-12-23 MED ORDER — NAPROXEN 500 MG PO TABS: 500.0000 mg | ORAL_TABLET | Freq: Two times a day (BID) | ORAL | 1 refills | Status: DC

## 2017-12-23 NOTE — Progress Notes (Signed)
BP 136/80   Pulse 72   Temp 98.3 F (36.8 C) (Oral)   Ht 5\' 3"  (1.6 m)   Wt 133 lb 4.8 oz (60.5 kg)   SpO2 100%   BMI 23.61 kg/m    Subjective:    Patient ID: Tonya Cabrera, female    DOB: [birth year], 67 y.o.   MRN: [id number]  HPI: Tonya Cabrera is a 67 y.o. female  Chief Complaint  Patient presents with  . Hyperlipidemia  . Hypertension   HYPERTENSION / [employer] Satisfied with current treatment? yes Duration of hypertension: chronic BP monitoring frequency: not checking BP medication side effects: no Past BP meds: lisinopril, metoprolol Duration of hyperlipidemia: chronic Cholesterol medication side effects: no Cholesterol supplements: fish oil Past cholesterol medications: atorvastain (lipitor) Medication compliance: excellent compliance Aspirin: yes Recent stressors: yes Recurrent headaches: no Visual changes: no Palpitations: no Dyspnea: no Chest pain: no Lower extremity edema: no Dizzy/lightheaded: no  Has been having pain from the back of her L neck down to her leg to her knee. 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 Pain is pretty constant. Seems to ease up and worsen. Helped with heating pad and salonpas pads. Hasn't tried any NSAIDs or tylenol. Has been moving her mobile home. Has done PT which helped while she was there, but then stopped when she stopped. She has not been doing her exercises from PT. She has been having problems with water running out the outside of her house. She is hauling buckets of water and slinging them on a daily bases.   Relevant past medical, surgical, family and social history reviewed and updated as indicated. Interim medical history since our last visit reviewed. Allergies and medications reviewed and updated.  Review of Systems  Constitutional: Negative.   Respiratory: Negative.   Cardiovascular: Negative.   Musculoskeletal: Positive for back pain, myalgias and neck pain. Negative  for arthralgias, gait problem, joint swelling and neck stiffness.  Skin: Negative.   Neurological: Negative.   Psychiatric/Behavioral: Negative.     Per HPI unless specifically indicated above     Objective:    BP 136/80   Pulse 72   Temp 98.3 F (36.8 C) (Oral)   Ht 5\' 3"  (1.6 m)   Wt 133 lb 4.8 oz (60.5 kg)   SpO2 100%   BMI 23.61 kg/m   Wt Readings from Last 3 Encounters:  12/23/17 133 lb 4.8 oz (60.5 kg)  11/23/17 129 lb (58.5 kg)  11/09/17 130 lb (59 kg)    Physical Exam  Constitutional: She is oriented to person, place, and time. She appears well-developed and well-nourished. No distress.  HENT:  Head: Normocephalic and atraumatic.  Right Ear: Hearing normal.  Left Ear: Hearing normal.  Nose: Nose normal.  Eyes: Conjunctivae and lids are normal. Right eye exhibits no discharge. Left eye exhibits no discharge. No scleral icterus.  Cardiovascular: Normal rate, regular rhythm, normal heart sounds and intact distal pulses. Exam reveals no gallop and no friction rub.  No murmur heard. Pulmonary/Chest: Effort normal and breath sounds normal. No respiratory distress. She has no wheezes. She has no rales. She exhibits no tenderness.  Musculoskeletal: Normal range of motion.  Neurological: She is alert and oriented to person, place, and time.  Skin: Skin is warm, dry and intact. No rash noted. She is not diaphoretic. No erythema. No pallor.  Psychiatric: She has a normal mood and affect. Her speech is normal and behavior is normal. Judgment and  thought content normal. Cognition and memory are normal.    Results for orders placed or performed in visit on 06/22/17  Microscopic Examination  Result Value Ref Range   WBC, UA 0-5 0 - 5 /hpf   RBC, UA 3-5 (A) 0 - 2 /hpf   Epithelial Cells (non renal) CANCELED    Bacteria, UA Moderate (A) None seen/Few   Yeast, UA Present (A) None seen  Urine Culture, Reflex  Result Value Ref Range   Urine Culture, Routine Final report (A)      Organism ID, Bacteria Klebsiella pneumoniae (A)    Antimicrobial Susceptibility Comment   CBC with Differential/Platelet  Result Value Ref Range   WBC 7.5 3.4 - 10.8 x10E3/uL   RBC 3.91 3.77 - 5.28 x10E6/uL   Hemoglobin 12.1 11.1 - 15.9 g/dL   Hematocrit 35.5 34.0 - 46.6 %   MCV 91 79 - 97 fL   MCH 30.9 26.6 - 33.0 pg   MCHC 34.1 31.5 - 35.7 g/dL   RDW 13.1 12.3 - 15.4 %   Platelets 220 150 - 379 x10E3/uL   Neutrophils 64 Not Estab. %   Lymphs 26 Not Estab. %   Monocytes 8 Not Estab. %   Eos 2 Not Estab. %   Basos 0 Not Estab. %   Neutrophils Absolute 4.8 1.4 - 7.0 x10E3/uL   Lymphocytes Absolute 2.0 0.7 - 3.1 x10E3/uL   Monocytes Absolute 0.6 0.1 - 0.9 x10E3/uL   EOS (ABSOLUTE) 0.1 0.0 - 0.4 x10E3/uL   Basophils Absolute 0.0 0.0 - 0.2 x10E3/uL   Immature Granulocytes 0 Not Estab. %   Immature Grans (Abs) 0.0 0.0 - 0.1 x10E3/uL  Comprehensive metabolic panel  Result Value Ref Range   Glucose 76 65 - 99 mg/dL   BUN 15 8 - 27 mg/dL   Creatinine, Ser 0.89 0.57 - 1.00 mg/dL   GFR calc non Af Amer 68 >59 mL/min/1.73   GFR calc Af Amer 78 >59 mL/min/1.73   BUN/Creatinine Ratio 17 12 - 28   Sodium 146 (H) 134 - 144 mmol/L   Potassium 3.8 3.5 - 5.2 mmol/L   Chloride 108 (H) 96 - 106 mmol/L   CO2 22 20 - 29 mmol/L   Calcium 9.4 8.7 - 10.3 mg/dL   Total Protein 6.6 6.0 - 8.5 g/dL   Albumin 4.6 3.6 - 4.8 g/dL   Globulin, Total 2.0 1.5 - 4.5 g/dL   Albumin/Globulin Ratio 2.3 (H) 1.2 - 2.2   Bilirubin Total 1.2 0.0 - 1.2 mg/dL   Alkaline Phosphatase 80 39 - 117 IU/L   AST 16 0 - 40 IU/L   ALT 18 0 - 32 IU/L  Lipid Panel w/o Chol/HDL Ratio  Result Value Ref Range   Cholesterol, Total 165 100 - 199 mg/dL   Triglycerides 119 0 - 149 mg/dL   HDL 51 >39 mg/dL   VLDL Cholesterol Cal 24 5 - 40 mg/dL   LDL Calculated 90 0 - 99 mg/dL  Microalbumin, Urine Waived  Result Value Ref Range   Microalb, Ur Waived 10 0 - 19 mg/L   Creatinine, Urine Waived 100 10 - 300 mg/dL    Microalb/Creat Ratio <30 <30 mg/g  TSH  Result Value Ref Range   TSH 3.030 0.450 - 4.500 uIU/mL  UA/M w/rflx Culture, Routine  Result Value Ref Range   Specific Gravity, UA 1.010 1.005 - 1.030   pH, UA 5.5 5.0 - 7.5   Color, UA Yellow Yellow   Appearance Ur  Cloudy (A) Clear   Leukocytes, UA 1+ (A) Negative   Protein, UA Negative Negative/Trace   Glucose, UA Negative Negative   Ketones, UA Negative Negative   RBC, UA 1+ (A) Negative   Bilirubin, UA Negative Negative   Urobilinogen, Ur 0.2 0.2 - 1.0 mg/dL   Nitrite, UA Negative Negative   Microscopic Examination See below:    Urinalysis Reflex Comment       Assessment & Plan:   Problem List Items Addressed This Visit      Genitourinary   Benign hypertensive renal disease - Primary    Under good control. Continue to monitor. Call with any concerns. Refills given today.      Relevant Orders   Comprehensive metabolic panel     Other   Hyperlipidemia    Under good control. Continue to monitor. Call with any concerns. Refills given today.      Relevant Medications   lisinopril (PRINIVIL,ZESTRIL) 10 MG tablet   metoprolol succinate (TOPROL-XL) 25 MG 24 hr tablet   atorvastatin (LIPITOR) 40 MG tablet   aspirin 81 MG EC tablet   Other Relevant Orders   Comprehensive metabolic panel   Lipid Panel w/o Chol/HDL Ratio    Other Visit Diagnoses    Muscle spasm       Will try flexeril and naproxen. Call with any concerns. Continue to monitor. Stretches.        Follow up plan: Return in about 6 months (around 06/25/2018) for Physical.

## 2017-12-23 NOTE — Assessment & Plan Note (Signed)
Under good control. Continue to monitor. Call with any concerns. Refills given today. 

## 2017-12-24 ENCOUNTER — Encounter: Payer: Self-pay | Admitting: Family Medicine

## 2017-12-24 LAB — COMPREHENSIVE METABOLIC PANEL
ALBUMIN: 4.6 g/dL (ref 3.6–4.8)
ALT: 30 IU/L (ref 0–32)
AST: 22 IU/L (ref 0–40)
Albumin/Globulin Ratio: 1.9 (ref 1.2–2.2)
Alkaline Phosphatase: 83 IU/L (ref 39–117)
BILIRUBIN TOTAL: 1.9 mg/dL — AB (ref 0.0–1.2)
BUN / CREAT RATIO: 16 (ref 12–28)
BUN: 14 mg/dL (ref 8–27)
CALCIUM: 9.5 mg/dL (ref 8.7–10.3)
CHLORIDE: 106 mmol/L (ref 96–106)
CO2: 21 mmol/L (ref 20–29)
Creatinine, Ser: 0.9 mg/dL (ref 0.57–1.00)
GFR, EST AFRICAN AMERICAN: 77 mL/min/{1.73_m2} (ref >59)
GFR, EST NON AFRICAN AMERICAN: 67 mL/min/{1.73_m2} (ref >59)
GLUCOSE: 78 mg/dL (ref 65–99)
Globulin, Total: 2.4 g/dL (ref 1.5–4.5)
Potassium: 4 mmol/L (ref 3.5–5.2)
Sodium: 142 mmol/L (ref 134–144)
TOTAL PROTEIN: 7 g/dL (ref 6.0–8.5)

## 2017-12-24 LAB — LIPID PANEL W/O CHOL/HDL RATIO
Cholesterol, Total: 190 mg/dL (ref 100–199)
HDL: 60 mg/dL (ref >39)
LDL CALC: 100 mg/dL — AB (ref 0–99)
Triglycerides: 152 mg/dL — ABNORMAL HIGH (ref 0–149)
VLDL CHOLESTEROL CAL: 30 mg/dL (ref 5–40)

## 2018-01-07 ENCOUNTER — Telehealth: Payer: Self-pay | Admitting: Family Medicine

## 2018-01-07 DIAGNOSIS — M25512 Pain in left shoulder: Secondary | ICD-10-CM

## 2018-01-07 NOTE — Telephone Encounter (Signed)
Called to check in on Lovada's pain. She states that her back is better, but her shoulder is no better on the naproxen and flexeril. Will obain x-ray. Order placed today. Await results.

## 2018-01-07 NOTE — Telephone Encounter (Signed)
-----   Message from Valerie Roys, DO sent at 12/23/2017  2:12 PM EDT ----- Call about her pain

## 2018-01-10 ENCOUNTER — Encounter: Payer: Self-pay | Admitting: Family Medicine

## 2018-01-10 ENCOUNTER — Ambulatory Visit
Admission: RE | Admit: 2018-01-10 | Discharge: 2018-01-10 | Disposition: A | Discharge status: Home / Self Care | Payer: Medicare Other | Source: Ambulatory Visit | Attending: Family Medicine | Admitting: Family Medicine

## 2018-01-10 DIAGNOSIS — M25512 Pain in left shoulder: Secondary | ICD-10-CM | POA: Diagnosis not present

## 2018-01-31 ENCOUNTER — Ambulatory Visit (INDEPENDENT_AMBULATORY_CARE_PROVIDER_SITE_OTHER): Payer: Medicare Other | Admitting: Internal Medicine

## 2018-01-31 ENCOUNTER — Encounter: Payer: Self-pay | Admitting: Internal Medicine

## 2018-01-31 VITALS — BP 112/62 | HR 80 | Resp 16 | Ht 63.0 in | Wt 133.0 lb

## 2018-01-31 DIAGNOSIS — F411 Generalized anxiety disorder: Secondary | ICD-10-CM

## 2018-01-31 DIAGNOSIS — F99 Mental disorder, not otherwise specified: Secondary | ICD-10-CM

## 2018-01-31 DIAGNOSIS — G4719 Other hypersomnia: Secondary | ICD-10-CM | POA: Diagnosis not present

## 2018-01-31 DIAGNOSIS — F5105 Insomnia due to other mental disorder: Secondary | ICD-10-CM

## 2018-01-31 NOTE — Patient Instructions (Signed)
Continue ambien

## 2018-01-31 NOTE — Progress Notes (Signed)
[facility]       Assessment and Plan:  Excessive daytime sleepiness.  --Suspect that this is secondary to insomnia.  Insomnia.  --Sleep initiation and sleep maintenance type, responding well to Ambien 5 mg qhs, will continue.   Anxiety. --Continues to contribute to insomnia.  Snoring.  --Continue to snore, sleep study negative.    Date: 01/31/2018  MRN# [id number] ZALIAH WISSNER [birth year]    [name] is a 67 y.o. old female seen in consultation for chief complaint of:    Chief Complaint  Patient presents with  . Insomnia    pt here for f/u. Sleep study negative.    HPI:   The patient is a 67 yo female with a history of CAD, HTN. At last visit she was noted to have symptoms of excessive daytime sleepiness, she was referred for a sleep study which was negative for OSA. She was also noted to have sleep maintenance insomnia, and was started on ambien 5 mg qhs.  She remains on [place] and she is able to fall asleep, currently getting 5-6 hours per night.   She continues to have significant anxiety.   She smoked about 1 ppd until her heart attack in 2015. She is undergoing CT lung cancer screening.   Sleep study 11/04/16 on: AHI equals 3. Very poor sleep efficiency consistent with insomnia.  Medication:   Reviewed.  Includes Flexeril, metoprolol   Allergies:  Patient has no known allergies.  Review of Systems: Gen:  Denies  fever, sweats, chills HEENT: Denies blurred vision, double vision. bleeds, sore throat Cvc:  No dizziness, chest pain. Resp:   Denies cough or sputum production, shortness of breath Gi: Denies swallowing difficulty, stomach pain. Gu:  Denies bladder incontinence, burning urine Ext:   No Joint pain, stiffness. Skin: No skin rash,  hives  Endoc:  No polyuria, polydipsia. Psych: No depression, insomnia. Other:  All other systems were reviewed with the patient and were negative other that what is mentioned in the HPI.    Physical Examination:   VS: BP 112/62 (BP Location: Left Arm, Cuff Size: Normal)   Pulse 80   Resp 16   Ht 5\' 3"  (1.6 m)   Wt 133 lb (60.3 kg)   SpO2 100%   BMI 23.56 kg/m   General Appearance: No distress  Neuro:without focal findings,  speech normal,  HEENT: PERRLA, EOM intact.   Pulmonary: normal breath sounds, No wheezing.  CardiovascularNormal S1,S2.  No m/r/g.   Abdomen: Benign, Soft, non-tender. Renal:  No costovertebral tenderness  GU:  No performed at this time. Endoc: No evident thyromegaly, no signs of acromegaly. Skin:   warm, no rashes, no ecchymosis  Extremities: normal, no cyanosis, clubbing.  Other findings:    LABORATORY PANEL:   CBC No results for input(s): WBC, HGB, HCT, PLT in the last 168 hours. ------------------------------------------------------------------------------------------------------------------  Chemistries  No results for input(s): NA, K, CL, CO2, GLUCOSE, BUN, CREATININE, CALCIUM, MG, AST, ALT, ALKPHOS, BILITOT in the last 168 hours.  Invalid input(s): GFRCGP ------------------------------------------------------------------------------------------------------------------  Cardiac Enzymes No results for input(s): TROPONINI in the last 168 hours. ------------------------------------------------------------  RADIOLOGY:  No results found.     Thank  you for the consultation and for allowing [facility] to assist in the care of your patient. Our recommendations are noted above.  Please contact us if we can be of further service.   [name], MD.  Board Certified in Internal Medicine, Pulmonary Medicine, Critical  Care Medicine, and Sleep Medicine.  [facility] Office Number: [phone]  [name], M.D.  [name], M.D  01/31/2018

## 2018-02-04 ENCOUNTER — Other Ambulatory Visit: Payer: Self-pay | Admitting: Internal Medicine

## 2018-02-19 ENCOUNTER — Inpatient Hospital Stay
Admission: EM | Admit: 2018-02-19 | Discharge: 2018-02-23 | DRG: 357 | Disposition: A | Discharge status: Home / Self Care | Payer: Medicare Other | Attending: Internal Medicine | Admitting: Internal Medicine

## 2018-02-19 ENCOUNTER — Emergency Department: Payer: Medicare Other

## 2018-02-19 ENCOUNTER — Other Ambulatory Visit: Payer: Self-pay

## 2018-02-19 ENCOUNTER — Encounter: Payer: Self-pay | Admitting: Emergency Medicine

## 2018-02-19 DIAGNOSIS — Z79899 Other long term (current) drug therapy: Secondary | ICD-10-CM

## 2018-02-19 DIAGNOSIS — K921 Melena: Secondary | ICD-10-CM | POA: Diagnosis not present

## 2018-02-19 DIAGNOSIS — Z955 Presence of coronary angioplasty implant and graft: Secondary | ICD-10-CM

## 2018-02-19 DIAGNOSIS — Z87891 Personal history of nicotine dependence: Secondary | ICD-10-CM | POA: Diagnosis not present

## 2018-02-19 DIAGNOSIS — D62 Acute posthemorrhagic anemia: Secondary | ICD-10-CM

## 2018-02-19 DIAGNOSIS — K922 Gastrointestinal hemorrhage, unspecified: Secondary | ICD-10-CM | POA: Diagnosis present

## 2018-02-19 DIAGNOSIS — M810 Age-related osteoporosis without current pathological fracture: Secondary | ICD-10-CM | POA: Diagnosis present

## 2018-02-19 DIAGNOSIS — H9190 Unspecified hearing loss, unspecified ear: Secondary | ICD-10-CM | POA: Diagnosis not present

## 2018-02-19 DIAGNOSIS — Z8673 Personal history of transient ischemic attack (TIA), and cerebral infarction without residual deficits: Secondary | ICD-10-CM

## 2018-02-19 DIAGNOSIS — K254 Chronic or unspecified gastric ulcer with hemorrhage: Secondary | ICD-10-CM | POA: Diagnosis not present

## 2018-02-19 DIAGNOSIS — Z7982 Long term (current) use of aspirin: Secondary | ICD-10-CM

## 2018-02-19 DIAGNOSIS — Z7902 Long term (current) use of antithrombotics/antiplatelets: Secondary | ICD-10-CM

## 2018-02-19 DIAGNOSIS — I10 Essential (primary) hypertension: Secondary | ICD-10-CM | POA: Diagnosis present

## 2018-02-19 DIAGNOSIS — E7800 Pure hypercholesterolemia, unspecified: Secondary | ICD-10-CM | POA: Diagnosis not present

## 2018-02-19 DIAGNOSIS — I2510 Atherosclerotic heart disease of native coronary artery without angina pectoris: Secondary | ICD-10-CM | POA: Diagnosis present

## 2018-02-19 DIAGNOSIS — Z791 Long term (current) use of non-steroidal anti-inflammatories (NSAID): Secondary | ICD-10-CM | POA: Diagnosis not present

## 2018-02-19 DIAGNOSIS — K253 Acute gastric ulcer without hemorrhage or perforation: Secondary | ICD-10-CM | POA: Diagnosis not present

## 2018-02-19 DIAGNOSIS — R0602 Shortness of breath: Secondary | ICD-10-CM | POA: Diagnosis not present

## 2018-02-19 DIAGNOSIS — D649 Anemia, unspecified: Secondary | ICD-10-CM

## 2018-02-19 DIAGNOSIS — E785 Hyperlipidemia, unspecified: Secondary | ICD-10-CM | POA: Diagnosis not present

## 2018-02-19 DIAGNOSIS — I252 Old myocardial infarction: Secondary | ICD-10-CM | POA: Diagnosis not present

## 2018-02-19 DIAGNOSIS — R109 Unspecified abdominal pain: Secondary | ICD-10-CM

## 2018-02-19 DIAGNOSIS — I739 Peripheral vascular disease, unspecified: Secondary | ICD-10-CM | POA: Diagnosis not present

## 2018-02-19 LAB — COMPREHENSIVE METABOLIC PANEL
ALK PHOS: 59 U/L (ref 38–126)
ALT: 15 U/L (ref 14–54)
ANION GAP: 7 (ref 5–15)
AST: 19 U/L (ref 15–41)
Albumin: 3.9 g/dL (ref 3.5–5.0)
BILIRUBIN TOTAL: 2 mg/dL — AB (ref 0.3–1.2)
BUN: 71 mg/dL — ABNORMAL HIGH (ref 6–20)
CALCIUM: 9.4 mg/dL (ref 8.9–10.3)
CO2: 19 mmol/L — ABNORMAL LOW (ref 22–32)
Chloride: 114 mmol/L — ABNORMAL HIGH (ref 101–111)
Creatinine, Ser: 1.06 mg/dL — ABNORMAL HIGH (ref 0.44–1.00)
GFR calc Af Amer: >60 mL/min (ref >60)
GFR calc non Af Amer: 53 mL/min — ABNORMAL LOW (ref >60)
GLUCOSE: 141 mg/dL — AB (ref 65–99)
Potassium: 4.6 mmol/L (ref 3.5–5.1)
Sodium: 140 mmol/L (ref 135–145)
TOTAL PROTEIN: 6.8 g/dL (ref 6.5–8.1)

## 2018-02-19 LAB — URINALYSIS, COMPLETE (UACMP) WITH MICROSCOPIC
Bilirubin Urine: NEGATIVE
GLUCOSE, UA: NEGATIVE mg/dL
Ketones, ur: NEGATIVE mg/dL
NITRITE: NEGATIVE
PH: 5 (ref 5.0–8.0)
PROTEIN: NEGATIVE mg/dL
Specific Gravity, Urine: 1.02 (ref 1.005–1.030)

## 2018-02-19 LAB — CBC
HCT: 28.6 % — ABNORMAL LOW (ref 35.0–47.0)
Hemoglobin: 9.9 g/dL — ABNORMAL LOW (ref 12.0–16.0)
MCH: 31.4 pg (ref 26.0–34.0)
MCHC: 34.5 g/dL (ref 32.0–36.0)
MCV: 91.2 fL (ref 80.0–100.0)
Platelets: 311 10*3/uL (ref 150–440)
RBC: 3.14 MIL/uL — ABNORMAL LOW (ref 3.80–5.20)
RDW: 13.7 % (ref 11.5–14.5)
WBC: 17.2 10*3/uL — ABNORMAL HIGH (ref 3.6–11.0)

## 2018-02-19 LAB — PROTIME-INR
INR: 1.07
Prothrombin Time: 13.8 seconds (ref 11.4–15.2)

## 2018-02-19 LAB — LIPASE, BLOOD: Lipase: 47 U/L (ref 11–51)

## 2018-02-19 LAB — HEMOGLOBIN: Hemoglobin: 8.5 g/dL — ABNORMAL LOW (ref 12.0–16.0)

## 2018-02-19 MED ORDER — ATORVASTATIN CALCIUM 20 MG PO TABS
40.0000 mg | ORAL_TABLET | Freq: Every day | ORAL | Status: DC
  Administered 2018-02-20 – 2018-02-23 (×4): 40 mg via ORAL
  Filled 2018-02-19 (×4): qty 2

## 2018-02-19 MED ORDER — PANTOPRAZOLE SODIUM 40 MG IV SOLR
40.0000 mg | Freq: Two times a day (BID) | INTRAVENOUS | Status: DC
  Administered 2018-02-22 – 2018-02-23 (×2): 40 mg via INTRAVENOUS
  Filled 2018-02-19 (×2): qty 40

## 2018-02-19 MED ORDER — SODIUM CHLORIDE 0.9 % IV SOLN
INTRAVENOUS | Status: DC
  Administered 2018-02-19 – 2018-02-22 (×6): via INTRAVENOUS

## 2018-02-19 MED ORDER — SODIUM CHLORIDE 0.9 % IV SOLN
Freq: Once | INTRAVENOUS | Status: AC
  Administered 2018-02-19: 10:00:00 via INTRAVENOUS

## 2018-02-19 MED ORDER — BISACODYL 10 MG RE SUPP: 10.0000 mg | Freq: Every day | RECTAL | Status: DC | PRN

## 2018-02-19 MED ORDER — IPRATROPIUM-ALBUTEROL 0.5-2.5 (3) MG/3ML IN SOLN
3.0000 mL | Freq: Four times a day (QID) | RESPIRATORY_TRACT | Status: DC
  Administered 2018-02-19 – 2018-02-20 (×4): 3 mL via RESPIRATORY_TRACT
  Filled 2018-02-19 (×4): qty 3

## 2018-02-19 MED ORDER — ONDANSETRON HCL 4 MG PO TABS: 4.0000 mg | ORAL_TABLET | Freq: Four times a day (QID) | ORAL | Status: DC | PRN

## 2018-02-19 MED ORDER — PANTOPRAZOLE SODIUM 40 MG IV SOLR
40.0000 mg | Freq: Once | INTRAVENOUS | Status: AC
  Administered 2018-02-19: 40 mg via INTRAVENOUS
  Filled 2018-02-19: qty 40

## 2018-02-19 MED ORDER — PIPERACILLIN-TAZOBACTAM 3.375 G IVPB
3.3750 g | Freq: Three times a day (TID) | INTRAVENOUS | Status: DC
  Administered 2018-02-19 – 2018-02-20 (×3): 3.375 g via INTRAVENOUS
  Filled 2018-02-19 (×2): qty 50

## 2018-02-19 MED ORDER — ZOLPIDEM TARTRATE 5 MG PO TABS
5.0000 mg | ORAL_TABLET | Freq: Every evening | ORAL | Status: DC | PRN
  Administered 2018-02-19 – 2018-02-22 (×2): 5 mg via ORAL
  Filled 2018-02-19 (×2): qty 1

## 2018-02-19 MED ORDER — LISINOPRIL 10 MG PO TABS
10.0000 mg | ORAL_TABLET | Freq: Every day | ORAL | Status: DC
  Administered 2018-02-22 – 2018-02-23 (×2): 10 mg via ORAL
  Filled 2018-02-19 (×4): qty 1

## 2018-02-19 MED ORDER — METOPROLOL SUCCINATE ER 25 MG PO TB24
25.0000 mg | ORAL_TABLET | Freq: Two times a day (BID) | ORAL | Status: DC
  Administered 2018-02-19 – 2018-02-23 (×7): 25 mg via ORAL
  Filled 2018-02-19 (×8): qty 1

## 2018-02-19 MED ORDER — PANTOPRAZOLE SODIUM 40 MG IV SOLR
INTRAVENOUS | Status: AC
  Administered 2018-02-19: 40 mg via INTRAVENOUS
  Filled 2018-02-19: qty 40

## 2018-02-19 MED ORDER — DOCUSATE SODIUM 100 MG PO CAPS
100.0000 mg | ORAL_CAPSULE | Freq: Two times a day (BID) | ORAL | Status: DC
  Administered 2018-02-19 – 2018-02-23 (×5): 100 mg via ORAL
  Filled 2018-02-19 (×7): qty 1

## 2018-02-19 MED ORDER — ACETAMINOPHEN 325 MG PO TABS
650.0000 mg | ORAL_TABLET | Freq: Four times a day (QID) | ORAL | Status: DC | PRN
  Administered 2018-02-22 (×2): 650 mg via ORAL
  Filled 2018-02-19 (×2): qty 2

## 2018-02-19 MED ORDER — SODIUM CHLORIDE 0.9 % IV SOLN
80.0000 mg | Freq: Once | INTRAVENOUS | Status: DC
  Filled 2018-02-19: qty 80

## 2018-02-19 MED ORDER — ACETAMINOPHEN 650 MG RE SUPP: 650.0000 mg | Freq: Four times a day (QID) | RECTAL | Status: DC | PRN

## 2018-02-19 MED ORDER — PANTOPRAZOLE SODIUM 40 MG IV SOLR
40.0000 mg | Freq: Two times a day (BID) | INTRAVENOUS | Status: DC
  Administered 2018-02-19: 40 mg via INTRAVENOUS

## 2018-02-19 MED ORDER — ONDANSETRON HCL 4 MG/2ML IJ SOLN: 4.0000 mg | Freq: Four times a day (QID) | INTRAMUSCULAR | Status: DC | PRN

## 2018-02-19 MED ORDER — CYCLOBENZAPRINE HCL 10 MG PO TABS
10.0000 mg | ORAL_TABLET | Freq: Every day | ORAL | Status: DC
  Administered 2018-02-19: 10 mg via ORAL
  Filled 2018-02-19: qty 1

## 2018-02-19 MED ORDER — SODIUM CHLORIDE 0.9 % IV SOLN
8.0000 mg/h | INTRAVENOUS | Status: AC
  Administered 2018-02-19 – 2018-02-22 (×6): 8 mg/h via INTRAVENOUS
  Filled 2018-02-19 (×7): qty 80

## 2018-02-19 NOTE — ED Notes (Signed)
Admitting at bedside 

## 2018-02-19 NOTE — ED Notes (Signed)
Pt states hasn't been feeling good x few weeks, multiple complaints. 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 States pulled a tick off her that was super small. States it was in skin x 2-3 days. States congestion too. States "i've been in the sun a lot more than I should be for my age." "I knew it wasn't a heart attack but it's so weird, I can get up and move around but the more I move the more I get short of breath." denies CP. States abd pain (points upper mid abd). Unsure if related to eating. Denies GERD and ulcers. Diarrhea this AM x 4. "it looked like black tar." states when she bends over she feels like she is going to pass out.   Alert, oriented, no distress noted. Talking in complete sentences.

## 2018-02-19 NOTE — ED Notes (Signed)
Fecal occult positive.

## 2018-02-19 NOTE — ED Provider Notes (Signed)
[facility] Emergency Department Provider Note       Time seen: ----------------------------------------- 9:17 AM on 02/19/2018 -----------------------------------------   I have reviewed the triage vital signs and the nursing notes.  HISTORY   Chief Complaint Shortness of Breath; Dizziness; and Diarrhea    HPI Tonya Cabrera is a 67 y.o. female with a history of arthritis, coronary disease, hyperlipidemia, hypertension, MI who presents to the ED for general ill feeling for the past 2 weeks.  Patient states she has been under a lot of stress and has been out in the heat recently.  She reports feeling somewhat short of breath and lightheaded.  Patient also reports some black tarry diarrhea today.  She denies fevers, chills or other complaints.  Past Medical History:  Diagnosis Date  . Arthritis   . Coronary artery disease    coronary stent  . Dyspnea   . Hard of hearing   . Heart murmur   . Hypercholesteremia   . Hypertension   . Myocardial infarction ([place])    2015  . Osteoporosis   . Stroke [facility])    10-12 years ago    Patient Active Problem List   Diagnosis Date Noted  . Personal history of tobacco use, presenting hazards to health 07/11/2016  . Aortic atherosclerosis ([place]) 07/09/2016  . Osteoporosis 07/07/2016  . Myopia of both eyes 06/16/2016  . Hyperlipidemia 06/08/2016  . CAD (coronary artery disease) 06/08/2016  . Snoring 06/08/2016  . Benign hypertensive renal disease 11/14/2015    Past Surgical History:  Procedure Laterality Date  . CAROTID STENT    . CATARACT EXTRACTION W/PHACO Right 11/09/2017   Procedure: CATARACT EXTRACTION PHACO AND INTRAOCULAR LENS PLACEMENT ([place]) RIGHT;  Surgeon: Eulogio Bear, MD;  Location: [place];  Service: Ophthalmology;  Laterality: Right;  . CATARACT EXTRACTION W/PHACO Left 11/23/2017   Procedure: CATARACT EXTRACTION PHACO AND INTRAOCULAR LENS PLACEMENT ([place]) LEFT;  Surgeon: Eulogio Bear, MD;  Location: [place];  Service: Ophthalmology;  Laterality: Left;  . INCONTINENCE SURGERY    . THYROID SURGERY      Allergies Patient has no known allergies.  Social History Social History   Tobacco Use  . Smoking status: Former Smoker    Packs/day: 1.50    Years: 22.00    Pack years: 33.00    Last attempt to quit: 05/08/2014    Years since quitting: 3.7  . Smokeless tobacco: Never Used  . Tobacco comment: Smoked over 25 years, quit after Heart Attack in 2015.    Substance Use Topics  . Alcohol use: No    Alcohol/week: 0.0 oz  . Drug use: No   Review of Systems Constitutional: Negative for fever. Cardiovascular: Negative for chest pain. Respiratory: Negative for shortness of breath. Gastrointestinal: Negative for abdominal pain, positive for diarrhea, positive for tarry stools Genitourinary: Negative for dysuria. Musculoskeletal: Negative for back pain. Skin: Negative for rash. Neurological: Negative for headaches, focal weakness or numbness. Psychiatric: Positive for anxiety depression  All systems negative/normal/unremarkable except as stated in the HPI  ____________________________________________   PHYSICAL EXAM:  VITAL SIGNS: ED Triage Vitals  Enc Vitals Group     BP 02/19/18 0846 108/77     Pulse Rate 02/19/18 0846 92     Resp 02/19/18 0846 18     Temp 02/19/18 0846 (!) 97.5 F (36.4 C)     Temp Source 02/19/18 0846 Oral     SpO2 02/19/18 0846 100 %  Weight 02/19/18 0847 131 lb (59.4 kg)     Height 02/19/18 0847 5\' 3"  (1.6 m)     Head Circumference --      Peak Flow --      Pain Score 02/19/18 0847 7     Pain Loc --      Pain Edu? --      Excl. in [place]? --    Constitutional: Alert and oriented.  Anxious, no distress Eyes: Conjunctivae are normal. Normal extraocular movements. ENT   Head: Normocephalic and atraumatic.   Nose: No congestion/rhinnorhea.   Mouth/Throat: Mucous membranes are moist.   Neck: No  stridor. Cardiovascular: Normal rate, regular rhythm. No murmurs, rubs, or gallops. Respiratory: Normal respiratory effort without tachypnea nor retractions. Breath sounds are clear and equal bilaterally. No wheezes/rales/rhonchi. Gastrointestinal: Soft and nontender. Normal bowel sounds Rectal: Nontender, black and tarry, heme positive Musculoskeletal: Nontender with normal range of motion in extremities. No lower extremity tenderness nor edema. Neurologic:  Normal speech and language. No gross focal neurologic deficits are appreciated.  Skin:  Skin is warm, dry and intact. No rash noted. Psychiatric: Mood and affect are normal. Speech and behavior are normal.  ____________________________________________  EKG: Interpreted by me.  Sinus rhythm the rate of 96 bpm, normal PR interval, normal QRS, normal QT.  ____________________________________________  ED COURSE:  As part of my medical decision making, I reviewed the following data within the [place] History obtained from family if available, nursing notes, old chart and ekg, as well as notes from prior ED visits. Patient presented for multiple complaints including general ill feeling, anxiety and black tarry diarrhea, we will assess with labs as indicated at this time.   Procedures ____________________________________________   LABS (pertinent positives/negatives)  Labs Reviewed  COMPREHENSIVE METABOLIC PANEL - Abnormal; Notable for the following components:      Result Value   Chloride 114 (*)    CO2 19 (*)    Glucose, Bld 141 (*)    BUN 71 (*)    Creatinine, Ser 1.06 (*)    Total Bilirubin 2.0 (*)    GFR calc non Af Amer 53 (*)    All other components within normal limits  CBC - Abnormal; Notable for the following components:   WBC 17.2 (*)    RBC 3.14 (*)    Hemoglobin 9.9 (*)    HCT 28.6 (*)    All other components within normal limits  URINALYSIS, COMPLETE (UACMP) WITH MICROSCOPIC - Abnormal;  Notable for the following components:   Color, Urine YELLOW (*)    APPearance CLEAR (*)    Hgb urine dipstick MODERATE (*)    Leukocytes, UA SMALL (*)    Bacteria, UA RARE (*)    All other components within normal limits  LIPASE, BLOOD  PROTIME-INR  ____________________________________________  DIFFERENTIAL DIAGNOSIS   Gastritis, stress-induced ulcer, dehydration, heat related illness, anxiety  FINAL ASSESSMENT AND PLAN  Weakness, upper GI bleeding, anemia   Plan: The patient had presented for general ill feeling with anxiety and epigastric pain as well as tarry stools. Patient's labs revealed new anemia with a hemoglobin of 9.9, elevated white blood cell count of uncertain etiology.  BUN was also elevated relative to creatinine likely indicating upper GI bleeding as well.  Her Hemoccult was positive here.  She was given IV fluids as well as IV Protonix, would likely benefit from hospital observation to ensure resolution in her bleeding and no further drop in her hemoglobin.  Laurence Aly, MD   Note: This note was generated in part or whole with voice recognition software. Voice recognition is usually quite accurate but there are transcription errors that can and very often do occur. I apologize for any typographical errors that were not detected and corrected.     Earleen Newport, MD 02/19/18 1038

## 2018-02-19 NOTE — ED Triage Notes (Signed)
Patient presents to the ED with feeling poorly over the past 2 weeks.  Patient states she has been under a lot of stress and has been out in the heat recently.  Patient reports several episodes of diarrhea this morning.  Patient reports feeling somewhat short of breath and light headed.

## 2018-02-19 NOTE — H&P (Signed)
History and Physical    Tonya Cabrera AOZ:[id number] DOB: 11/20/1950 DOA: 02/19/2018  Referring physician: Dr. Jimmye Norman PCP: Valerie Roys, DO  Specialists: none  Chief Complaint: abdominal pain with "tarry" stools  HPI: Tonya Cabrera is a 67 y.o. female has a past medical history significant for CAD and HTN now with 4-5 day hx of epigastric abdominal pain and 1 day hx of tarry stools. Has been more SOB and light-headed. In ER, pt noted to be anemic with guaiac positive stools. Hypotensive and WBC elevated. She is now admitted. No recent EGD or colonoscopy. Denies hx of previous bleeding. Denies CP or palpitations. No N/V/D.  Review of Systems: The patient denies anorexia, fever, weight loss,, vision loss, decreased hearing, hoarseness, chest pain, syncope,  peripheral edema, balance deficits, hemoptysis, hematochezia, severe indigestion/heartburn, hematuria, incontinence, genital sores, muscle weakness, suspicious skin lesions, transient blindness, difficulty walking, depression, unusual weight change, abnormal bleeding, enlarged lymph nodes, angioedema, and breast masses.   Past Medical History:  Diagnosis Date  . Arthritis   . Coronary artery disease    coronary stent  . Dyspnea   . Hard of hearing   . Heart murmur   . Hypercholesteremia   . Hypertension   . Myocardial infarction ([place])    2015  . Osteoporosis   . Stroke [facility])    10-12 years ago   Past Surgical History:  Procedure Laterality Date  . CAROTID STENT    . CATARACT EXTRACTION W/PHACO Right 11/09/2017   Procedure: CATARACT EXTRACTION PHACO AND INTRAOCULAR LENS PLACEMENT ([place]) RIGHT;  Surgeon: Eulogio Bear, MD;  Location: [place];  Service: Ophthalmology;  Laterality: Right;  . CATARACT EXTRACTION W/PHACO Left 11/23/2017   Procedure: CATARACT EXTRACTION PHACO AND INTRAOCULAR LENS PLACEMENT ([place]) LEFT;  Surgeon: Eulogio Bear, MD;  Location: [place];  Service: Ophthalmology;   Laterality: Left;  . INCONTINENCE SURGERY    . THYROID SURGERY     Social History:  reports that she quit smoking about 3 years ago. She has a 33.00 pack-year smoking history. She has never used smokeless tobacco. She reports that she does not drink alcohol or use drugs.  No Known Allergies  Family History  Problem Relation Age of Onset  . Liver cancer Brother   . Heart Problems Mother   . Breast cancer Neg Hx     Prior to Admission medications   Medication Sig Start Date End Date Taking? Authorizing Provider  aspirin 81 MG EC tablet TAKE 1 TABLET (81 MG TOTAL) BY MOUTH DAILY. 12/23/17  Yes Johnson, Megan P, DO  atorvastatin (LIPITOR) 40 MG tablet Take 1 tablet (40 mg total) by mouth daily. 12/23/17  Yes Johnson, Megan P, DO  clopidogrel (PLAVIX) 75 MG tablet Take 1 tablet (75 mg total) by mouth daily. 12/23/17  Yes Johnson, Megan P, DO  cyclobenzaprine (FLEXERIL) 10 MG tablet Take 1 tablet (10 mg total) by mouth at bedtime. 12/23/17  Yes Johnson, Megan P, DO  lisinopril (PRINIVIL,ZESTRIL) 10 MG tablet Take 1 tablet (10 mg total) by mouth daily. 12/23/17  Yes Johnson, Megan P, DO  metoprolol succinate (TOPROL-XL) 25 MG 24 hr tablet Take 1 tablet (25 mg total) by mouth 2 (two) times daily. 12/23/17  Yes Johnson, Megan P, DO  Omega-3 Fatty Acids (FISH OIL) 1000 MG CPDR Take 1 capsule by mouth daily.  12/23/17  Yes Johnson, Megan P, DO  zolpidem (AMBIEN) 5 MG tablet TAKE 1 TABLET BY MOUTH AT BEDTIME AS NEEDED  FOR SLEEP 02/07/18  Yes Laverle Hobby, MD   Physical Exam: Vitals:   02/19/18 0928 02/19/18 0930 02/19/18 1000 02/19/18 1002  BP: (!) 130/57 (!) 97/58 (!) 118/43   Pulse: (!) 58 79  87  Resp: 18 11 17 11   Temp:      TempSrc:      SpO2: 92% 100%  92%  Weight:      Height:         General:  No apparent distress, WDWN, [place]/AT  Eyes: PERRL, EOMI, no scleral icterus, conjunctiva clear  ENT: moist oropharynx without exudate, TM's benign, dentition good  Neck: supple, no  lymphadenopathy. No bruits or thyromegaly  Cardiovascular: regular rate without MRG; 2+ peripheral pulses, no JVD, no peripheral edema  Respiratory: CTA biL, good air movement without wheezing, rhonchi or crackled. Respiratory effort normal  Abdomen: soft, non tender to palpation, positive bowel sounds, no guarding, no rebound  Skin: no rashes or lesions  Musculoskeletal: normal bulk and tone, no joint swelling  Psychiatric: normal mood and affect, A&OX3  Neurologic: CN 2-12 grossly intact, Motor strength 5/5 in all 4 groups with symmetric DTR's and non-focal sensory exam  Labs on Admission:  Basic Metabolic Panel: Recent Labs  Lab 02/19/18 0900  NA 140  K 4.6  CL 114*  CO2 19*  GLUCOSE 141*  BUN 71*  CREATININE 1.06*  CALCIUM 9.4   Liver Function Tests: Recent Labs  Lab 02/19/18 0900  AST 19  ALT 15  ALKPHOS 59  BILITOT 2.0*  PROT 6.8  ALBUMIN 3.9   Recent Labs  Lab 02/19/18 0900  LIPASE 47   No results for input(s): AMMONIA in the last 168 hours. CBC: Recent Labs  Lab 02/19/18 0900  WBC 17.2*  HGB 9.9*  HCT 28.6*  MCV 91.2  PLT 311   Cardiac Enzymes: No results for input(s): CKTOTAL, CKMB, CKMBINDEX, TROPONINI in the last 168 hours.  BNP (last 3 results) No results for input(s): BNP in the last 8760 hours.  ProBNP (last 3 results) No results for input(s): PROBNP in the last 8760 hours.  CBG: No results for input(s): GLUCAP in the last 168 hours.  Radiological Exams on Admission: No results found.  EKG: Independently reviewed.  Assessment/Plan Principal Problem:   GI bleed Active Problems:   CAD (coronary artery disease)   Symptomatic anemia   Abdominal pain   Will observe on floor with IV Protonix and clear liquid diet. Guaiac stools and follow hgb closely. Begin empiric IV ABX and SVN's. CXR ordered. Consult GI. Repeat labs in AM.  Diet: clear liquids Fluids: NS@75  DVT Prophylaxis: TED hose  Code Status: FULL  Family  Communication: yes  Disposition Plan: home  Time spent: 50 min

## 2018-02-19 NOTE — ED Notes (Signed)
Dr Williams at bedside 

## 2018-02-19 NOTE — ED Notes (Signed)
Pt ambulatory to toilet with steady gait noted. Denied needing assistance.

## 2018-02-19 NOTE — ED Notes (Signed)
X-ray at bedside

## 2018-02-19 NOTE — Progress Notes (Signed)
Pharmacy Antibiotic Note  Tonya Cabrera is a 67 y.o. female admitted on 02/19/2018 with intra abdominal infx .  Pharmacy has been consulted for zosyn dosing.  Plan: Zosyn 3.375g IV q8h (4 hour infusion).  Height: 5\' 3"  (160 cm) Weight: 131 lb (59.4 kg) IBW/kg (Calculated) : 52.4  Temp (24hrs), Avg:97.5 F (36.4 C), Min:97.5 F (36.4 C), Max:97.5 F (36.4 C)  Recent Labs  Lab 02/19/18 0900  WBC 17.2*  CREATININE 1.06*    Estimated Creatinine Clearance: 43.2 mL/min (A) (by C-G formula based on SCr of 1.06 mg/dL (H)).    No Known Allergies  Antimicrobials this admission: Anti-infectives (From admission, onward)   Start     Dose/Rate Route Frequency Ordered Stop   02/19/18 1145  piperacillin-tazobactam (ZOSYN) IVPB 3.375 g     3.375 g 12.5 mL/hr over 240 Minutes Intravenous Every 8 hours 02/19/18 1141        Microbiology results: No results found for this or any previous visit (from the past 240 hour(s)).   Thank you for allowing pharmacy to be a part of this patient's care.  Donna Christen Neytiri Asche 02/19/2018 11:41 AM

## 2018-02-20 DIAGNOSIS — Z79899 Other long term (current) drug therapy: Secondary | ICD-10-CM | POA: Diagnosis not present

## 2018-02-20 DIAGNOSIS — K921 Melena: Secondary | ICD-10-CM

## 2018-02-20 DIAGNOSIS — Z791 Long term (current) use of non-steroidal anti-inflammatories (NSAID): Secondary | ICD-10-CM | POA: Diagnosis not present

## 2018-02-20 DIAGNOSIS — Z7982 Long term (current) use of aspirin: Secondary | ICD-10-CM | POA: Diagnosis not present

## 2018-02-20 DIAGNOSIS — K254 Chronic or unspecified gastric ulcer with hemorrhage: Secondary | ICD-10-CM | POA: Diagnosis present

## 2018-02-20 DIAGNOSIS — D62 Acute posthemorrhagic anemia: Secondary | ICD-10-CM | POA: Diagnosis not present

## 2018-02-20 DIAGNOSIS — H9190 Unspecified hearing loss, unspecified ear: Secondary | ICD-10-CM | POA: Diagnosis present

## 2018-02-20 DIAGNOSIS — M810 Age-related osteoporosis without current pathological fracture: Secondary | ICD-10-CM | POA: Diagnosis present

## 2018-02-20 DIAGNOSIS — Z8673 Personal history of transient ischemic attack (TIA), and cerebral infarction without residual deficits: Secondary | ICD-10-CM | POA: Diagnosis not present

## 2018-02-20 DIAGNOSIS — R0602 Shortness of breath: Secondary | ICD-10-CM | POA: Diagnosis present

## 2018-02-20 DIAGNOSIS — Z87891 Personal history of nicotine dependence: Secondary | ICD-10-CM | POA: Diagnosis not present

## 2018-02-20 DIAGNOSIS — I252 Old myocardial infarction: Secondary | ICD-10-CM | POA: Diagnosis not present

## 2018-02-20 DIAGNOSIS — Z955 Presence of coronary angioplasty implant and graft: Secondary | ICD-10-CM | POA: Diagnosis not present

## 2018-02-20 DIAGNOSIS — K922 Gastrointestinal hemorrhage, unspecified: Secondary | ICD-10-CM | POA: Diagnosis present

## 2018-02-20 DIAGNOSIS — E7800 Pure hypercholesterolemia, unspecified: Secondary | ICD-10-CM | POA: Diagnosis present

## 2018-02-20 DIAGNOSIS — I10 Essential (primary) hypertension: Secondary | ICD-10-CM | POA: Diagnosis present

## 2018-02-20 DIAGNOSIS — I2510 Atherosclerotic heart disease of native coronary artery without angina pectoris: Secondary | ICD-10-CM | POA: Diagnosis present

## 2018-02-20 DIAGNOSIS — Z7902 Long term (current) use of antithrombotics/antiplatelets: Secondary | ICD-10-CM | POA: Diagnosis not present

## 2018-02-20 LAB — CBC
HCT: 23 % — ABNORMAL LOW (ref 35.0–47.0)
HEMOGLOBIN: 8 g/dL — AB (ref 12.0–16.0)
MCH: 31.7 pg (ref 26.0–34.0)
MCHC: 34.6 g/dL (ref 32.0–36.0)
MCV: 91.6 fL (ref 80.0–100.0)
Platelets: 171 10*3/uL (ref 150–440)
RBC: 2.51 MIL/uL — AB (ref 3.80–5.20)
RDW: 14 % (ref 11.5–14.5)
WBC: 8.3 10*3/uL (ref 3.6–11.0)

## 2018-02-20 LAB — COMPREHENSIVE METABOLIC PANEL
ALBUMIN: 3.5 g/dL (ref 3.5–5.0)
ALK PHOS: 51 U/L (ref 38–126)
ALT: 12 U/L — AB (ref 14–54)
ANION GAP: 6 (ref 5–15)
AST: 19 U/L (ref 15–41)
BUN: 34 mg/dL — ABNORMAL HIGH (ref 6–20)
CHLORIDE: 118 mmol/L — AB (ref 101–111)
CO2: 19 mmol/L — AB (ref 22–32)
Calcium: 8 mg/dL — ABNORMAL LOW (ref 8.9–10.3)
Creatinine, Ser: 1.07 mg/dL — ABNORMAL HIGH (ref 0.44–1.00)
GFR calc Af Amer: >60 mL/min (ref >60)
GFR calc non Af Amer: 53 mL/min — ABNORMAL LOW (ref >60)
GLUCOSE: 91 mg/dL (ref 65–99)
Potassium: 3.5 mmol/L (ref 3.5–5.1)
SODIUM: 143 mmol/L (ref 135–145)
Total Bilirubin: 1.5 mg/dL — ABNORMAL HIGH (ref 0.3–1.2)
Total Protein: 6 g/dL — ABNORMAL LOW (ref 6.5–8.1)

## 2018-02-20 LAB — HEMOGLOBIN AND HEMATOCRIT, BLOOD
HCT: 24.8 % — ABNORMAL LOW (ref 35.0–47.0)
Hemoglobin: 8.6 g/dL — ABNORMAL LOW (ref 12.0–16.0)

## 2018-02-20 MED ORDER — LORAZEPAM 2 MG/ML IJ SOLN
0.5000 mg | Freq: Three times a day (TID) | INTRAMUSCULAR | Status: DC | PRN
  Administered 2018-02-20: 0.5 mg via INTRAVENOUS
  Filled 2018-02-20: qty 1

## 2018-02-20 NOTE — Progress Notes (Signed)
[facility] NAME: Tonya Cabrera    MR#:  [id number]  DATE OF BIRTH:  1951/08/26  SUBJECTIVE:   Patient presents with dark-colored stools and abdominal pain.  REVIEW OF SYSTEMS:    Review of Systems  Constitutional: Negative for fever, chills weight loss HENT: Negative for ear pain, nosebleeds, congestion, facial swelling, rhinorrhea, neck pain, neck stiffness and ear discharge.   Respiratory: Negative for cough, shortness of breath, wheezing  Cardiovascular: Negative for chest pain, palpitations and leg swelling.  Gastrointestinal: Midepigastric abdominal pain with dark-colored stools no constipation or diarrhea  genitourinary: Negative for dysuria, urgency, frequency, hematuria Musculoskeletal: Negative for back pain or joint pain Neurological: Negative for dizziness, seizures, syncope, focal weakness,  numbness and headaches.  Hematological: Does not bruise/bleed easily.  Psychiatric/Behavioral: Negative for hallucinations, confusion, dysphoric mood    Tolerating Diet: yes      DRUG ALLERGIES:  No Known Allergies  VITALS:  Blood pressure (!) 116/47, pulse 79, temperature 97.8 F (36.6 C), temperature source Oral, resp. rate 20, height 5\' 3"  (1.6 m), weight 59.4 kg (131 lb), SpO2 94 %.  PHYSICAL EXAMINATION:  Constitutional: Appears well-developed and well-nourished. No distress. HENT: Normocephalic. [place] Oropharynx is clear and moist.  Eyes: Conjunctivae and EOM are normal. PERRLA, no scleral icterus.  Neck: Normal ROM. Neck supple. No JVD. No tracheal deviation. CVS: RRR, S1/S2 +, no murmurs, no gallops, no carotid bruit.  Pulmonary: Effort and breath sounds normal, no stridor, rhonchi, wheezes, rales.  Abdominal: Soft. BS +,  no distension, tenderness, rebound or guarding.  Musculoskeletal: Normal range of motion. No edema and no tenderness.  Neuro: Alert. CN 2-12 grossly intact. No focal deficits. Skin: Skin is warm  and dry. No rash noted. Psychiatric: Normal mood and affect.      LABORATORY PANEL:   CBC Recent Labs  Lab 02/20/18 0531  WBC 8.3  HGB 8.0*  HCT 23.0*  PLT 171   ------------------------------------------------------------------------------------------------------------------  Chemistries  Recent Labs  Lab 02/20/18 0531  NA 143  K 3.5  CL 118*  CO2 19*  GLUCOSE 91  BUN 34*  CREATININE 1.07*  CALCIUM 8.0*  AST 19  ALT 12*  ALKPHOS 51  BILITOT 1.5*   ------------------------------------------------------------------------------------------------------------------  Cardiac Enzymes No results for input(s): TROPONINI in the last 168 hours. ------------------------------------------------------------------------------------------------------------------  RADIOLOGY:  Dg Chest Port 1 View  Result Date: 02/19/2018 CLINICAL DATA:  Shortness of breath. EXAM: PORTABLE CHEST 1 VIEW COMPARISON:  Radiograph of May 15, 2014. FINDINGS: The heart size and mediastinal contours are within normal limits. Both lungs are clear. No pneumothorax or pleural effusion is noted. The visualized skeletal structures are unremarkable. IMPRESSION: No acute cardiopulmonary abnormality seen. Electronically Signed   By: Marijo Conception, M.D.   On: 02/19/2018 11:49     ASSESSMENT AND PLAN:   67 year old female with a history of CAD/stent on aspirin and Plavix who presents with melena.  1.  Melanotic stool in the setting of aspirin and Plavix Continue PPI Hold aspirin and Plavix Plan for EGD on Tuesday Clear liquid diet this afternoon and soft diet if tolerated Monitor CBC No indication for blood transfusion at this point Appreciate GI evaluation  2.  CAD: Continue atorvastatin and lisinopril, metoprolol Stop aspirin and Plavix for now  3.  Hyperlipidemia: Continue atorvastatin  Discussed with GI Management plans discussed with the patient and she is in agreement.  CODE STATUS:  full  TOTAL TIME TAKING CARE OF THIS PATIENT:  30 minutes.     POSSIBLE D/C 3-4 days, DEPENDING ON CLINICAL CONDITION.   Damari Hiltz M.D on 02/20/2018 at 10:32 AM  Between 7am to 6pm - Pager - [phone] After 6pm go to [email or web address] - password [email or web address]  Office  [phone]  CC: Primary care physician; Valerie Roys, DO  Note: This dictation was prepared with Dragon dictation along with smaller phrase technology. Any transcriptional errors that result from this process are unintentional.

## 2018-02-20 NOTE — Consult Note (Addendum)
[name], MD [address], [place], [place], [place], [address] [address], [place], [place], [place], [address] Phone: [phone]  Fax: [phone]  Consultation  Referring Provider:     Dr. Benjie Karvonen Primary Care Physician:  Valerie Roys, DO Reason for Consultation:     Melena  Date of Admission:  02/19/2018 Date of Consultation:  02/20/2018         HPI:   Tonya Cabrera is a 67 y.o. female who presents with melena at home.  Patient states the symptoms first started 3 to 4 weeks ago.  She would notice intermittent black stools 1-2 times a week.  On the day of presentation she had 4 bowel movements with black stools that prompted her to come to the ER.  She denies any abdominal pain.  No nausea or vomiting.  No bright red blood per rectum.  No previous history of GI bleeds.  Patient is on aspirin and Plavix at home, due to heart stents placed 4 years ago.  States she takes naproxen intermittently, last use was 1 month ago.  Usually takes it once or twice a week.  No other NSAIDs.  No previous upper endoscopy, no previous colonoscopy.  No family history of colon cancer.  Denies any altered bowel habits, no constipation or diarrhea.  Last bowel movement was over 24 hours ago.    Past Medical History:  Diagnosis Date  . Arthritis   . Coronary artery disease    coronary stent  . Dyspnea   . Hard of hearing   . Heart murmur   . Hypercholesteremia   . Hypertension   . Myocardial infarction ([place])    2015  . Osteoporosis   . Stroke [facility])    10-12 years ago    Past Surgical History:  Procedure Laterality Date  . CAROTID STENT    . CATARACT EXTRACTION W/PHACO Right 11/09/2017   Procedure: CATARACT EXTRACTION PHACO AND INTRAOCULAR LENS PLACEMENT ([place]) RIGHT;  Surgeon: Eulogio Bear, MD;  Location: [place];  Service: Ophthalmology;  Laterality: Right;  . CATARACT EXTRACTION W/PHACO Left 11/23/2017   Procedure: CATARACT EXTRACTION PHACO AND  INTRAOCULAR LENS PLACEMENT ([place]) LEFT;  Surgeon: Eulogio Bear, MD;  Location: [place];  Service: Ophthalmology;  Laterality: Left;  . INCONTINENCE SURGERY    . THYROID SURGERY      Prior to Admission medications   Medication Sig Start Date End Date Taking? Authorizing Provider  aspirin 81 MG EC tablet TAKE 1 TABLET (81 MG TOTAL) BY MOUTH DAILY. 12/23/17  Yes Johnson, Megan P, DO  atorvastatin (LIPITOR) 40 MG tablet Take 1 tablet (40 mg total) by mouth daily. 12/23/17  Yes Johnson, Megan P, DO  clopidogrel (PLAVIX) 75 MG tablet Take 1 tablet (75 mg total) by mouth daily. 12/23/17  Yes Johnson, Megan P, DO  cyclobenzaprine (FLEXERIL) 10 MG tablet Take 1 tablet (10 mg total) by mouth at bedtime. 12/23/17  Yes Johnson, Megan P, DO  lisinopril (PRINIVIL,ZESTRIL) 10 MG tablet Take 1 tablet (10 mg total) by mouth daily. 12/23/17  Yes Johnson, Megan P, DO  metoprolol succinate (TOPROL-XL) 25 MG 24 hr tablet Take 1 tablet (25 mg total) by mouth 2 (two) times daily. 12/23/17  Yes Johnson, Megan P, DO  Omega-3 Fatty Acids (FISH OIL) 1000 MG CPDR Take 1 capsule by mouth daily.  12/23/17  Yes Johnson, Megan P, DO  zolpidem (AMBIEN) 5 MG tablet TAKE 1 TABLET BY MOUTH AT BEDTIME AS  NEEDED FOR SLEEP 02/07/18  Yes Laverle Hobby, MD    Family History  Problem Relation Age of Onset  . Liver cancer Brother   . Heart Problems Mother   . Breast cancer Neg Hx      Social History   Tobacco Use  . Smoking status: Former Smoker    Packs/day: 1.50    Years: 22.00    Pack years: 33.00    Last attempt to quit: 05/08/2014    Years since quitting: 3.7  . Smokeless tobacco: Never Used  . Tobacco comment: Smoked over 25 years, quit after Heart Attack in 2015.    Substance Use Topics  . Alcohol use: No    Alcohol/week: 0.0 oz  . Drug use: No    Allergies as of 02/19/2018  . (No Known Allergies)    Review of Systems:    All systems reviewed and negative except where noted in HPI.    Physical Exam:  Vital signs in last 24 hours: Vitals:   02/19/18 2134 02/20/18 0612 02/20/18 0751 02/20/18 0910  BP: (!) 142/56 (!) 108/58  (!) 116/47  Pulse: 87 81  79  Resp: 20 20    Temp: 98.1 F (36.7 C) 97.8 F (36.6 C)    TempSrc: Oral Oral    SpO2: 100% 100% 94%   Weight:      Height:       Last BM Date: 02/19/18 General:   Pleasant, cooperative in NAD Head:  Normocephalic and atraumatic. Eyes:   No icterus.   Conjunctiva pink. PERRLA. Ears:  Normal auditory acuity. Neck:  Supple; no masses or thyroidomegaly Lungs: Respirations even and unlabored. Lungs clear to auscultation bilaterally.   No wheezes, crackles, or rhonchi.  Abdomen:  Soft, nondistended, nontender. Normal bowel sounds. No appreciable masses or hepatomegaly.  No rebound or guarding.  Neurologic:  Alert and oriented x3;  grossly normal neurologically. Skin:  Intact without significant lesions or rashes. Cervical Nodes:  No significant cervical adenopathy. Psych:  Alert and cooperative. Normal affect.  LAB RESULTS: Recent Labs    02/19/18 0900 02/19/18 1832 02/20/18 0531  WBC 17.2*  --  8.3  HGB 9.9* 8.5* 8.0*  HCT 28.6*  --  23.0*  PLT 311  --  171   BMET Recent Labs    02/19/18 0900 02/20/18 0531  NA 140 143  K 4.6 3.5  CL 114* 118*  CO2 19* 19*  GLUCOSE 141* 91  BUN 71* 34*  CREATININE 1.06* 1.07*  CALCIUM 9.4 8.0*   LFT Recent Labs    02/20/18 0531  PROT 6.0*  ALBUMIN 3.5  AST 19  ALT 12*  ALKPHOS 51  BILITOT 1.5*   PT/INR Recent Labs    02/19/18 0931  LABPROT 13.8  INR 1.07    STUDIES: Dg Chest Port 1 View  Result Date: 02/19/2018 CLINICAL DATA:  Shortness of breath. EXAM: PORTABLE CHEST 1 VIEW COMPARISON:  Radiograph of May 15, 2014. FINDINGS: The heart size and mediastinal contours are within normal limits. Both lungs are clear. No pneumothorax or pleural effusion is noted. The visualized skeletal structures are unremarkable. IMPRESSION: No acute cardiopulmonary  abnormality seen. Electronically Signed   By: Marijo Conception, M.D.   On: 02/19/2018 11:49      Impression / Plan:   Tonya Cabrera is a 67 y.o. y/o female with Melanotic stool, in the setting of aspirin and Plavix use, naproxen use a month ago  Melanotic stool in the setting of aspirin  use, and intermittent naproxen use, is likely due to peptic ulcer disease from NSAID use  Patient reports last Plavix use was yesterday morning, which is only about 24 hours ago  Endoscopic procedures, with endoscopic hemostasis, is considered a high risk procedure, and per GI Society guidelines, Plavix should be held for 3 to 5 days prior to high risk endoscopic procedures.  Patient is currently hemodynamically stable, with last bowel movement being over 24 hours ago  We will plan on upper endoscopy after 3 to 5 days of holding Plavix, for the safety of the patient.  The earliest, the procedure can be done with thus be 3 days after last Plavix dose, which would be Tuesday.   However, if patient has acute GI bleeding, changes in hemodynamic status, or any other reason for concern, please page GI for evaluation of emergent upper endoscopy.  Low threshold for transfer to the ICU, with any hemodynamic changes or active GI bleeding.  No indication for emergent endoscopy at this time  PPI IV twice daily  Continue serial CBCs and transfuse PRN Avoid NSAIDs Maintain 2 large-bore IV lines Please page GI with any acute hemodynamic changes, or signs of active GI bleeding  Clear liquid diet okay today If Patient does not have any episodes of GI bleeding, diet can be advanced for dinner today  Would also recommend cardiology consult to determine if patient needs to be continued on dual antiplatelet therapy long-term, even if her GI bleed resolves on this admission, given that her MI and stenting was 4 years ago  Above plan discussed with patient, and she is agreeable and verbalized understanding  I have  discussed alternative options, risks & benefits,  which include, but are not limited to, bleeding, infection, perforation,respiratory complication & drug reaction.  The patient agrees with this plan & written consent will be obtained.     Thank you for involving me in the care of this patient.      LOS: 0 days   Virgel Manifold, MD  02/20/2018, 9:46 AM

## 2018-02-21 LAB — CBC
HEMATOCRIT: 23.3 % — AB (ref 35.0–47.0)
HEMOGLOBIN: 8.1 g/dL — AB (ref 12.0–16.0)
MCH: 31.9 pg (ref 26.0–34.0)
MCHC: 34.9 g/dL (ref 32.0–36.0)
MCV: 91.5 fL (ref 80.0–100.0)
Platelets: 163 10*3/uL (ref 150–440)
RBC: 2.54 MIL/uL — ABNORMAL LOW (ref 3.80–5.20)
RDW: 14.2 % (ref 11.5–14.5)
WBC: 6.4 10*3/uL (ref 3.6–11.0)

## 2018-02-21 LAB — HIV ANTIBODY (ROUTINE TESTING W REFLEX): HIV SCREEN 4TH GENERATION: NONREACTIVE

## 2018-02-21 MED ORDER — PROPOFOL 500 MG/50ML IV EMUL
INTRAVENOUS | Status: AC
Start: 2018-02-21 — End: ?
  Filled 2018-02-21: qty 50

## 2018-02-21 NOTE — Progress Notes (Signed)
[place] at [place] NAME: Tonya Cabrera    MR#:  [id number]  DATE OF BIRTH:  1951-04-22  SUBJECTIVE:   Patient still with dark-colored stools  REVIEW OF SYSTEMS:    Review of Systems  Constitutional: Negative for fever, chills weight loss HENT: Negative for ear pain, nosebleeds, congestion, facial swelling, rhinorrhea, neck pain, neck stiffness and ear discharge.   Respiratory: Negative for cough, shortness of breath, wheezing  Cardiovascular: Negative for chest pain, palpitations and leg swelling.  Gastrointestinal: no abdominal pain +++ dark-colored stools no constipation or diarrhea  genitourinary: Negative for dysuria, urgency, frequency, hematuria Musculoskeletal: Negative for back pain or joint pain Neurological: Negative for dizziness, seizures, syncope, focal weakness,  numbness and headaches.  Hematological: Does not bruise/bleed easily.  Psychiatric/Behavioral: Negative for hallucinations, confusion, dysphoric mood    Tolerating Diet: yes      DRUG ALLERGIES:  No Known Allergies  VITALS:  Blood pressure (!) 115/52, pulse 82, temperature 98.1 F (36.7 C), temperature source Oral, resp. rate 16, height 5\' 3"  (1.6 m), weight 60.1 kg (132 lb 6.4 oz), SpO2 100 %.  PHYSICAL EXAMINATION:  Constitutional: Appears well-developed and well-nourished. No distress. HENT: Normocephalic. Marland Kitchen Oropharynx is clear and moist.  Eyes: Conjunctivae and EOM are normal. PERRLA, no scleral icterus.  Neck: Normal ROM. Neck supple. No JVD. No tracheal deviation. CVS: RRR, S1/S2 +, no murmurs, no gallops, no carotid bruit.  Pulmonary: Effort and breath sounds normal, no stridor, rhonchi, wheezes, rales.  Abdominal: Soft. BS +,  no distension, tenderness, rebound or guarding.  Musculoskeletal: Normal range of motion. No edema and no tenderness.  Neuro: Alert. CN 2-12 grossly intact. No focal deficits. Skin: Skin is warm and dry. No rash  noted. Psychiatric: Normal mood and affect.      LABORATORY PANEL:   CBC Recent Labs  Lab 02/21/18 0456  WBC 6.4  HGB 8.1*  HCT 23.3*  PLT 163   ------------------------------------------------------------------------------------------------------------------  Chemistries  Recent Labs  Lab 02/20/18 0531  NA 143  K 3.5  CL 118*  CO2 19*  GLUCOSE 91  BUN 34*  CREATININE 1.07*  CALCIUM 8.0*  AST 19  ALT 12*  ALKPHOS 51  BILITOT 1.5*   ------------------------------------------------------------------------------------------------------------------  Cardiac Enzymes No results for input(s): TROPONINI in the last 168 hours. ------------------------------------------------------------------------------------------------------------------  RADIOLOGY:  Dg Chest Port 1 View  Result Date: 02/19/2018 CLINICAL DATA:  Shortness of breath. EXAM: PORTABLE CHEST 1 VIEW COMPARISON:  Radiograph of May 15, 2014. FINDINGS: The heart size and mediastinal contours are within normal limits. Both lungs are clear. No pneumothorax or pleural effusion is noted. The visualized skeletal structures are unremarkable. IMPRESSION: No acute cardiopulmonary abnormality seen. Electronically Signed   By: Marijo Conception, M.D.   On: 02/19/2018 11:49     ASSESSMENT AND PLAN:   67 year old female with a history of CAD/stent on aspirin and Plavix who presents with melena.  1.  Melanotic stool in the setting of aspirin and Plavix Continue PPI Holding aspirin and Plavix Plan for EGD on Tuesday hgb stable No indication for blood transfusion at this point Appreciate GI evaluation  2.  CAD: Continue atorvastatin and lisinopril, metoprolol Stop aspirin and Plavix for now  3.  Hyperlipidemia: Continue atorvastatin  Discussed with GI Management plans discussed with the patient and she is in agreement.  CODE STATUS: full  TOTAL TIME TAKING CARE OF THIS PATIENT: 24 minutes.     POSSIBLE D/C  2-3 days, DEPENDING  ON CLINICAL CONDITION.   Tonya Cabrera M.D on 02/21/2018 at 11:44 AM  Between 7am to 6pm - Pager - [phone] After 6pm go to [email or web address] - password [email or web address] [facility]  Office  [phone]  CC: Primary care physician; Valerie Roys, DO  Note: This dictation was prepared with Dragon dictation along with smaller phrase technology. Any transcriptional errors that result from this process are unintentional.

## 2018-02-22 ENCOUNTER — Inpatient Hospital Stay: Payer: Medicare Other | Admitting: Anesthesiology

## 2018-02-22 ENCOUNTER — Encounter
Admission: EM | Disposition: A | Discharge status: Home / Self Care | Payer: Self-pay | Source: Home / Self Care | Attending: Internal Medicine

## 2018-02-22 ENCOUNTER — Encounter: Payer: Self-pay | Admitting: Anesthesiology

## 2018-02-22 DIAGNOSIS — K253 Acute gastric ulcer without hemorrhage or perforation: Secondary | ICD-10-CM

## 2018-02-22 DIAGNOSIS — D62 Acute posthemorrhagic anemia: Secondary | ICD-10-CM

## 2018-02-22 DIAGNOSIS — K921 Melena: Secondary | ICD-10-CM

## 2018-02-22 HISTORY — PX: ESOPHAGOGASTRODUODENOSCOPY (EGD) WITH PROPOFOL: SHX5813

## 2018-02-22 LAB — CBC
HEMATOCRIT: 21.7 % — AB (ref 35.0–47.0)
HEMOGLOBIN: 7.7 g/dL — AB (ref 12.0–16.0)
MCH: 32.4 pg (ref 26.0–34.0)
MCHC: 35.5 g/dL (ref 32.0–36.0)
MCV: 91.3 fL (ref 80.0–100.0)
Platelets: 153 10*3/uL (ref 150–440)
RBC: 2.38 MIL/uL — AB (ref 3.80–5.20)
RDW: 13.9 % (ref 11.5–14.5)
WBC: 7.4 10*3/uL (ref 3.6–11.0)

## 2018-02-22 SURGERY — ESOPHAGOGASTRODUODENOSCOPY (EGD) WITH PROPOFOL
Anesthesia: General

## 2018-02-22 SURGERY — EGD (ESOPHAGOGASTRODUODENOSCOPY)
Anesthesia: General | Laterality: Left

## 2018-02-22 MED ORDER — ESOMEPRAZOLE MAGNESIUM 40 MG PO CPDR: 40.0000 mg | DELAYED_RELEASE_CAPSULE | Freq: Two times a day (BID) | ORAL | 11 refills | Status: DC

## 2018-02-22 MED ORDER — PROPOFOL 500 MG/50ML IV EMUL
INTRAVENOUS | Status: DC | PRN
  Administered 2018-02-22: 100 ug/kg/min via INTRAVENOUS

## 2018-02-22 MED ORDER — SODIUM CHLORIDE 0.9 % IV SOLN: INTRAVENOUS | Status: DC

## 2018-02-22 MED ORDER — PROPOFOL 10 MG/ML IV BOLUS
INTRAVENOUS | Status: DC | PRN
  Administered 2018-02-22: 20 mg via INTRAVENOUS

## 2018-02-22 NOTE — H&P (Signed)
Tonya Antigua, MD [address] [place], [place], [place], [address] [address], [place], [place], [place], [address] Phone: [phone]  Fax: [phone]  Primary Care Physician:  Valerie Roys, DO   Pre-Procedure History & Physical: HPI:  Tonya Cabrera is a 67 y.o. female is here for an EGD.   Past Medical History:  Diagnosis Date  . Arthritis   . Coronary artery disease    coronary stent  . Dyspnea   . Hard of hearing   . Heart murmur   . Hypercholesteremia   . Hypertension   . Myocardial infarction ([place])    2015  . Osteoporosis   . Stroke [facility])    10-12 years ago    Past Surgical History:  Procedure Laterality Date  . CAROTID STENT    . CATARACT EXTRACTION W/PHACO Right 11/09/2017   Procedure: CATARACT EXTRACTION PHACO AND INTRAOCULAR LENS PLACEMENT ([place]) RIGHT;  Surgeon: Eulogio Bear, MD;  Location: [place];  Service: Ophthalmology;  Laterality: Right;  . CATARACT EXTRACTION W/PHACO Left 11/23/2017   Procedure: CATARACT EXTRACTION PHACO AND INTRAOCULAR LENS PLACEMENT ([place]) LEFT;  Surgeon: Eulogio Bear, MD;  Location: [place];  Service: Ophthalmology;  Laterality: Left;  . INCONTINENCE SURGERY    . THYROID SURGERY      Prior to Admission medications   Medication Sig Start Date End Date Taking? Authorizing Provider  aspirin 81 MG EC tablet TAKE 1 TABLET (81 MG TOTAL) BY MOUTH DAILY. 12/23/17  Yes Johnson, Megan P, DO  atorvastatin (LIPITOR) 40 MG tablet Take 1 tablet (40 mg total) by mouth daily. 12/23/17  Yes Johnson, Megan P, DO  clopidogrel (PLAVIX) 75 MG tablet Take 1 tablet (75 mg total) by mouth daily. 12/23/17  Yes Johnson, Megan P, DO  cyclobenzaprine (FLEXERIL) 10 MG tablet Take 1 tablet (10 mg total) by mouth at bedtime. 12/23/17  Yes Johnson, Megan P, DO  lisinopril (PRINIVIL,ZESTRIL) 10 MG tablet Take 1 tablet (10 mg total) by mouth daily. 12/23/17  Yes Johnson, Megan P, DO  metoprolol succinate (TOPROL-XL)  25 MG 24 hr tablet Take 1 tablet (25 mg total) by mouth 2 (two) times daily. 12/23/17  Yes Johnson, Megan P, DO  Omega-3 Fatty Acids (FISH OIL) 1000 MG CPDR Take 1 capsule by mouth daily.  12/23/17  Yes Johnson, Megan P, DO  zolpidem (AMBIEN) 5 MG tablet TAKE 1 TABLET BY MOUTH AT BEDTIME AS NEEDED FOR SLEEP 02/07/18  Yes Laverle Hobby, MD  esomeprazole (NEXIUM) 40 MG capsule Take 1 capsule (40 mg total) by mouth 2 (two) times daily before a meal. 02/22/18 02/22/19  Bettey Costa, MD    Allergies as of 02/19/2018  . (No Known Allergies)    Family History  Problem Relation Age of Onset  . Liver cancer Brother   . Heart Problems Mother   . Breast cancer Neg Hx     Social History   Socioeconomic History  . Marital status: Divorced    Spouse name: Not on file  . Number of children: Not on file  . Years of education: Not on file  . Highest education level: Not on file  Occupational History  . Not on file  Social Needs  . Financial resource strain: Not on file  . Food insecurity:    Worry: Not on file    Inability: Not on file  . Transportation needs:    Medical: Not on file    Non-medical: Not on file  Tobacco Use  .  Smoking status: Former Smoker    Packs/day: 1.50    Years: 22.00    Pack years: 33.00    Last attempt to quit: 05/08/2014    Years since quitting: 3.7  . Smokeless tobacco: Never Used  . Tobacco comment: Smoked over 25 years, quit after Heart Attack in 2015.    Substance and Sexual Activity  . Alcohol use: No    Alcohol/week: 0.0 oz  . Drug use: No  . Sexual activity: Not Currently  Lifestyle  . Physical activity:    Days per week: Not on file    Minutes per session: Not on file  . Stress: Not on file  Relationships  . Social connections:    Talks on phone: Not on file    Gets together: Not on file    Attends religious service: Not on file    Active member of club or organization: Not on file    Attends meetings of clubs or organizations: Not on file     Relationship status: Not on file  . Intimate partner violence:    Fear of current or ex partner: Not on file    Emotionally abused: Not on file    Physically abused: Not on file    Forced sexual activity: Not on file  Other Topics Concern  . Not on file  Social History Narrative  . Not on file    Review of Systems: See HPI, otherwise negative ROS  Physical Exam: BP 127/64   Pulse 80   Temp 98.6 F (37 C) (Tympanic)   Resp 16   Ht 5\' 3"  (1.6 m)   Wt 137 lb 12.6 oz (62.5 kg)   SpO2 99%   BMI 24.41 kg/m  General:   Alert,  pleasant and cooperative in NAD Head:  Normocephalic and atraumatic. Neck:  Supple; no masses or thyromegaly. Lungs:  Clear throughout to auscultation, normal respiratory effort.    Heart:  +S1, +S2, Regular rate and rhythm, No edema. Abdomen:  Soft, nontender and nondistended. Normal bowel sounds, without guarding, and without rebound.   Neurologic:  Alert and  oriented x4;  grossly normal neurologically.  Impression/Plan: Tonya Cabrera is here for an EGD for melena  Risks, benefits, limitations, and alternatives regarding the procedure have been reviewed with the patient.  Questions have been answered.  All parties agreeable.   Virgel Manifold, MD  02/22/2018, 11:25 AM

## 2018-02-22 NOTE — Progress Notes (Signed)
[place] at [place] NAME: Tonya Cabrera    MR#:  [id number]  DATE OF BIRTH:  07/23/1951  SUBJECTIVE:   Patient is very anxious today.  She thinks she is losing her house.  She denies dark-colored stools or abdominal pain.  REVIEW OF SYSTEMS:    Review of Systems  Constitutional: Negative for fever, chills weight loss HENT: Negative for ear pain, nosebleeds, congestion, facial swelling, rhinorrhea, neck pain, neck stiffness and ear discharge.   Respiratory: Negative for cough, shortness of breath, wheezing  Cardiovascular: Negative for chest pain, palpitations and leg swelling.  Gastrointestinal: no abdominal pain denies dark-colored stools no constipation or diarrhea  genitourinary: Negative for dysuria, urgency, frequency, hematuria Musculoskeletal: Negative for back pain or joint pain Neurological: Negative for dizziness, seizures, syncope, focal weakness,  numbness and headaches.  Hematological: Does not bruise/bleed easily.  Psychiatric/Behavioral: Negative for hallucinations, confusion, positive dysphoric mood    Tolerating Diet: yes      DRUG ALLERGIES:  No Known Allergies  VITALS:  Blood pressure (!) 158/70, pulse 75, temperature 97.8 F (36.6 C), temperature source Oral, resp. rate 17, height 5\' 3"  (1.6 m), weight 62.5 kg (137 lb 12.6 oz), SpO2 100 %.  PHYSICAL EXAMINATION:  Constitutional: Appears well-developed and well-nourished. No distress. HENT: Normocephalic. Marland Kitchen Oropharynx is clear and moist.  Eyes: Conjunctivae and EOM are normal. PERRLA, no scleral icterus.  Neck: Normal ROM. Neck supple. No JVD. No tracheal deviation. CVS: RRR, S1/S2 +, no murmurs, no gallops, no carotid bruit.  Pulmonary: Effort and breath sounds normal, no stridor, rhonchi, wheezes, rales.  Abdominal: Soft. BS +,  no distension, tenderness, rebound or guarding.  Musculoskeletal: Normal range of motion. No edema and no tenderness.  Neuro:  Alert. CN 2-12 grossly intact. No focal deficits. Skin: Skin is warm and dry. No rash noted. Psychiatric: Tearful    LABORATORY PANEL:   CBC Recent Labs  Lab 02/22/18 0509  WBC 7.4  HGB 7.7*  HCT 21.7*  PLT 153   ------------------------------------------------------------------------------------------------------------------  Chemistries  Recent Labs  Lab 02/20/18 0531  NA 143  K 3.5  CL 118*  CO2 19*  GLUCOSE 91  BUN 34*  CREATININE 1.07*  CALCIUM 8.0*  AST 19  ALT 12*  ALKPHOS 51  BILITOT 1.5*   ------------------------------------------------------------------------------------------------------------------  Cardiac Enzymes No results for input(s): TROPONINI in the last 168 hours. ------------------------------------------------------------------------------------------------------------------  RADIOLOGY:  No results found.   ASSESSMENT AND PLAN:   67 year old female with a history of CAD/stent on aspirin and Plavix who presents with melena.  1.  Melanotic stool in the setting of aspirin and Plavix Continue PPI Holding aspirin and Plavix Plan for EGD today  Hgb 7.7  no indication for blood transfusion at this point Appreciate GI consultation  2.  CAD: Continue atorvastatin and lisinopril, metoprolol Stop aspirin and Plavix for now  3.  Hyperlipidemia: Continue atorvastatin  Discussed with GI Management plans discussed with the patient and she is in agreement.  CODE STATUS: full  TOTAL TIME TAKING CARE OF THIS PATIENT: 24 minutes.     POSSIBLE D/C 1 day, DEPENDING ON CLINICAL CONDITION.   Plez Belton M.D on 02/22/2018 at 11:11 AM  Between 7am to 6pm - Pager - [phone] After 6pm go to [email or web address] - password [email or web address] [facility]  Office  [phone]  CC: Primary care physician; Valerie Roys, DO  Note: This dictation was prepared with Dragon dictation along with smaller phrase technology. Any  transcriptional errors that result from this process are unintentional.

## 2018-02-22 NOTE — Op Note (Signed)
[facility] Patient Name: Tonya Cabrera Procedure Date: 02/22/2018 10:36 AM MRN: [id number] Account #: [id number] Date of Birth: 04/01/1951 Admit Type: Inpatient Age: 67 Room: [facility] ENDO ROOM 1 Gender: Female Note Status: Finalized Procedure:            Upper GI endoscopy Indications:          Acute post hemorrhagic anemia, Melena Providers:            Soriah Leeman B. Bonna Gains MD, MD Referring MD:         Valerie Roys (Referring MD) Medicines:            Monitored Anesthesia Care Complications:        No immediate complications. Procedure:            Pre-Anesthesia Assessment:                       - The risks and benefits of the procedure and the                        sedation options and risks were discussed with the                        patient. All questions were answered and informed                        consent was obtained.                       - Patient identification and proposed procedure were                        verified prior to the procedure.                       - ASA Grade Assessment: III - A patient with severe                        systemic disease.                       After obtaining informed consent, the endoscope was                        passed under direct vision. Throughout the procedure,                        the patient's blood pressure, pulse, and oxygen                        saturations were monitored continuously. The Endoscope                        was introduced through the mouth, and advanced to the                        second part of duodenum. The upper GI endoscopy was                        accomplished with ease. The patient tolerated the  procedure well. Findings:      The examined esophagus was normal.      One non-bleeding cratered gastric ulcer with a flat pigmented spot       (Forrest Class IIc) was found in the gastric antrum. A small spot of       bright red blood  was seen at the edge of the ulcer as well. The lesion       was 7 mm in largest dimension. Coagulation for bleeding prevention using       argon plasma was successful.      Patchy mildly erythematous mucosa without active bleeding and with no       stigmata of bleeding was found in the duodenal bulb.      The examined duodenum was normal. Impression:           - Normal esophagus.                       - Non-bleeding gastric ulcer with a flat pigmented spot                        (Forrest Class IIc). A small spot of bright red blood                        was seen at the edge of the ulcer as well. Treated with                        argon plasma coagulation (APC).                       - Normal examined duodenum.                       - No specimens collected due to patient's GI bleeding                        and anemia during this admission Recommendation:       - Return patient to hospital ward for ongoing care.                       - -Observe patient in the hospital for another 24-48                        hrs prior to discharge.                       -Discuss long term need for resuming dual antiplatelet                        therapy with cardiology.                       -If no further bleeding (as evidenced by no active                        bleeding and stable Hemoglobin), ok to resume                        antiplatelet therapy tomorrow.                       -  Continue Serial CBCs and transfuse PRN                       - Avoid NSAIDs except Aspirin if medically indicated                       - Use Protonix (pantoprazole) 40 mg PO BID for 8 weeks.                       - Return to my office in 2 weeks.                       - Return to primary care physician in 1 week.                       - The findings and recommendations were discussed with                        the patient.                       - Perform an H. pylori serology today. Procedure Code(s):    ---  Professional ---                       [phone], Esophagogastroduodenoscopy, flexible, transoral;                        with control of bleeding, any method Diagnosis Code(s):    --- Professional ---                       K25.9, Gastric ulcer, unspecified as acute or chronic,                        without hemorrhage or perforation                       D62, Acute posthemorrhagic anemia                       K92.1, Melena (includes Hematochezia) CPT copyright 2017 American Medical Association. All rights reserved. The codes documented in this report are preliminary and upon coder review may  be revised to meet current compliance requirements.  [name], MD Margretta Sidle B. Bonna Gains MD, MD 02/22/2018 11:16:22 AM This report has been signed electronically. Number of Addenda: 0 Note Initiated On: 02/22/2018 10:36 AM Estimated Blood Loss: Estimated blood loss: none.      [facility]

## 2018-02-22 NOTE — Anesthesia Post-op Follow-up Note (Signed)
Anesthesia QCDR form completed.        

## 2018-02-22 NOTE — Anesthesia Preprocedure Evaluation (Addendum)
Anesthesia Evaluation  Patient identified by MRN, date of birth, ID band Patient awake    Reviewed: Allergy & Precautions, NPO status , Patient's Chart, lab work & pertinent test results, reviewed documented beta blocker date and time   Airway Mallampati: II  TM Distance: >3 FB     Dental  (+) Chipped, Missing, Poor Dentition   Pulmonary shortness of breath, former smoker,           Cardiovascular hypertension, Pt. on medications and Pt. on home beta blockers + CAD, + Past MI, + Cardiac Stents and + Peripheral Vascular Disease  + Valvular Problems/Murmurs      Neuro/Psych TIA   GI/Hepatic   Endo/Other    Renal/GU Renal disease     Musculoskeletal  (+) Arthritis ,   Abdominal   Peds  Hematology  (+) anemia ,   Anesthesia Other Findings Denies stroke, had a TIA long time ago. EKG ok. Hb 7.7.  Reproductive/Obstetrics                            Anesthesia Physical Anesthesia Plan  ASA: III  Anesthesia Plan: General   Post-op Pain Management:    Induction: Intravenous  PONV Risk Score and Plan:   Airway Management Planned:   Additional Equipment:   Intra-op Plan:   Post-operative Plan:   Informed Consent: I have reviewed the patients History and Physical, chart, labs and discussed the procedure including the risks, benefits and alternatives for the proposed anesthesia with the patient or authorized representative who has indicated his/her understanding and acceptance.     Plan Discussed with: CRNA  Anesthesia Plan Comments:         Anesthesia Quick Evaluation

## 2018-02-22 NOTE — Transfer of Care (Signed)
Immediate Anesthesia Transfer of Care Note  Patient: Tonya Cabrera  Procedure(s) Performed: ESOPHAGOGASTRODUODENOSCOPY (EGD) WITH PROPOFOL (N/A )  Patient Location: PACU  Anesthesia Type:General  Level of Consciousness: awake and alert   Airway & Oxygen Therapy: Patient Spontanous Breathing  Post-op Assessment: Report given to RN  Post vital signs: Reviewed and stable  Last Vitals:  Vitals Value Taken Time  BP 127/64 02/22/2018 11:15 AM  Temp 37 C 02/22/2018 11:15 AM  Pulse 81 02/22/2018 11:15 AM  Resp 26 02/22/2018 11:15 AM  SpO2 96 % 02/22/2018 11:15 AM  Vitals shown include unvalidated device data.  Last Pain:  Vitals:   02/22/18 0845  TempSrc: Oral  PainSc:       Patients Stated Pain Goal: 0 (31/49/70 2637)  Complications: No apparent anesthesia complications

## 2018-02-22 NOTE — Discharge Summary (Signed)
[facility] [place] NAME: Tonya Cabrera    MR#:  [id number]  DATE OF BIRTH:  04-Dec-1950  DATE OF ADMISSION:  02/19/2018 ADMITTING PHYSICIAN: Idelle Crouch, MD  DATE OF DISCHARGE: 02/23/2018  PRIMARY CARE PHYSICIAN: Valerie Roys, DO    ADMISSION DIAGNOSIS:  SOB (shortness of breath) [R06.02] Gastrointestinal hemorrhage, unspecified gastrointestinal hemorrhage type [K92.2] Anemia, unspecified type [D64.9]  DISCHARGE DIAGNOSIS:  Principal Problem:   GI bleed Active Problems:   CAD (coronary artery disease)   Symptomatic anemia   Abdominal pain   GIB (gastrointestinal bleeding)   Melena   Acute posthemorrhagic anemia   Acute gastric ulcer without hemorrhage or perforation   SECONDARY DIAGNOSIS:   Past Medical History:  Diagnosis Date  . Arthritis   . Coronary artery disease    coronary stent  . Dyspnea   . Hard of hearing   . Heart murmur   . Hypercholesteremia   . Hypertension   . Myocardial infarction ([place])    2015  . Osteoporosis   . Stroke ([place])    10-12 years ago    HOSPITAL COURSE:  67 year old female with a history of CAD/stent on aspirin and Plavix who presents with melena.  1.  Melanotic stool in the setting of aspirin and Plavix EGD shows - Normal esophagus.                       - Non-bleeding gastric ulcer with a flat pigmented spot                        (Forrest Class IIc). A small spot of bright red blood                        was seen at the edge of the ulcer as well. Treated with                        argon plasma coagulation (APC).                       - Normal examined duodenum.                       - No specimens collected due to patient's GI bleeding                        and anemia during this admission Recommendation:    Avoid NSAIDs except Aspirin if medically indicated                       -Use Protonix (pantoprazole) 40 mg PO BID for 8 weeks.   SHe did not have blood  transfusion during hospital stay.   She will continue PPI BIDand follow-up with GI for in 2 weeks.    2.  CAD: Continue atorvastatin and lisinopril, metoprolol, Plavix She will have follow-up with cardiology in 4 days.  3.  Hyperlipidemia: Continue atorvastatin     DISCHARGE CONDITIONS AND DIET:   Stable on soft diet  CONSULTS OBTAINED:  Treatment Team:  Virgel Manifold, MD  DRUG ALLERGIES:  No Known Allergies  DISCHARGE MEDICATIONS:   Allergies as of 02/23/2018   No Known Allergies     Medication List    TAKE these medications   aspirin 81  MG EC tablet TAKE 1 TABLET (81 MG TOTAL) BY MOUTH DAILY.   atorvastatin 40 MG tablet Commonly known as:  LIPITOR Take 1 tablet (40 mg total) by mouth daily.   clopidogrel 75 MG tablet Commonly known as:  PLAVIX Take 1 tablet (75 mg total) by mouth daily.   cyclobenzaprine 10 MG tablet Commonly known as:  FLEXERIL Take 1 tablet (10 mg total) by mouth at bedtime.   Fish Oil 1000 MG Cpdr Take 1 capsule by mouth daily.   lisinopril 10 MG tablet Commonly known as:  PRINIVIL,ZESTRIL Take 1 tablet (10 mg total) by mouth daily.   metoprolol succinate 25 MG 24 hr tablet Commonly known as:  TOPROL-XL Take 1 tablet (25 mg total) by mouth 2 (two) times daily.   pantoprazole 40 MG tablet Commonly known as:  PROTONIX Take 1 tablet (40 mg total) by mouth 2 (two) times daily.   zolpidem 5 MG tablet Commonly known as:  AMBIEN TAKE 1 TABLET BY MOUTH AT BEDTIME AS NEEDED FOR SLEEP         Today   CHIEF COMPLAINT:   She has tearful this morning.  She feels that she may lose her house Denies abdominal pain or melena   VITAL SIGNS:  Blood pressure (!) 153/70, pulse 78, temperature 98.1 F (36.7 C), temperature source Oral, resp. rate 18, height 5\' 3"  (1.6 m), weight 62.5 kg (137 lb 12.6 oz), SpO2 100 %.   REVIEW OF SYSTEMS:  Review of Systems  Constitutional: Negative.  Negative for chills, fever and  malaise/fatigue.  HENT: Negative.  Negative for ear discharge, ear pain, hearing loss, nosebleeds and sore throat.   Eyes: Negative.  Negative for blurred vision and pain.  Respiratory: Negative.  Negative for cough, hemoptysis, shortness of breath and wheezing.   Cardiovascular: Negative.  Negative for chest pain, palpitations and leg swelling.  Gastrointestinal: Negative.  Negative for abdominal pain, blood in stool, diarrhea, nausea and vomiting.  Genitourinary: Negative.  Negative for dysuria.  Musculoskeletal: Negative.  Negative for back pain.  Skin: Negative.   Neurological: Negative for dizziness, tremors, speech change, focal weakness, seizures and headaches.  Endo/Heme/Allergies: Negative.  Does not bruise/bleed easily.  Psychiatric/Behavioral: Negative.  Negative for depression, hallucinations and suicidal ideas.     PHYSICAL EXAMINATION:  GENERAL:  67 y.o.-year-old patient lying in the bed with no acute distress.  NECK:  Supple, no jugular venous distention. No thyroid enlargement, no tenderness.  LUNGS: Normal breath sounds bilaterally, no wheezing, rales,rhonchi  No use of accessory muscles of respiration.  CARDIOVASCULAR: S1, S2 normal. No murmurs, rubs, or gallops.  ABDOMEN: Soft, non-tender, non-distended. Bowel sounds present. No organomegaly or mass.  EXTREMITIES: No pedal edema, cyanosis, or clubbing.  PSYCHIATRIC: The patient is alert and oriented x 3.  SKIN: No obvious rash, lesion, or ulcer.   DATA REVIEW:   CBC Recent Labs  Lab 02/23/18 0521  WBC 7.0  HGB 8.0*  HCT 22.5*  PLT 153    Chemistries  Recent Labs  Lab 02/20/18 0531  NA 143  K 3.5  CL 118*  CO2 19*  GLUCOSE 91  BUN 34*  CREATININE 1.07*  CALCIUM 8.0*  AST 19  ALT 12*  ALKPHOS 51  BILITOT 1.5*    Cardiac Enzymes No results for input(s): TROPONINI in the last 168 hours.  Microbiology Results  @MICRORSLT48 @  RADIOLOGY:  No results found.    Allergies as of 02/23/2018    No Known Allergies  Medication List    TAKE these medications   aspirin 81 MG EC tablet TAKE 1 TABLET (81 MG TOTAL) BY MOUTH DAILY.   atorvastatin 40 MG tablet Commonly known as:  LIPITOR Take 1 tablet (40 mg total) by mouth daily.   clopidogrel 75 MG tablet Commonly known as:  PLAVIX Take 1 tablet (75 mg total) by mouth daily.   cyclobenzaprine 10 MG tablet Commonly known as:  FLEXERIL Take 1 tablet (10 mg total) by mouth at bedtime.   Fish Oil 1000 MG Cpdr Take 1 capsule by mouth daily.   lisinopril 10 MG tablet Commonly known as:  PRINIVIL,ZESTRIL Take 1 tablet (10 mg total) by mouth daily.   metoprolol succinate 25 MG 24 hr tablet Commonly known as:  TOPROL-XL Take 1 tablet (25 mg total) by mouth 2 (two) times daily.   pantoprazole 40 MG tablet Commonly known as:  PROTONIX Take 1 tablet (40 mg total) by mouth 2 (two) times daily.   zolpidem 5 MG tablet Commonly known as:  AMBIEN TAKE 1 TABLET BY MOUTH AT BEDTIME AS NEEDED FOR SLEEP         Management plans discussed with the patient and she is in agreement. Stable for discharge home  Patient should follow up with pcp  CODE STATUS:     Code Status Orders  (From admission, onward)        Start     Ordered   02/19/18 1223  Full code  Continuous     02/19/18 1222    Code Status History    This patient has a current code status but no historical code status.      TOTAL TIME TAKING CARE OF THIS PATIENT: 38 minutes.    Note: This dictation was prepared with Dragon dictation along with smaller phrase technology. Any transcriptional errors that result from this process are unintentional.  Winnifred Dufford M.D on 02/23/2018 at 9:33 AM  Between 7am to 6pm - Pager - [phone] After 6pm go to [email or web address] - password [email or web address] [facility]  Office  [phone]  CC: Primary care physician; Valerie Roys, DO

## 2018-02-22 NOTE — Anesthesia Postprocedure Evaluation (Signed)
Anesthesia Post Note  Patient: Tonya Cabrera  Procedure(s) Performed: ESOPHAGOGASTRODUODENOSCOPY (EGD) WITH PROPOFOL (N/A )  Patient location during evaluation: Endoscopy Anesthesia Type: General Level of consciousness: awake and alert Pain management: pain level controlled Vital Signs Assessment: post-procedure vital signs reviewed and stable Respiratory status: spontaneous breathing, nonlabored ventilation, respiratory function stable and patient connected to nasal cannula oxygen Cardiovascular status: blood pressure returned to baseline and stable Postop Assessment: no apparent nausea or vomiting Anesthetic complications: no     Last Vitals:  Vitals:   02/22/18 1150 02/22/18 1219  BP: (!) 147/74 132/63  Pulse:  71  Resp:  16  Temp:  36.7 C  SpO2:      Last Pain:  Vitals:   02/22/18 1115  TempSrc: Tympanic  PainSc:                  [name]

## 2018-02-23 ENCOUNTER — Encounter: Payer: Self-pay | Admitting: Gastroenterology

## 2018-02-23 LAB — CBC
HEMATOCRIT: 22.5 % — AB (ref 35.0–47.0)
HEMOGLOBIN: 8 g/dL — AB (ref 12.0–16.0)
MCH: 32 pg (ref 26.0–34.0)
MCHC: 35.4 g/dL (ref 32.0–36.0)
MCV: 90.2 fL (ref 80.0–100.0)
Platelets: 153 10*3/uL (ref 150–440)
RBC: 2.5 MIL/uL — AB (ref 3.80–5.20)
RDW: 14.1 % (ref 11.5–14.5)
WBC: 7 10*3/uL (ref 3.6–11.0)

## 2018-02-23 LAB — OCCULT BLOOD X 1 CARD TO LAB, STOOL: Fecal Occult Bld: POSITIVE — AB

## 2018-02-23 MED ORDER — ESOMEPRAZOLE MAGNESIUM 40 MG PO CPDR: 40.0000 mg | DELAYED_RELEASE_CAPSULE | Freq: Every day | ORAL | 11 refills | Status: DC

## 2018-02-23 MED ORDER — CLOPIDOGREL BISULFATE 75 MG PO TABS
75.0000 mg | ORAL_TABLET | Freq: Every day | ORAL | Status: DC
  Administered 2018-02-23: 75 mg via ORAL
  Filled 2018-02-23: qty 1

## 2018-02-23 MED ORDER — PANTOPRAZOLE SODIUM 40 MG PO TBEC: 40.0000 mg | DELAYED_RELEASE_TABLET | Freq: Two times a day (BID) | ORAL | 0 refills | Status: DC

## 2018-02-23 NOTE — Progress Notes (Signed)
[name] to be D/C'd Home per MD order.  Discussed prescriptions and follow up appointments with the patient. Prescriptions given to patient, medication list explained in detail. Pt verbalized understanding.  Allergies as of 02/23/2018   No Known Allergies     Medication List    TAKE these medications   aspirin 81 MG EC tablet TAKE 1 TABLET (81 MG TOTAL) BY MOUTH DAILY.   atorvastatin 40 MG tablet Commonly known as:  LIPITOR Take 1 tablet (40 mg total) by mouth daily.   clopidogrel 75 MG tablet Commonly known as:  PLAVIX Take 1 tablet (75 mg total) by mouth daily.   cyclobenzaprine 10 MG tablet Commonly known as:  FLEXERIL Take 1 tablet (10 mg total) by mouth at bedtime.   Fish Oil 1000 MG Cpdr Take 1 capsule by mouth daily.   lisinopril 10 MG tablet Commonly known as:  PRINIVIL,ZESTRIL Take 1 tablet (10 mg total) by mouth daily.   metoprolol succinate 25 MG 24 hr tablet Commonly known as:  TOPROL-XL Take 1 tablet (25 mg total) by mouth 2 (two) times daily.   pantoprazole 40 MG tablet Commonly known as:  PROTONIX Take 1 tablet (40 mg total) by mouth 2 (two) times daily.   zolpidem 5 MG tablet Commonly known as:  AMBIEN TAKE 1 TABLET BY MOUTH AT BEDTIME AS NEEDED FOR SLEEP       Vitals:   02/23/18 0538 02/23/18 0754  BP: 133/60 (!) 153/70  Pulse: 78 78  Resp: 18 18  Temp: 98.2 F (36.8 C) 98.1 F (36.7 C)  SpO2: 93% 100%    Skin clean, dry and intact without evidence of skin break down, no evidence of skin tears noted. IV catheter discontinued intact. Site without signs and symptoms of complications. Dressing and pressure applied. Pt denies pain at this time. No complaints noted.  An After Visit Summary was printed and given to the patient. Patient refused wheelchair and stated she wanted to be escorted with family member and d/c via vehicle home   Sharalyn Ink

## 2018-02-23 NOTE — Care Management Important Message (Signed)
Copy of signed IM left in patient's room.    

## 2018-02-24 ENCOUNTER — Other Ambulatory Visit: Payer: Self-pay | Admitting: Family Medicine

## 2018-02-24 ENCOUNTER — Telehealth: Payer: Self-pay

## 2018-02-24 NOTE — Telephone Encounter (Signed)
Transition Care Management Follow-up Telephone Call   Date discharged?02/23/2018   How have you been since you were released from the hospital? "still very weak but in the middle of moving so I am trying my best"   Do you understand why you were in the hospital? yes   Do you understand the discharge instructions? yes   Where were you discharged to? home   Items Reviewed:  Medications reviewed: yes  Allergies reviewed: yes  Dietary changes reviewed: yes  Referrals reviewed: yes   Functional Questionnaire:   Activities of Daily Living (ADLs):   She states they are independent in the following: ambulation, bathing and hygiene, feeding, continence, grooming, toileting and dressing States they require assistance with the following: none   Any transportation issues/concerns?: no   Any patient concerns? Yes, concerned with some medications that could be causing the ulcers, she has stopped her naproxen as it was causing stomach aches.    Confirmed importance and date/time of follow-up visits scheduled yes  Provider Appointment booked with Dr.johnson on 03/04/2018 at 2:30pm   Confirmed with patient if condition begins to worsen call PCP or go to the ER.  Patient was given the office number and encouraged to call back with question or concerns.  : yes

## 2018-03-04 ENCOUNTER — Ambulatory Visit (INDEPENDENT_AMBULATORY_CARE_PROVIDER_SITE_OTHER): Payer: Medicare Other | Admitting: Family Medicine

## 2018-03-04 ENCOUNTER — Encounter: Payer: Self-pay | Admitting: Family Medicine

## 2018-03-04 VITALS — BP 158/78 | HR 79 | Wt 130.6 lb

## 2018-03-04 DIAGNOSIS — I700 Atherosclerosis of aorta: Secondary | ICD-10-CM | POA: Diagnosis not present

## 2018-03-04 DIAGNOSIS — K922 Gastrointestinal hemorrhage, unspecified: Secondary | ICD-10-CM

## 2018-03-04 DIAGNOSIS — K921 Melena: Secondary | ICD-10-CM

## 2018-03-04 DIAGNOSIS — D62 Acute posthemorrhagic anemia: Secondary | ICD-10-CM

## 2018-03-04 DIAGNOSIS — K253 Acute gastric ulcer without hemorrhage or perforation: Secondary | ICD-10-CM

## 2018-03-04 DIAGNOSIS — F339 Major depressive disorder, recurrent, unspecified: Secondary | ICD-10-CM | POA: Diagnosis not present

## 2018-03-04 DIAGNOSIS — I25119 Atherosclerotic heart disease of native coronary artery with unspecified angina pectoris: Secondary | ICD-10-CM

## 2018-03-04 DIAGNOSIS — F3340 Major depressive disorder, recurrent, in remission, unspecified: Secondary | ICD-10-CM | POA: Insufficient documentation

## 2018-03-04 LAB — CBC WITH DIFFERENTIAL/PLATELET
HEMATOCRIT: 27.5 % — AB (ref 34.0–46.6)
Hemoglobin: 9.2 g/dL — ABNORMAL LOW (ref 11.1–15.9)
Lymphocytes Absolute: 1.4 10*3/uL (ref 0.7–3.1)
Lymphs: 20 %
MCH: 31.6 pg (ref 26.6–33.0)
MCHC: 33.5 g/dL (ref 31.5–35.7)
MCV: 95 fL (ref 79–97)
MID (Absolute): 0.6 10*3/uL (ref 0.1–1.6)
MID: 9 %
NEUTROS ABS: 5.1 10*3/uL (ref 1.4–7.0)
Neutrophils: 72 %
Platelets: 257 10*3/uL (ref 150–450)
RBC: 2.91 x10E6/uL — AB (ref 3.77–5.28)
RDW: 16.6 % — ABNORMAL HIGH (ref 12.3–15.4)
WBC: 7.1 10*3/uL (ref 3.4–10.8)

## 2018-03-04 MED ORDER — SERTRALINE HCL 50 MG PO TABS: ORAL_TABLET | ORAL | 3 refills | Status: DC

## 2018-03-04 MED ORDER — FERROUS SULFATE 325 (65 FE) MG PO TABS: 325.0000 mg | ORAL_TABLET | Freq: Three times a day (TID) | ORAL | 3 refills | Status: DC

## 2018-03-04 MED ORDER — PANTOPRAZOLE SODIUM 40 MG PO TBEC: 40.0000 mg | DELAYED_RELEASE_TABLET | Freq: Two times a day (BID) | ORAL | 0 refills | Status: DC

## 2018-03-04 MED ORDER — HYDROXYZINE HCL 25 MG PO TABS: 25.0000 mg | ORAL_TABLET | Freq: Three times a day (TID) | ORAL | 3 refills | Status: DC | PRN

## 2018-03-04 NOTE — Assessment & Plan Note (Signed)
Hgb up to 9.2- but still feeling terrible. Will add iron and recheck in 1 month. Continue protonix and continue to follow with GI. Call with any concerns.

## 2018-03-04 NOTE — Assessment & Plan Note (Signed)
Will get her back into see cardiology. Referral generated today. Call with any concerns.

## 2018-03-04 NOTE — Assessment & Plan Note (Signed)
Not under good control. Will start zoloft and hydroxyzine. Recheck 1 month. Call with any concerns.

## 2018-03-04 NOTE — Progress Notes (Signed)
BP (!) 158/78 (BP Location: Right Arm, Patient Position: Sitting, Cuff Size: Normal)   Pulse 79   Wt 130 lb 9 oz (59.2 kg)   SpO2 100%   BMI 23.13 kg/m    Subjective:    Patient ID: Tonya Cabrera, female    DOB: 03/15/1951, 67 y.o.   MRN: [id number]  HPI: Tonya Cabrera is a 67 y.o. female  Chief Complaint  Patient presents with  . Hospitalization Follow-up   Transition of Care Hospital Follow up.   Hospital/Facility: [facility] D/C Physician: Bettey Costa, MD D/C Date: 02/23/18  Records Requested: 02/23/18 Records Received: 02/23/18 Records Reviewed: 02/24/18  Diagnoses on Discharge: GI bleed  Date of interactive Contact within 48 hours of discharge: 02/24/18 Contact was through: phone  Date of 7 day or 14 day face-to-face visit:  03/04/18  within 14 days      Outpatient Encounter Medications as of 03/04/2018  Medication Sig  . aspirin 81 MG EC tablet TAKE 1 TABLET (81 MG TOTAL) BY MOUTH DAILY.  Marland Kitchen atorvastatin (LIPITOR) 40 MG tablet Take 1 tablet (40 mg total) by mouth daily.  . clopidogrel (PLAVIX) 75 MG tablet Take 1 tablet (75 mg total) by mouth daily.  . cyclobenzaprine (FLEXERIL) 10 MG tablet Take 1 tablet (10 mg total) by mouth at bedtime.  Marland Kitchen lisinopril (PRINIVIL,ZESTRIL) 10 MG tablet Take 1 tablet (10 mg total) by mouth daily.  . metoprolol succinate (TOPROL-XL) 25 MG 24 hr tablet Take 1 tablet (25 mg total) by mouth 2 (two) times daily.  . Omega-3 Fatty Acids (FISH OIL) 1000 MG CPDR Take 1 capsule by mouth daily.   . pantoprazole (PROTONIX) 40 MG tablet Take 1 tablet (40 mg total) by mouth 2 (two) times daily.  Marland Kitchen zolpidem (AMBIEN) 5 MG tablet TAKE 1 TABLET BY MOUTH AT BEDTIME AS NEEDED FOR SLEEP   No facility-administered encounter medications on file as of 03/04/2018.    HOSPITAL COURSE:  67 year old female with a history of CAD/stent on aspirin and Plavix who presents with melena.  1. Melanotic stool in the setting of aspirin and Plavix EGD shows- Normal  esophagus. - Non-bleeding gastric ulcer with a flat pigmented spot  (Forrest Class IIc). A small spot of bright red blood  was seen at the edge of the ulcer as well. Treated with  argon plasma coagulation (APC). - Normal examined duodenum. - No specimens collected due to patient's GI bleeding  and anemia during this admission Recommendation: Avoid NSAIDs except Aspirin if medically indicated -Use Protonix (pantoprazole) 40 mg PO BID for 8 weeks.   She did not have bloodtransfusion during hospital stay.  She will continue PPIBIDand follow-up with GI forin 2 weeks.  2. CAD: Continue atorvastatin and lisinopril, metoprolol,Plavix She will have follow-up with cardiology in 4 days.  3. Hyperlipidemia: Continue atorvastatin   Diagnostic Tests Reviewed: EGD shows- Normal esophagus. - Non-bleeding gastric ulcer with a flat pigmented spot  (Forrest Class IIc). A small spot of bright red blood  was seen at the edge of the ulcer as well. Treated with  argon plasma coagulation (APC). - Normal examined duodenum. - No specimens collected due to patient's GI bleeding  and anemia during this admission Recommendation: Avoid NSAIDs except Aspirin if medically indicated -Use Protonix (pantoprazole) 40 mg PO BID for 8 weeks.  Disposition: Home  Consults: GI  Discharge Instructions: Follow up with GI, Cardiology and [place]  Disease/illness Education: In Writing  Home Health/Community Services Discussions/Referrals: N/A  Establishment or re-establishment  of referral orders for community resources: N/A  Discussion with other  health care providers: N/A  Assessment and Support of treatment regimen adherence: Good  Appointments Coordinated with: Patient   Education for self-management, independent living, and ADLs: In writing.  ANXIETY/DEPRESSION Duration:uncontrolled Anxious mood: yes  Excessive worrying: yes Irritability: yes  Sweating: no Nausea: no Palpitations:no Hyperventilation: no Panic attacks: yes Agoraphobia: yes  Obscessions/compulsions: yes Depressed mood: yes Depression screen [facility] 2/9 03/04/2018 06/02/2017 01/25/2017 06/08/2016  Decreased Interest 3 1 0 0  Down, Depressed, Hopeless 3 1 3  0  PHQ - 2 Score 6 2 3  0  Altered sleeping 3 3 3  -  Tired, decreased energy 3 0 0 -  Change in appetite 2 0 0 -  Feeling bad or failure about yourself  2 0 1 -  Trouble concentrating 3 1 2  -  Moving slowly or fidgety/restless 3 0 0 -  Suicidal thoughts 2 0 0 -  PHQ-9 Score 24 6 9  -  Difficult doing work/chores Extremely dIfficult Not difficult at all - -   Anhedonia: no Weight changes: no Insomnia: yes hard to fall asleep  Hypersomnia: no Fatigue/loss of energy: yes Feelings of worthlessness: yes Feelings of guilt: yes Impaired concentration/indecisiveness: yes Suicidal ideations: yes  Crying spells: yes Recent Stressors/Life Changes: yes   Relationship problems: no   Family stress: yes     Financial stress: yes    Job stress: yes    Recent death/loss: yes   Relevant past medical, surgical, family and social history reviewed and updated as indicated. Interim medical history since our last visit reviewed. Allergies and medications reviewed and updated.  Review of Systems  Constitutional: Negative.   Gastrointestinal: Positive for abdominal pain. Negative for abdominal distention, anal bleeding, blood in stool, constipation, diarrhea, nausea, rectal pain and vomiting.  Genitourinary: Negative.   Musculoskeletal: Negative.   Skin: Negative.   Neurological: Negative.     Psychiatric/Behavioral: Positive for dysphoric mood. Negative for agitation, behavioral problems, confusion, decreased concentration, hallucinations, self-injury, sleep disturbance and suicidal ideas. The patient is nervous/anxious. The patient is not hyperactive.     Per HPI unless specifically indicated above     Objective:    BP (!) 158/78 (BP Location: Right Arm, Patient Position: Sitting, Cuff Size: Normal)   Pulse 79   Wt 130 lb 9 oz (59.2 kg)   SpO2 100%   BMI 23.13 kg/m   Wt Readings from Last 3 Encounters:  03/04/18 130 lb 9 oz (59.2 kg)  02/22/18 137 lb 12.6 oz (62.5 kg)  01/31/18 133 lb (60.3 kg)    Physical Exam  Constitutional: She is oriented to person, place, and time. She appears well-developed and well-nourished. No distress.  HENT:  Head: Normocephalic and atraumatic.  Right Ear: Hearing normal.  Left Ear: Hearing normal.  Nose: Nose normal.  Eyes: Conjunctivae and lids are normal. Right eye exhibits no discharge. Left eye exhibits no discharge. No scleral icterus.  Cardiovascular: Normal rate, regular rhythm, normal heart sounds and intact distal pulses. Exam reveals no gallop and no friction rub.  No murmur heard. Pulmonary/Chest: Effort normal and breath sounds normal. No stridor. No respiratory distress. She has no wheezes. She has no rales. She exhibits no tenderness.  Musculoskeletal: Normal range of motion.  Neurological: She is alert and oriented to person, place, and time.  Skin: Skin is warm, dry and intact. Capillary refill takes less than 2 seconds. No rash noted. She is not diaphoretic. No erythema. No pallor.  Psychiatric:  She has a normal mood and affect. Her speech is normal and behavior is normal. Judgment and thought content normal. Cognition and memory are normal.  Nursing note and vitals reviewed.   Results for orders placed or performed during the hospital encounter of 02/19/18  Lipase, blood  Result Value Ref Range   Lipase 47 11 - 51  U/L  Comprehensive metabolic panel  Result Value Ref Range   Sodium 140 135 - 145 mmol/L   Potassium 4.6 3.5 - 5.1 mmol/L   Chloride 114 (H) 101 - 111 mmol/L   CO2 19 (L) 22 - 32 mmol/L   Glucose, Bld 141 (H) 65 - 99 mg/dL   BUN 71 (H) 6 - 20 mg/dL   Creatinine, Ser 1.06 (H) 0.44 - 1.00 mg/dL   Calcium 9.4 8.9 - 10.3 mg/dL   Total Protein 6.8 6.5 - 8.1 g/dL   Albumin 3.9 3.5 - 5.0 g/dL   AST 19 15 - 41 U/L   ALT 15 14 - 54 U/L   Alkaline Phosphatase 59 38 - 126 U/L   Total Bilirubin 2.0 (H) 0.3 - 1.2 mg/dL   GFR calc non Af Amer 53 (L) >60 mL/min   GFR calc Af Amer >60 >60 mL/min   Anion gap 7 5 - 15  CBC  Result Value Ref Range   WBC 17.2 (H) 3.6 - 11.0 K/uL   RBC 3.14 (L) 3.80 - 5.20 MIL/uL   Hemoglobin 9.9 (L) 12.0 - 16.0 g/dL   HCT 28.6 (L) 35.0 - 47.0 %   MCV 91.2 80.0 - 100.0 fL   MCH 31.4 26.0 - 34.0 pg   MCHC 34.5 32.0 - 36.0 g/dL   RDW 13.7 11.5 - 14.5 %   Platelets 311 150 - 440 K/uL  Urinalysis, Complete w Microscopic  Result Value Ref Range   Color, Urine YELLOW (A) YELLOW   APPearance CLEAR (A) CLEAR   Specific Gravity, Urine 1.020 1.005 - 1.030   pH 5.0 5.0 - 8.0   Glucose, UA NEGATIVE NEGATIVE mg/dL   Hgb urine dipstick MODERATE (A) NEGATIVE   Bilirubin Urine NEGATIVE NEGATIVE   Ketones, ur NEGATIVE NEGATIVE mg/dL   Protein, ur NEGATIVE NEGATIVE mg/dL   Nitrite NEGATIVE NEGATIVE   Leukocytes, UA SMALL (A) NEGATIVE   RBC / HPF 0-5 0 - 5 RBC/hpf   WBC, UA 0-5 0 - 5 WBC/hpf   Bacteria, UA RARE (A) NONE SEEN   Squamous Epithelial / LPF 0-5 0 - 5   Mucus PRESENT    Hyaline Casts, UA PRESENT   Protime-INR  Result Value Ref Range   Prothrombin Time 13.8 11.4 - 15.2 seconds   INR 1.07   HIV antibody (Routine Testing)  Result Value Ref Range   HIV Screen 4th Generation wRfx Non Reactive Non Reactive  Hemoglobin  Result Value Ref Range   Hemoglobin 8.5 (L) 12.0 - 16.0 g/dL  CBC  Result Value Ref Range   WBC 8.3 3.6 - 11.0 K/uL   RBC 2.51 (L) 3.80 -  5.20 MIL/uL   Hemoglobin 8.0 (L) 12.0 - 16.0 g/dL   HCT 23.0 (L) 35.0 - 47.0 %   MCV 91.6 80.0 - 100.0 fL   MCH 31.7 26.0 - 34.0 pg   MCHC 34.6 32.0 - 36.0 g/dL   RDW 14.0 11.5 - 14.5 %   Platelets 171 150 - 440 K/uL  Comprehensive metabolic panel  Result Value Ref Range   Sodium 143 135 - 145 mmol/L  Potassium 3.5 3.5 - 5.1 mmol/L   Chloride 118 (H) 101 - 111 mmol/L   CO2 19 (L) 22 - 32 mmol/L   Glucose, Bld 91 65 - 99 mg/dL   BUN 34 (H) 6 - 20 mg/dL   Creatinine, Ser 1.07 (H) 0.44 - 1.00 mg/dL   Calcium 8.0 (L) 8.9 - 10.3 mg/dL   Total Protein 6.0 (L) 6.5 - 8.1 g/dL   Albumin 3.5 3.5 - 5.0 g/dL   AST 19 15 - 41 U/L   ALT 12 (L) 14 - 54 U/L   Alkaline Phosphatase 51 38 - 126 U/L   Total Bilirubin 1.5 (H) 0.3 - 1.2 mg/dL   GFR calc non Af Amer 53 (L) >60 mL/min   GFR calc Af Amer >60 >60 mL/min   Anion gap 6 5 - 15  CBC  Result Value Ref Range   WBC 6.4 3.6 - 11.0 K/uL   RBC 2.54 (L) 3.80 - 5.20 MIL/uL   Hemoglobin 8.1 (L) 12.0 - 16.0 g/dL   HCT 23.3 (L) 35.0 - 47.0 %   MCV 91.5 80.0 - 100.0 fL   MCH 31.9 26.0 - 34.0 pg   MCHC 34.9 32.0 - 36.0 g/dL   RDW 14.2 11.5 - 14.5 %   Platelets 163 150 - 440 K/uL  Hemoglobin and hematocrit, blood  Result Value Ref Range   Hemoglobin 8.6 (L) 12.0 - 16.0 g/dL   HCT 24.8 (L) 35.0 - 47.0 %  Occult blood card to lab, stool  Result Value Ref Range   Fecal Occult Bld POSITIVE (A) NEGATIVE  CBC  Result Value Ref Range   WBC 7.4 3.6 - 11.0 K/uL   RBC 2.38 (L) 3.80 - 5.20 MIL/uL   Hemoglobin 7.7 (L) 12.0 - 16.0 g/dL   HCT 21.7 (L) 35.0 - 47.0 %   MCV 91.3 80.0 - 100.0 fL   MCH 32.4 26.0 - 34.0 pg   MCHC 35.5 32.0 - 36.0 g/dL   RDW 13.9 11.5 - 14.5 %   Platelets 153 150 - 440 K/uL  CBC  Result Value Ref Range   WBC 7.0 3.6 - 11.0 K/uL   RBC 2.50 (L) 3.80 - 5.20 MIL/uL   Hemoglobin 8.0 (L) 12.0 - 16.0 g/dL   HCT 22.5 (L) 35.0 - 47.0 %   MCV 90.2 80.0 - 100.0 fL   MCH 32.0 26.0 - 34.0 pg   MCHC 35.4 32.0 - 36.0 g/dL   RDW  14.1 11.5 - 14.5 %   Platelets 153 150 - 440 K/uL      Assessment & Plan:   Problem List Items Addressed This Visit      Cardiovascular and Mediastinum   CAD (coronary artery disease)    Will get her back into see cardiology. Referral generated today. Call with any concerns.       Relevant Orders   Ambulatory referral to Cardiology   Aortic atherosclerosis [facility])    Will get her back into see cardiology. Referral generated today. Call with any concerns.       Relevant Orders   Ambulatory referral to Cardiology     Digestive   GIB (gastrointestinal bleeding)    Hgb up to 9.2- but still feeling terrible. Will add iron and recheck in 1 month. Continue protonix and continue to follow with GI. Call with any concerns.       Melena - Primary    Hgb up to 9.2- but still feeling terrible. Will add iron and  recheck in 1 month. Continue protonix and continue to follow with GI. Call with any concerns.       Relevant Orders   CBC With Differential/Platelet   Acute gastric ulcer without hemorrhage or perforation    Hgb up to 9.2- but still feeling terrible. Will add iron and recheck in 1 month. Continue protonix and continue to follow with GI. Call with any concerns.         Other   Acute posthemorrhagic anemia    Hgb up to 9.2- but still feeling terrible. Will add iron and recheck in 1 month. Continue protonix and continue to follow with GI. Call with any concerns.       Relevant Medications   ferrous sulfate (FERROUSUL) 325 (65 FE) MG tablet   Depression, recurrent (HCC)    Not under good control. Will start zoloft and hydroxyzine. Recheck 1 month. Call with any concerns.       Relevant Medications   sertraline (ZOLOFT) 50 MG tablet   hydrOXYzine (ATARAX/VISTARIL) 25 MG tablet       Follow up plan: Return in about 1 month (around 04/01/2018).

## 2018-03-04 NOTE — Patient Instructions (Signed)
Gastrointestinal Bleeding Gastrointestinal (GI) bleeding is bleeding somewhere along the digestive tract, between the mouth and anus. This can be caused by various problems. The severity of these problems can range from mild to serious or even life-threatening. If you have GI bleeding, you may find blood in your stools (feces), you may have black stools, or you may vomit blood. If there is a lot of bleeding, you may need to stay in the hospital. What are the causes? This condition may be caused by:  Esophagitis. This is inflammation, irritation, or swelling of the esophagus.  Hemorrhoids.These are swollen veins in the rectum.  Anal fissures.These are areas of painful tearing that are often caused by passing hard stool.  Diverticulosis.These are pouches that form on the colon over time, with age, and may bleed a lot.  Diverticulitis.This is inflammation in areas with diverticulosis. It can cause pain, fever, and bloody stools, although bleeding may be mild.  Polyps and cancer. Colon cancer often starts out as precancerous polyps.  Gastritis and ulcers. With these, bleeding may come from the upper GI tract, near the stomach.  What are the signs or symptoms? Symptoms of this condition may include:  Bright red blood in your vomit, or vomit that looks like coffee grounds.  Bloody, black, or tarry stools. ? Bleeding from the lower GI tract will usually cause red or maroon blood in the stools. ? Bleeding from the upper GI tract may cause black, tarry, often bad-smelling stools. ? In certain cases, if the bleeding is fast enough, the stools may be red.  Pain or cramping in the abdomen.  How is this diagnosed? This condition may be diagnosed based on:  Medical history and physical exam.  Various tests, such as: ? Blood tests. ? X-rays and other imaging tests. ? Esophagogastroduodenoscopy (EGD). In this test, a flexible, lighted tube is used to look at your esophagus, stomach, and  small intestine. ? Colonoscopy. In this test, a flexible, lighted tube is used to look at your colon.  How is this treated? Treatment for this condition depends on the cause of the bleeding. For example:  For bleeding from the esophagus, stomach, small intestine, or colon, the health care provider doing your EGD or colonoscopy may be able to stop the bleeding as part of the procedure.  Inflammation or infection of the colon can be treated with medicines.  Certain rectal problems can be treated with creams, suppositories, or warm baths.  Surgery is sometimes needed.  Blood transfusions are sometimes needed if a lot of blood has been lost.  If bleeding is slow, you may be allowed to go home. If there is a lot of bleeding, you will need to stay in the hospital for observation. Follow these instructions at home:  Take over-the-counter and prescription medicines only as told by your health care provider.  Eat foods that are high in fiber. This will help to keep your stools soft. These foods include whole grains, legumes, fruits, and vegetables. Eating 1-3 prunes each day works well for many people.  Drink enough fluid to keep your urine clear or pale yellow.  Keep all follow-up visits as told by your health care provider. This is important. Contact a health care provider if:  Your symptoms do not improve. Get help right away if:  Your bleeding increases.  You feel light-headed or you faint.  You feel weak.  You have severe cramps in your back or abdomen.  You pass large blood clots in your stool.  Your symptoms are getting worse. This information is not intended to replace advice given to you by your health care provider. Make sure you discuss any questions you have with your health care provider. Document Released: 09/11/2000 Document Revised: 02/12/2016 Document Reviewed: 03/04/2015 Elsevier Interactive Patient Education  2018 [employer]. Sertraline tablets What is this  medicine? SERTRALINE (SER tra leen) is used to treat depression. It may also be used to treat obsessive compulsive disorder, panic disorder, post-trauma stress, premenstrual dysphoric disorder (PMDD) or social anxiety. This medicine may be used for other purposes; ask your health care provider or pharmacist if you have questions. COMMON BRAND NAME(S): Zoloft What should I tell my health care provider before I take this medicine? They need to know if you have any of these conditions: -bleeding disorders -bipolar disorder or a family history of bipolar disorder -glaucoma -heart disease -high blood pressure -history of irregular heartbeat -history of low levels of calcium, magnesium, or potassium in the blood -if you often drink alcohol -liver disease -receiving electroconvulsive therapy -seizures -suicidal thoughts, plans, or attempt; a previous suicide attempt by you or a family member -take medicines that treat or prevent blood clots -thyroid disease -an unusual or allergic reaction to sertraline, other medicines, foods, dyes, or preservatives -pregnant or trying to get pregnant -breast-feeding How should I use this medicine? Take this medicine by mouth with a glass of water. Follow the directions on the prescription label. You can take it with or without food. Take your medicine at regular intervals. Do not take your medicine more often than directed. Do not stop taking this medicine suddenly except upon the advice of your doctor. Stopping this medicine too quickly may cause serious side effects or your condition may worsen. A special MedGuide will be given to you by the pharmacist with each prescription and refill. Be sure to read this information carefully each time. Talk to your pediatrician regarding the use of this medicine in children. While this drug may be prescribed for children as young as 7 years for selected conditions, precautions do apply. Overdosage: If you think you have  taken too much of this medicine contact a poison control center or emergency room at once. NOTE: This medicine is only for you. Do not share this medicine with others. What if I miss a dose? If you miss a dose, take it as soon as you can. If it is almost time for your next dose, take only that dose. Do not take double or extra doses. What may interact with this medicine? Do not take this medicine with any of the following medications: -cisapride -dofetilide -dronedarone -linezolid -MAOIs like Carbex, Eldepryl, Marplan, Nardil, and Parnate -methylene blue (injected into a vein) -pimozide -thioridazine This medicine may also interact with the following medications: -alcohol -amphetamines -aspirin and aspirin-like medicines -certain medicines for depression, anxiety, or psychotic disturbances -certain medicines for fungal infections like ketoconazole, fluconazole, posaconazole, and itraconazole -certain medicines for irregular heart beat like flecainide, quinidine, propafenone -certain medicines for migraine headaches like almotriptan, eletriptan, frovatriptan, naratriptan, rizatriptan, sumatriptan, zolmitriptan -certain medicines for sleep -certain medicines for seizures like carbamazepine, valproic acid, phenytoin -certain medicines that treat or prevent blood clots like warfarin, enoxaparin, dalteparin -cimetidine -digoxin -diuretics -fentanyl -isoniazid -lithium -NSAIDs, medicines for pain and inflammation, like ibuprofen or naproxen -other medicines that prolong the QT interval (cause an abnormal heart rhythm) -rasagiline -safinamide -supplements like St. John's wort, kava kava, valerian -tolbutamide -tramadol -tryptophan This list may not describe all possible interactions. Give your  health care provider a list of all the medicines, herbs, non-prescription drugs, or dietary supplements you use. Also tell them if you smoke, drink alcohol, or use illegal drugs. Some items may  interact with your medicine. What should I watch for while using this medicine? Tell your doctor if your symptoms do not get better or if they get worse. Visit your doctor or health care professional for regular checks on your progress. Because it may take several weeks to see the full effects of this medicine, it is important to continue your treatment as prescribed by your doctor. Patients and their families should watch out for new or worsening thoughts of suicide or depression. Also watch out for sudden changes in feelings such as feeling anxious, agitated, panicky, irritable, hostile, aggressive, impulsive, severely restless, overly excited and hyperactive, or not being able to sleep. If this happens, especially at the beginning of treatment or after a change in dose, call your health care professional. Dennis Bast may get drowsy or dizzy. Do not drive, use machinery, or do anything that needs mental alertness until you know how this medicine affects you. Do not stand or sit up quickly, especially if you are an older patient. This reduces the risk of dizzy or fainting spells. Alcohol may interfere with the effect of this medicine. Avoid alcoholic drinks. Your mouth may get dry. Chewing sugarless gum or sucking hard candy, and drinking plenty of water may help. Contact your doctor if the problem does not go away or is severe. What side effects may I notice from receiving this medicine? Side effects that you should report to your doctor or health care professional as soon as possible: -allergic reactions like skin rash, itching or hives, swelling of the face, lips, or tongue -anxious -black, tarry stools -changes in vision -confusion -elevated mood, decreased need for sleep, racing thoughts, impulsive behavior -eye pain -fast, irregular heartbeat -feeling faint or lightheaded, falls -feeling agitated, angry, or irritable -hallucination, loss of contact with reality -loss of balance or  coordination -loss of memory -painful or prolonged erections -restlessness, pacing, inability to keep still -seizures -stiff muscles -suicidal thoughts or other mood changes -trouble sleeping -unusual bleeding or bruising -unusually weak or tired -vomiting Side effects that usually do not require medical attention (report to your doctor or health care professional if they continue or are bothersome): -change in appetite or weight -change in sex drive or performance -diarrhea -increased sweating -indigestion, nausea -tremors This list may not describe all possible side effects. Call your doctor for medical advice about side effects. You may report side effects to FDA at 1-800-FDA-1088. Where should I keep my medicine? Keep out of the reach of children. Store at room temperature between 15 and 30 degrees C (59 and 86 degrees F). Throw away any unused medicine after the expiration date. NOTE: This sheet is a summary. It may not cover all possible information. If you have questions about this medicine, talk to your doctor, pharmacist, or health care provider.  2018 Elsevier/Gold Standard (2016-09-18 14:17:49) Hydroxyzine capsules or tablets What is this medicine? HYDROXYZINE (hye McKenna i zeen) is an antihistamine. This medicine is used to treat allergy symptoms. It is also used to treat anxiety and tension. This medicine can be used with other medicines to induce sleep before surgery. This medicine may be used for other purposes; ask your health care provider or pharmacist if you have questions. COMMON BRAND NAME(S): ANX, Atarax, Rezine, Vistaril What should I tell my health care provider before I  take this medicine? They need to know if you have any of these conditions: -any chronic illness -difficulty passing urine -glaucoma -heart disease -kidney disease -liver disease -lung disease -an unusual or allergic reaction to hydroxyzine, cetirizine, other medicines, foods, dyes, or  preservatives -pregnant or trying to get pregnant -breast-feeding How should I use this medicine? Take this medicine by mouth with a full glass of water. Follow the directions on the prescription label. You may take this medicine with food or on an empty stomach. Take your medicine at regular intervals. Do not take your medicine more often than directed. Talk to your pediatrician regarding the use of this medicine in children. Special care may be needed. While this drug may be prescribed for children as young as 24 years of age for selected conditions, precautions do apply. Patients over 66 years old may have a stronger reaction and need a smaller dose. Overdosage: If you think you have taken too much of this medicine contact a poison control center or emergency room at once. NOTE: This medicine is only for you. Do not share this medicine with others. What if I miss a dose? If you miss a dose, take it as soon as you can. If it is almost time for your next dose, take only that dose. Do not take double or extra doses. What may interact with this medicine? -alcohol -barbiturate medicines for sleep or seizures -medicines for colds, allergies -medicines for depression, anxiety, or emotional disturbances -medicines for pain -medicines for sleep -muscle relaxants This list may not describe all possible interactions. Give your health care provider a list of all the medicines, herbs, non-prescription drugs, or dietary supplements you use. Also tell them if you smoke, drink alcohol, or use illegal drugs. Some items may interact with your medicine. What should I watch for while using this medicine? Tell your doctor or health care professional if your symptoms do not improve. You may get drowsy or dizzy. Do not drive, use machinery, or do anything that needs mental alertness until you know how this medicine affects you. Do not stand or sit up quickly, especially if you are an older patient. This reduces the  risk of dizzy or fainting spells. Alcohol may interfere with the effect of this medicine. Avoid alcoholic drinks. Your mouth may get dry. Chewing sugarless gum or sucking hard candy, and drinking plenty of water may help. Contact your doctor if the problem does not go away or is severe. This medicine may cause dry eyes and blurred vision. If you wear contact lenses you may feel some discomfort. Lubricating drops may help. See your eye doctor if the problem does not go away or is severe. If you are receiving skin tests for allergies, tell your doctor you are using this medicine. What side effects may I notice from receiving this medicine? Side effects that you should report to your doctor or health care professional as soon as possible: -fast or irregular heartbeat -difficulty passing urine -seizures -slurred speech or confusion -tremor Side effects that usually do not require medical attention (report to your doctor or health care professional if they continue or are bothersome): -constipation -drowsiness -fatigue -headache -stomach upset This list may not describe all possible side effects. Call your doctor for medical advice about side effects. You may report side effects to FDA at 1-800-FDA-1088. Where should I keep my medicine? Keep out of the reach of children. Store at room temperature between 15 and 30 degrees C (59 and 86 degrees F).  Keep container tightly closed. Throw away any unused medicine after the expiration date. NOTE: This sheet is a summary. It may not cover all possible information. If you have questions about this medicine, talk to your doctor, pharmacist, or health care provider.  2018 Elsevier/Gold Standard (2008-01-27 14:50:59)

## 2018-03-09 ENCOUNTER — Ambulatory Visit (INDEPENDENT_AMBULATORY_CARE_PROVIDER_SITE_OTHER): Payer: Medicare Other | Admitting: Gastroenterology

## 2018-03-09 ENCOUNTER — Encounter: Payer: Self-pay | Admitting: Gastroenterology

## 2018-03-09 VITALS — BP 165/76 | HR 60 | Ht 63.0 in | Wt 131.2 lb

## 2018-03-09 DIAGNOSIS — K253 Acute gastric ulcer without hemorrhage or perforation: Secondary | ICD-10-CM | POA: Diagnosis not present

## 2018-03-09 NOTE — Patient Instructions (Signed)
F/u 3 months 

## 2018-03-09 NOTE — Progress Notes (Signed)
[name], MD [address]  [place]  [place], [place] [address]  Main: [phone]  Fax: [phone]   Primary Care Physician: Valerie Roys, DO  Primary Gastroenterologist:  Dr. [name]  Chief Complaint  Patient presents with  . Hospitalization Follow-up    Patient states she feels tired and weak    HPI: Tonya Cabrera is a 67 y.o. female here for posthospitalization follow-up.  Patient was seen in GI consult on Feb 20, 2018 due to melena on aspirin and Plavix.  She underwent EGD on May 20, that showed gastric ulcer with flat pigmented spot in the gastric antrum.  It was treated with APC.  Patient has been on PPI twice daily since then.  Denies any abdominal pain, nausea or vomiting, melena, or hematochezia.  No heartburn or dysphagia.  Repeat lab work after hospitalization was done on June 7 by primary care provider, and showed hemoglobin of 9.2 which is improved from 8.0 at the time of hospital discharge.  Patient denies any NSAID use except her baby aspirin.   Current Outpatient Medications  Medication Sig Dispense Refill  . aspirin 81 MG EC tablet TAKE 1 TABLET (81 MG TOTAL) BY MOUTH DAILY. 90 tablet 4  . atorvastatin (LIPITOR) 40 MG tablet Take 1 tablet (40 mg total) by mouth daily. 90 tablet 1  . clopidogrel (PLAVIX) 75 MG tablet Take 1 tablet (75 mg total) by mouth daily. 90 tablet 1  . ferrous sulfate (FERROUSUL) 325 (65 FE) MG tablet Take 1 tablet (325 mg total) by mouth 3 (three) times daily with meals. 90 tablet 3  . hydrOXYzine (ATARAX/VISTARIL) 25 MG tablet Take 1 tablet (25 mg total) by mouth 3 (three) times daily as needed. 90 tablet 3  . lisinopril (PRINIVIL,ZESTRIL) 10 MG tablet Take 1 tablet (10 mg total) by mouth daily. 90 tablet 1  . metoprolol succinate (TOPROL-XL) 25 MG 24 hr tablet Take 1 tablet (25 mg total) by mouth 2 (two) times daily. 180 tablet 1  . pantoprazole (PROTONIX) 40 MG tablet Take 1 tablet (40 mg total) by mouth 2  (two) times daily. 120 tablet 0  . sertraline (ZOLOFT) 50 MG tablet 1 tab daily for 1 week, then increase to 2 tabs daily 60 tablet 3  . zolpidem (AMBIEN) 5 MG tablet TAKE 1 TABLET BY MOUTH AT BEDTIME AS NEEDED FOR SLEEP 90 tablet 0  . Omega-3 Fatty Acids (FISH OIL) 1000 MG CPDR Take 1 capsule by mouth daily.      No current facility-administered medications for this visit.     Allergies as of 03/09/2018  . (No Known Allergies)    ROS:  General: Negative for anorexia, weight loss, fever, chills, fatigue, weakness. ENT: Negative for hoarseness, difficulty swallowing , nasal congestion. CV: Negative for chest pain, angina, palpitations, dyspnea on exertion, peripheral edema.  Respiratory: Negative for dyspnea at rest, dyspnea on exertion, cough, sputum, wheezing.  GI: See history of present illness. GU:  Negative for dysuria, hematuria, urinary incontinence, urinary frequency, nocturnal urination.  Endo: Negative for unusual weight change.    Physical Examination:   BP (!) 165/76   Pulse 60   Ht 5\' 3"  (1.6 m)   Wt 131 lb 3.2 oz (59.5 kg)   BMI 23.24 kg/m   General: Well-nourished, well-developed in no acute distress.  Eyes: No icterus. Conjunctivae pink. Mouth: Oropharyngeal mucosa moist and pink , no lesions erythema or exudate. Neck: Supple, Trachea midline Abdomen: Bowel sounds are normal, nontender,  nondistended, no hepatosplenomegaly or masses, no abdominal bruits or hernia , no rebound or guarding.   Extremities: No lower extremity edema. No clubbing or deformities. Neuro: Alert and oriented x 3.  Grossly intact. Skin: Warm and dry, no jaundice.   Psych: Alert and cooperative, normal mood and affect.   Labs: CMP     Component Value Date/Time   NA 143 02/20/2018 0531   NA 142 12/23/2017 1352   NA 144 05/16/2014 0020   K 3.5 02/20/2018 0531   K 3.9 05/16/2014 0020   CL 118 (H) 02/20/2018 0531   CL 109 (H) 05/16/2014 0020   CO2 19 (L) 02/20/2018 0531   CO2 23  05/16/2014 0020   GLUCOSE 91 02/20/2018 0531   GLUCOSE 121 (H) 05/16/2014 0020   BUN 34 (H) 02/20/2018 0531   BUN 14 12/23/2017 1352   BUN 9 05/16/2014 0020   CREATININE 1.07 (H) 02/20/2018 0531   CREATININE 0.90 05/16/2014 0020   CALCIUM 8.0 (L) 02/20/2018 0531   CALCIUM 8.2 (L) 05/16/2014 0020   PROT 6.0 (L) 02/20/2018 0531   PROT 7.0 12/23/2017 1352   PROT 7.5 05/15/2014 0842   ALBUMIN 3.5 02/20/2018 0531   ALBUMIN 4.6 12/23/2017 1352   ALBUMIN 4.1 05/15/2014 0842   AST 19 02/20/2018 0531   AST 89 (H) 05/15/2014 0842   ALT 12 (L) 02/20/2018 0531   ALT 22 05/15/2014 0842   ALKPHOS 51 02/20/2018 0531   ALKPHOS 108 05/15/2014 0842   BILITOT 1.5 (H) 02/20/2018 0531   BILITOT 1.9 (H) 12/23/2017 1352   BILITOT 1.0 05/15/2014 0842   GFRNONAA 53 (L) 02/20/2018 0531   GFRNONAA >60 05/16/2014 0020   GFRAA >60 02/20/2018 0531   GFRAA >60 05/16/2014 0020   Lab Results  Component Value Date   WBC 7.1 03/04/2018   HGB 9.2 (L) 03/04/2018   HCT 27.5 (L) 03/04/2018   MCV 95 03/04/2018   PLT 257 03/04/2018    Imaging Studies: Dg Chest Port 1 View  Result Date: 02/19/2018 CLINICAL DATA:  Shortness of breath. EXAM: PORTABLE CHEST 1 VIEW COMPARISON:  Radiograph of May 15, 2014. FINDINGS: The heart size and mediastinal contours are within normal limits. Both lungs are clear. No pneumothorax or pleural effusion is noted. The visualized skeletal structures are unremarkable. IMPRESSION: No acute cardiopulmonary abnormality seen. Electronically Signed   By: Marijo Conception, M.D.   On: 02/19/2018 11:49    Assessment and Plan:   Tonya Cabrera is a 67 y.o. y/o female with hospitalization for melena on Feb 20, 2018, and EGD on May 28 showing gastric ulcer with pigmented spot treat it with APC, now on PPI twice daily and doing well, with no melena or hematochezia or abdominal pain, and currently on aspirin and Plavix for history of CAD  Continue to avoid any other NSAIDs besides baby  aspirin I messaged her primary care provider to find out if patient needs to be continued on dual antiplatelet therapy.  I see as per Dr. Bard Herbert last note, cardiology referral was generated, and daily Need to ask that question to cardiology directly.  If aspirin needs to be continued, patient should be on PPI therapy indefinitely If aspirin does not need to be continued, and patient can be on Plavix by itself, PPI can be discontinued after her gastric ulcer heals  We will check H. pylori serology Continue PPI twice daily (Risks of PPI use were discussed with patient including bone loss, C. Diff diarrhea, pneumonia, infections,  CKD, electrolyte abnormalities.  If clinically possible based on symptoms, goal would be to maintain patient on the lowest dose possible, or discontinue the medication with institution of acid reflux lifestyle modifications over time. Pt. Verbalizes understanding and chooses to continue the medication.)  Repeat EGD in 3 months for gastric biopsies and reevaluation of the ulcer site  As far as colorectal cancer screening is concerned, patient states she does Cologuard testing with her primary care provider.  And last testing was a year ago and was negative.  She can continue this with her primary care provider, she does not want a colonoscopy at this time.   Dr [name]

## 2018-03-10 ENCOUNTER — Other Ambulatory Visit: Payer: Self-pay

## 2018-03-10 DIAGNOSIS — K253 Acute gastric ulcer without hemorrhage or perforation: Secondary | ICD-10-CM

## 2018-03-14 ENCOUNTER — Other Ambulatory Visit: Payer: Self-pay

## 2018-03-14 DIAGNOSIS — K259 Gastric ulcer, unspecified as acute or chronic, without hemorrhage or perforation: Secondary | ICD-10-CM

## 2018-03-16 ENCOUNTER — Telehealth: Payer: Self-pay

## 2018-03-16 NOTE — Telephone Encounter (Signed)
Pt notified that she was approved to be off of her Plavix 7 days prior to procedure and restart 7 days after procedure.

## 2018-03-22 ENCOUNTER — Telehealth: Payer: Self-pay | Admitting: Family Medicine

## 2018-03-22 NOTE — Telephone Encounter (Signed)
Patient has not called to schedule appointment with Dr. Clayborn Bigness- please get her to call and schedule appointment with Dr. Clayborn Bigness. Thanks!

## 2018-03-23 NOTE — Telephone Encounter (Signed)
Appointment scheduled Wednesday with Dr. Clayborn Bigness.

## 2018-03-24 NOTE — Telephone Encounter (Signed)
Patient notified

## 2018-03-26 ENCOUNTER — Other Ambulatory Visit: Payer: Self-pay | Admitting: Family Medicine

## 2018-03-30 DIAGNOSIS — I208 Other forms of angina pectoris: Secondary | ICD-10-CM | POA: Diagnosis not present

## 2018-03-30 DIAGNOSIS — R011 Cardiac murmur, unspecified: Secondary | ICD-10-CM | POA: Diagnosis not present

## 2018-03-30 DIAGNOSIS — I25119 Atherosclerotic heart disease of native coronary artery with unspecified angina pectoris: Secondary | ICD-10-CM | POA: Diagnosis not present

## 2018-04-04 ENCOUNTER — Ambulatory Visit: Payer: Medicare Other | Admitting: Family Medicine

## 2018-04-04 ENCOUNTER — Other Ambulatory Visit: Payer: Self-pay

## 2018-04-18 DIAGNOSIS — I208 Other forms of angina pectoris: Secondary | ICD-10-CM | POA: Diagnosis not present

## 2018-04-18 DIAGNOSIS — I25119 Atherosclerotic heart disease of native coronary artery with unspecified angina pectoris: Secondary | ICD-10-CM | POA: Diagnosis not present

## 2018-04-18 DIAGNOSIS — R011 Cardiac murmur, unspecified: Secondary | ICD-10-CM | POA: Diagnosis not present

## 2018-04-26 DIAGNOSIS — E785 Hyperlipidemia, unspecified: Secondary | ICD-10-CM | POA: Diagnosis not present

## 2018-04-26 DIAGNOSIS — I10 Essential (primary) hypertension: Secondary | ICD-10-CM | POA: Diagnosis not present

## 2018-04-26 DIAGNOSIS — I208 Other forms of angina pectoris: Secondary | ICD-10-CM | POA: Diagnosis not present

## 2018-04-26 DIAGNOSIS — R011 Cardiac murmur, unspecified: Secondary | ICD-10-CM | POA: Diagnosis not present

## 2018-04-26 DIAGNOSIS — I25119 Atherosclerotic heart disease of native coronary artery with unspecified angina pectoris: Secondary | ICD-10-CM | POA: Diagnosis not present

## 2018-05-06 ENCOUNTER — Other Ambulatory Visit: Payer: Self-pay | Admitting: Family Medicine

## 2018-06-02 ENCOUNTER — Other Ambulatory Visit: Payer: Self-pay | Admitting: Family Medicine

## 2018-06-06 ENCOUNTER — Ambulatory Visit: Payer: Self-pay

## 2018-06-08 ENCOUNTER — Ambulatory Visit
Admission: RE | Admit: 2018-06-08 | Discharge: 2018-06-08 | Disposition: A | Discharge status: Home / Self Care | Payer: Medicare Other | Source: Ambulatory Visit | Attending: Gastroenterology | Admitting: Gastroenterology

## 2018-06-08 ENCOUNTER — Encounter: Payer: Self-pay | Admitting: *Deleted

## 2018-06-08 ENCOUNTER — Ambulatory Visit: Payer: Medicare Other | Admitting: Anesthesiology

## 2018-06-08 ENCOUNTER — Encounter
Admission: RE | Disposition: A | Discharge status: Home / Self Care | Payer: Self-pay | Source: Ambulatory Visit | Attending: Gastroenterology

## 2018-06-08 DIAGNOSIS — Z8673 Personal history of transient ischemic attack (TIA), and cerebral infarction without residual deficits: Secondary | ICD-10-CM | POA: Insufficient documentation

## 2018-06-08 DIAGNOSIS — R0600 Dyspnea, unspecified: Secondary | ICD-10-CM | POA: Diagnosis not present

## 2018-06-08 DIAGNOSIS — K2950 Unspecified chronic gastritis without bleeding: Secondary | ICD-10-CM | POA: Insufficient documentation

## 2018-06-08 DIAGNOSIS — M1990 Unspecified osteoarthritis, unspecified site: Secondary | ICD-10-CM | POA: Diagnosis not present

## 2018-06-08 DIAGNOSIS — Z87891 Personal history of nicotine dependence: Secondary | ICD-10-CM | POA: Insufficient documentation

## 2018-06-08 DIAGNOSIS — I252 Old myocardial infarction: Secondary | ICD-10-CM | POA: Insufficient documentation

## 2018-06-08 DIAGNOSIS — Z7982 Long term (current) use of aspirin: Secondary | ICD-10-CM | POA: Insufficient documentation

## 2018-06-08 DIAGNOSIS — Z8249 Family history of ischemic heart disease and other diseases of the circulatory system: Secondary | ICD-10-CM | POA: Insufficient documentation

## 2018-06-08 DIAGNOSIS — I10 Essential (primary) hypertension: Secondary | ICD-10-CM | POA: Diagnosis not present

## 2018-06-08 DIAGNOSIS — K3189 Other diseases of stomach and duodenum: Secondary | ICD-10-CM

## 2018-06-08 DIAGNOSIS — M810 Age-related osteoporosis without current pathological fracture: Secondary | ICD-10-CM | POA: Insufficient documentation

## 2018-06-08 DIAGNOSIS — E785 Hyperlipidemia, unspecified: Secondary | ICD-10-CM | POA: Diagnosis not present

## 2018-06-08 DIAGNOSIS — K279 Peptic ulcer, site unspecified, unspecified as acute or chronic, without hemorrhage or perforation: Secondary | ICD-10-CM | POA: Diagnosis not present

## 2018-06-08 DIAGNOSIS — F329 Major depressive disorder, single episode, unspecified: Secondary | ICD-10-CM | POA: Diagnosis not present

## 2018-06-08 DIAGNOSIS — K2900 Acute gastritis without bleeding: Secondary | ICD-10-CM | POA: Diagnosis not present

## 2018-06-08 DIAGNOSIS — K253 Acute gastric ulcer without hemorrhage or perforation: Secondary | ICD-10-CM

## 2018-06-08 DIAGNOSIS — R011 Cardiac murmur, unspecified: Secondary | ICD-10-CM | POA: Diagnosis not present

## 2018-06-08 DIAGNOSIS — Z955 Presence of coronary angioplasty implant and graft: Secondary | ICD-10-CM | POA: Diagnosis not present

## 2018-06-08 DIAGNOSIS — Z79899 Other long term (current) drug therapy: Secondary | ICD-10-CM | POA: Insufficient documentation

## 2018-06-08 DIAGNOSIS — Z961 Presence of intraocular lens: Secondary | ICD-10-CM | POA: Diagnosis not present

## 2018-06-08 DIAGNOSIS — Z9841 Cataract extraction status, right eye: Secondary | ICD-10-CM | POA: Diagnosis not present

## 2018-06-08 DIAGNOSIS — Z800 Family history of malignant neoplasm of digestive organs: Secondary | ICD-10-CM | POA: Diagnosis not present

## 2018-06-08 DIAGNOSIS — H9190 Unspecified hearing loss, unspecified ear: Secondary | ICD-10-CM | POA: Diagnosis not present

## 2018-06-08 DIAGNOSIS — I739 Peripheral vascular disease, unspecified: Secondary | ICD-10-CM | POA: Diagnosis not present

## 2018-06-08 DIAGNOSIS — I2510 Atherosclerotic heart disease of native coronary artery without angina pectoris: Secondary | ICD-10-CM | POA: Insufficient documentation

## 2018-06-08 DIAGNOSIS — K259 Gastric ulcer, unspecified as acute or chronic, without hemorrhage or perforation: Secondary | ICD-10-CM | POA: Insufficient documentation

## 2018-06-08 DIAGNOSIS — Z9842 Cataract extraction status, left eye: Secondary | ICD-10-CM | POA: Insufficient documentation

## 2018-06-08 DIAGNOSIS — E7800 Pure hypercholesterolemia, unspecified: Secondary | ICD-10-CM | POA: Diagnosis not present

## 2018-06-08 HISTORY — PX: ESOPHAGOGASTRODUODENOSCOPY (EGD) WITH PROPOFOL: SHX5813

## 2018-06-08 SURGERY — ESOPHAGOGASTRODUODENOSCOPY (EGD) WITH PROPOFOL
Anesthesia: General

## 2018-06-08 MED ORDER — PROPOFOL 500 MG/50ML IV EMUL
INTRAVENOUS | Status: AC
  Filled 2018-06-08: qty 50

## 2018-06-08 MED ORDER — PROPOFOL 500 MG/50ML IV EMUL
INTRAVENOUS | Status: DC | PRN
  Administered 2018-06-08: 100 ug/kg/min via INTRAVENOUS

## 2018-06-08 MED ORDER — MIDAZOLAM HCL 2 MG/2ML IJ SOLN
INTRAMUSCULAR | Status: DC | PRN
  Administered 2018-06-08: 1 mg via INTRAVENOUS

## 2018-06-08 MED ORDER — LIDOCAINE HCL (CARDIAC) PF 100 MG/5ML IV SOSY
PREFILLED_SYRINGE | INTRAVENOUS | Status: DC | PRN
  Administered 2018-06-08: 30 mg via INTRAVENOUS

## 2018-06-08 MED ORDER — MIDAZOLAM HCL 2 MG/2ML IJ SOLN
INTRAMUSCULAR | Status: AC
  Filled 2018-06-08: qty 2

## 2018-06-08 MED ORDER — LIDOCAINE HCL (PF) 2 % IJ SOLN
INTRAMUSCULAR | Status: AC
  Filled 2018-06-08: qty 10

## 2018-06-08 MED ORDER — SODIUM CHLORIDE 0.9 % IV SOLN
INTRAVENOUS | Status: DC
  Administered 2018-06-08: 1000 mL via INTRAVENOUS

## 2018-06-08 MED ORDER — FENTANYL CITRATE (PF) 100 MCG/2ML IJ SOLN
INTRAMUSCULAR | Status: AC
  Filled 2018-06-08: qty 2

## 2018-06-08 MED ORDER — FENTANYL CITRATE (PF) 100 MCG/2ML IJ SOLN
INTRAMUSCULAR | Status: DC | PRN
  Administered 2018-06-08: 50 ug via INTRAVENOUS

## 2018-06-08 NOTE — Anesthesia Preprocedure Evaluation (Signed)
Anesthesia Evaluation  Patient identified by MRN, date of birth, ID band Patient awake    Reviewed: Allergy & Precautions, H&P , NPO status , Patient's Chart, lab work & pertinent test results  History of Anesthesia Complications Negative for: history of anesthetic complications  Airway Mallampati: III  TM Distance: <3 FB Neck ROM: limited    Dental  (+) Chipped, Poor Dentition   Pulmonary shortness of breath and with exertion, former smoker,           Cardiovascular Exercise Tolerance: Good hypertension, (-) angina+ CAD, + Past MI, + Cardiac Stents and + Peripheral Vascular Disease  (-) DOE + Valvular Problems/Murmurs      Neuro/Psych PSYCHIATRIC DISORDERS Depression TIAnegative neurological ROS     GI/Hepatic negative GI ROS, Neg liver ROS, PUD,   Endo/Other  negative endocrine ROS  Renal/GU Renal disease  negative genitourinary   Musculoskeletal  (+) Arthritis ,   Abdominal   Peds  Hematology negative hematology ROS (+)   Anesthesia Other Findings Past Medical History: No date: Arthritis No date: Coronary artery disease     Comment:  coronary stent No date: Dyspnea No date: Hard of hearing No date: Heart murmur No date: Hypercholesteremia No date: Hypertension No date: Myocardial infarction [place])     Comment:  2015 No date: Osteoporosis No date: Stroke [facility])     Comment:  10-12 years ago  Past Surgical History: No date: CAROTID STENT 11/09/2017: CATARACT EXTRACTION W/PHACO; Right     Comment:  Procedure: CATARACT EXTRACTION PHACO AND INTRAOCULAR               LENS PLACEMENT (IOC) RIGHT;  Surgeon: Eulogio Bear,              MD;  Location: [facility];  Service:               Ophthalmology;  Laterality: Right; 11/23/2017: CATARACT EXTRACTION W/PHACO; Left     Comment:  Procedure: CATARACT EXTRACTION PHACO AND INTRAOCULAR               LENS PLACEMENT ([place]) LEFT;  Surgeon: Eulogio Bear,               MD;  Location: [facility];  Service:               Ophthalmology;  Laterality: Left; 02/22/2018: ESOPHAGOGASTRODUODENOSCOPY (EGD) WITH PROPOFOL; N/A     Comment:  Procedure: ESOPHAGOGASTRODUODENOSCOPY (EGD) WITH               PROPOFOL;  Surgeon: Virgel Manifold, MD;  Location:               [facility] ENDOSCOPY;  Service: Endoscopy;  Laterality: N/A; No date: INCONTINENCE SURGERY No date: THYROID SURGERY  BMI    Body Mass Index:  24.27 kg/m      Reproductive/Obstetrics negative OB ROS                             Anesthesia Physical Anesthesia Plan  ASA: III  Anesthesia Plan: General   Post-op Pain Management:    Induction: Intravenous  PONV Risk Score and Plan: Propofol infusion and TIVA  Airway Management Planned: Natural Airway and Nasal Cannula  Additional Equipment:   Intra-op Plan:   Post-operative Plan:   Informed Consent: I have reviewed the patients History and Physical, chart, labs and discussed the procedure including the risks, benefits and alternatives for  the proposed anesthesia with the patient or authorized representative who has indicated his/her understanding and acceptance.   Dental Advisory Given  Plan Discussed with: Anesthesiologist, CRNA and Surgeon  Anesthesia Plan Comments: (Patient consented for risks of anesthesia including but not limited to:  - adverse reactions to medications - risk of intubation if required - damage to teeth, lips or other oral mucosa - sore throat or hoarseness - Damage to heart, brain, lungs or loss of life  Patient voiced understanding.)        Anesthesia Quick Evaluation

## 2018-06-08 NOTE — Transfer of Care (Signed)
Immediate Anesthesia Transfer of Care Note  Patient: Tonya Cabrera  Procedure(s) Performed: ESOPHAGOGASTRODUODENOSCOPY (EGD) WITH PROPOFOL (N/A )  Patient Location: PACU  Anesthesia Type:General  Level of Consciousness: awake and sedated  Airway & Oxygen Therapy: Patient Spontanous Breathing and Patient connected to nasal cannula oxygen  Post-op Assessment: Report given to RN and Post -op Vital signs reviewed and stable  Post vital signs: Reviewed and stable  Last Vitals:  Vitals Value Taken Time  BP    Temp    Pulse 60 06/08/2018  9:53 AM  Resp 18 06/08/2018  9:53 AM  SpO2 98 % 06/08/2018  9:53 AM    Last Pain:  Vitals:   06/08/18 0953  TempSrc:   PainSc: 0-No pain         Complications: No apparent anesthesia complications

## 2018-06-08 NOTE — H&P (Signed)
Tonya Antigua, MD [address] [place], [place], [place], [address] [address], [place], [place], [place], [address] Phone: [phone]  Fax: [phone]  Primary Care Physician:  Valerie Roys, DO   Pre-Procedure History & Physical: HPI:  Tonya Cabrera is a 67 y.o. female is here for an EGD.   Past Medical History:  Diagnosis Date  . Arthritis   . Coronary artery disease    coronary stent  . Dyspnea   . Hard of hearing   . Heart murmur   . Hypercholesteremia   . Hypertension   . Myocardial infarction ([place])    2015  . Osteoporosis   . Stroke [facility])    10-12 years ago    Past Surgical History:  Procedure Laterality Date  . CAROTID STENT    . CATARACT EXTRACTION W/PHACO Right 11/09/2017   Procedure: CATARACT EXTRACTION PHACO AND INTRAOCULAR LENS PLACEMENT ([place]) RIGHT;  Surgeon: Eulogio Bear, MD;  Location: [facility];  Service: Ophthalmology;  Laterality: Right;  . CATARACT EXTRACTION W/PHACO Left 11/23/2017   Procedure: CATARACT EXTRACTION PHACO AND INTRAOCULAR LENS PLACEMENT ([place]) LEFT;  Surgeon: Eulogio Bear, MD;  Location: [facility];  Service: Ophthalmology;  Laterality: Left;  . ESOPHAGOGASTRODUODENOSCOPY (EGD) WITH PROPOFOL N/A 02/22/2018   Procedure: ESOPHAGOGASTRODUODENOSCOPY (EGD) WITH PROPOFOL;  Surgeon: Virgel Manifold, MD;  Location: [facility] ENDOSCOPY;  Service: Endoscopy;  Laterality: N/A;  . INCONTINENCE SURGERY    . THYROID SURGERY      Prior to Admission medications   Medication Sig Start Date End Date Taking? Authorizing Provider  atorvastatin (LIPITOR) 40 MG tablet Take 1 tablet (40 mg total) by mouth daily. 12/23/17  Yes Johnson, Megan P, DO  lisinopril (PRINIVIL,ZESTRIL) 10 MG tablet Take 1 tablet (10 mg total) by mouth daily. 12/23/17  Yes Johnson, Megan P, DO  metoprolol succinate (TOPROL-XL) 25 MG 24 hr tablet Take 1 tablet (25 mg total) by mouth 2 (two) times daily. 12/23/17  Yes Johnson, Megan P, DO    pantoprazole (PROTONIX) 40 MG tablet TAKE 1 TABLET BY MOUTH TWICE A DAY 06/02/18  Yes Johnson, Megan P, DO  alendronate (FOSAMAX) 70 MG tablet TAKE ONE TABLET BY MOUTH EVERY 7 DAYS ON EMPTY STOMACH WITH FULL GLASS OF WATER 03/19/18   [provider]  aspirin 81 MG EC tablet TAKE 1 TABLET (81 MG TOTAL) BY MOUTH DAILY. 12/23/17   Johnson, Megan P, DO  ferrous sulfate (FERROUSUL) 325 (65 FE) MG tablet Take 1 tablet (325 mg total) by mouth 3 (three) times daily with meals. 03/04/18   Johnson, Megan P, DO  hydrOXYzine (ATARAX/VISTARIL) 25 MG tablet TAKE 1 TABLET BY MOUTH THREE TIMES A DAY AS NEEDED 03/28/18   Johnson, Megan P, DO  Omega-3 Fatty Acids (FISH OIL) 1000 MG CPDR Take 1 capsule by mouth daily.  12/23/17   Johnson, Megan P, DO  sertraline (ZOLOFT) 50 MG tablet 1 TAB DAILY FOR 1 WEEK, THEN INCREASE TO 2 TABS DAILY 03/28/18   Johnson, Megan P, DO  zolpidem (AMBIEN) 5 MG tablet TAKE 1 TABLET BY MOUTH AT BEDTIME AS NEEDED FOR SLEEP 02/07/18   Laverle Hobby, MD    Allergies as of 03/10/2018  . (No Known Allergies)    Family History  Problem Relation Age of Onset  . Liver cancer Brother   . Heart Problems Mother   . Breast cancer Neg Hx     Social History   Socioeconomic History  . Marital status: Divorced  Spouse name: Not on file  . Number of children: Not on file  . Years of education: Not on file  . Highest education level: Not on file  Occupational History  . Not on file  Social Needs  . Financial resource strain: Not on file  . Food insecurity:    Worry: Not on file    Inability: Not on file  . Transportation needs:    Medical: Not on file    Non-medical: Not on file  Tobacco Use  . Smoking status: Former Smoker    Packs/day: 1.50    Years: 22.00    Pack years: 33.00    Last attempt to quit: 05/08/2014    Years since quitting: 4.0  . Smokeless tobacco: Never Used  . Tobacco comment: Smoked over 25 years, quit after Heart Attack in 2015.    Substance and  Sexual Activity  . Alcohol use: No    Alcohol/week: 0.0 standard drinks  . Drug use: No  . Sexual activity: Not Currently  Lifestyle  . Physical activity:    Days per week: Not on file    Minutes per session: Not on file  . Stress: Not on file  Relationships  . Social connections:    Talks on phone: Not on file    Gets together: Not on file    Attends religious service: Not on file    Active member of club or organization: Not on file    Attends meetings of clubs or organizations: Not on file    Relationship status: Not on file  . Intimate partner violence:    Fear of current or ex partner: Not on file    Emotionally abused: Not on file    Physically abused: Not on file    Forced sexual activity: Not on file  Other Topics Concern  . Not on file  Social History Narrative  . Not on file    Review of Systems: See HPI, otherwise negative ROS  Physical Exam: BP (!) 176/94   Pulse 61   Temp (!) 97.1 F (36.2 C) (Tympanic)   Resp 18   Ht 5\' 3"  (1.6 m)   Wt 62.1 kg   SpO2 100%   BMI 24.27 kg/m  General:   Alert,  pleasant and cooperative in NAD Head:  Normocephalic and atraumatic. Neck:  Supple; no masses or thyromegaly. Lungs:  Clear throughout to auscultation, normal respiratory effort.    Heart:  +S1, +S2, Regular rate and rhythm, No edema. Abdomen:  Soft, nontender and nondistended. Normal bowel sounds, without guarding, and without rebound.   Neurologic:  Alert and  oriented x4;  grossly normal neurologically.  Impression/Plan: Tonya Cabrera is here for an EGD for follow up of gastric ulcer.  Risks, benefits, limitations, and alternatives regarding the procedure have been reviewed with the patient.  Questions have been answered.  All parties agreeable.   Virgel Manifold, MD  06/08/2018, 9:33 AM

## 2018-06-08 NOTE — Op Note (Signed)
[facility] Gastroenterology Patient Name: Tonya Cabrera Procedure Date: 06/08/2018 9:24 AM MRN: [id number] Account #: [id number] Date of Birth: Dec 26, 1950 Admit Type: Outpatient Age: 67 Room: [facility] ENDO ROOM 4 Gender: Female Note Status: Finalized Procedure:            Upper GI endoscopy Indications:          Follow-up of peptic ulcer Providers:            Anamika Kueker B. Bonna Gains MD, MD Medicines:            Monitored Anesthesia Care Complications:        No immediate complications. Procedure:            Pre-Anesthesia Assessment:                       - Prior to the procedure, a History and Physical was                        performed, and patient medications, allergies and                        sensitivities were reviewed. The patient's tolerance of                        previous anesthesia was reviewed.                       - The risks and benefits of the procedure and the                        sedation options and risks were discussed with the                        patient. All questions were answered and informed                        consent was obtained.                       - Patient identification and proposed procedure were                        verified prior to the procedure by the physician, the                        nurse, the anesthesiologist, the anesthetist and the                        technician. The procedure was verified in the procedure                        room.                       - ASA Grade Assessment: II - A patient with mild                        systemic disease.                       After obtaining informed consent, the endoscope was  passed under direct vision. Throughout the procedure,                        the patient's blood pressure, pulse, and oxygen                        saturations were monitored continuously. The Endoscope                        was introduced through the mouth, and  advanced to the                        third part of duodenum. The upper GI endoscopy was                        accomplished with ease. The patient tolerated the                        procedure well. Findings:      The examined esophagus was normal.      A healed ulcer was found in the gastric antrum. The scar tissue was       healthy in appearance. Biopsies were taken with a cold forceps for       histology. Biopsies were obtained in the gastric body, at the incisura       and in the gastric antrum with cold forceps for histology.      The entire examined stomach was normal otherwise.      The duodenal bulb, second portion of the duodenum and examined duodenum       were normal. Impression:           - Normal esophagus.                       - Scar in the gastric antrum at the site of the                        previous ulcer. Biopsied.                       - Normal stomach otherwise.                       - Normal duodenal bulb, second portion of the duodenum                        and examined duodenum.                       - Biopsies were obtained in the gastric body, at the                        incisura and in the gastric antrum. Recommendation:       - Patient reports her cardiologist has discontinued her                        Plavix and does not plan on restarting it.                       - Discontinue Protonix                       -  Patient is on aspirin due to her cardiac history.                        Avoid any other NSAIDs.                       - If patient has any new abdominal pain, blood in stool                        , weight loss or any other reason for concern she is to                        go to the ER                       - Discharge patient to home (with escort).                       - Advance diet as tolerated.                       - Continue present medications.                       - Patient has a contact number available for                         emergencies. The signs and symptoms of potential                        delayed complications were discussed with the patient.                        Return to normal activities tomorrow. Written discharge                        instructions were provided to the patient.                       - Discharge patient to home (with escort).                       - The findings and recommendations were discussed with                        the patient.                       - The findings and recommendations were discussed with                        the patient's family. Procedure Code(s):    --- Professional ---                       [phone], Esophagogastroduodenoscopy, flexible, transoral;                        with biopsy, single or multiple Diagnosis Code(s):    --- Professional ---                       K31.89, Other diseases of stomach  and duodenum                       K27.9, Peptic ulcer, site unspecified, unspecified as                        acute or chronic, without hemorrhage or perforation CPT copyright 2017 American Medical Association. All rights reserved. The codes documented in this report are preliminary and upon coder review may  be revised to meet current compliance requirements.  [name], MD Margretta Sidle B. Bonna Gains MD, MD 06/08/2018 9:58:10 AM This report has been signed electronically. Number of Addenda: 0 Note Initiated On: 06/08/2018 9:24 AM Estimated Blood Loss: Estimated blood loss: none.      [facility]

## 2018-06-08 NOTE — Anesthesia Post-op Follow-up Note (Signed)
Anesthesia QCDR form completed.        

## 2018-06-08 NOTE — Anesthesia Postprocedure Evaluation (Signed)
Anesthesia Post Note  Patient: Tonya Cabrera  Procedure(s) Performed: ESOPHAGOGASTRODUODENOSCOPY (EGD) WITH PROPOFOL (N/A )  Patient location during evaluation: Endoscopy Anesthesia Type: General Level of consciousness: awake and alert Pain management: pain level controlled Vital Signs Assessment: post-procedure vital signs reviewed and stable Respiratory status: spontaneous breathing, nonlabored ventilation, respiratory function stable and patient connected to nasal cannula oxygen Cardiovascular status: blood pressure returned to baseline and stable Postop Assessment: no apparent nausea or vomiting Anesthetic complications: no     Last Vitals:  Vitals:   06/08/18 1020 06/08/18 1030  BP: (!) 149/69 138/68  Pulse: (!) 58 (!) 53  Resp: 20 (!) 9  Temp:    SpO2: 96% 98%    Last Pain:  Vitals:   06/08/18 0953  TempSrc:   PainSc: 0-No pain                 Precious Haws Graylon Amory

## 2018-06-08 NOTE — Anesthesia Procedure Notes (Signed)
Performed by: Cook-Martin, Ahrianna Siglin Pre-anesthesia Checklist: Patient identified, Emergency Drugs available, Suction available, Patient being monitored and Timeout performed Patient Re-evaluated:Patient Re-evaluated prior to induction Oxygen Delivery Method: Nasal cannula Preoxygenation: Pre-oxygenation with 100% oxygen Induction Type: IV induction Airway Equipment and Method: Bite block Placement Confirmation: positive ETCO2 and CO2 detector       

## 2018-06-13 ENCOUNTER — Other Ambulatory Visit: Payer: Self-pay | Admitting: Gastroenterology

## 2018-06-13 LAB — SURGICAL PATHOLOGY

## 2018-06-13 MED ORDER — PANTOPRAZOLE SODIUM 20 MG PO TBEC: 20.0000 mg | DELAYED_RELEASE_TABLET | Freq: Two times a day (BID) | ORAL | 0 refills | Status: DC

## 2018-06-13 MED ORDER — CLARITHROMYCIN 250 MG PO TABS: 500.0000 mg | ORAL_TABLET | Freq: Two times a day (BID) | ORAL | 0 refills | Status: AC

## 2018-06-13 MED ORDER — AMOXICILLIN 500 MG PO TABS: 1000.0000 mg | ORAL_TABLET | Freq: Two times a day (BID) | ORAL | 0 refills | Status: AC

## 2018-06-14 ENCOUNTER — Ambulatory Visit (INDEPENDENT_AMBULATORY_CARE_PROVIDER_SITE_OTHER): Payer: Medicare Other | Admitting: Gastroenterology

## 2018-06-14 ENCOUNTER — Encounter: Payer: Self-pay | Admitting: Gastroenterology

## 2018-06-14 VITALS — BP 114/54 | HR 71 | Ht 63.0 in | Wt 133.2 lb

## 2018-06-14 DIAGNOSIS — K2990 Gastroduodenitis, unspecified, without bleeding: Secondary | ICD-10-CM | POA: Diagnosis not present

## 2018-06-14 DIAGNOSIS — K2970 Gastritis, unspecified, without bleeding: Secondary | ICD-10-CM | POA: Diagnosis not present

## 2018-06-14 NOTE — Progress Notes (Signed)
[name], MD [address]  [place]  [place], [place] [address]  Main: [phone]  Fax: [phone]   Primary Care Physician: Valerie Roys, DO  Primary Gastroenterologist:  Dr. [name]  Chief Complaint  Patient presents with  . Follow-up    H pylori    HPI: Tonya Cabrera is a 67 y.o. female here for follow-up of gastric ulcer.  Gastric biopsies on last EGD showed H. Pylori. The patient denies abdominal or flank pain, anorexia, nausea or vomiting, dysphagia, change in bowel habits or black or bloody stools or weight loss.  She has not started her triple therapy yet.  Patient's Plavix has been discontinued by her cardiologist patient states there are no plans to resume it at this time.  Current Outpatient Medications  Medication Sig Dispense Refill  . alendronate (FOSAMAX) 70 MG tablet TAKE ONE TABLET BY MOUTH EVERY 7 DAYS ON EMPTY STOMACH WITH FULL GLASS OF WATER  11  . amoxicillin (AMOXIL) 500 MG tablet Take 2 tablets (1,000 mg total) by mouth 2 (two) times daily for 14 days. 56 tablet 0  . aspirin 81 MG EC tablet TAKE 1 TABLET (81 MG TOTAL) BY MOUTH DAILY. 90 tablet 4  . atorvastatin (LIPITOR) 40 MG tablet Take 1 tablet (40 mg total) by mouth daily. 90 tablet 1  . clarithromycin (BIAXIN) 250 MG tablet Take 2 tablets (500 mg total) by mouth 2 (two) times daily for 14 days. 56 tablet 0  . lisinopril (PRINIVIL,ZESTRIL) 10 MG tablet Take 1 tablet (10 mg total) by mouth daily. 90 tablet 1  . metoprolol succinate (TOPROL-XL) 25 MG 24 hr tablet Take 1 tablet (25 mg total) by mouth 2 (two) times daily. 180 tablet 1  . pantoprazole (PROTONIX) 20 MG tablet Take 1 tablet (20 mg total) by mouth 2 (two) times daily before a meal for 14 days. 28 tablet 0  . ferrous sulfate (FERROUSUL) 325 (65 FE) MG tablet Take 1 tablet (325 mg total) by mouth 3 (three) times daily with meals. (Patient not taking: Reported on 06/14/2018) 90 tablet 3  . Omega-3 Fatty Acids  (FISH OIL) 1000 MG CPDR Take 1 capsule by mouth daily.      No current facility-administered medications for this visit.     Allergies as of 06/14/2018  . (No Known Allergies)    ROS:  General: Negative for anorexia, weight loss, fever, chills, fatigue, weakness. ENT: Negative for hoarseness, difficulty swallowing , nasal congestion. CV: Negative for chest pain, angina, palpitations, dyspnea on exertion, peripheral edema.  Respiratory: Negative for dyspnea at rest, dyspnea on exertion, cough, sputum, wheezing.  GI: See history of present illness. GU:  Negative for dysuria, hematuria, urinary incontinence, urinary frequency, nocturnal urination.  Endo: Negative for unusual weight change.    Physical Examination:   BP (!) 114/54   Pulse 71   Ht 5\' 3"  (1.6 m)   Wt 133 lb 3.2 oz (60.4 kg)   BMI 23.60 kg/m   General: Well-nourished, well-developed in no acute distress.  Eyes: No icterus. Conjunctivae pink. Mouth: Oropharyngeal mucosa moist and pink , no lesions erythema or exudate. Neck: Supple, Trachea midline Abdomen: Bowel sounds are normal, nontender, nondistended, no hepatosplenomegaly or masses, no abdominal bruits or hernia , no rebound or guarding.   Extremities: No lower extremity edema. No clubbing or deformities. Neuro: Alert and oriented x 3.  Grossly intact. Skin: Warm and dry, no jaundice.   Psych: Alert and cooperative, normal mood and affect.  Labs: CMP     Component Value Date/Time   NA 143 02/20/2018 0531   NA 142 12/23/2017 1352   NA 144 05/16/2014 0020   K 3.5 02/20/2018 0531   K 3.9 05/16/2014 0020   CL 118 (H) 02/20/2018 0531   CL 109 (H) 05/16/2014 0020   CO2 19 (L) 02/20/2018 0531   CO2 23 05/16/2014 0020   GLUCOSE 91 02/20/2018 0531   GLUCOSE 121 (H) 05/16/2014 0020   BUN 34 (H) 02/20/2018 0531   BUN 14 12/23/2017 1352   BUN 9 05/16/2014 0020   CREATININE 1.07 (H) 02/20/2018 0531   CREATININE 0.90 05/16/2014 0020   CALCIUM 8.0 (L)  02/20/2018 0531   CALCIUM 8.2 (L) 05/16/2014 0020   PROT 6.0 (L) 02/20/2018 0531   PROT 7.0 12/23/2017 1352   PROT 7.5 05/15/2014 0842   ALBUMIN 3.5 02/20/2018 0531   ALBUMIN 4.6 12/23/2017 1352   ALBUMIN 4.1 05/15/2014 0842   AST 19 02/20/2018 0531   AST 89 (H) 05/15/2014 0842   ALT 12 (L) 02/20/2018 0531   ALT 22 05/15/2014 0842   ALKPHOS 51 02/20/2018 0531   ALKPHOS 108 05/15/2014 0842   BILITOT 1.5 (H) 02/20/2018 0531   BILITOT 1.9 (H) 12/23/2017 1352   BILITOT 1.0 05/15/2014 0842   GFRNONAA 53 (L) 02/20/2018 0531   GFRNONAA >60 05/16/2014 0020   GFRAA >60 02/20/2018 0531   GFRAA >60 05/16/2014 0020   Lab Results  Component Value Date   WBC 7.1 03/04/2018   HGB 9.2 (L) 03/04/2018   HCT 27.5 (L) 03/04/2018   MCV 95 03/04/2018   PLT 257 03/04/2018    Imaging Studies: No results found.  Assessment and Plan:   Tonya Cabrera is a 67 y.o. y/o female here for follow-up of gastric ulcer and H. pylori  triple therapy prescribed Patient asked to start this, and I verified with Dr. Wynetta Emery via epic messaging and she states it is okay to hold her atorvastatin for the 14 days of treatment for H. Pylori  She continues to refuse colonoscopy Therefore, I have asked her to follow-up with her primary care provider to discuss alternative cortical skin screening methods, and inform her that if these are positive she will need a colonoscopy done anyway.  She verbalized understanding and would like to follow-up with her primary care provider in this regard.  Her primary care provider has also given her iron replacement. I have asked her to follow-up with them to have her iron levels rechecked to see if it is still low, or if she still needs iron replacement. I have also discussed that if it is low, to reconsider colonoscopy as colon malignancy is important to rule out in the setting of iron deficiency given that her gastric ulcer has healed and does not explain iron deficiency if it is  still present.  She verbalized understanding and will follow-up with Dr. Wynetta Emery in this regard.  We will follow-up in clinic with her in about 6 to 8 weeks to do H. pylori breath test for eradication testing.  And at that time, if iron levels have not been repeated, can repeat rediscuss colonoscopy at that time.   " Tonya Manifold, MD  Valerie Roys, DO  Cc: Tonya Lee, LPN        I am seeing her in clinic tomorrow. We will take care of it.   Tonya Cabrera, can you please inform the patient of the antibiotics prescribed and to hold her  atorvastatin. Thank you!   Previous Messages    ----- Message -----  From: Valerie Roys, DO  Sent: 06/13/2018  4:01 PM EDT  To: Tonya Manifold, MD   We can hold her atorvastatin while she's on the the treatment. Let me know if you've told her to do that or if you'd like me to. Thank you!  ----- Message -----  From: Tonya Manifold, MD  Sent: 06/13/2018  3:42 PM EDT  To: Tonya Lee, LPN, Tonya Annia Friendly, DO   Hi Dr. Wynetta Emery,   This patient needs to be treated for H. pylori. The clarithromycin for her H. pylori triple therapy interacts with her atorvastatin. Would you recommend holding her atorvastatin or decreasing it? For the 14 days of H. pylori treatment?. Thank you.   Tonya Cabrera         Dr [name]

## 2018-06-14 NOTE — Patient Instructions (Signed)
F/U 8 Weeks  Hold atorvastin for 14 days while taking antibiotics for H Pylori.   Helicobacter Pylori Infection Helicobacter pylori infection is an infection in the stomach that is caused by the Helicobacter pylori (H. pylori) bacteria. This type of bacteria often lives in the lining of the stomach. The infection can cause ulcers and irritation (gastritis) in some people. It is the most common cause of ulcers in the stomach (gastric ulcer) and in the upper part of the intestine (duodenal ulcer). Having this infection may also increase the risk of stomach cancer and a type of white blood cell cancer (lymphoma) that affects the stomach. What are the causes? H. pylori is a type of bacteria that is often found in the stomachs of healthy people. The bacteria may be passed from person to person through contact with stool or saliva. It is not known why some people develop ulcers, gastritis, or cancer from the infection. What increases the risk? This condition is more likely to develop in people who:  Have family members with the infection.  Live with many other people, such as in a dormitory.  Are of African, Hispanic, or Asian descent.  What are the signs or symptoms? Most people with this infection do not have symptoms. If you do have symptoms, they may include:  Heartburn.  Stomach pain.  Nausea.  Vomiting.  Blood-tinged vomit.  Loss of appetite.  Bad breath.  How is this diagnosed? This condition may be diagnosed based on your symptoms, a physical exam, and various tests. Tests may include:  Blood tests or stool tests to check for the proteins (antibodies) that your body may produce in response to the bacteria. These tests are the best way to confirm the diagnosis.  A breath test to check for the type of gas that the H. pylori bacteria release after breaking down a substance called urea. For the test, you are asked to drink urea. This test is often done after treatment in order  to find out if the treatment worked.  A procedure in which a thin, flexible tube with a tiny camera at the end is placed into your stomach and upper intestine (upper endoscopy). Your health care provider may also take tissue samples (biopsy) to test for H. pylori and cancer.  How is this treated? Treatment for this condition usually involves taking a combination of medicines (triple therapy) for a couple of weeks. Triple therapy includes one medicine to reduce the acid in your stomach and two types of antibiotic medicines. Many drug combinations have been approved for treatment. Treatment usually kills the H. pylori and reduces your risk of cancer. You may need to be tested for H. pylori again after treatment. In some cases, the treatment may need to be repeated. Follow these instructions at home:  Take over-the-counter and prescription medicines only as told by your health care provider.  Take your antibiotics as told by your health care provider. Do not stop taking the antibiotics even if you start to feel better.  You can do all your usual activities and eat what you usually do.  Take steps to prevent future infections: ? Wash your hands often. ? Make sure the food you eat has been properly prepared. ? Drink water only from clean sources.  Keep all follow-up visits as told by your health care provider. This is important. Contact a health care provider if:  Your symptoms do not get better.  Your symptoms return after treatment. This information is not intended  to replace advice given to you by your health care provider. Make sure you discuss any questions you have with your health care provider. Document Released: 01/06/2016 Document Revised: 02/20/2016 Document Reviewed: 09/26/2014 Elsevier Interactive Patient Education  2018 [employer].

## 2018-06-17 ENCOUNTER — Telehealth: Payer: Self-pay

## 2018-06-17 NOTE — Telephone Encounter (Signed)
Pt was seen in clinic and results were given at that time.

## 2018-06-17 NOTE — Telephone Encounter (Signed)
-----   Message from Virgel Manifold, MD sent at 06/13/2018  3:45 PM EDT ----- Jackelyn Poling please let patient know, her biopsies showed H. pylori bacteria.  I have sent prescription over to her pharmacy.  I have messaged her primary care provider to to see if her atorvastatin can be held for 14 days as it interacts with her clarithromycin.  Please call and let the patient know so she does not take her antibiotics with her atorvastatin until we hear from Dr. Wynetta Emery.

## 2018-06-26 NOTE — Progress Notes (Signed)
BP 138/62 (BP Location: Left Arm, Cuff Size: Normal)   Pulse 68   Wt 133 lb (60.3 kg)   SpO2 100%   BMI 23.56 kg/m    Subjective:    Patient ID: Tonya Cabrera, female    DOB: [birth year], 67 y.o.   MRN: [id number]  HPI: Tonya Cabrera is a 67 y.o. female  Chief Complaint  Patient presents with  . Hypertension  . Hyperlipidemia  . Anemia   HYPERTENSION / HYPERLIPIDEMIA- has been off cholesterol medicine while being treated for h. pylori Satisfied with current treatment? yes Duration of hypertension: chronic BP monitoring frequency: not checking BP medication side effects: no Past BP meds:  Lisinopril, metoprolol Duration of hyperlipidemia: chronic Cholesterol medication side effects: no Cholesterol supplements: none Past cholesterol medications: atorvastatin Medication compliance: excellent compliance Aspirin: yes Recent stressors: no Recurrent headaches: no Visual changes: no Palpitations: no Dyspnea: no Chest pain: no Lower extremity edema: no Dizzy/lightheaded: no  ANEMIA Anemia status: better Etiology of anemia: Iron def. From GI bleed Duration of anemia treatment: chronic Compliance with treatment: good compliance Iron supplementation side effects: no Severity of anemia: moderate Fatigue: no Decreased exercise tolerance: no  Dyspnea on exertion: no Palpitations: no Bleeding: no Pica: no  DEPRESSION Mood status: better Satisfied with current treatment?: yes Symptom severity: mild  Duration of current treatment : chronic Side effects: no Medication compliance: excellent compliance Psychotherapy/counseling: no  Previous psychiatric medications: zoloft Depressed mood: no Anxious mood: no Anhedonia: no Significant weight loss or gain: no Insomnia: no  Fatigue: yes Feelings of worthlessness or guilt: no Impaired concentration/indecisiveness: no Suicidal ideations: no Hopelessness: no Crying spells: no Depression screen [facility]: Lambert [name] 2/9 03/04/2018  06/02/2017 01/25/2017 06/08/2016  Decreased Interest 3 1 0 0  Down, Depressed, Hopeless 3 1 3  0  PHQ - 2 Score 6 2 3  0  Altered sleeping 3 3 3  -  Tired, decreased energy 3 0 0 -  Change in appetite 2 0 0 -  Feeling bad or failure about yourself  2 0 1 -  Trouble concentrating 3 1 2  -  Moving slowly or fidgety/restless 3 0 0 -  Suicidal thoughts 2 0 0 -  PHQ-9 Score 24 6 9  -  Difficult doing work/chores Extremely dIfficult Not difficult at all - -     Relevant past medical, surgical, family and social history reviewed and updated as indicated. Interim medical history since our last visit reviewed. Allergies and medications reviewed and updated.  Review of Systems  Constitutional: Negative.   Respiratory: Negative.   Cardiovascular: Negative.   Gastrointestinal: Negative.   Psychiatric/Behavioral: Negative.     Per HPI unless specifically indicated above     Objective:    BP 138/62 (BP Location: Left Arm, Cuff Size: Normal)   Pulse 68   Wt 133 lb (60.3 kg)   SpO2 100%   BMI 23.56 kg/m   Wt Readings from Last 3 Encounters:  06/27/18 133 lb (60.3 kg)  06/14/18 133 lb 3.2 oz (60.4 kg)  06/08/18 137 lb (62.1 kg)    Physical Exam  Constitutional: She is oriented to person, place, and time. She appears well-developed and well-nourished. No distress.  HENT:  Head: Normocephalic and atraumatic.  Right Ear: Hearing normal.  Left Ear: Hearing normal.  Nose: Nose normal.  Eyes: Conjunctivae and lids are normal. Right eye exhibits no discharge. Left eye exhibits no discharge. No scleral icterus.  Cardiovascular: Normal rate, regular rhythm, normal heart sounds and intact distal pulses. Exam  reveals no gallop and no friction rub.  No murmur heard. Pulmonary/Chest: Effort normal and breath sounds normal. No stridor. No respiratory distress. She has no wheezes. She has no rales. She exhibits no tenderness.  Musculoskeletal: Normal range of motion.  Neurological: She is alert and  oriented to person, place, and time.  Skin: Skin is warm, dry and intact. Capillary refill takes less than 2 seconds. No rash noted. She is not diaphoretic. No erythema. No pallor.  Psychiatric: She has a normal mood and affect. Her speech is normal and behavior is normal. Judgment and thought content normal. Cognition and memory are normal.  Nursing note and vitals reviewed.      Assessment & Plan:   Problem List Items Addressed This Visit      Cardiovascular and Mediastinum   CAD (coronary artery disease)    Will keep BP and cholesterol under good continue to monitor. Call with any concerns.       Relevant Medications   atorvastatin (LIPITOR) 40 MG tablet   lisinopril (PRINIVIL,ZESTRIL) 10 MG tablet   metoprolol succinate (TOPROL-XL) 25 MG 24 hr tablet   Other Relevant Orders   CBC with Differential/Platelet   Comprehensive metabolic panel   Aortic atherosclerosis (HCC)    Will keep BP and cholesterol under good continue to monitor. Call with any concerns.       Relevant Medications   atorvastatin (LIPITOR) 40 MG tablet   lisinopril (PRINIVIL,ZESTRIL) 10 MG tablet   metoprolol succinate (TOPROL-XL) 25 MG 24 hr tablet   Other Relevant Orders   CBC with Differential/Platelet   Comprehensive metabolic panel   HTN (hypertension) - Primary    Under good control on current regimen. Continue current regimen. Continue to monitor. Call with any concerns. Refills given.        Relevant Medications   atorvastatin (LIPITOR) 40 MG tablet   lisinopril (PRINIVIL,ZESTRIL) 10 MG tablet   metoprolol succinate (TOPROL-XL) 25 MG 24 hr tablet   Other Relevant Orders   CBC with Differential/Platelet   Comprehensive metabolic panel     Digestive   Acute gastric ulcer without hemorrhage or perforation    Rechecking levels today. Await results. Continue to monitor.       Relevant Orders   CBC with Differential/Platelet   Comprehensive metabolic panel     Genitourinary   Benign  hypertensive renal disease    Under good control on current regimen. Continue current regimen. Continue to monitor. Call with any concerns. Refills given.        Relevant Orders   CBC with Differential/Platelet   Comprehensive metabolic panel     Other   Hyperlipidemia    Under good control on current regimen. Continue current regimen. Continue to monitor. Call with any concerns. Refills given. Off medicine right now as she's on clarithrthromycin. Await results.        Relevant Medications   atorvastatin (LIPITOR) 40 MG tablet   lisinopril (PRINIVIL,ZESTRIL) 10 MG tablet   metoprolol succinate (TOPROL-XL) 25 MG 24 hr tablet   Other Relevant Orders   CBC with Differential/Platelet   Comprehensive metabolic panel   Lipid Panel w/o Chol/HDL Ratio   Acute posthemorrhagic anemia    Rechecking levels today. Await results. Continue to monitor.       Relevant Orders   CBC with Differential/Platelet   Comprehensive metabolic panel   Depression, recurrent ([place])    Under good control on current regimen. Continue current regimen. Continue to monitor. Call with any concerns.  Relevant Orders   CBC with Differential/Platelet   Comprehensive metabolic panel       Follow up plan: Return in about 6 months (around 12/26/2018) for Physical and Wellness.

## 2018-06-27 ENCOUNTER — Ambulatory Visit (INDEPENDENT_AMBULATORY_CARE_PROVIDER_SITE_OTHER): Payer: Medicare Other | Admitting: Family Medicine

## 2018-06-27 ENCOUNTER — Encounter: Payer: Self-pay | Admitting: Family Medicine

## 2018-06-27 VITALS — BP 138/62 | HR 68 | Wt 133.0 lb

## 2018-06-27 DIAGNOSIS — E782 Mixed hyperlipidemia: Secondary | ICD-10-CM

## 2018-06-27 DIAGNOSIS — F339 Major depressive disorder, recurrent, unspecified: Secondary | ICD-10-CM

## 2018-06-27 DIAGNOSIS — I10 Essential (primary) hypertension: Secondary | ICD-10-CM

## 2018-06-27 DIAGNOSIS — K253 Acute gastric ulcer without hemorrhage or perforation: Secondary | ICD-10-CM

## 2018-06-27 DIAGNOSIS — I129 Hypertensive chronic kidney disease with stage 1 through stage 4 chronic kidney disease, or unspecified chronic kidney disease: Secondary | ICD-10-CM

## 2018-06-27 DIAGNOSIS — I25119 Atherosclerotic heart disease of native coronary artery with unspecified angina pectoris: Secondary | ICD-10-CM | POA: Diagnosis not present

## 2018-06-27 DIAGNOSIS — I700 Atherosclerosis of aorta: Secondary | ICD-10-CM | POA: Diagnosis not present

## 2018-06-27 DIAGNOSIS — D62 Acute posthemorrhagic anemia: Secondary | ICD-10-CM

## 2018-06-27 MED ORDER — LISINOPRIL 10 MG PO TABS: 10.0000 mg | ORAL_TABLET | Freq: Every day | ORAL | 1 refills | Status: DC

## 2018-06-27 MED ORDER — ATORVASTATIN CALCIUM 40 MG PO TABS: 40.0000 mg | ORAL_TABLET | Freq: Every day | ORAL | 1 refills | Status: DC

## 2018-06-27 MED ORDER — METOPROLOL SUCCINATE ER 25 MG PO TB24: 25.0000 mg | ORAL_TABLET | Freq: Two times a day (BID) | ORAL | 1 refills | Status: DC

## 2018-06-27 NOTE — Assessment & Plan Note (Signed)
Under good control on current regimen. Continue current regimen. Continue to monitor. Call with any concerns. Refills given.   

## 2018-06-27 NOTE — Assessment & Plan Note (Signed)
Rechecking levels today. Await results. Continue to monitor.  

## 2018-06-27 NOTE — Assessment & Plan Note (Addendum)
Under good control on current regimen. Continue current regimen. Continue to monitor. Call with any concerns.   

## 2018-06-27 NOTE — Assessment & Plan Note (Signed)
Will keep BP and cholesterol under good continue to monitor. Call with any concerns.  

## 2018-06-27 NOTE — Assessment & Plan Note (Signed)
Under good control on current regimen. Continue current regimen. Continue to monitor. Call with any concerns. Refills given. Off medicine right now as she's on clarithrthromycin. Await results.

## 2018-06-28 ENCOUNTER — Telehealth: Payer: Self-pay | Admitting: Family Medicine

## 2018-06-28 ENCOUNTER — Other Ambulatory Visit: Payer: Self-pay | Admitting: Family Medicine

## 2018-06-28 LAB — COMPREHENSIVE METABOLIC PANEL
A/G RATIO: 1.6 (ref 1.2–2.2)
ALT: 20 IU/L (ref 0–32)
AST: 18 IU/L (ref 0–40)
Albumin: 4.2 g/dL (ref 3.6–4.8)
Alkaline Phosphatase: 96 IU/L (ref 39–117)
BUN / CREAT RATIO: 23 (ref 12–28)
BUN: 19 mg/dL (ref 8–27)
Bilirubin Total: 1.2 mg/dL (ref 0.0–1.2)
CALCIUM: 9.5 mg/dL (ref 8.7–10.3)
CO2: 20 mmol/L (ref 20–29)
Chloride: 109 mmol/L — ABNORMAL HIGH (ref 96–106)
Creatinine, Ser: 0.83 mg/dL (ref 0.57–1.00)
GFR calc Af Amer: 84 mL/min/{1.73_m2} (ref >59)
GFR, EST NON AFRICAN AMERICAN: 73 mL/min/{1.73_m2} (ref >59)
GLOBULIN, TOTAL: 2.6 g/dL (ref 1.5–4.5)
Glucose: 63 mg/dL — ABNORMAL LOW (ref 65–99)
Potassium: 4.5 mmol/L (ref 3.5–5.2)
Sodium: 145 mmol/L — ABNORMAL HIGH (ref 134–144)
TOTAL PROTEIN: 6.8 g/dL (ref 6.0–8.5)

## 2018-06-28 LAB — CBC WITH DIFFERENTIAL/PLATELET
BASOS: 0 %
Basophils Absolute: 0 10*3/uL (ref 0.0–0.2)
EOS (ABSOLUTE): 0.1 10*3/uL (ref 0.0–0.4)
EOS: 1 %
HEMATOCRIT: 37.8 % (ref 34.0–46.6)
Hemoglobin: 13.1 g/dL (ref 11.1–15.9)
IMMATURE GRANS (ABS): 0 10*3/uL (ref 0.0–0.1)
IMMATURE GRANULOCYTES: 0 %
LYMPHS: 31 %
Lymphocytes Absolute: 2.2 10*3/uL (ref 0.7–3.1)
MCH: 30.3 pg (ref 26.6–33.0)
MCHC: 34.7 g/dL (ref 31.5–35.7)
MCV: 88 fL (ref 79–97)
Monocytes Absolute: 0.7 10*3/uL (ref 0.1–0.9)
Monocytes: 10 %
NEUTROS PCT: 58 %
Neutrophils Absolute: 4.1 10*3/uL (ref 1.4–7.0)
Platelets: 215 10*3/uL (ref 150–450)
RBC: 4.32 x10E6/uL (ref 3.77–5.28)
RDW: 14.8 % (ref 12.3–15.4)
WBC: 7 10*3/uL (ref 3.4–10.8)

## 2018-06-28 LAB — LIPID PANEL W/O CHOL/HDL RATIO
Cholesterol, Total: 267 mg/dL — ABNORMAL HIGH (ref 100–199)
HDL: 47 mg/dL (ref >39)
TRIGLYCERIDES: 616 mg/dL — AB (ref 0–149)

## 2018-06-28 NOTE — Telephone Encounter (Signed)
Please let her know that her labs came back normal except her cholesterol which is quite high, probably because she's off her medicine. We'll recheck it when she's back on. Thanks!

## 2018-06-29 NOTE — Telephone Encounter (Signed)
Patient notified

## 2018-07-02 ENCOUNTER — Telehealth: Payer: Self-pay

## 2018-07-02 NOTE — Telephone Encounter (Signed)
Call pt regarding lung screening. Pt former smoker , scan can be any day and any afternoon.

## 2018-07-04 ENCOUNTER — Telehealth: Payer: Self-pay | Admitting: *Deleted

## 2018-07-04 DIAGNOSIS — Z122 Encounter for screening for malignant neoplasm of respiratory organs: Secondary | ICD-10-CM

## 2018-07-04 NOTE — Telephone Encounter (Signed)
Patient has been notified that the annual lung cancer screening low dose CT scan is due currently or will be in the near future.  Confirmed that the patient is within the age range of 68-80, and asymptomatic, and currently exhibits no signs or symptoms of lung cancer.  Patient denies illness that would prevent curative treatment for lung cancer if found.  Verified smoking history, former smoker quit 2015 with 33 pkyr history .  The shared decision making visit was completed on 07-16-16.  Patient is agreeable for the CT scan to be scheduled.  Will call patient back with date and time of appointment.

## 2018-07-05 ENCOUNTER — Telehealth: Payer: Self-pay | Admitting: *Deleted

## 2018-07-05 NOTE — Telephone Encounter (Signed)
Called pt to inform her of the appt change for ldct screening now at [place] @ 2:15 pm on 07-19-18, voiced understanding, mailed to patient.

## 2018-07-05 NOTE — Telephone Encounter (Signed)
Called patient to inform her of her appt for ldct screening on 07-07-18 @ 2:15 pm @ [facility], voiced understanding.

## 2018-07-07 ENCOUNTER — Ambulatory Visit: Admission: RE | Admit: 2018-07-07 | Payer: Medicare Other | Source: Ambulatory Visit

## 2018-07-07 ENCOUNTER — Ambulatory Visit: Payer: Self-pay | Admitting: Family Medicine

## 2018-07-07 ENCOUNTER — Other Ambulatory Visit: Payer: Self-pay | Admitting: Family Medicine

## 2018-07-07 NOTE — Telephone Encounter (Signed)
Pt c/o diarrhea since June. Pt stated that she has at least 10 stools per day. Pt stated that she has been incontinent several times of diarrhea.Pt denies vomiting and abdominal pain. Pt stated she is getting fatigued easier. Pt denies abdominal pain. Pt denies signs of dehydration and voided just before being called. Pt denies foreign travel, denies exposure to anyone with diarrhea or eating any spoiled food.  Pt stated that she was taking 2 antibiotics for 2 weeks in the past 2 months.Pt denies fever or blood in stool. Stools are light brown in color. Pt stated that she was diagnosed with irritable bowel syndrome in 2010.  Pt works as a Technical brewer.  Care advice given and pt stated understanding. Pt given an appt for tomorrow morning at 10:45 with PCP. Asked pt to bring stool sample if possible.  Reason for Disposition . Diarrhea is a chronic symptom (recurrent or ongoing AND present > 4 weeks)  Answer Assessment - Initial Assessment Questions 1. DIARRHEA SEVERITY: "How bad is the diarrhea?" "How many extra stools have you had in the past 24 hours than normal?"    - NO DIARRHEA (SCALE 0)   - MILD (SCALE 1-3): Few loose or mushy BMs; increase of 1-3 stools over normal daily number of stools; mild increase in ostomy output.   -  MODERATE (SCALE 4-7): Increase of 4-6 stools daily over normal; moderate increase in ostomy output. * SEVERE (SCALE 8-10; OR 'WORST POSSIBLE'): Increase of 7 or more stools daily over normal; moderate increase in ostomy output; incontinence.     Has diarrhea every day- severe  10 or more 2. ONSET: "When did the diarrhea begin?"      June 3. BM CONSISTENCY: "How loose or watery is the diarrhea?"      Watery with a lot of gas 4. VOMITING: "Are you also vomiting?" If so, ask: "How many times in the past 24 hours?"      no 5. ABDOMINAL PAIN: "Are you having any abdominal pain?" If yes: "What does it feel like?" (e.g., crampy, dull, intermittent, constant)      no 6. ABDOMINAL  PAIN SEVERITY: If present, ask: "How bad is the pain?"  (e.g., Scale 1-10; mild, moderate, or severe)   - MILD (1-3): doesn't interfere with normal activities, abdomen soft and not tender to touch    - MODERATE (4-7): interferes with normal activities or awakens from sleep, tender to touch    - SEVERE (8-10): excruciating pain, doubled over, unable to do any normal activities       No pain 7. ORAL INTAKE: If vomiting, "Have you been able to drink liquids?" "How much fluids have you had in the past 24 hours?"     No vomiting 8. HYDRATION: "Any signs of dehydration?" (e.g., dry mouth [not just dry lips], too weak to stand, dizziness, new weight loss) "When did you last urinate?"     Fatigue- last urinated 1:50 pm 9. EXPOSURE: "Have you traveled to a foreign country recently?" "Have you been exposed to anyone with diarrhea?" "Could you have eaten any food that was spoiled?"     No-no-no 10. ANTIBIOTIC USE: "Are you taking antibiotics now or have you taken antibiotics in the past 2 months?"       Was taking 2 abx for  weeks 11. OTHER SYMPTOMS: "Do you have any other symptoms?" (e.g., fever, blood in stool)       no 12. PREGNANCY: "Is there any chance you are pregnant?" "When was  your last menstrual period?"       n/a  Protocols used: DIARRHEA-A-AH

## 2018-07-08 ENCOUNTER — Other Ambulatory Visit: Payer: Self-pay

## 2018-07-08 ENCOUNTER — Encounter: Payer: Self-pay | Admitting: Family Medicine

## 2018-07-08 ENCOUNTER — Ambulatory Visit (INDEPENDENT_AMBULATORY_CARE_PROVIDER_SITE_OTHER): Payer: Medicare Other | Admitting: Family Medicine

## 2018-07-08 VITALS — BP 115/74 | HR 55 | Temp 97.8°F | Ht 63.0 in | Wt 132.6 lb

## 2018-07-08 DIAGNOSIS — D62 Acute posthemorrhagic anemia: Secondary | ICD-10-CM | POA: Diagnosis not present

## 2018-07-08 DIAGNOSIS — R197 Diarrhea, unspecified: Secondary | ICD-10-CM

## 2018-07-08 LAB — CBC WITH DIFFERENTIAL/PLATELET
Hematocrit: 36.7 %
Hemoglobin: 13.3 g/dL
LYMPHS ABS: 2.6 10*3/uL
LYMPHS: 35 %
MCH: 31.2 pg
MCHC: 36.2 g/dL
MCV: 86 fL
MID (ABSOLUTE): 0.4 10*3/uL
MID: 5 %
Neutrophils Absolute: 4.4 10*3/uL
Neutrophils: 60 %
PLATELETS: 231 10*3/uL (ref 150–450)
RBC: 4.26 x10E6/uL
RDW: 14.1 %
WBC: 7.4 10*3/uL (ref 3.4–10.8)

## 2018-07-08 NOTE — Assessment & Plan Note (Signed)
Checking labs today. Await results. Call with any concerns. Await results and treat as needed. Follow up with GI as needed.

## 2018-07-08 NOTE — Progress Notes (Signed)
BP 115/74   Pulse (!) 55   Temp 97.8 F (36.6 C) (Oral)   Ht 5\' 3"  (1.6 m)   Wt 132 lb 9.6 oz (60.1 kg)   SpO2 100%   BMI 23.49 kg/m    Subjective:    Patient ID: Tonya Cabrera, female    DOB: 1951/08/06, 67 y.o.   MRN: [id number]  HPI: Tonya Cabrera is a 67 y.o. female  Chief Complaint  Patient presents with  . Diarrhea    pt states has had diarrhea for about 2 to 3 months/ states has gotten worse this last 2 weeks/ Has been taking OTC antidiarrheal, helps only for a few hours   ABDOMINAL ISSUES Duration: 2-3 months, worse in the last 2-3 weeks Nature: none Severity: severe  Radiation: no Episode duration: Frequency: intermittent Alleviating factors: Aggravating factors: Treatments attempted: anti-diarrhea Constipation: no Diarrhea: yes Episodes of diarrhea/day: 10-12x day Mucous in the stool: no Heartburn: no Bloating:no Flatulence: yes Nausea: no Vomiting: no Melena or hematochezia: no Rash: no Jaundice: no Fever: no Weight loss: no  Depression screen [facility] 2/9 07/08/2018 03/04/2018 06/02/2017 01/25/2017 06/08/2016  Decreased Interest 0 3 1 0 0  Down, Depressed, Hopeless 0 3 1 3  0  PHQ - 2 Score 0 6 2 3  0  Altered sleeping 3 3 3 3  -  Tired, decreased energy 1 3 0 0 -  Change in appetite 0 2 0 0 -  Feeling bad or failure about yourself  0 2 0 1 -  Trouble concentrating 3 3 1 2  -  Moving slowly or fidgety/restless 0 3 0 0 -  Suicidal thoughts 0 2 0 0 -  PHQ-9 Score 7 24 6 9  -  Difficult doing work/chores Not difficult at all Extremely dIfficult Not difficult at all - -    Relevant past medical, surgical, family and social history reviewed and updated as indicated. Interim medical history since our last visit reviewed. Allergies and medications reviewed and updated.  Review of Systems  Constitutional: Negative.   Respiratory: Negative.   Cardiovascular: Negative.   Gastrointestinal: Positive for diarrhea. Negative for abdominal distention, abdominal  pain, anal bleeding, blood in stool, constipation, nausea, rectal pain and vomiting.  Musculoskeletal: Negative.   Skin: Negative.   Neurological: Negative.   Psychiatric/Behavioral: Negative.     Per HPI unless specifically indicated above     Objective:    BP 115/74   Pulse (!) 55   Temp 97.8 F (36.6 C) (Oral)   Ht 5\' 3"  (1.6 m)   Wt 132 lb 9.6 oz (60.1 kg)   SpO2 100%   BMI 23.49 kg/m   Wt Readings from Last 3 Encounters:  07/08/18 132 lb 9.6 oz (60.1 kg)  06/27/18 133 lb (60.3 kg)  06/14/18 133 lb 3.2 oz (60.4 kg)    Physical Exam  Constitutional: She is oriented to person, place, and time. She appears well-developed and well-nourished. No distress.  HENT:  Head: Normocephalic and atraumatic.  Right Ear: Hearing normal.  Left Ear: Hearing normal.  Nose: Nose normal.  Eyes: Conjunctivae and lids are normal. Right eye exhibits no discharge. Left eye exhibits no discharge. No scleral icterus.  Cardiovascular: Normal rate, regular rhythm, normal heart sounds and intact distal pulses. Exam reveals no gallop and no friction rub.  No murmur heard. Pulmonary/Chest: Effort normal and breath sounds normal. No stridor. No respiratory distress. She has no wheezes. She has no rales. She exhibits no tenderness.  Abdominal: Soft.  Musculoskeletal:  Normal range of motion.  Neurological: She is alert and oriented to person, place, and time.  Skin: Skin is warm, dry and intact. Capillary refill takes less than 2 seconds. No rash noted. She is not diaphoretic. No erythema. No pallor.  Psychiatric: She has a normal mood and affect. Her speech is normal and behavior is normal. Judgment and thought content normal. Cognition and memory are normal.  Nursing note and vitals reviewed.   Results for orders placed or performed in visit on 06/27/18  CBC with Differential/Platelet  Result Value Ref Range   WBC 7.0 3.4 - 10.8 x10E3/uL   RBC 4.32 3.77 - 5.28 x10E6/uL   Hemoglobin 13.1 11.1 -  15.9 g/dL   Hematocrit 37.8 34.0 - 46.6 %   MCV 88 79 - 97 fL   MCH 30.3 26.6 - 33.0 pg   MCHC 34.7 31.5 - 35.7 g/dL   RDW 14.8 12.3 - 15.4 %   Platelets 215 150 - 450 x10E3/uL   Neutrophils 58 Not Estab. %   Lymphs 31 Not Estab. %   Monocytes 10 Not Estab. %   Eos 1 Not Estab. %   Basos 0 Not Estab. %   Neutrophils Absolute 4.1 1.4 - 7.0 x10E3/uL   Lymphocytes Absolute 2.2 0.7 - 3.1 x10E3/uL   Monocytes Absolute 0.7 0.1 - 0.9 x10E3/uL   EOS (ABSOLUTE) 0.1 0.0 - 0.4 x10E3/uL   Basophils Absolute 0.0 0.0 - 0.2 x10E3/uL   Immature Granulocytes 0 Not Estab. %   Immature Grans (Abs) 0.0 0.0 - 0.1 x10E3/uL  Comprehensive metabolic panel  Result Value Ref Range   Glucose 63 (L) 65 - 99 mg/dL   BUN 19 8 - 27 mg/dL   Creatinine, Ser 0.83 0.57 - 1.00 mg/dL   GFR calc non Af Amer 73 >59 mL/min/1.73   GFR calc Af Amer 84 >59 mL/min/1.73   BUN/Creatinine Ratio 23 12 - 28   Sodium 145 (H) 134 - 144 mmol/L   Potassium 4.5 3.5 - 5.2 mmol/L   Chloride 109 (H) 96 - 106 mmol/L   CO2 20 20 - 29 mmol/L   Calcium 9.5 8.7 - 10.3 mg/dL   Total Protein 6.8 6.0 - 8.5 g/dL   Albumin 4.2 3.6 - 4.8 g/dL   Globulin, Total 2.6 1.5 - 4.5 g/dL   Albumin/Globulin Ratio 1.6 1.2 - 2.2   Bilirubin Total 1.2 0.0 - 1.2 mg/dL   Alkaline Phosphatase 96 39 - 117 IU/L   AST 18 0 - 40 IU/L   ALT 20 0 - 32 IU/L  Lipid Panel w/o Chol/HDL Ratio  Result Value Ref Range   Cholesterol, Total 267 (H) 100 - 199 mg/dL   Triglycerides 616 (HH) 0 - 149 mg/dL   HDL 47 >39 mg/dL   VLDL Cholesterol Cal Comment 5 - 40 mg/dL   LDL Calculated Comment 0 - 99 mg/dL      Assessment & Plan:   Problem List Items Addressed This Visit      Other   Acute posthemorrhagic anemia    Checking labs today. Await results. Call with any concerns. Await results and treat as needed. Follow up with GI as needed.       Relevant Orders   Ferritin   Iron and TIBC    Other Visit Diagnoses    Diarrhea, unspecified type    -  Primary    Significant concern for c-diff due to recent antibiotics. Will check stool studies. Await results.    Relevant  Orders   CBC With Differential/Platelet   Comprehensive metabolic panel   Stool C-Diff Toxin Assay   Ova and parasite examination   Fecal leukocytes   Stool Culture       Follow up plan: Return Pending results.Marland Kitchen

## 2018-07-09 ENCOUNTER — Encounter: Payer: Self-pay | Admitting: Family Medicine

## 2018-07-09 LAB — COMPREHENSIVE METABOLIC PANEL WITH GFR
ALT: 19 IU/L (ref 0–32)
AST: 15 IU/L (ref 0–40)
Albumin/Globulin Ratio: 2.1 (ref 1.2–2.2)
Albumin: 4.6 g/dL (ref 3.6–4.8)
Alkaline Phosphatase: 87 IU/L (ref 39–117)
BUN/Creatinine Ratio: 23 (ref 12–28)
BUN: 21 mg/dL (ref 8–27)
Bilirubin Total: 2 mg/dL — ABNORMAL HIGH (ref 0.0–1.2)
CO2: 23 mmol/L (ref 20–29)
Calcium: 10 mg/dL (ref 8.7–10.3)
Chloride: 104 mmol/L (ref 96–106)
Creatinine, Ser: 0.93 mg/dL (ref 0.57–1.00)
GFR calc Af Amer: 74 mL/min/1.73
GFR calc non Af Amer: 64 mL/min/1.73
Globulin, Total: 2.2 g/dL (ref 1.5–4.5)
Glucose: 94 mg/dL (ref 65–99)
Potassium: 5.4 mmol/L — ABNORMAL HIGH (ref 3.5–5.2)
Sodium: 143 mmol/L (ref 134–144)
Total Protein: 6.8 g/dL (ref 6.0–8.5)

## 2018-07-09 LAB — IRON AND TIBC
Iron Saturation: 36 % (ref 15–55)
Iron: 109 ug/dL (ref 27–139)
Total Iron Binding Capacity: 302 ug/dL (ref 250–450)
UIBC: 193 ug/dL (ref 118–369)

## 2018-07-09 LAB — FERRITIN: FERRITIN: 129 ng/mL (ref 15–150)

## 2018-07-11 DIAGNOSIS — R197 Diarrhea, unspecified: Secondary | ICD-10-CM | POA: Diagnosis not present

## 2018-07-11 NOTE — Addendum Note (Signed)
Addended by: Crissie Figures R on: 07/11/2018 08:24 AM   Modules accepted: Orders

## 2018-07-12 ENCOUNTER — Other Ambulatory Visit: Payer: Self-pay | Admitting: Family Medicine

## 2018-07-12 DIAGNOSIS — R197 Diarrhea, unspecified: Secondary | ICD-10-CM | POA: Diagnosis not present

## 2018-07-13 ENCOUNTER — Telehealth: Payer: Self-pay | Admitting: Family Medicine

## 2018-07-13 LAB — CLOSTRIDIUM DIFFICILE EIA: C difficile Toxins A+B, EIA: NEGATIVE

## 2018-07-13 NOTE — Telephone Encounter (Signed)
I'd like her to call her GI doctor and see if she can get in sooner.

## 2018-07-13 NOTE — Telephone Encounter (Signed)
Patient notified, she states that she will contact them.

## 2018-07-13 NOTE — Telephone Encounter (Signed)
Please let her know that her c. Diff test was negative- can we find out how she's doing with her diarrhea?

## 2018-07-13 NOTE — Telephone Encounter (Signed)
Patient notified, she states that she is still having problems with the diarrhea, she is going about 6-8 times a day.

## 2018-07-14 ENCOUNTER — Telehealth: Payer: Self-pay

## 2018-07-14 NOTE — Telephone Encounter (Signed)
Pharmacy is requesting a prescription fir pantoprazole 40mg  1 tablet BID, I do not see it on current med list looks like it was written by the GI doctor last

## 2018-07-15 ENCOUNTER — Ambulatory Visit: Payer: Medicare Other

## 2018-07-18 LAB — STOOL CULTURE: E coli, Shiga toxin Assay: NEGATIVE

## 2018-07-18 LAB — OVA AND PARASITE EXAMINATION

## 2018-07-18 LAB — FECAL LEUKOCYTES

## 2018-07-18 NOTE — Telephone Encounter (Signed)
Please have her follow up with GI for this- she was supposed to be taking this as treatment for her H. Pylori and I'm not sure she is supposed to continue it.

## 2018-07-18 NOTE — Telephone Encounter (Signed)
Called and left a detailed message letting the patient know that she will need to contact GI.

## 2018-07-19 ENCOUNTER — Ambulatory Visit
Admission: RE | Admit: 2018-07-19 | Discharge: 2018-07-19 | Disposition: A | Discharge status: Home / Self Care | Payer: Medicare Other | Source: Ambulatory Visit | Attending: Nurse Practitioner | Admitting: Nurse Practitioner

## 2018-07-19 DIAGNOSIS — I2510 Atherosclerotic heart disease of native coronary artery without angina pectoris: Secondary | ICD-10-CM | POA: Diagnosis not present

## 2018-07-19 DIAGNOSIS — Z87891 Personal history of nicotine dependence: Secondary | ICD-10-CM | POA: Diagnosis not present

## 2018-07-19 DIAGNOSIS — I700 Atherosclerosis of aorta: Secondary | ICD-10-CM | POA: Diagnosis not present

## 2018-07-19 DIAGNOSIS — J432 Centrilobular emphysema: Secondary | ICD-10-CM | POA: Diagnosis not present

## 2018-07-19 DIAGNOSIS — Z122 Encounter for screening for malignant neoplasm of respiratory organs: Secondary | ICD-10-CM | POA: Insufficient documentation

## 2018-07-19 DIAGNOSIS — J438 Other emphysema: Secondary | ICD-10-CM | POA: Diagnosis not present

## 2018-07-20 ENCOUNTER — Encounter: Payer: Self-pay | Admitting: *Deleted

## 2018-07-24 ENCOUNTER — Other Ambulatory Visit: Payer: Self-pay | Admitting: Family Medicine

## 2018-07-25 NOTE — Telephone Encounter (Signed)
Request from pharmacy to refill Zoloft 50 MG daily. Medication was discontinued on 06/14/18 by PCP. Office visit on 06/27/18 noted to continue current treatment for depression. Medication not on current profile.  TC to patient to inquire whether taking Zoloft or not.

## 2018-07-25 NOTE — Telephone Encounter (Signed)
Patient calling and states that she does not take the zoloft any longer

## 2018-08-16 ENCOUNTER — Encounter: Payer: Self-pay | Admitting: Gastroenterology

## 2018-08-16 ENCOUNTER — Ambulatory Visit (INDEPENDENT_AMBULATORY_CARE_PROVIDER_SITE_OTHER): Payer: Medicare Other | Admitting: Gastroenterology

## 2018-08-16 VITALS — BP 122/55 | HR 61 | Ht 63.0 in | Wt 135.2 lb

## 2018-08-16 DIAGNOSIS — Z1211 Encounter for screening for malignant neoplasm of colon: Secondary | ICD-10-CM

## 2018-08-16 DIAGNOSIS — R195 Other fecal abnormalities: Secondary | ICD-10-CM | POA: Diagnosis not present

## 2018-08-16 DIAGNOSIS — Z8619 Personal history of other infectious and parasitic diseases: Secondary | ICD-10-CM

## 2018-08-16 NOTE — Progress Notes (Signed)
Tonya Antigua, MD [address]  [place]  [place], [place] [address]  Main: [phone]  Fax: [phone]   Primary Care Physician: Valerie Roys, DO  Primary Gastroenterologist:  Dr. Vonda Cabrera  No chief complaint on file.   HPI: Tonya Cabrera is a 67 y.o. female with history of gastric ulcer and H. pylori here for follow-up.  Patient states she feels great. The patient denies abdominal or flank pain, anorexia, nausea or vomiting, dysphagia, black or bloody stools or weight loss.  Does report loose stools.  States this is chronic.  Has anywhere from 3-5 loose bowel movements a day.  This has not changed.  She underwent stool cultures and C. difficile testing recently by primary care provider and this was negative.  She uses Imodium once or twice a week and this resolves her symptoms.  She has not had a colonoscopy.   Initial EGD May 2019 for anemia and melena 1 nonbleeding cratered gastric ulcer with black pigmented spot in the gastric antrum.  7 mm in largest dimension.  Treated with APC.  Duodenal erythema also reported.  Patient placed on PPI twice daily  Repeat EGD, September 2019 Gastric antrum showed healed ulcer site with healthy scar tissue.  Biopsy showed H. pylori on IHC stain.  Focal intestinal metaplasia was reported.  Patient has previously refused screening colonoscopy  Hemoglobin improved to 13.3 on October 2019 labs  Current Outpatient Medications  Medication Sig Dispense Refill  . alendronate (FOSAMAX) 70 MG tablet TAKE ONE TABLET BY MOUTH EVERY 7 DAYS ON EMPTY STOMACH WITH FULL GLASS OF WATER  11  . aspirin 81 MG EC tablet TAKE 1 TABLET (81 MG TOTAL) BY MOUTH DAILY. 90 tablet 4  . atorvastatin (LIPITOR) 40 MG tablet Take 1 tablet (40 mg total) by mouth daily. DO NOT TAKE WHILE ON CLARITHROMYCIN 90 tablet 1  . lisinopril (PRINIVIL,ZESTRIL) 10 MG tablet Take 1 tablet (10 mg total) by mouth daily. 90 tablet 1  . metoprolol succinate  (TOPROL-XL) 25 MG 24 hr tablet Take 1 tablet (25 mg total) by mouth 2 (two) times daily. 180 tablet 1   No current facility-administered medications for this visit.     Allergies as of 08/16/2018  . (No Known Allergies)    ROS:  General: Negative for anorexia, weight loss, fever, chills, fatigue, weakness. ENT: Negative for hoarseness, difficulty swallowing , nasal congestion. CV: Negative for chest pain, angina, palpitations, dyspnea on exertion, peripheral edema.  Respiratory: Negative for dyspnea at rest, dyspnea on exertion, cough, sputum, wheezing.  GI: See history of present illness. GU:  Negative for dysuria, hematuria, urinary incontinence, urinary frequency, nocturnal urination.  Endo: Negative for unusual weight change.    Physical Examination:  Vitals:   08/16/18 1314  BP: (!) 122/55  Pulse: 61  Weight: 135 lb 3.2 oz (61.3 kg)  Height: 5\' 3"  (1.6 m)    General: Well-nourished, well-developed in no acute distress.  Eyes: No icterus. Conjunctivae pink. Mouth: Oropharyngeal mucosa moist and pink , no lesions erythema or exudate. Neck: Supple, Trachea midline Abdomen: Bowel sounds are normal, nontender, nondistended, no hepatosplenomegaly or masses, no abdominal bruits or hernia , no rebound or guarding.   Extremities: No lower extremity edema. No clubbing or deformities. Neuro: Alert and oriented x 3.  Grossly intact. Skin: Warm and dry, no jaundice.   Psych: Alert and cooperative, normal mood and affect.   Labs: CMP     Component Value Date/Time   NA 143  07/08/2018 1138   NA 144 05/16/2014 0020   K 5.4 (H) 07/08/2018 1138   K 3.9 05/16/2014 0020   CL 104 07/08/2018 1138   CL 109 (H) 05/16/2014 0020   CO2 23 07/08/2018 1138   CO2 23 05/16/2014 0020   GLUCOSE 94 07/08/2018 1138   GLUCOSE 91 02/20/2018 0531   GLUCOSE 121 (H) 05/16/2014 0020   BUN 21 07/08/2018 1138   BUN 9 05/16/2014 0020   CREATININE 0.93 07/08/2018 1138   CREATININE 0.90 05/16/2014  0020   CALCIUM 10.0 07/08/2018 1138   CALCIUM 8.2 (L) 05/16/2014 0020   PROT 6.8 07/08/2018 1138   PROT 7.5 05/15/2014 0842   ALBUMIN 4.6 07/08/2018 1138   ALBUMIN 4.1 05/15/2014 0842   AST 15 07/08/2018 1138   AST 89 (H) 05/15/2014 0842   ALT 19 07/08/2018 1138   ALT 22 05/15/2014 0842   ALKPHOS 87 07/08/2018 1138   ALKPHOS 108 05/15/2014 0842   BILITOT 2.0 (H) 07/08/2018 1138   BILITOT 1.0 05/15/2014 0842   GFRNONAA 64 07/08/2018 1138   GFRNONAA >60 05/16/2014 0020   GFRAA 74 07/08/2018 1138   GFRAA >60 05/16/2014 0020   Lab Results  Component Value Date   WBC 7.4 07/08/2018   HGB 13.3 07/08/2018   HCT 36.7 07/08/2018   MCV 86 07/08/2018   PLT 231 07/08/2018    Imaging Studies: Ct Chest Lung Ca Screen Low Dose W/o Cm  Result Date: 07/19/2018 CLINICAL DATA:  67 year old female former smoker (quit 4 years ago) with 33 pack-year history of smoking. Lung cancer screening examination. EXAM: CT CHEST WITHOUT CONTRAST LOW-DOSE FOR LUNG CANCER SCREENING TECHNIQUE: Multidetector CT imaging of the chest was performed following the standard protocol without IV contrast. COMPARISON:  Low-dose lung cancer screening chest CT 07/16/2017. FINDINGS: Cardiovascular: Heart size is normal. There is no significant pericardial fluid, thickening or pericardial calcification. There is aortic atherosclerosis, as well as atherosclerosis of the great vessels of the mediastinum and the coronary arteries, including calcified atherosclerotic plaque in the left main, left anterior descending, left circumflex and right coronary arteries. Mediastinum/Nodes: No pathologically enlarged mediastinal or hilar lymph nodes. Please note that accurate exclusion of hilar adenopathy is limited on noncontrast CT scans. Esophagus is unremarkable in appearance. No axillary lymphadenopathy. Lungs/Pleura: Multiple pulmonary nodules are noted throughout the lungs bilaterally, similar in size, number and distribution to the prior  study, with the largest of these in the apex of the right upper lobe medially (axial image 32 of series 3), with a volume derived mean diameter 5.9 mm. No larger more suspicious appearing pulmonary nodules or masses are noted. No acute consolidative airspace disease. No pleural effusions. Mild diffuse bronchial wall thickening with very mild centrilobular and paraseptal emphysema. Upper Abdomen: Aortic atherosclerosis. 15 mm low-attenuation lesion in segment 2 of the liver, incompletely characterized on today's noncontrast CT examination, but similar to prior studies and statistically likely a cyst. Musculoskeletal: There are no aggressive appearing lytic or blastic lesions noted in the visualized portions of the skeleton. IMPRESSION: 1. Lung-RADS 2S, benign appearance or behavior. Continue annual screening with low-dose chest CT without contrast in 12 months. 2. The "S" modifier above refers to potentially clinically significant non lung cancer related findings. Specifically, there is aortic atherosclerosis, in addition to left main and 3 vessel coronary artery disease. Please note that although the presence of coronary artery calcium documents the presence of coronary artery disease, the severity of this disease and any potential stenosis cannot be assessed  on this non-gated CT examination. Assessment for potential risk factor modification, dietary therapy or pharmacologic therapy may be warranted, if clinically indicated. 3. Mild diffuse bronchial wall thickening with very mild centrilobular and paraseptal emphysema; imaging findings suggestive of underlying COPD. Aortic Atherosclerosis (ICD10-I70.0) and Emphysema (ICD10-J43.9). Electronically Signed   By: Vinnie Langton M.D.   On: 07/19/2018 15:36    Assessment and Plan:   Tonya Cabrera is a 67 y.o. y/o female with history of melena and anemia, gastric ulcer and H. pylori here for follow-up  Status post triple therapy for H. pylori Patient has  completed therapy No further symptoms  EGD reported focal intestinal metaplasia in September 2019 biopsies were mapping biopsies This was likely related to her H. pylori which has been treated Repeat biopsies will also allow [place] to evaluate for eradication of H. pylori  Patient is agreeable to proceeding with EGD  Her loose stools are chronic and states they were present prior to her triple therapy as well We have discussed colonoscopy both for screening and to evaluate for microscopic colitis and she is agreeable with proceeding with the colonoscopy as well  I have discussed alternative options, risks & benefits,  which include, but are not limited to, bleeding, infection, perforation,respiratory complication & drug reaction.  The patient agrees with this plan & written consent will be obtained.       Dr Tonya Cabrera

## 2018-09-03 ENCOUNTER — Other Ambulatory Visit: Payer: Self-pay | Admitting: Family Medicine

## 2018-09-05 NOTE — Telephone Encounter (Signed)
Requested medication (s) are due for refill today: Yes  Requested medication (s) are on the active medication list: Yes  Last refill:  06/22/17  Future visit scheduled: n/a  Notes to clinic:  Request states pt. Is no longer taking med.    Requested Prescriptions  Pending Prescriptions Disp Refills   alendronate (FOSAMAX) 70 MG tablet [Pharmacy Med Name: ALENDRONATE SODIUM 70 MG TAB] 12 tablet 3    Sig: TAKE ONE TABLET BY MOUTH EVERY 7 DAYS ON EMPTY STOMACH WITH FULL GLASS OF WATER     Endocrinology:  Bisphosphonates Failed - 09/03/2018  1:04 AM      Failed - Vit D in normal range and within 360 days    No results found for: [id number], [id number], [id number], 25OHVITD3, 25OHVITD2, 25OHVITD3, 25OHVITD2, 25OHVITD1, 25OHVITD2, 25OHVITD3, VD25OH       Passed - Ca in normal range and within 360 days    Calcium  Date Value Ref Range Status  07/08/2018 10.0 8.7 - 10.3 mg/dL Final   Calcium, Total  Date Value Ref Range Status  05/16/2014 8.2 (L) 8.5 - 10.1 mg/dL Final         Passed - Valid encounter within last 12 months    Recent Outpatient Visits          1 month ago Diarrhea, unspecified type   [place], Megan P, DO   2 months ago Essential hypertension   [place], [place], DO   6 months ago [place], [place], DO   8 months ago Benign hypertensive renal disease   [facility] Joshua, Megan P, DO   1 year ago Routine general medical examination at a health care facility   [place], [place], DO      Future Appointments            In 3 months  [place], [place]   In 3 months Wynetta Emery, Barb Merino, DO [employer], [place]         Yes

## 2018-10-06 ENCOUNTER — Encounter
Admission: RE | Disposition: A | Discharge status: Home / Self Care | Payer: Self-pay | Source: Home / Self Care | Attending: Gastroenterology

## 2018-10-06 ENCOUNTER — Ambulatory Visit: Payer: Medicare Other | Admitting: Certified Registered"

## 2018-10-06 ENCOUNTER — Other Ambulatory Visit: Payer: Self-pay | Admitting: Gastroenterology

## 2018-10-06 ENCOUNTER — Encounter: Payer: Self-pay | Admitting: *Deleted

## 2018-10-06 ENCOUNTER — Ambulatory Visit
Admission: RE | Admit: 2018-10-06 | Discharge: 2018-10-06 | Disposition: A | Discharge status: Home / Self Care | Payer: Medicare Other | Attending: Gastroenterology | Admitting: Gastroenterology

## 2018-10-06 DIAGNOSIS — K3189 Other diseases of stomach and duodenum: Secondary | ICD-10-CM

## 2018-10-06 DIAGNOSIS — K5730 Diverticulosis of large intestine without perforation or abscess without bleeding: Secondary | ICD-10-CM

## 2018-10-06 DIAGNOSIS — D126 Benign neoplasm of colon, unspecified: Secondary | ICD-10-CM | POA: Diagnosis not present

## 2018-10-06 DIAGNOSIS — Z8619 Personal history of other infectious and parasitic diseases: Secondary | ICD-10-CM | POA: Diagnosis not present

## 2018-10-06 DIAGNOSIS — Z1211 Encounter for screening for malignant neoplasm of colon: Secondary | ICD-10-CM | POA: Diagnosis not present

## 2018-10-06 DIAGNOSIS — K2970 Gastritis, unspecified, without bleeding: Secondary | ICD-10-CM | POA: Diagnosis not present

## 2018-10-06 DIAGNOSIS — Z87891 Personal history of nicotine dependence: Secondary | ICD-10-CM | POA: Diagnosis not present

## 2018-10-06 DIAGNOSIS — K228 Other specified diseases of esophagus: Secondary | ICD-10-CM | POA: Insufficient documentation

## 2018-10-06 DIAGNOSIS — K2950 Unspecified chronic gastritis without bleeding: Secondary | ICD-10-CM | POA: Insufficient documentation

## 2018-10-06 DIAGNOSIS — K2960 Other gastritis without bleeding: Secondary | ICD-10-CM | POA: Diagnosis not present

## 2018-10-06 DIAGNOSIS — I10 Essential (primary) hypertension: Secondary | ICD-10-CM | POA: Insufficient documentation

## 2018-10-06 DIAGNOSIS — B9681 Helicobacter pylori [H. pylori] as the cause of diseases classified elsewhere: Secondary | ICD-10-CM

## 2018-10-06 DIAGNOSIS — K5790 Diverticulosis of intestine, part unspecified, without perforation or abscess without bleeding: Secondary | ICD-10-CM | POA: Diagnosis not present

## 2018-10-06 DIAGNOSIS — Z7982 Long term (current) use of aspirin: Secondary | ICD-10-CM | POA: Insufficient documentation

## 2018-10-06 DIAGNOSIS — Z8673 Personal history of transient ischemic attack (TIA), and cerebral infarction without residual deficits: Secondary | ICD-10-CM | POA: Insufficient documentation

## 2018-10-06 DIAGNOSIS — I2510 Atherosclerotic heart disease of native coronary artery without angina pectoris: Secondary | ICD-10-CM | POA: Insufficient documentation

## 2018-10-06 DIAGNOSIS — M810 Age-related osteoporosis without current pathological fracture: Secondary | ICD-10-CM | POA: Insufficient documentation

## 2018-10-06 DIAGNOSIS — B3781 Candidal esophagitis: Secondary | ICD-10-CM | POA: Diagnosis not present

## 2018-10-06 DIAGNOSIS — E7800 Pure hypercholesterolemia, unspecified: Secondary | ICD-10-CM | POA: Diagnosis not present

## 2018-10-06 DIAGNOSIS — D120 Benign neoplasm of cecum: Secondary | ICD-10-CM

## 2018-10-06 DIAGNOSIS — I252 Old myocardial infarction: Secondary | ICD-10-CM | POA: Diagnosis not present

## 2018-10-06 DIAGNOSIS — Z7983 Long term (current) use of bisphosphonates: Secondary | ICD-10-CM | POA: Diagnosis not present

## 2018-10-06 DIAGNOSIS — Z79899 Other long term (current) drug therapy: Secondary | ICD-10-CM | POA: Diagnosis not present

## 2018-10-06 DIAGNOSIS — K635 Polyp of colon: Secondary | ICD-10-CM | POA: Diagnosis not present

## 2018-10-06 HISTORY — PX: COLONOSCOPY WITH PROPOFOL: SHX5780

## 2018-10-06 HISTORY — PX: ESOPHAGOGASTRODUODENOSCOPY (EGD) WITH PROPOFOL: SHX5813

## 2018-10-06 LAB — KOH PREP: Special Requests: NORMAL

## 2018-10-06 SURGERY — COLONOSCOPY WITH PROPOFOL
Anesthesia: General

## 2018-10-06 MED ORDER — PROPOFOL 10 MG/ML IV BOLUS
INTRAVENOUS | Status: DC | PRN
  Administered 2018-10-06: 40 mg via INTRAVENOUS
  Administered 2018-10-06 (×3): 5 mg via INTRAVENOUS
  Administered 2018-10-06 (×2): 20 mg via INTRAVENOUS

## 2018-10-06 MED ORDER — PROPOFOL 500 MG/50ML IV EMUL
INTRAVENOUS | Status: DC | PRN
  Administered 2018-10-06: 120 ug/kg/min via INTRAVENOUS

## 2018-10-06 MED ORDER — PROPOFOL 10 MG/ML IV BOLUS
INTRAVENOUS | Status: AC
  Filled 2018-10-06: qty 20

## 2018-10-06 MED ORDER — PROPOFOL 500 MG/50ML IV EMUL
INTRAVENOUS | Status: AC
  Filled 2018-10-06: qty 50

## 2018-10-06 MED ORDER — FLUCONAZOLE 200 MG PO TABS: ORAL_TABLET | ORAL | 0 refills | Status: AC

## 2018-10-06 MED ORDER — SODIUM CHLORIDE 0.9 % IV SOLN
INTRAVENOUS | Status: DC
  Administered 2018-10-06: 08:00:00 via INTRAVENOUS

## 2018-10-06 NOTE — Anesthesia Postprocedure Evaluation (Signed)
Anesthesia Post Note  Patient: Tonya Cabrera  Procedure(s) Performed: COLONOSCOPY WITH PROPOFOL (N/A ) ESOPHAGOGASTRODUODENOSCOPY (EGD) WITH PROPOFOL (N/A )  Patient location during evaluation: PACU Anesthesia Type: General Level of consciousness: awake and alert Pain management: pain level controlled Vital Signs Assessment: post-procedure vital signs reviewed and stable Respiratory status: spontaneous breathing, nonlabored ventilation, respiratory function stable and patient connected to nasal cannula oxygen Cardiovascular status: blood pressure returned to baseline and stable Postop Assessment: no apparent nausea or vomiting Anesthetic complications: no     Last Vitals:  Vitals:   10/06/18 0915 10/06/18 0925  BP: (!) 129/56 102/64  Pulse: 81 72  Resp: 19 16  Temp: (!) 36.2 C   SpO2: 100% 98%    Last Pain:  Vitals:   10/06/18 0925  TempSrc:   PainSc: 0-No pain                 Molli Barrows

## 2018-10-06 NOTE — H&P (Signed)
[name], MD [address], [place], [place], [place], [address] [address], [place], [place], [place], [address] Phone: [phone]  Fax: [phone]  Primary Care Physician:  Valerie Roys, DO   Pre-Procedure History & Physical: HPI:  Tonya Cabrera is a 68 y.o. female is here for a colonoscopy and EGD.   Past Medical History:  Diagnosis Date  . Arthritis   . Coronary artery disease    coronary stent  . Dyspnea   . Hard of hearing   . Heart murmur   . Hypercholesteremia   . Hypertension   . Myocardial infarction ([place])    2015  . Osteoporosis   . Stroke [facility])    10-12 years ago    Past Surgical History:  Procedure Laterality Date  . CAROTID STENT    . CATARACT EXTRACTION W/PHACO Right 11/09/2017   Procedure: CATARACT EXTRACTION PHACO AND INTRAOCULAR LENS PLACEMENT ([place]) RIGHT;  Surgeon: Eulogio Bear, MD;  Location: [place];  Service: Ophthalmology;  Laterality: Right;  . CATARACT EXTRACTION W/PHACO Left 11/23/2017   Procedure: CATARACT EXTRACTION PHACO AND INTRAOCULAR LENS PLACEMENT ([place]) LEFT;  Surgeon: Eulogio Bear, MD;  Location: [facility];  Service: Ophthalmology;  Laterality: Left;  . ESOPHAGOGASTRODUODENOSCOPY (EGD) WITH PROPOFOL N/A 02/22/2018   Procedure: ESOPHAGOGASTRODUODENOSCOPY (EGD) WITH PROPOFOL;  Surgeon: Virgel Manifold, MD;  Location: [facility] ENDOSCOPY;  Service: Endoscopy;  Laterality: N/A;  . ESOPHAGOGASTRODUODENOSCOPY (EGD) WITH PROPOFOL N/A 06/08/2018   Procedure: ESOPHAGOGASTRODUODENOSCOPY (EGD) WITH PROPOFOL;  Surgeon: Virgel Manifold, MD;  Location: [facility] ENDOSCOPY;  Service: Endoscopy;  Laterality: N/A;  . INCONTINENCE SURGERY    . THYROID SURGERY      Prior to Admission medications   Medication Sig Start Date End Date Taking? Authorizing Provider  aspirin 81 MG EC tablet TAKE 1 TABLET (81 MG TOTAL) BY MOUTH DAILY. 12/23/17  Yes Johnson, Megan P, DO  lisinopril (PRINIVIL,ZESTRIL) 10 MG  tablet Take 1 tablet (10 mg total) by mouth daily. 06/27/18  Yes Johnson, Megan P, DO  metoprolol succinate (TOPROL-XL) 25 MG 24 hr tablet Take 1 tablet (25 mg total) by mouth 2 (two) times daily. 06/27/18  Yes Johnson, Megan P, DO  alendronate (FOSAMAX) 70 MG tablet TAKE ONE TABLET BY MOUTH EVERY 7 DAYS ON EMPTY STOMACH WITH FULL GLASS OF WATER 03/19/18   [provider]  alendronate (FOSAMAX) 70 MG tablet TAKE ONE TABLET BY MOUTH EVERY 7 DAYS ON EMPTY STOMACH WITH FULL GLASS OF WATER 09/05/18   Wynetta Emery, Megan P, DO  atorvastatin (LIPITOR) 40 MG tablet Take 1 tablet (40 mg total) by mouth daily. DO NOT TAKE WHILE ON CLARITHROMYCIN 06/27/18   [place] Liter P, DO    Allergies as of 08/17/2018  . (No Known Allergies)    Family History  Problem Relation Age of Onset  . Liver cancer Brother   . Heart Problems Mother   . Breast cancer Neg Hx     Social History   Socioeconomic History  . Marital status: Divorced    Spouse name: Not on file  . Number of children: Not on file  . Years of education: Not on file  . Highest education level: Not on file  Occupational History  . Not on file  Social Needs  . Financial resource strain: Not on file  . Food insecurity:    Worry: Not on file    Inability: Not on file  . Transportation needs:    Medical: Not on file  Non-medical: Not on file  Tobacco Use  . Smoking status: Former Smoker    Packs/day: 1.50    Years: 22.00    Pack years: 33.00    Last attempt to quit: 05/08/2014    Years since quitting: 4.4  . Smokeless tobacco: Never Used  . Tobacco comment: Smoked over 25 years, quit after Heart Attack in 2015.    Substance and Sexual Activity  . Alcohol use: No    Alcohol/week: 0.0 standard drinks  . Drug use: No  . Sexual activity: Not Currently  Lifestyle  . Physical activity:    Days per week: Not on file    Minutes per session: Not on file  . Stress: Not on file  Relationships  . Social connections:    Talks on phone:  Not on file    Gets together: Not on file    Attends religious service: Not on file    Active member of club or organization: Not on file    Attends meetings of clubs or organizations: Not on file    Relationship status: Not on file  . Intimate partner violence:    Fear of current or ex partner: Not on file    Emotionally abused: Not on file    Physically abused: Not on file    Forced sexual activity: Not on file  Other Topics Concern  . Not on file  Social History Narrative  . Not on file    Review of Systems: See HPI, otherwise negative ROS  Physical Exam: BP (!) 143/68   Pulse 74   Temp 98.2 F (36.8 C) (Tympanic)   Resp 16   Ht 5\' 3"  (1.6 m)   Wt 59.9 kg   SpO2 100%   BMI 23.38 kg/m  General:   Alert,  pleasant and cooperative in NAD Head:  Normocephalic and atraumatic. Neck:  Supple; no masses or thyromegaly. Lungs:  Clear throughout to auscultation, normal respiratory effort.    Heart:  +S1, +S2, Regular rate and rhythm, No edema. Abdomen:  Soft, nontender and nondistended. Normal bowel sounds, without guarding, and without rebound.   Neurologic:  Alert and  oriented x4;  grossly normal neurologically.  Impression/Plan: Tonya Cabrera is here for a colonoscopy to be performed for average risk screening and EGD for gastric metaplasia.  Risks, benefits, limitations, and alternatives regarding the procedures have been reviewed with the patient.  Questions have been answered.  All parties agreeable.   Virgel Manifold, MD  10/06/2018, 8:12 AM

## 2018-10-06 NOTE — Op Note (Signed)
[facility] Gastroenterology Patient Name: Tonya Cabrera Procedure Date: 10/06/2018 7:28 AM MRN: [id number] Account #: [id number] Date of Birth: [birth year] Admit Type: Outpatient Age: 68 Room: [facility] ENDO ROOM 3 Gender: Female Note Status: Finalized Procedure:            Colonoscopy Indications:          Screening for colorectal malignant neoplasm Providers:            Varnita B. Bonna Gains MD, MD Medicines:            Monitored Anesthesia Care Complications:        No immediate complications. Procedure:            Pre-Anesthesia Assessment:                       - Prior to the procedure, a History and Physical was                        performed, and patient medications, allergies and                        sensitivities were reviewed. The patient's tolerance of                        previous anesthesia was reviewed.                       - The risks and benefits of the procedure and the                        sedation options and risks were discussed with the                        patient. All questions were answered and informed                        consent was obtained.                       - Patient identification and proposed procedure were                        verified prior to the procedure by the physician, the                        nurse, the anesthesiologist, the anesthetist and the                        technician. The procedure was verified in the procedure                        room.                       - ASA Grade Assessment: II - A patient with mild                        systemic disease.                       After obtaining informed consent, the colonoscope was  passed under direct vision. Throughout the procedure,                        the patient's blood pressure, pulse, and oxygen                        saturations were monitored continuously. The                        Colonoscope was introduced through the  anus and                        advanced to the the cecum, identified by appendiceal                        orifice and ileocecal valve. The colonoscopy was                        performed with ease. The patient tolerated the                        procedure well. The quality of the bowel preparation                        was good except the ascending colon was fair. Cleaned                        well with water and suctioning. Findings:      The perianal and digital rectal examinations were normal.      A 5 mm polyp was found in the cecum. The polyp was flat. The polyp was       removed with a cold biopsy forceps. Resection and retrieval were       complete.      A single diverticulum was found in the sigmoid colon.      The exam was otherwise without abnormality.      The rectum, sigmoid colon, descending colon, transverse colon, ascending       colon and cecum appeared normal.      The retroflexed view of the distal rectum and anal verge was normal and       showed no anal or rectal abnormalities. Impression:           - One 5 mm polyp in the cecum, removed with a cold                        biopsy forceps. Resected and retrieved.                       - Diverticulosis in the sigmoid colon.                       - The examination was otherwise normal.                       - The rectum, sigmoid colon, descending colon,                        transverse colon, ascending colon and cecum are normal.                       -  The distal rectum and anal verge are normal on                        retroflexion view. Recommendation:       - Discharge patient to home (with escort).                       - Advance diet as tolerated.                       - Continue present medications.                       - Await pathology results.                       - Repeat colonoscopy in 5 years (fair prep on this exam                        but cleaned well with water and suctioning).                        - The findings and recommendations were discussed with                        the patient.                       - The findings and recommendations were discussed with                        the patient's family.                       - Return to primary care physician as previously                        scheduled.                       - High fiber diet. Procedure Code(s):    --- Professional ---                       [phone], Colonoscopy, flexible; with biopsy, single or                        multiple Diagnosis Code(s):    --- Professional ---                       Z12.11, Encounter for screening for malignant neoplasm                        of colon                       D12.0, Benign neoplasm of cecum                       K57.30, Diverticulosis of large intestine without                        perforation or abscess without bleeding CPT copyright 2018 American Medical Association. All rights reserved. The codes documented  in this report are preliminary and upon coder review may  be revised to meet current compliance requirements.  [name], MD Margretta Sidle B. Bonna Gains MD, MD 10/06/2018 9:17:58 AM This report has been signed electronically. Number of Addenda: 0 Note Initiated On: 10/06/2018 7:28 AM Scope Withdrawal Time: 0 hours 20 minutes 12 seconds  Total Procedure Duration: 0 hours 32 minutes 29 seconds  Estimated Blood Loss: Estimated blood loss: none.      [facility]

## 2018-10-06 NOTE — Transfer of Care (Signed)
Immediate Anesthesia Transfer of Care Note  Patient: Tonya Cabrera  Procedure(s) Performed: COLONOSCOPY WITH PROPOFOL (N/A ) ESOPHAGOGASTRODUODENOSCOPY (EGD) WITH PROPOFOL (N/A )  Patient Location: PACU and Endoscopy Unit  Anesthesia Type:General  Level of Consciousness: awake, oriented and patient cooperative  Airway & Oxygen Therapy: Patient connected to nasal cannula oxygen  Post-op Assessment: Report given to RN and Post -op Vital signs reviewed and stable  Post vital signs: Reviewed and stable  Last Vitals:  Vitals Value Taken Time  BP 129/56 10/06/2018  9:15 AM  Temp 36.2 C 10/06/2018  9:15 AM  Pulse 76 10/06/2018  9:17 AM  Resp 19 10/06/2018  9:17 AM  SpO2 100 % 10/06/2018  9:17 AM  Vitals shown include unvalidated device data.  Last Pain:  Vitals:   10/06/18 0915  TempSrc: Tympanic  PainSc: Asleep         Complications: No apparent anesthesia complications

## 2018-10-06 NOTE — Anesthesia Post-op Follow-up Note (Signed)
Anesthesia QCDR form completed.        

## 2018-10-06 NOTE — Op Note (Signed)
[facility] Gastroenterology Patient Name: Tonya Cabrera Procedure Date: 10/06/2018 7:28 AM MRN: [id number] Account #: [id number] Date of Birth: [birth year] Admit Type: Outpatient Age: 68 Room: [facility] ENDO ROOM 3 Gender: Female Note Status: Finalized Procedure:            Upper GI endoscopy Indications:          Follow-up of Helicobacter pylori, Follow-up of                        intestinal metaplasia Providers:            Kaylin Schellenberg B. Bonna Gains MD, MD Referring MD:         Valerie Roys (Referring MD) Medicines:            Monitored Anesthesia Care Complications:        No immediate complications. Procedure:            Pre-Anesthesia Assessment:                       - Prior to the procedure, a History and Physical was                        performed, and patient medications, allergies and                        sensitivities were reviewed. The patient's tolerance of                        previous anesthesia was reviewed.                       - The risks and benefits of the procedure and the                        sedation options and risks were discussed with the                        patient. All questions were answered and informed                        consent was obtained.                       - Patient identification and proposed procedure were                        verified prior to the procedure by the physician, the                        nurse, the anesthesiologist, the anesthetist and the                        technician. The procedure was verified in the procedure                        room.                       - ASA Grade Assessment: II - A patient with mild  systemic disease.                       After obtaining informed consent, the endoscope was                        passed under direct vision. Throughout the procedure,                        the patient's blood pressure, pulse, and oxygen   saturations were monitored continuously. The Endoscope                        was introduced through the mouth, and advanced to the                        second part of duodenum. The upper GI endoscopy was                        accomplished with ease. The patient tolerated the                        procedure well. Findings:      White nummular lesions were noted in the mid esophagus. Brushings for       KOH prep were obtained.      The exam of the esophagus was otherwise normal.      Patchy mildly erythematous mucosa without bleeding was found in the       gastric antrum. Biopsies were obtained on the greater curvature of the       gastric body, on the lesser curvature of the gastric body, at the       incisura, on the greater curvature of the gastric antrum and on the       lesser curvature of the gastric antrum with cold forceps for histology.      There is no endoscopic evidence of ulceration in the entire examined       stomach.      The duodenal bulb, second portion of the duodenum and examined duodenum       were normal. Impression:           - White nummular lesions in esophageal mucosa.                        Brushings performed.                       - Erythematous mucosa in the antrum.                       - Normal duodenal bulb, second portion of the duodenum                        and examined duodenum.                       - Biopsies were obtained on the greater curvature of                        the gastric body, on the lesser curvature of the  gastric body, at the incisura, on the greater curvature                        of the gastric antrum and on the lesser curvature of                        the gastric antrum. Recommendation:       - Await pathology results.                       - Discharge patient to home (with escort).                       - Advance diet as tolerated.                       - Continue present medications.                        - Patient has a contact number available for                        emergencies. The signs and symptoms of potential                        delayed complications were discussed with the patient.                        Return to normal activities tomorrow. Written discharge                        instructions were provided to the patient.                       - Discharge patient to home (with escort).                       - The findings and recommendations were discussed with                        the patient.                       - The findings and recommendations were discussed with                        the patient's family. Procedure Code(s):    --- Professional ---                       [phone], Esophagogastroduodenoscopy, flexible, transoral;                        with biopsy, single or multiple Diagnosis Code(s):    --- Professional ---                       K22.8, Other specified diseases of esophagus                       K31.89, Other diseases of stomach and duodenum  Y59.09, Helicobacter pylori [H. pylori] as the cause of                        diseases classified elsewhere CPT copyright 2018 American Medical Association. All rights reserved. The codes documented in this report are preliminary and upon coder review may  be revised to meet current compliance requirements.  [name], MD Margretta Sidle B. Bonna Gains MD, MD 10/06/2018 8:36:07 AM This report has been signed electronically. Number of Addenda: 0 Note Initiated On: 10/06/2018 7:28 AM Estimated Blood Loss: Estimated blood loss: none.      [facility]

## 2018-10-06 NOTE — Anesthesia Preprocedure Evaluation (Signed)
Anesthesia Evaluation  Patient identified by MRN, date of birth, ID band Patient awake    Reviewed: Allergy & Precautions, H&P , NPO status , Patient's Chart, lab work & pertinent test results, reviewed documented beta blocker date and time   Airway Mallampati: II   Neck ROM: full    Dental  (+) Poor Dentition   Pulmonary neg pulmonary ROS, shortness of breath and with exertion, former smoker,    Pulmonary exam normal        Cardiovascular Exercise Tolerance: Poor hypertension, On Medications + CAD and + Past MI  negative cardio ROS Normal cardiovascular exam Rhythm:regular Rate:Normal     Neuro/Psych negative neurological ROS  negative psych ROS   GI/Hepatic negative GI ROS, Neg liver ROS, PUD,   Endo/Other  negative endocrine ROS  Renal/GU Renal diseasenegative Renal ROS  negative genitourinary   Musculoskeletal   Abdominal   Peds  Hematology negative hematology ROS (+) Blood dyscrasia, anemia ,   Anesthesia Other Findings Past Medical History: No date: Arthritis No date: Coronary artery disease     Comment:  coronary stent No date: Dyspnea No date: Hard of hearing No date: Heart murmur No date: Hypercholesteremia No date: Hypertension No date: Myocardial infarction [facility])     Comment:  2015 No date: Osteoporosis No date: Stroke [facility])     Comment:  10-12 years ago Past Surgical History: No date: CAROTID STENT 11/09/2017: CATARACT EXTRACTION W/PHACO; Right     Comment:  Procedure: CATARACT EXTRACTION PHACO AND INTRAOCULAR               LENS PLACEMENT (IOC) RIGHT;  Surgeon: Eulogio Bear,              MD;  Location: [place];  Service:               Ophthalmology;  Laterality: Right; 11/23/2017: CATARACT EXTRACTION W/PHACO; Left     Comment:  Procedure: CATARACT EXTRACTION PHACO AND INTRAOCULAR               LENS PLACEMENT ([place]) LEFT;  Surgeon: Eulogio Bear,               MD;   Location: [place];  Service:               Ophthalmology;  Laterality: Left; 02/22/2018: ESOPHAGOGASTRODUODENOSCOPY (EGD) WITH PROPOFOL; N/A     Comment:  Procedure: ESOPHAGOGASTRODUODENOSCOPY (EGD) WITH               PROPOFOL;  Surgeon: Virgel Manifold, MD;  Location:               [facility] ENDOSCOPY;  Service: Endoscopy;  Laterality: N/A; 06/08/2018: ESOPHAGOGASTRODUODENOSCOPY (EGD) WITH PROPOFOL; N/A     Comment:  Procedure: ESOPHAGOGASTRODUODENOSCOPY (EGD) WITH               PROPOFOL;  Surgeon: Virgel Manifold, MD;  Location:               [facility] ENDOSCOPY;  Service: Endoscopy;  Laterality: N/A; No date: INCONTINENCE SURGERY No date: THYROID SURGERY BMI    Body Mass Index:  23.38 kg/m     Reproductive/Obstetrics negative OB ROS                             Anesthesia Physical Anesthesia Plan  ASA: III  Anesthesia Plan: General   Post-op Pain Management:    Induction:  PONV Risk Score and Plan:   Airway Management Planned:   Additional Equipment:   Intra-op Plan:   Post-operative Plan:   Informed Consent: I have reviewed the patients History and Physical, chart, labs and discussed the procedure including the risks, benefits and alternatives for the proposed anesthesia with the patient or authorized representative who has indicated his/her understanding and acceptance.   Dental Advisory Given  Plan Discussed with: CRNA  Anesthesia Plan Comments:         Anesthesia Quick Evaluation

## 2018-10-07 LAB — SURGICAL PATHOLOGY

## 2018-10-10 LAB — CYTOLOGY - NON PAP

## 2018-11-22 IMAGING — CT CT CHEST LUNG CANCER SCREENING LOW DOSE W/O CM
2 of 5 series · 15 of 40 positions shown, 18 images · non-contrast
Comparison: Low-dose lung cancer screening chest CT 07/16/2017.

CLINICAL DATA: 67-year-old female former smoker (quit 4 years ago)
with 33 pack-year history of smoking. Lung cancer screening
examination.

EXAM:
CT CHEST WITHOUT CONTRAST LOW-DOSE FOR LUNG CANCER SCREENING
TECHNIQUE: Multidetector CT imaging of the chest was performed following the
standard protocol without IV contrast.

[Series 3: lung · axial · 0.60mm/px · z∈[-1234,-984]mm · 12 of 278 slices shown, 15 images (1 of 2)]
[im 14/278  mediastinal]
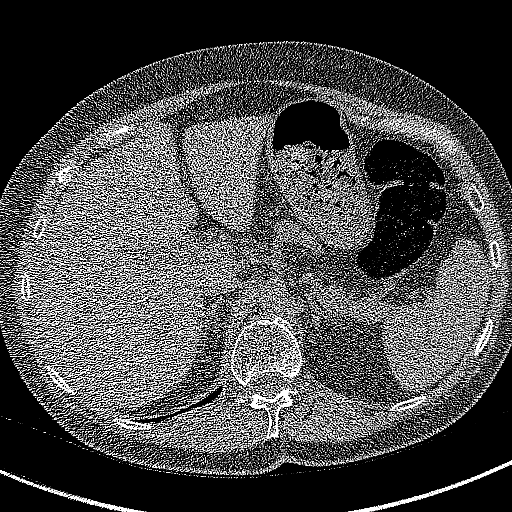
[im 14/278  lung]
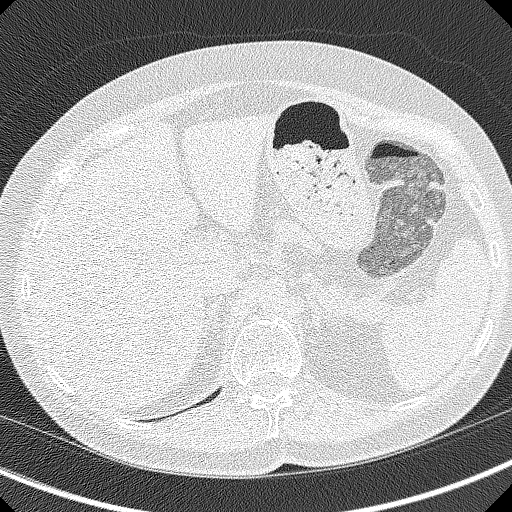
[im 42/278  lung]
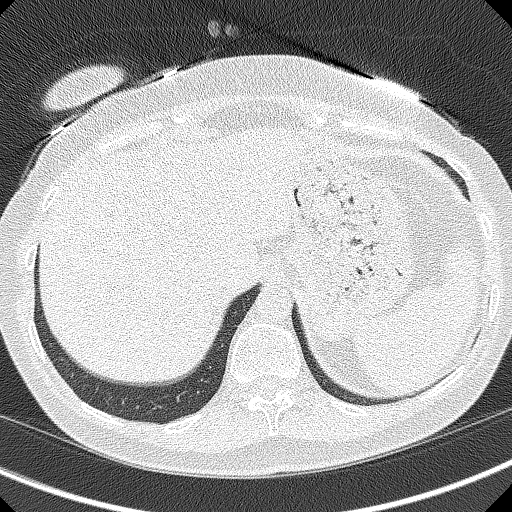
[im 56/278  lung]
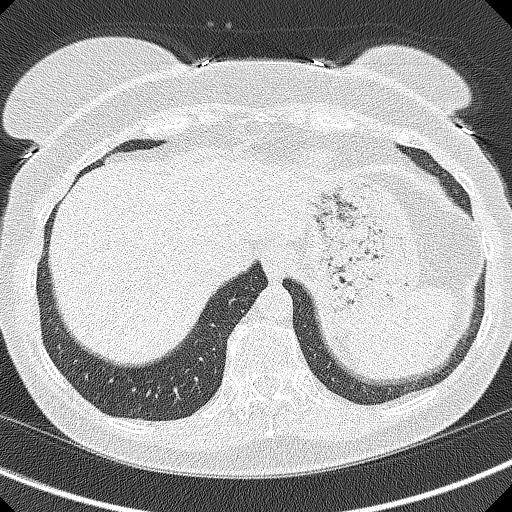
[im 84/278  lung]
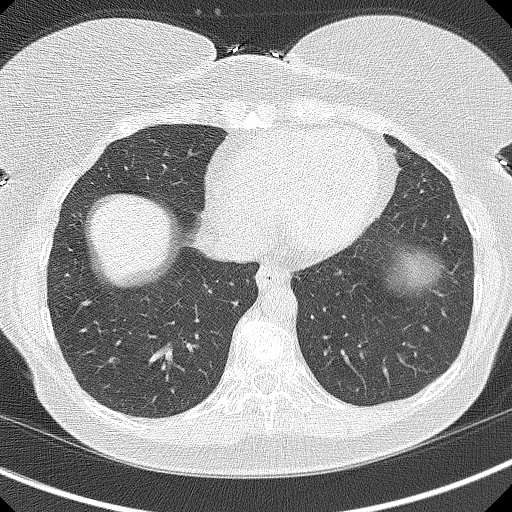
[im 111/278  mediastinal]
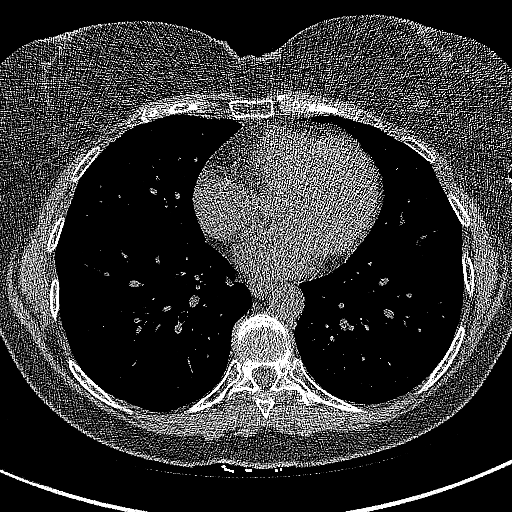
[im 111/278  lung]
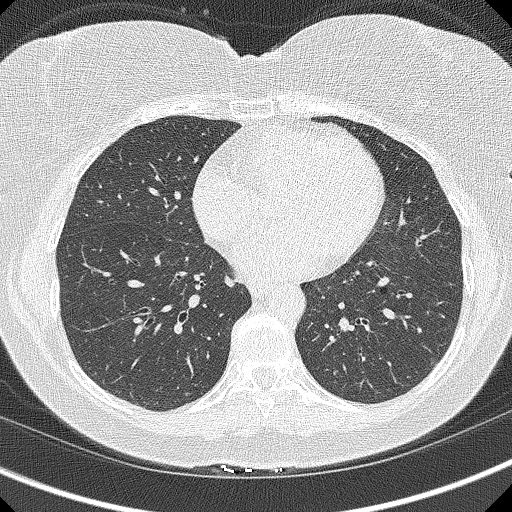
[im 125/278  lung]
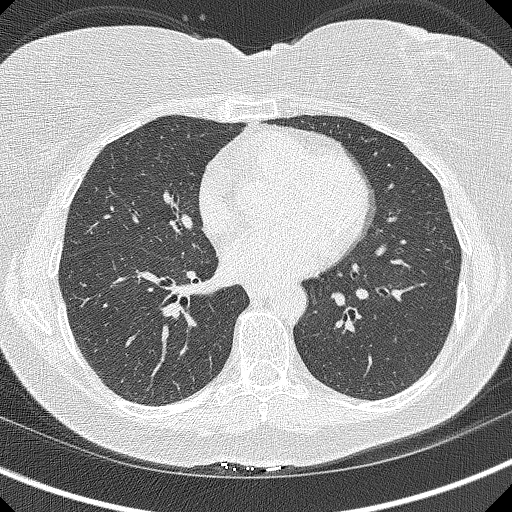
[im 153/278  lung]
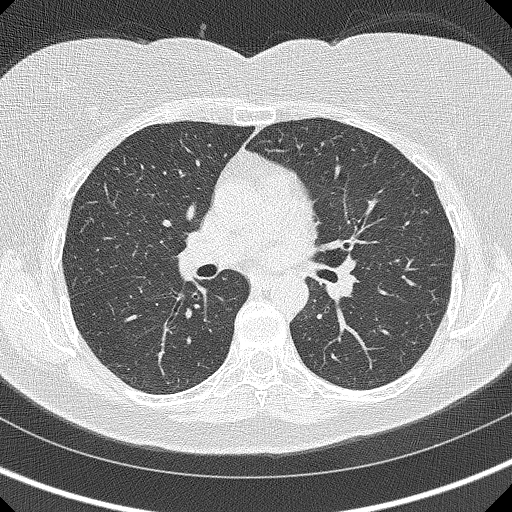
[im 167/278  lung]
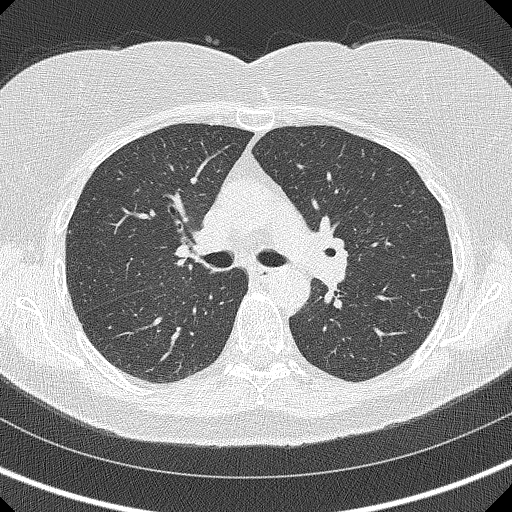
[im 194/278  mediastinal]
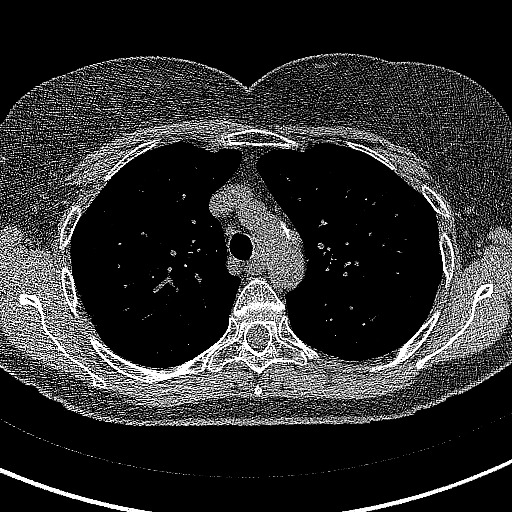
[im 194/278  lung]
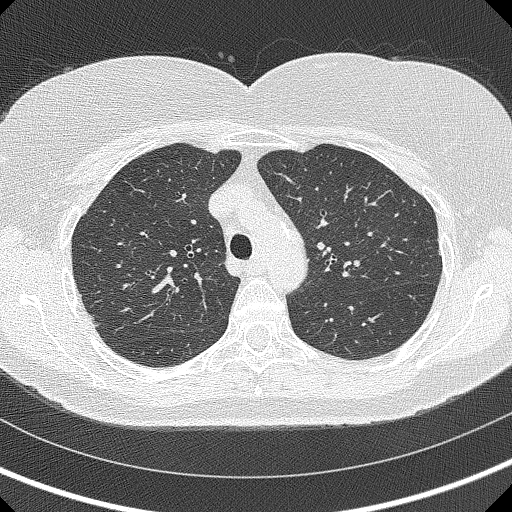
[im 222/278  lung]
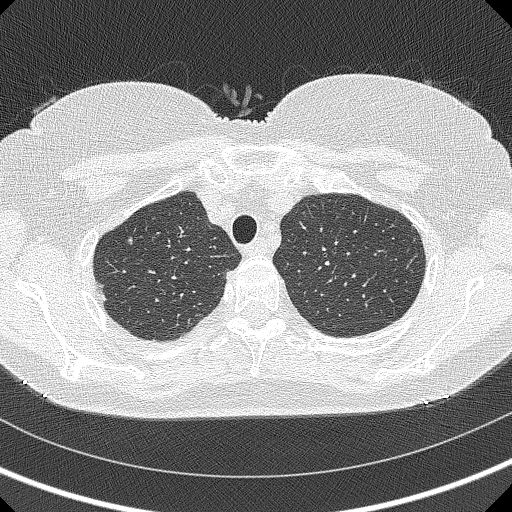
[im 236/278  lung]
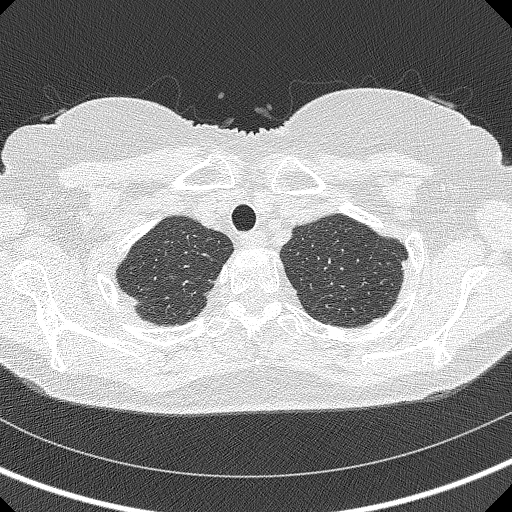
[im 264/278  lung]
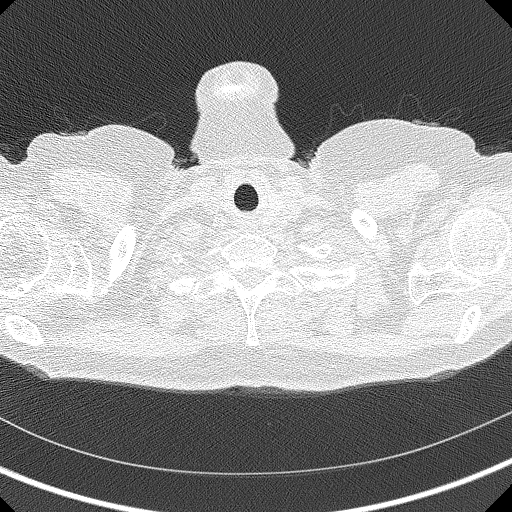

[Series 4: lung · coronal · 0.54mm/px · 3 of 303 slices shown (2 of 2)]
[im 61/303  lung]
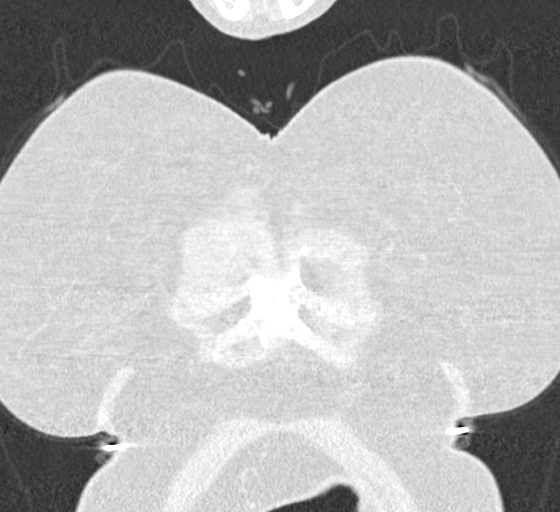
[im 121/303  lung]
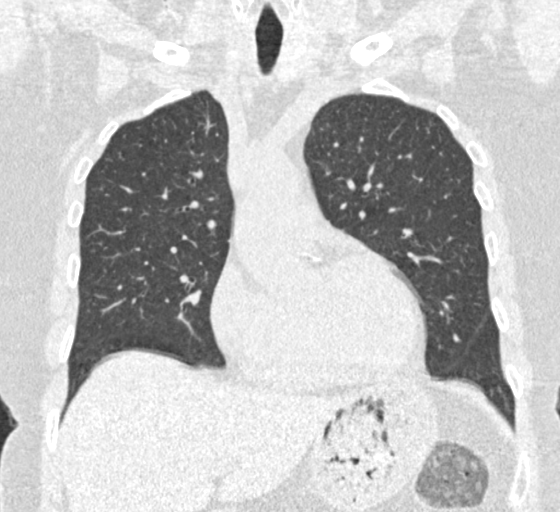
[im 182/303  lung]
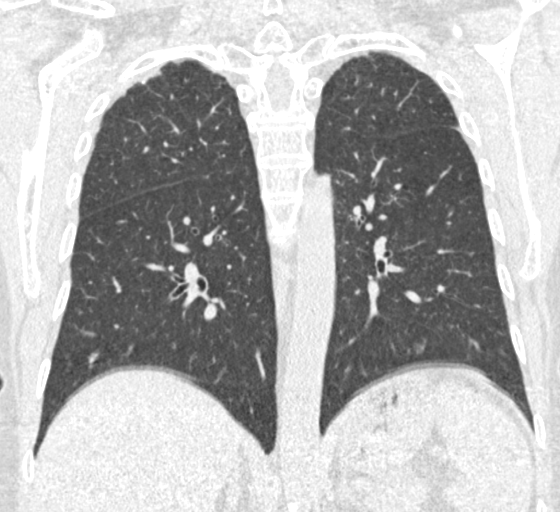

[15 of 40 positions shown; findings below may reference images not displayed]

FINDINGS: Cardiovascular: Heart size is normal. There is no significant
pericardial fluid, thickening or pericardial calcification. There is
aortic atherosclerosis, as well as atherosclerosis of the great
vessels of the mediastinum and the coronary arteries, including
calcified atherosclerotic plaque in the left main, left anterior
descending, left circumflex and right coronary arteries.

Mediastinum/Nodes: No pathologically enlarged mediastinal or hilar
lymph nodes. Please note that accurate exclusion of hilar adenopathy
is limited on noncontrast CT scans. Esophagus is unremarkable in
appearance. No axillary lymphadenopathy.

Lungs/Pleura: Multiple pulmonary nodules are noted throughout the
lungs bilaterally, similar in size, number and distribution to the
prior study, with the largest of these in the apex of the right
upper lobe medially (axial image 32 of series 3), with a volume
derived mean diameter 5.9 mm. No larger more suspicious appearing
pulmonary nodules or masses are noted. No acute consolidative
airspace disease. No pleural effusions. Mild diffuse bronchial wall
thickening with very mild centrilobular and paraseptal emphysema.

Upper Abdomen: Aortic atherosclerosis. 15 mm low-attenuation lesion
in segment 2 of the liver, incompletely characterized on today's
noncontrast CT examination, but similar to prior studies and
statistically likely a cyst.

Musculoskeletal: There are no aggressive appearing lytic or blastic
lesions noted in the visualized portions of the skeleton.
IMPRESSION: 1. Lung-RADS 2S, benign appearance or behavior. Continue annual
screening with low-dose chest CT without contrast in 12 months.
2. The "S" modifier above refers to potentially clinically
significant non lung cancer related findings. Specifically, there is
aortic atherosclerosis, in addition to left main and 3 vessel
coronary artery disease. Please note that although the presence of
coronary artery calcium documents the presence of coronary artery
disease, the severity of this disease and any potential stenosis
cannot be assessed on this non-gated CT examination. Assessment for
potential risk factor modification, dietary therapy or pharmacologic
therapy may be warranted, if clinically indicated.
3. Mild diffuse bronchial wall thickening with very mild
centrilobular and paraseptal emphysema; imaging findings suggestive
of underlying COPD.

Aortic Atherosclerosis (MCR5R-9YQ.Q) and Emphysema (MCR5R-Y1A.S).

## 2018-12-15 ENCOUNTER — Telehealth: Payer: Self-pay | Admitting: Family Medicine

## 2018-12-15 ENCOUNTER — Other Ambulatory Visit: Payer: Self-pay | Admitting: Family Medicine

## 2018-12-15 MED ORDER — ATORVASTATIN CALCIUM 40 MG PO TABS: 40.0000 mg | ORAL_TABLET | Freq: Every day | ORAL | 1 refills | Status: DC

## 2018-12-15 NOTE — Telephone Encounter (Signed)
Copied from [place] [id number]. Topic: Quick Communication - Rx Refill/Question >> Dec 15, 2018  1:17 PM Rayann Heman wrote: Medication: atorvastatin (LIPITOR) 40 MG tablet [[id number]]   Has the patient contacted their pharmacy?  Preferred Pharmacy (with phone number or street name):[id number] - [place], [place] - [address] [phone] (Phone) [phone] (Fax)  Agent: Please be advised that RX refills may take up to 3 business days. We ask that you follow-up with your pharmacy.

## 2018-12-26 NOTE — Progress Notes (Deleted)
There were no vitals taken for this visit.   Subjective:    Patient ID: Tonya Cabrera, female    DOB: 19-Jul-1951, 68 y.o.   MRN: [id number]  HPI: Tonya Cabrera is a 68 y.o. female  No chief complaint on file.  HYPERTENSION / HYPERLIPIDEMIA Satisfied with current treatment? {Blank single:19197::"yes","no"} Duration of hypertension: {Blank single:19197::"chronic","months","years"} BP monitoring frequency: {Blank single:19197::"not checking","rarely","daily","weekly","monthly","a few times a day","a few times a week","a few times a month"} BP range:  BP medication side effects: {Blank single:19197::"yes","no"} Past BP meds: {Blank UQJFHLKT:62563::"SLHT","DSKAJGOTLX","BWIOMBTDHR/CBULAGTXMI","WOEHOZYY","QMGNOIBBCW","UGQBVQXIHW/TUUE","KCMKLKJZPH (bystolic)","carvedilol","chlorthalidone","clonidine","diltiazem","exforge HCT","HCTZ","irbesartan (avapro)","labetalol","lisinopril","lisinopril-HCTZ","losartan (cozaar)","methyldopa","nifedipine","olmesartan (benicar)","olmesartan-HCTZ","quinapril","ramipril","spironalactone","tekturna","valsartan","valsartan-HCTZ","verapamil"} Duration of hyperlipidemia: {Blank single:19197::"chronic","months","years"} Cholesterol medication side effects: {Blank single:19197::"yes","no"} Cholesterol supplements: {Blank multiple:19196::"none","fish oil","niacin","red yeast rice"} Past cholesterol medications: {Blank multiple:19196::"none","atorvastain (lipitor)","lovastatin (mevacor)","pravastatin (pravachol)","rosuvastatin (crestor)","simvastatin (zocor)","vytorin","fenofibrate (tricor)","gemfibrozil","ezetimide (zetia)","niaspan","lovaza"} Medication compliance: {Blank single:19197::"excellent compliance","good compliance","fair compliance","poor compliance"} Aspirin: {Blank single:19197::"yes","no"} Recent stressors: {Blank single:19197::"yes","no"} Recurrent headaches: {Blank single:19197::"yes","no"} Visual changes: {Blank single:19197::"yes","no"}  Palpitations: {Blank single:19197::"yes","no"} Dyspnea: {Blank single:19197::"yes","no"} Chest pain: {Blank single:19197::"yes","no"} Lower extremity edema: {Blank single:19197::"yes","no"} Dizzy/lightheaded: {Blank single:19197::"yes","no"}  DEPRESSION Mood status: {Blank single:19197::"controlled","uncontrolled","better","worse","exacerbated","stable"} Satisfied with current treatment?: {Blank single:19197::"yes","no"} Symptom severity: {Blank single:19197::"mild","moderate","severe"}  Duration of current treatment : {Blank single:19197::"chronic","months","years"} Side effects: {Blank single:19197::"yes","no"} Medication compliance: {Blank single:19197::"excellent compliance","good compliance","fair compliance","poor compliance"} Psychotherapy/counseling: {Blank single:19197::"yes","no"} {Blank single:19197::"current","in the past"} Previous psychiatric medications: {Blank multiple:19196::"abilify","amitryptiline","buspar","celexa","cymbalta","depakote","effexor","lamictal","lexapro","lithium","nortryptiline","paxil","prozac","pristiq (desvenlafaxine","seroquel","wellbutrin","zoloft","zyprexa"} Depressed mood: {Blank single:19197::"yes","no"} Anxious mood: {Blank single:19197::"yes","no"} Anhedonia: {Blank single:19197::"yes","no"} Significant weight loss or gain: {Blank single:19197::"yes","no"} Insomnia: {Blank single:19197::"yes","no"} {Blank single:19197::"hard to fall asleep","hard to stay asleep"} Fatigue: {Blank single:19197::"yes","no"} Feelings of worthlessness or guilt: {Blank single:19197::"yes","no"} Impaired concentration/indecisiveness: {Blank single:19197::"yes","no"} Suicidal ideations: {Blank single:19197::"yes","no"} Hopelessness: {Blank single:19197::"yes","no"} Crying spells: {Blank single:19197::"yes","no"} Depression screen [facility] 2/9 07/08/2018 03/04/2018 06/02/2017 01/25/2017 06/08/2016  Decreased Interest 0 3 1 0 0  Down, Depressed, Hopeless 0 3 1 3  0  PHQ - 2 Score 0  6 2 3  0  Altered sleeping 3 3 3 3  -  Tired, decreased energy 1 3 0 0 -  Change in appetite 0 2 0 0 -  Feeling bad or failure about yourself  0 2 0 1 -  Trouble concentrating 3 3 1 2  -  Moving slowly or fidgety/restless 0 3 0 0 -  Suicidal thoughts 0 2 0 0 -  PHQ-9 Score 7 24 6 9  -  Difficult doing work/chores Not difficult at all Extremely dIfficult Not difficult at all - -    Relevant past medical, surgical, family and social history reviewed and updated as indicated. Interim medical history since our last visit reviewed. Allergies and medications reviewed and updated.  Review of Systems  Per HPI unless specifically indicated above     Objective:    There were no vitals taken for this visit.  Wt Readings from Last 3 Encounters:  10/06/18 132 lb (59.9 kg)  08/16/18 135 lb 3.2 oz (61.3 kg)  07/19/18 132 lb (59.9 kg)    Physical Exam  Results for orders placed or performed during the hospital encounter of 10/06/18  KOH prep  Result Value Ref Range   Specimen Description BRONCHIAL BRUSHING    Special Requests Normal    KOH Prep      BUDDING YEAST SEEN Performed at [facility], [address] [place],  [address]    Report Status 10/06/2018 FINAL   Surgical pathology  Result Value Ref Range   SURGICAL PATHOLOGY      Surgical Pathology CASE: [id number] PATIENT: Tonya Cabrera Surgical Pathology Report     SPECIMEN SUBMITTED: A. Stomach, antrum, greater curvature, cbx B. Stomach, antrum, lesser curvature;  cbx C. Stomach, angularis; cbx D. Stomach, body, greater curvature,cbx E. Stomach, body, lesser curvature;cbx F. Colon polyp, cecum; cbx  CLINICAL HISTORY: None provided  PRE-OPERATIVE DIAGNOSIS: Z12.11 screening colonoscopy; Z86.19 HX of H Pylori  POST-OPERATIVE DIAGNOSIS: Gastric erythema, colon polyp, diverticulosis     DIAGNOSIS: A.  STOMACH, ANTRUM GREATER CURVATURE; COLD BIOPSY: - ANTRAL MUCOSA WITH REACTIVE GASTRITIS AND  FOCAL INTESTINAL METAPLASIA. - NEGATIVE FOR H. PYLORI, DYSPLASIA, AND MALIGNANCY.  B.  STOMACH, ANTRUM LESSER CURVATURE; COLD BIOPSY: - ANTRAL MUCOSA WITH REACTIVE GASTRITIS WITH VERY FOCAL INTESTINAL METAPLASIA. - NEGATIVE FOR H. PYLORI, DYSPLASIA, AND MALIGNANCY.  C.  STOMACH, ANGULARIS; COLD BIOPSY: - OXYNTIC MUCOSA WITH MILD CHRON IC GASTRITIS. - NEGATIVE FOR INTESTINAL METAPLASIA, H. PYLORI, DYSPLASIA, AND MALIGNANCY.  D.  STOMACH, BODY GREATER CURVATURE; COLD BIOPSY: - OXYNTIC MUCOSA WITH MILD CHRONIC GASTRITIS. - NEGATIVE FOR INTESTINAL METAPLASIA, H. PYLORI, DYSPLASIA, AND MALIGNANCY.  E.  STOMACH, BODY LESSER CURVATURE; COLD BIOPSY: - OXYNTIC MUCOSA WITH MINIMAL CHRONIC GASTRITIS. - NEGATIVE FOR INTESTINAL METAPLASIA, H. PYLORI, DYSPLASIA, AND MALIGNANCY.  F.  COLON POLYP, CECUM; COLD BIOPSY: - TUBULAR ADENOMA. - NEGATIVE FOR HIGH-GRADE DYSPLASIA AND MALIGNANCY.   GROSS DESCRIPTION: A. Labeled: Greater curvature antrum of stomach C BX Received: Formalin Tissue fragment(s): Multiple Size: Aggregate, 0.7 x 0.5 x 0.1 cm Description: Tan soft tissue fragments Entirely submitted in 1 cassette.  B. Labeled: Lesser curvature antrum of stomach C BX Received: Formalin Tissue fragment(s): Multiple Size: Aggregate, 0.6 x 0.4 x 0.1 cm Description: Tan soft tissue fragments Entirely submi tted in 1 cassette.  C. Labeled: Angularis C BX Received: Formalin Tissue fragment(s): 2 Size: 0.3-0.4 cm Description: Tan soft tissue fragments Entirely submitted in 1 cassette.  D. Labeled: Greater curvature body of stomach C BX Received: Formalin Tissue fragment(s): 2 Size: 0.2-0.3 cm Description: Tan soft tissue fragments Entirely submitted in 1 cassette.  E. Labeled: Lesser curvature body of stomach C BX Received: Formalin Tissue fragment(s): 2 Size: 0.3-0.4 cm Description: Tan soft tissue fragments Entirely submitted in 1 cassette.  F. Labeled: Cecal polyp C BX  Received: Formalin Tissue fragment(s): 1 Size: 0.2 cm Description: Tan soft tissue fragment Entirely submitted in 1 cassette.   Final Diagnosis performed by Quay Burow, MD.   Electronically signed 10/07/2018 9:56:27AM The electronic signature indicates that the named Attending Pathologist has evaluated the specimen  Technical component performed at [facility], [address], [place],  [place] [address] Lab: [phone] Dir: Rush Farmer, MD, MMM  Professional component performed at [facility], [facility], [place], [place], [place] [address] Lab: [phone] Dir: Dellia Nims. Reuel Derby, MD   Cytology - Non PAP;  Result Value Ref Range   CYTOLOGY - NON GYN      Cytology - Non PAP CASE: [id number] PATIENT: Tonya Cabrera Non-Gyn Cytology Report     SPECIMEN SUBMITTED: A. Esophageal; brushing  CLINICAL HISTORY: None provided  PRE-OPERATIVE DIAGNOSIS: Z12.11 screening colonoscopy; Z86.19 HX of H Pylori  POST-OPERATIVE DIAGNOSIS: White nummular lesions were noted in the mid esophagus.     DIAGNOSIS: A.  ESOPHAGEAL BRUSHINGS: - NEGATIVE FOR MALIGNANCY. - BENIGN SQUAMES WITH FOCAL BACTERIAL DEBRIS CONSISTENT WITH ORAL CONTAMINATION. - NEGATIVE FOR YEAST / FUNGUS; GMS STAIN EXAMINED.  Comment: A concurrent specimen was submitted to the clinical laboratory for KOH prep and budding yeast were identified.  Stain control worked appropriately.  GROSS DESCRIPTION: A. Labeled: Esophageal brushings Received: Fresh Volume: 2 mL Description: Clear fluid in a plastic, white screw top container Submitted for ThinPrep  and cell block   Final Diagnosis performed by Quay Burow, MD.   Electronically signed 10/10/2018 1 0:24:30AM The electronic signature indicates that the named Attending Pathologist has evaluated the specimen  Technical component performed at [facility], [address] [place], [place] [address] Lab: [phone] Dir: Rush Farmer, MD, MMM   Professional component performed at [facility], [facility], [place], [place], [place] [address] Lab: [phone] Dir: Dellia Nims. Rubinas, MD       Assessment & Plan:   Problem List Items Addressed This Visit      Cardiovascular and Mediastinum   Aortic atherosclerosis ([place])   RESOLVED: HTN (hypertension) - Primary     Genitourinary   Benign hypertensive renal disease     Other   Hyperlipidemia   Depression, recurrent ([place])       Follow up plan: No follow-ups on file.

## 2018-12-27 ENCOUNTER — Telehealth: Payer: Self-pay

## 2018-12-27 NOTE — Telephone Encounter (Signed)
Patient scheduled for an AWV on 12/29/2018 with [facility], Due to Covid-19 pandemic this is unable to be done in office, called patient to see if she was able to do this virtually. Patient able to access Skype. Patient informed this nurse would call her at her at 10:00am on 12/29/2018 to walk through skype virtual visit. Patient confirmed

## 2018-12-29 ENCOUNTER — Telehealth: Payer: Self-pay

## 2018-12-29 ENCOUNTER — Ambulatory Visit: Payer: Medicare Other | Admitting: Family Medicine

## 2018-12-29 ENCOUNTER — Ambulatory Visit: Payer: Medicare Other

## 2018-12-29 NOTE — Telephone Encounter (Signed)
Patient scheduled today for AWV via skype due to covid-19 pandemic. Attempted this visit with patient. Unable to complete visit as there were connection issues. Patient agrees to reschedule for an office visit when office reopens for wellness visits.   Message sent to Allison to confirm appt on 4/7 is virtual or telephonic as patient was unable to get on skype.

## 2019-01-02 ENCOUNTER — Telehealth: Payer: Self-pay

## 2019-01-02 NOTE — Telephone Encounter (Signed)
Ms.Howser called back stating [facility] was unable to fill rx for blood pressure cuff and is requesting it be sent to [facility]. Sent to [facility] and called patient to inform her it was sent and to call the pharmacy to confirm if she can get it before her appt with Dr.Johnson on 4/7. Pt to call back

## 2019-01-03 ENCOUNTER — Ambulatory Visit (INDEPENDENT_AMBULATORY_CARE_PROVIDER_SITE_OTHER): Payer: Medicare Other | Admitting: Family Medicine

## 2019-01-03 ENCOUNTER — Encounter: Payer: Self-pay | Admitting: Family Medicine

## 2019-01-03 ENCOUNTER — Other Ambulatory Visit: Payer: Self-pay

## 2019-01-03 ENCOUNTER — Encounter: Payer: Self-pay | Admitting: Gastroenterology

## 2019-01-03 ENCOUNTER — Other Ambulatory Visit: Payer: Self-pay | Admitting: Family Medicine

## 2019-01-03 DIAGNOSIS — F339 Major depressive disorder, recurrent, unspecified: Secondary | ICD-10-CM

## 2019-01-03 DIAGNOSIS — E782 Mixed hyperlipidemia: Secondary | ICD-10-CM

## 2019-01-03 DIAGNOSIS — I129 Hypertensive chronic kidney disease with stage 1 through stage 4 chronic kidney disease, or unspecified chronic kidney disease: Secondary | ICD-10-CM | POA: Diagnosis not present

## 2019-01-03 DIAGNOSIS — R197 Diarrhea, unspecified: Secondary | ICD-10-CM

## 2019-01-03 NOTE — Assessment & Plan Note (Signed)
Under good control on current regimen. Continue current regimen. Continue to monitor. Call with any concerns. Refills given. Will get her in for labs ASAP.

## 2019-01-03 NOTE — Progress Notes (Signed)
There were no vitals taken for this visit.   Subjective:    Patient ID: Tonya Cabrera, female    DOB: Feb 07, 1951, 68 y.o.   MRN: [id number]  HPI: Tonya Cabrera is a 68 y.o. female  Chief Complaint  Patient presents with  . Hypertension  . Hyperlipidemia  . Depression   Has continued with diarrhea. Continues to have bowel urgency, not getting to the bathroom, taking anti-diarrheal every time she leaves the house. Has not checked back in with her GI doctor.   HYPERTENSION / HYPERLIPIDEMIA Satisfied with current treatment? yes Duration of hypertension: chronic BP monitoring frequency: not checking BP medication side effects: no Past BP meds: lisinopril and metoprolol Duration of hyperlipidemia: chronic Cholesterol medication side effects: no Cholesterol supplements: none Past cholesterol medications: atorvastatin Medication compliance: excellent compliance Aspirin: yes Recent stressors: no Recurrent headaches: no Visual changes: no Palpitations: no Dyspnea: yes with exertion Chest pain: no Lower extremity edema: no Dizzy/lightheaded: no  DEPRESSION Mood status: stable Satisfied with current treatment?: yes Symptom severity: mild  Duration of current treatment : not on anything Psychotherapy/counseling: no  Depressed mood: no Anxious mood: no Anhedonia: no Significant weight loss or gain: no Insomnia: no  Fatigue: yes Feelings of worthlessness or guilt: no Impaired concentration/indecisiveness: no Suicidal ideations: no Hopelessness: no Crying spells: no Depression screen [facility] 2/9 01/03/2019 07/08/2018 03/04/2018 06/02/2017 01/25/2017  Decreased Interest 0 0 3 1 0  Down, Depressed, Hopeless 0 0 3 1 3   PHQ - 2 Score 0 0 6 2 3   Altered sleeping 3 3 3 3 3   Tired, decreased energy 0 1 3 0 0  Change in appetite 0 0 2 0 0  Feeling bad or failure about yourself  0 0 2 0 1  Trouble concentrating 0 3 3 1 2   Moving slowly or fidgety/restless 1 0 3 0 0  Suicidal  thoughts 0 0 2 0 0  PHQ-9 Score 4 7 24 6 9   Difficult doing work/chores Not difficult at all Not difficult at all Extremely dIfficult Not difficult at all -    Relevant past medical, surgical, family and social history reviewed and updated as indicated. Interim medical history since our last visit reviewed. Allergies and medications reviewed and updated.  Review of Systems  Constitutional: Negative.   HENT: Negative.   Respiratory: Negative.   Cardiovascular: Negative.   Gastrointestinal: Positive for abdominal pain and diarrhea. Negative for abdominal distention, anal bleeding, blood in stool, constipation, nausea, rectal pain and vomiting.  Neurological: Negative.   Psychiatric/Behavioral: Negative.     Per HPI unless specifically indicated above     Objective:    There were no vitals taken for this visit.  Wt Readings from Last 3 Encounters:  10/06/18 132 lb (59.9 kg)  08/16/18 135 lb 3.2 oz (61.3 kg)  07/19/18 132 lb (59.9 kg)    Physical Exam Vitals signs and nursing note reviewed.  Pulmonary:     Effort: Pulmonary effort is normal. No respiratory distress.     Comments: Speaking in full sentences Neurological:     Mental Status: She is alert.  Psychiatric:        Mood and Affect: Mood normal.        Behavior: Behavior normal.        Thought Content: Thought content normal.        Judgment: Judgment normal.     Results for orders placed or performed during the hospital encounter of 10/06/18  KOH prep  Result Value Ref Range   Specimen Description BRONCHIAL BRUSHING    Special Requests Normal    KOH Prep      BUDDING YEAST SEEN Performed at [facility], [address] [place], [place] [address]    Report Status 10/06/2018 FINAL   Surgical pathology  Result Value Ref Range   SURGICAL PATHOLOGY      Surgical Pathology CASE: [id number] PATIENT: [name] Surgical Pathology Report     SPECIMEN SUBMITTED: A. Stomach, antrum, greater  curvature, cbx B. Stomach, antrum, lesser curvature; cbx C. Stomach, angularis; cbx D. Stomach, body, greater curvature,cbx E. Stomach, body, lesser curvature;cbx F. Colon polyp, cecum; cbx  CLINICAL HISTORY: None provided  PRE-OPERATIVE DIAGNOSIS: Z12.11 screening colonoscopy; Z86.19 HX of H Pylori  POST-OPERATIVE DIAGNOSIS: Gastric erythema, colon polyp, diverticulosis     DIAGNOSIS: A.  STOMACH, ANTRUM GREATER CURVATURE; COLD BIOPSY: - ANTRAL MUCOSA WITH REACTIVE GASTRITIS AND FOCAL INTESTINAL METAPLASIA. - NEGATIVE FOR H. PYLORI, DYSPLASIA, AND MALIGNANCY.  B.  STOMACH, ANTRUM LESSER CURVATURE; COLD BIOPSY: - ANTRAL MUCOSA WITH REACTIVE GASTRITIS WITH VERY FOCAL INTESTINAL METAPLASIA. - NEGATIVE FOR H. PYLORI, DYSPLASIA, AND MALIGNANCY.  C.  STOMACH, ANGULARIS; COLD BIOPSY: - OXYNTIC MUCOSA WITH MILD CHRON IC GASTRITIS. - NEGATIVE FOR INTESTINAL METAPLASIA, H. PYLORI, DYSPLASIA, AND MALIGNANCY.  D.  STOMACH, BODY GREATER CURVATURE; COLD BIOPSY: - OXYNTIC MUCOSA WITH MILD CHRONIC GASTRITIS. - NEGATIVE FOR INTESTINAL METAPLASIA, H. PYLORI, DYSPLASIA, AND MALIGNANCY.  E.  STOMACH, BODY LESSER CURVATURE; COLD BIOPSY: - OXYNTIC MUCOSA WITH MINIMAL CHRONIC GASTRITIS. - NEGATIVE FOR INTESTINAL METAPLASIA, H. PYLORI, DYSPLASIA, AND MALIGNANCY.  F.  COLON POLYP, CECUM; COLD BIOPSY: - TUBULAR ADENOMA. - NEGATIVE FOR HIGH-GRADE DYSPLASIA AND MALIGNANCY.   GROSS DESCRIPTION: A. Labeled: Greater curvature antrum of stomach C BX Received: Formalin Tissue fragment(s): Multiple Size: Aggregate, 0.7 x 0.5 x 0.1 cm Description: Tan soft tissue fragments Entirely submitted in 1 cassette.  B. Labeled: Lesser curvature antrum of stomach C BX Received: Formalin Tissue fragment(s): Multiple Size: Aggregate, 0.6 x 0.4 x 0.1 cm Description: Tan soft tissue fragments Entirely submi tted in 1 cassette.  C. Labeled: Angularis C BX Received: Formalin Tissue fragment(s): 2  Size: 0.3-0.4 cm Description: Tan soft tissue fragments Entirely submitted in 1 cassette.  D. Labeled: Greater curvature body of stomach C BX Received: Formalin Tissue fragment(s): 2 Size: 0.2-0.3 cm Description: Tan soft tissue fragments Entirely submitted in 1 cassette.  E. Labeled: Lesser curvature body of stomach C BX Received: Formalin Tissue fragment(s): 2 Size: 0.3-0.4 cm Description: Tan soft tissue fragments Entirely submitted in 1 cassette.  F. Labeled: Cecal polyp C BX Received: Formalin Tissue fragment(s): 1 Size: 0.2 cm Description: Tan soft tissue fragment Entirely submitted in 1 cassette.   Final Diagnosis performed by Quay Burow, MD.   Electronically signed 10/07/2018 9:56:27AM The electronic signature indicates that the named Attending Pathologist has evaluated the specimen  Technical component performed at [facility], [address] [place],  [place] [address] Lab: [phone] Dir: Rush Farmer, MD, MMM  Professional component performed at [facility], [facility], [place], [place], [place] [address] Lab: [phone] Dir: Dellia Nims. Reuel Derby, MD   Cytology - Non PAP;  Result Value Ref Range   CYTOLOGY - NON GYN      Cytology - Non PAP CASE: [id number] PATIENT: [name] Non-Gyn Cytology Report     SPECIMEN SUBMITTED: A. Esophageal; brushing  CLINICAL HISTORY: None provided  PRE-OPERATIVE DIAGNOSIS: Z12.11 screening colonoscopy; Z86.19 HX of H Pylori  POST-OPERATIVE DIAGNOSIS: White nummular lesions were noted in the mid esophagus.     DIAGNOSIS: A.  ESOPHAGEAL BRUSHINGS: - NEGATIVE FOR MALIGNANCY. - BENIGN SQUAMES WITH FOCAL BACTERIAL DEBRIS CONSISTENT WITH ORAL CONTAMINATION. - NEGATIVE FOR YEAST / FUNGUS; GMS STAIN EXAMINED.  Comment: A concurrent specimen was submitted to the clinical laboratory for KOH prep and budding yeast were identified.  Stain control worked appropriately.  GROSS  DESCRIPTION: A. Labeled: Esophageal brushings Received: Fresh Volume: 2 mL Description: Clear fluid in a plastic, white screw top container Submitted for ThinPrep and cell block   Final Diagnosis performed by Quay Burow, MD.   Electronically signed 10/10/2018 1 0:24:30AM The electronic signature indicates that the named Attending Pathologist has evaluated the specimen  Technical component performed at [facility], [address] [place], [place] [address] Lab: [phone] Dir: Rush Farmer, MD, MMM  Professional component performed at [facility], [facility], [place], [place], [place] [address] Lab: [phone] Dir: Dellia Nims. Reuel Derby, MD       Assessment & Plan:   Problem List Items Addressed This Visit      Genitourinary   Benign hypertensive renal disease    Not having any symptoms. Will get her in for vitals and labs. Call with any concerns. Refills given.       Relevant Orders   CBC with Differential/Platelet   Comprehensive metabolic panel   Microalbumin, Urine Waived     Other   Hyperlipidemia    Under good control on current regimen. Continue current regimen. Continue to monitor. Call with any concerns. Refills given. Will get her in for labs ASAP.       Relevant Orders   CBC with Differential/Platelet   Comprehensive metabolic panel   Lipid Panel w/o Chol/HDL Ratio   Depression, recurrent (HCC)    No symptoms without medication. Call with any concerns.       Relevant Orders   CBC with Differential/Platelet    Other Visit Diagnoses    Diarrhea, unspecified type    -  Primary   No better. Will get her back in with Dr. Beverly Gust ASAP. Call with any concerns.    Relevant Orders   CBC with Differential/Platelet       Follow up plan: Return in about 6 months (around 07/05/2019) for Physical.    . This visit was completed via telephone due to the restrictions of the COVID-19 pandemic. All issues as above were discussed and addressed  but no physical exam was performed. If it was felt that the patient should be evaluated in the office, they were directed there. The patient verbally consented to this visit. Patient was unable to complete an audio/visual visit due to Lack of equipment. Due to the catastrophic nature of the COVID-19 pandemic, this visit was done through audio contact only. . Location of the patient: home . Location of the provider: work . Those involved with this call:  . Provider: Park Liter, DO . CMA: Tiffany Reel, CMA . Front Desk/Registration: Linard Millers  . Time spent on call: 25 minutes on the phone discussing health concerns

## 2019-01-03 NOTE — Assessment & Plan Note (Signed)
Not having any symptoms. Will get her in for vitals and labs. Call with any concerns. Refills given.

## 2019-01-03 NOTE — Assessment & Plan Note (Signed)
No symptoms without medication. Call with any concerns.

## 2019-01-04 ENCOUNTER — Ambulatory Visit (INDEPENDENT_AMBULATORY_CARE_PROVIDER_SITE_OTHER): Payer: Medicare Other | Admitting: Gastroenterology

## 2019-01-04 DIAGNOSIS — K529 Noninfective gastroenteritis and colitis, unspecified: Secondary | ICD-10-CM

## 2019-01-04 NOTE — Progress Notes (Signed)
Tonya Antigua, MD [address]  [place]  [place], [place] [address]  Main: [phone]  Fax: [phone]   Primary Care Physician: Valerie Roys, DO  Virtual Visit via Telephone Note  I connected with patient on 01/04/19 at 10:00 AM EDT by telephone and verified that I am speaking with the correct person using two identifiers.   I discussed the limitations, risks, security and privacy concerns of performing an evaluation and management service by telephone and the availability of in person appointments. I also discussed with the patient that there may be a patient responsible charge related to this service. The patient expressed understanding and agreed to proceed.  Location of Patient: Home Location of Provider: Home Persons involved: Patient and provider only   History of Present Illness: CC: Diarrhea  HPI: Tonya Cabrera is a 68 y.o. female with chronic diarrhea.  Patient states her diarrhea has recently worsened.  She has anywhere from 2-4 loose bowel movements a day.  She attributes this to drinking more sodas and eating more candy lately as she has quit smoking and has "switched one addiction for another".  She has had recent EGD and colonoscopy, see previous notes.  The patient denies abdominal or flank pain, anorexia, nausea or vomiting, dysphagia, black or bloody stools or weight loss.   Current Outpatient Medications  Medication Sig Dispense Refill  . alendronate (FOSAMAX) 70 MG tablet TAKE ONE TABLET BY MOUTH EVERY 7 DAYS ON EMPTY STOMACH WITH FULL GLASS OF WATER 12 tablet 3  . aspirin 81 MG EC tablet TAKE 1 TABLET (81 MG TOTAL) BY MOUTH DAILY. 90 tablet 4  . atorvastatin (LIPITOR) 40 MG tablet Take 1 tablet (40 mg total) by mouth daily. 90 tablet 1  . lisinopril (PRINIVIL,ZESTRIL) 10 MG tablet TAKE 1 TABLET BY MOUTH EVERY DAY 90 tablet 0  . metoprolol succinate (TOPROL-XL) 25 MG 24 hr tablet TAKE 1 TABLET BY MOUTH TWICE A DAY 180 tablet 0   No  current facility-administered medications for this visit.     Allergies as of 01/04/2019  . (No Known Allergies)    Review of Systems:    All systems reviewed and negative except where noted in HPI.   Observations/Objective:  Labs: CMP     Component Value Date/Time   NA 143 07/08/2018 1138   NA 144 05/16/2014 0020   K 5.4 (H) 07/08/2018 1138   K 3.9 05/16/2014 0020   CL 104 07/08/2018 1138   CL 109 (H) 05/16/2014 0020   CO2 23 07/08/2018 1138   CO2 23 05/16/2014 0020   GLUCOSE 94 07/08/2018 1138   GLUCOSE 91 02/20/2018 0531   GLUCOSE 121 (H) 05/16/2014 0020   BUN 21 07/08/2018 1138   BUN 9 05/16/2014 0020   CREATININE 0.93 07/08/2018 1138   CREATININE 0.90 05/16/2014 0020   CALCIUM 10.0 07/08/2018 1138   CALCIUM 8.2 (L) 05/16/2014 0020   PROT 6.8 07/08/2018 1138   PROT 7.5 05/15/2014 0842   ALBUMIN 4.6 07/08/2018 1138   ALBUMIN 4.1 05/15/2014 0842   AST 15 07/08/2018 1138   AST 89 (H) 05/15/2014 0842   ALT 19 07/08/2018 1138   ALT 22 05/15/2014 0842   ALKPHOS 87 07/08/2018 1138   ALKPHOS 108 05/15/2014 0842   BILITOT 2.0 (H) 07/08/2018 1138   BILITOT 1.0 05/15/2014 0842   GFRNONAA 64 07/08/2018 1138   GFRNONAA >60 05/16/2014 0020   GFRAA 74 07/08/2018 1138   GFRAA >60 05/16/2014 0020  Lab Results  Component Value Date   WBC 7.4 07/08/2018   HGB 13.3 07/08/2018   HCT 36.7 07/08/2018   MCV 86 07/08/2018   PLT 231 07/08/2018    Imaging Studies: No results found.  Assessment and Plan:   Tonya Cabrera is a 68 y.o. y/o female with chronic diarrhea and recent exacerbation  Assessment and Plan: We discussed that her increased intake of sodas, and sugar such as her candy intake, is a likely cause of her worsening diarrhea.  We encouraged her to abstain from such use and she verbalized understanding.  The Metamucil has not helped with her diarrhea and she would like to start something else for this.  She has been using Imodium as needed only, last use was  2 to 3 days ago.  I have asked her to take 4 mg of Imodium today, and then repeat only x1 today after 2 hours of the first dose if loose bowel movements recur.  She is to then start taking Imodium 2 mg once daily only starting tomorrow and do this for 5 to 7 days and then go back to as needed dosing once her diarrhea gets better, and as she stops using the sugar drinks as above.  If her symptoms do not improve she is to notify us and she verbalized understanding.  Follow Up Instructions: Repeat telemedicine visit in 4 to 6 weeks to reassess symptoms   I discussed the assessment and treatment plan with the patient. The patient was provided an opportunity to ask questions and all were answered. The patient agreed with the plan and demonstrated an understanding of the instructions.   The patient was advised to call back or seek an in-person evaluation if the symptoms worsen or if the condition fails to improve as anticipated.  I provided 15 minutes of non-face-to-face time during this encounter.   Virgel Manifold, MD  Speech recognition software was used to dictate this note.

## 2019-01-06 ENCOUNTER — Telehealth: Payer: Self-pay | Admitting: Family Medicine

## 2019-01-06 NOTE — Telephone Encounter (Signed)
Just a question patient is coming in on Tuesday for her labs but you also put to schedule physical for 6 months, she has CPE scheduled for 6/24 does this need to be rescheduled for later date?  Thank you

## 2019-01-10 ENCOUNTER — Other Ambulatory Visit: Payer: Medicare Other

## 2019-01-31 ENCOUNTER — Ambulatory Visit (INDEPENDENT_AMBULATORY_CARE_PROVIDER_SITE_OTHER): Payer: Medicare Other | Admitting: Gastroenterology

## 2019-01-31 ENCOUNTER — Encounter: Payer: Self-pay | Admitting: Gastroenterology

## 2019-01-31 ENCOUNTER — Other Ambulatory Visit: Payer: Self-pay

## 2019-01-31 DIAGNOSIS — K3189 Other diseases of stomach and duodenum: Secondary | ICD-10-CM | POA: Diagnosis not present

## 2019-01-31 DIAGNOSIS — K529 Noninfective gastroenteritis and colitis, unspecified: Secondary | ICD-10-CM | POA: Diagnosis not present

## 2019-01-31 DIAGNOSIS — K31A0 Gastric intestinal metaplasia, unspecified: Secondary | ICD-10-CM

## 2019-01-31 NOTE — Progress Notes (Signed)
Tonya Antigua, MD [address]  [place]  [place], [place] [address]  Main: [phone]  Fax: [phone]   Primary Care Physician: Valerie Roys, DO  Virtual Visit via Telephone Note  I connected with patient on 01/31/19 at  9:30 AM EDT by telephone and verified that I am speaking with the correct person using two identifiers.   I discussed the limitations, risks, security and privacy concerns of performing an evaluation and management service by telephone and the availability of in person appointments. I also discussed with the patient that there may be a patient responsible charge related to this service. The patient expressed understanding and agreed to proceed.  Location of Patient: Home Location of Provider: Home Persons involved: Patient and provider only   History of Present Illness: CC: Chronic diarrhea  HPI: Tonya Cabrera is a 68 y.o. female with history of chronic diarrhea.  Patient has been taking Imodium once daily and states her bowel movements are much better.  Was previously taking Metamucil which did not help.  For the most part will have 1-2 bowel movements a day.  However, has occasional symptoms where she will have 3-5 loose bowel movements on some days but these are only occasional about once a month.  No blood in stool.  No weight loss.  No nausea or vomiting.  Good appetite.  She continues to drink sodas, eat candy and other sugar intake even though she was advised to abstain from this as this could be leading to her diarrhea.  She states she is trying to cut down.  Previous history: Colonoscopy January 2020 with 1, 5 mm cecal polyp removed and showed tubular adenoma.  Repeat recommended in 5 years.  EGD in January 2020 with white lesions in the esophagus positive KOH prep, treated with fluconazole.  Gastric erythema with biopsies showing focal intestinal metaplasia, but no dysplasia.  These were gastric mapping biopsies.  No H. pylori was  seen on the biopsies.  Initial EGD May 2019 for anemia and melena 1 nonbleeding cratered gastric ulcer with black pigmented spot in the gastric antrum.  7 mm in largest dimension.  Treated with APC.  Duodenal erythema also reported.  Patient placed on PPI twice daily  Repeat EGD, September 2019 Gastric antrum showed healed ulcer site with healthy scar tissue.  Biopsy showed H. pylori on IHC stain.  Focal intestinal metaplasia was reported.  Patient was treated with triple therapy   Current Outpatient Medications  Medication Sig Dispense Refill  . alendronate (FOSAMAX) 70 MG tablet TAKE ONE TABLET BY MOUTH EVERY 7 DAYS ON EMPTY STOMACH WITH FULL GLASS OF WATER 12 tablet 3  . aspirin 81 MG EC tablet TAKE 1 TABLET (81 MG TOTAL) BY MOUTH DAILY. 90 tablet 4  . atorvastatin (LIPITOR) 40 MG tablet Take 1 tablet (40 mg total) by mouth daily. 90 tablet 1  . lisinopril (PRINIVIL,ZESTRIL) 10 MG tablet TAKE 1 TABLET BY MOUTH EVERY DAY 90 tablet 0  . metoprolol succinate (TOPROL-XL) 25 MG 24 hr tablet TAKE 1 TABLET BY MOUTH TWICE A DAY 180 tablet 0   No current facility-administered medications for this visit.     Allergies as of 01/31/2019  . (No Known Allergies)    Review of Systems:    All systems reviewed and negative except where noted in HPI.   Observations/Objective:  Labs: CMP     Component Value Date/Time   NA 143 07/08/2018 1138   NA 144 05/16/2014 0020  K 5.4 (H) 07/08/2018 1138   K 3.9 05/16/2014 0020   CL 104 07/08/2018 1138   CL 109 (H) 05/16/2014 0020   CO2 23 07/08/2018 1138   CO2 23 05/16/2014 0020   GLUCOSE 94 07/08/2018 1138   GLUCOSE 91 02/20/2018 0531   GLUCOSE 121 (H) 05/16/2014 0020   BUN 21 07/08/2018 1138   BUN 9 05/16/2014 0020   CREATININE 0.93 07/08/2018 1138   CREATININE 0.90 05/16/2014 0020   CALCIUM 10.0 07/08/2018 1138   CALCIUM 8.2 (L) 05/16/2014 0020   PROT 6.8 07/08/2018 1138   PROT 7.5 05/15/2014 0842   ALBUMIN 4.6 07/08/2018 1138    ALBUMIN 4.1 05/15/2014 0842   AST 15 07/08/2018 1138   AST 89 (H) 05/15/2014 0842   ALT 19 07/08/2018 1138   ALT 22 05/15/2014 0842   ALKPHOS 87 07/08/2018 1138   ALKPHOS 108 05/15/2014 0842   BILITOT 2.0 (H) 07/08/2018 1138   BILITOT 1.0 05/15/2014 0842   GFRNONAA 64 07/08/2018 1138   GFRNONAA >60 05/16/2014 0020   GFRAA 74 07/08/2018 1138   GFRAA >60 05/16/2014 0020   Lab Results  Component Value Date   WBC 7.4 07/08/2018   HGB 13.3 07/08/2018   HCT 36.7 07/08/2018   MCV 86 07/08/2018   PLT 231 07/08/2018    Imaging Studies: No results found.  Assessment and Plan:   [name] is a 68 y.o. y/o female with chronic diarrhea, much improved with Imodium once daily  Assessment and Plan: Chronic diarrhea likely due to her daily soda, and candy intake Patient advised again to cut down on these and she verbalized understanding  Patient advised to use Imodium sparingly, and use it in conjunction with Metamucil to see if it helps bulk her stool and decrease her need for Imodium and she verbalized understanding.  No constipation at this time  Repeat colonoscopy in 5 years from January 2020  Repeat EGD in 3 years from January 2020 for surveillance of intestinal metaplasia on gastric biopsies  Status post H pylori eradication as evidenced by repeat biopsies in January 2020  Follow Up Instructions: Follow-up in 3 months to reassess diarrhea at that time   I discussed the assessment and treatment plan with the patient. The patient was provided an opportunity to ask questions and all were answered. The patient agreed with the plan and demonstrated an understanding of the instructions.   The patient was advised to call back or seek an in-person evaluation if the symptoms worsen or if the condition fails to improve as anticipated.  I provided 15 minutes of non-face-to-face time during this encounter.   Virgel Manifold, MD  Speech recognition software was used to  dictate this note.

## 2019-02-23 ENCOUNTER — Ambulatory Visit: Payer: Medicare Other | Admitting: Gastroenterology

## 2019-02-24 ENCOUNTER — Encounter: Payer: Self-pay | Admitting: Family Medicine

## 2019-03-06 ENCOUNTER — Other Ambulatory Visit: Payer: Self-pay

## 2019-03-06 ENCOUNTER — Encounter: Payer: Self-pay | Admitting: Gastroenterology

## 2019-03-06 ENCOUNTER — Ambulatory Visit (INDEPENDENT_AMBULATORY_CARE_PROVIDER_SITE_OTHER): Payer: Medicare Other | Admitting: Gastroenterology

## 2019-03-06 DIAGNOSIS — K3189 Other diseases of stomach and duodenum: Secondary | ICD-10-CM

## 2019-03-06 DIAGNOSIS — K31A0 Gastric intestinal metaplasia, unspecified: Secondary | ICD-10-CM

## 2019-03-06 DIAGNOSIS — R197 Diarrhea, unspecified: Secondary | ICD-10-CM | POA: Diagnosis not present

## 2019-03-06 NOTE — Progress Notes (Signed)
Tonya Antigua, MD [address]  [place]  [place], [place] [address]  Main: [phone]  Fax: [phone]   Primary Care Physician: Valerie Roys, DO  Virtual Visit via Telephone Note  I connected with patient on 03/06/19 at 10:00 AM EDT by telephone and verified that I am speaking with the correct person using two identifiers.   I discussed the limitations, risks, security and privacy concerns of performing an evaluation and management service by telephone and the availability of in person appointments. I also discussed with the patient that there may be a patient responsible charge related to this service. The patient expressed understanding and agreed to proceed.  Location of Patient: Home Location of Provider: Home Persons involved: Patient and provider only during the visit (nursing staff and front desk staff was involved in communicating with the patient prior to the appointment, reviewing medications and checking them in)   History of Present Illness: Chief Complaint  Patient presents with   Follow-up    diarrhea     HPI: Tonya Cabrera is a 68 y.o. female previously seen for diarrhea now reports significantly improved symptoms with diet changes.  Patient states she had quit smoking and therefore had replaced her smoking habit with eating candy or sugar foods and drinks.  She said she has significantly made changes to this diet and has noticed significant improvement.  Is using Imodium as needed still but has been able to decrease the use of this as well.  Good appetite, no abdominal pain, no altered bowel habits, no blood in stool, no weight loss, no nausea or vomiting.  Previous history: Colonoscopy January 2020 with 1, 5 mm cecal polyp removed and showed tubular adenoma.  Repeat recommended in 5 years.  EGD in January 2020 with white lesions in the esophagus positive KOH prep, treated with fluconazole.  Gastric erythema with biopsies showing focal  intestinal metaplasia, but no dysplasia.  These were gastric mapping biopsies.  No H. pylori was seen on the biopsies.  Initial EGD May 2019 for anemia and melena 1 nonbleeding cratered gastric ulcer with black pigmented spot in the gastric antrum. 7 mm in largest dimension. Treated with APC. Duodenal erythema also reported. Patient placed on PPI twice daily  Repeat EGD, September 2019 Gastric antrum showed healed ulcer site with healthy scar tissue. Biopsy showed H. pylori on IHC stain. Focal intestinal metaplasia was reported.  Patient was treated with triple therapy  Current Outpatient Medications  Medication Sig Dispense Refill   alendronate (FOSAMAX) 70 MG tablet TAKE ONE TABLET BY MOUTH EVERY 7 DAYS ON EMPTY STOMACH WITH FULL GLASS OF WATER 12 tablet 3   aspirin 81 MG EC tablet TAKE 1 TABLET (81 MG TOTAL) BY MOUTH DAILY. 90 tablet 4   atorvastatin (LIPITOR) 40 MG tablet Take 1 tablet (40 mg total) by mouth daily. 90 tablet 1   lisinopril (PRINIVIL,ZESTRIL) 10 MG tablet TAKE 1 TABLET BY MOUTH EVERY DAY 90 tablet 0   metoprolol succinate (TOPROL-XL) 25 MG 24 hr tablet TAKE 1 TABLET BY MOUTH TWICE A DAY 180 tablet 0   No current facility-administered medications for this visit.     Allergies as of 03/06/2019   (No Known Allergies)    Review of Systems:    All systems reviewed and negative except where noted in HPI.   Observations/Objective:  Labs: CMP     Component Value Date/Time   NA 143 07/08/2018 1138   NA 144 05/16/2014 0020  K 5.4 (H) 07/08/2018 1138   K 3.9 05/16/2014 0020   CL 104 07/08/2018 1138   CL 109 (H) 05/16/2014 0020   CO2 23 07/08/2018 1138   CO2 23 05/16/2014 0020   GLUCOSE 94 07/08/2018 1138   GLUCOSE 91 02/20/2018 0531   GLUCOSE 121 (H) 05/16/2014 0020   BUN 21 07/08/2018 1138   BUN 9 05/16/2014 0020   CREATININE 0.93 07/08/2018 1138   CREATININE 0.90 05/16/2014 0020   CALCIUM 10.0 07/08/2018 1138   CALCIUM 8.2 (L) 05/16/2014 0020     PROT 6.8 07/08/2018 1138   PROT 7.5 05/15/2014 0842   ALBUMIN 4.6 07/08/2018 1138   ALBUMIN 4.1 05/15/2014 0842   AST 15 07/08/2018 1138   AST 89 (H) 05/15/2014 0842   ALT 19 07/08/2018 1138   ALT 22 05/15/2014 0842   ALKPHOS 87 07/08/2018 1138   ALKPHOS 108 05/15/2014 0842   BILITOT 2.0 (H) 07/08/2018 1138   BILITOT 1.0 05/15/2014 0842   GFRNONAA 64 07/08/2018 1138   GFRNONAA >60 05/16/2014 0020   GFRAA 74 07/08/2018 1138   GFRAA >60 05/16/2014 0020   Lab Results  Component Value Date   WBC 7.4 07/08/2018   HGB 13.3 07/08/2018   HCT 36.7 07/08/2018   MCV 86 07/08/2018   PLT 231 07/08/2018    Imaging Studies: No results found.  Assessment and Plan:   Tonya Cabrera is a 68 y.o. y/o female with diarrhea now resolved after making diet changes and decreasing intake of candy, substances with sugar and sodas  Assessment and Plan: Continue to decrease Imodium use as diarrhea continues to get better No alarm symptoms present Colonoscopy up-to-date Repeat colonoscopy in 5 years from January 2020 Repeat EGD in 3 years from January 2020 for surveillance of intestinal metaplasia and gastric mapping biopsies Status post H. pylori eradication  Follow Up Instructions: Follow-up in 1 year or earlier as needed   I discussed the assessment and treatment plan with the patient. The patient was provided an opportunity to ask questions and all were answered. The patient agreed with the plan and demonstrated an understanding of the instructions.   The patient was advised to call back or seek an in-person evaluation if the symptoms worsen or if the condition fails to improve as anticipated.  I provided 10 minutes of non-face-to-face time during this encounter. Additional time was spent in reviewing patient's chart, placing orders etc.   Tonya Manifold, MD  Speech recognition software was used to dictate this note.

## 2019-03-22 ENCOUNTER — Encounter: Payer: Medicare Other | Admitting: Family Medicine

## 2019-03-27 ENCOUNTER — Other Ambulatory Visit: Payer: Self-pay | Admitting: Family Medicine

## 2019-04-03 DIAGNOSIS — E785 Hyperlipidemia, unspecified: Secondary | ICD-10-CM | POA: Diagnosis not present

## 2019-04-03 DIAGNOSIS — I208 Other forms of angina pectoris: Secondary | ICD-10-CM | POA: Diagnosis not present

## 2019-04-03 DIAGNOSIS — R011 Cardiac murmur, unspecified: Secondary | ICD-10-CM | POA: Diagnosis not present

## 2019-04-03 DIAGNOSIS — I10 Essential (primary) hypertension: Secondary | ICD-10-CM | POA: Diagnosis not present

## 2019-04-03 DIAGNOSIS — I25119 Atherosclerotic heart disease of native coronary artery with unspecified angina pectoris: Secondary | ICD-10-CM | POA: Diagnosis not present

## 2019-05-02 ENCOUNTER — Ambulatory Visit (INDEPENDENT_AMBULATORY_CARE_PROVIDER_SITE_OTHER): Payer: Medicare Other | Admitting: Family Medicine

## 2019-05-02 ENCOUNTER — Encounter: Payer: Self-pay | Admitting: Family Medicine

## 2019-05-02 ENCOUNTER — Other Ambulatory Visit: Payer: Self-pay

## 2019-05-02 VITALS — BP 128/76 | HR 76 | Temp 98.3°F | Ht 63.39 in | Wt 141.5 lb

## 2019-05-02 DIAGNOSIS — I129 Hypertensive chronic kidney disease with stage 1 through stage 4 chronic kidney disease, or unspecified chronic kidney disease: Secondary | ICD-10-CM

## 2019-05-02 DIAGNOSIS — R32 Unspecified urinary incontinence: Secondary | ICD-10-CM

## 2019-05-02 DIAGNOSIS — I700 Atherosclerosis of aorta: Secondary | ICD-10-CM | POA: Diagnosis not present

## 2019-05-02 DIAGNOSIS — E782 Mixed hyperlipidemia: Secondary | ICD-10-CM | POA: Diagnosis not present

## 2019-05-02 DIAGNOSIS — I25119 Atherosclerotic heart disease of native coronary artery with unspecified angina pectoris: Secondary | ICD-10-CM

## 2019-05-02 DIAGNOSIS — D649 Anemia, unspecified: Secondary | ICD-10-CM

## 2019-05-02 DIAGNOSIS — Z7189 Other specified counseling: Secondary | ICD-10-CM | POA: Diagnosis not present

## 2019-05-02 DIAGNOSIS — F3340 Major depressive disorder, recurrent, in remission, unspecified: Secondary | ICD-10-CM

## 2019-05-02 DIAGNOSIS — Z0000 Encounter for general adult medical examination without abnormal findings: Secondary | ICD-10-CM | POA: Diagnosis not present

## 2019-05-02 DIAGNOSIS — F339 Major depressive disorder, recurrent, unspecified: Secondary | ICD-10-CM

## 2019-05-02 DIAGNOSIS — Z1239 Encounter for other screening for malignant neoplasm of breast: Secondary | ICD-10-CM

## 2019-05-02 DIAGNOSIS — M810 Age-related osteoporosis without current pathological fracture: Secondary | ICD-10-CM

## 2019-05-02 LAB — UA/M W/RFLX CULTURE, ROUTINE
Bilirubin, UA: NEGATIVE
Glucose, UA: NEGATIVE
Ketones, UA: NEGATIVE
Leukocytes,UA: NEGATIVE
Nitrite, UA: NEGATIVE
Protein,UA: NEGATIVE
Specific Gravity, UA: 1.015 (ref 1.005–1.030)
Urobilinogen, Ur: 0.2 mg/dL (ref 0.2–1.0)
pH, UA: 5 (ref 5.0–7.5)

## 2019-05-02 LAB — MICROALBUMIN, URINE WAIVED
Creatinine, Urine Waived: 50 mg/dL (ref 10–300)
Microalb, Ur Waived: 30 mg/L — ABNORMAL HIGH (ref 0–19)

## 2019-05-02 LAB — MICROSCOPIC EXAMINATION

## 2019-05-02 MED ORDER — ALENDRONATE SODIUM 70 MG PO TABS: ORAL_TABLET | ORAL | 3 refills | Status: DC

## 2019-05-02 MED ORDER — ASPIRIN 81 MG PO TBEC: DELAYED_RELEASE_TABLET | ORAL | 4 refills | Status: DC

## 2019-05-02 MED ORDER — ATORVASTATIN CALCIUM 40 MG PO TABS: 40.0000 mg | ORAL_TABLET | Freq: Every day | ORAL | 1 refills | Status: DC

## 2019-05-02 MED ORDER — METOPROLOL SUCCINATE ER 25 MG PO TB24: 25.0000 mg | ORAL_TABLET | Freq: Two times a day (BID) | ORAL | 1 refills | Status: DC

## 2019-05-02 MED ORDER — LISINOPRIL 10 MG PO TABS: 10.0000 mg | ORAL_TABLET | Freq: Every day | ORAL | 1 refills | Status: DC

## 2019-05-02 NOTE — Patient Instructions (Addendum)
Preventative Services:  Health Risk Assessment and Personalized Prevention Plan: Done today Bone Mass Measurements: Ordered today Breast Cancer Screening: Ordered today CVD Screening:  Ordered today Cervical Cancer Screening: N/A Colon Cancer Screening: Up to date Depression Screening: Done today Diabetes Screening: Done today Glaucoma Screening: See your eye doctor Hepatitis B vaccine: N/A Hepatitis C screening: up to date HIV Screening: up to date Flu Vaccine: Get in the fall Lung cancer Screening: They will call you Obesity Screening: Done today Pneumonia Vaccines (2): Refused STI Screening: N/A  Call to schedule your mammogram and bone density [facility] at [facility]  Address: [place], [place], [place] [address]  Phone: [phone]   Health Maintenance After Age 54 After age 97, you are at a higher risk for certain long-term diseases and infections as well as injuries from falls. Falls are a major cause of broken bones and head injuries in people who are older than age 64. Getting regular preventive care can help to keep you healthy and well. Preventive care includes getting regular testing and making lifestyle changes as recommended by your health care provider. Talk with your health care provider about:  Which screenings and tests you should have. A screening is a test that checks for a disease when you have no symptoms.  A diet and exercise plan that is right for you. What should I know about screenings and tests to prevent falls? Screening and testing are the best ways to find a health problem early. Early diagnosis and treatment give you the best chance of managing medical conditions that are common after age 17. Certain conditions and lifestyle choices may make you more likely to have a fall. Your health care provider may recommend:  Regular vision checks. Poor vision and conditions such as cataracts can make you more likely to have a  fall. If you wear glasses, make sure to get your prescription updated if your vision changes.  Medicine review. Work with your health care provider to regularly review all of the medicines you are taking, including over-the-counter medicines. Ask your health care provider about any side effects that may make you more likely to have a fall. Tell your health care provider if any medicines that you take make you feel dizzy or sleepy.  Osteoporosis screening. Osteoporosis is a condition that causes the bones to get weaker. This can make the bones weak and cause them to break more easily.  Blood pressure screening. Blood pressure changes and medicines to control blood pressure can make you feel dizzy.  Strength and balance checks. Your health care provider may recommend certain tests to check your strength and balance while standing, walking, or changing positions.  Foot health exam. Foot pain and numbness, as well as not wearing proper footwear, can make you more likely to have a fall.  Depression screening. You may be more likely to have a fall if you have a fear of falling, feel emotionally low, or feel unable to do activities that you used to do.  Alcohol use screening. Using too much alcohol can affect your balance and may make you more likely to have a fall. What actions can I take to lower my risk of falls? General instructions  Talk with your health care provider about your risks for falling. Tell your health care provider if: ? You fall. Be sure to tell your health care provider about all falls, even ones that seem minor. ? You feel dizzy, sleepy, or off-balance.  Take over-the-counter and prescription medicines only as told by your health care provider. These include any supplements.  Eat a healthy diet and maintain a healthy weight. A healthy diet includes low-fat dairy products, low-fat (lean) meats, and fiber from whole grains, beans, and lots of fruits and vegetables. Home  safety  Remove any tripping hazards, such as rugs, cords, and clutter.  Install safety equipment such as grab bars in bathrooms and safety rails on stairs.  Keep rooms and walkways well-lit. Activity   Follow a regular exercise program to stay fit. This will help you maintain your balance. Ask your health care provider what types of exercise are appropriate for you.  If you need a cane or Depaz, use it as recommended by your health care provider.  Wear supportive shoes that have nonskid soles. Lifestyle  Do not drink alcohol if your health care provider tells you not to drink.  If you drink alcohol, limit how much you have: ? 0-1 drink a day for women. ? 0-2 drinks a day for men.  Be aware of how much alcohol is in your drink. In the [place] one drink equals one typical bottle of beer (12 oz), one-half glass of wine (5 oz), or one shot of hard liquor (1 oz).  Do not use any products that contain nicotine or tobacco, such as cigarettes and e-cigarettes. If you need help quitting, ask your health care provider. Summary  Having a healthy lifestyle and getting preventive care can help to protect your health and wellness after age 33.  Screening and testing are the best way to find a health problem early and help you avoid having a fall. Early diagnosis and treatment give you the best chance for managing medical conditions that are more common for people who are older than age 72.  Falls are a major cause of broken bones and head injuries in people who are older than age 34. Take precautions to prevent a fall at home.  Work with your health care provider to learn what changes you can make to improve your health and wellness and to prevent falls. This information is not intended to replace advice given to you by your health care provider. Make sure you discuss any questions you have with your health care provider. Document Released: 07/28/2017 Document Revised: 01/05/2019 Document  Reviewed: 07/28/2017 Elsevier Patient Education  2020 [employer].

## 2019-05-02 NOTE — Progress Notes (Signed)
BP 128/76   Pulse 76   Temp 98.3 F (36.8 C)   Ht 5' 3.39" (1.61 m)   Wt 141 lb 8 oz (64.2 kg)   SpO2 100%   BMI 24.76 kg/m    Subjective:    Patient ID: Tonya Cabrera, female    DOB: [birth year], 68 y.o.   MRN: [id number]  HPI: Tonya Cabrera is a 68 y.o. female presenting on 05/02/2019 for comprehensive medical examination. Current medical complaints include:  Bladder sling isn't working any more. Would like to see urology.  HYPERTENSION / HYPERLIPIDEMIA Satisfied with current treatment? yes Duration of hypertension: chronic BP monitoring frequency: not checking BP medication side effects: no Past BP meds: imdur, lisinopril, metoprolol Duration of hyperlipidemia: chronic Cholesterol medication side effects: no Cholesterol supplements: none Past cholesterol medications: atorvastatin Medication compliance: excellent compliance Aspirin: yes Recent stressors: no Recurrent headaches: no Visual changes: no Palpitations: no Dyspnea: no Chest pain: no Lower extremity edema: no Dizzy/lightheaded: no  ANEMIA Anemia status: stable Etiology of anemia: blood loss anemia Duration of anemia treatment: chronic Compliance with treatment: excellent compliance Iron supplementation side effects: no Severity of anemia: mild Fatigue: no Decreased exercise tolerance: no  Dyspnea on exertion: no Palpitations: no Bleeding: no Pica: no  DEPRESSION Mood status: controlled Satisfied with current treatment?: not on anything Symptom severity: mild  Duration of current treatment : chronic Psychotherapy/counseling: no  Depressed mood: no Anxious mood: no Anhedonia: no Significant weight loss or gain: no Insomnia: no  Fatigue: no Feelings of worthlessness or guilt: no Impaired concentration/indecisiveness: no Suicidal ideations: no Hopelessness: no Crying spells: no Depression screen [facility] 2/9 05/02/2019 01/03/2019 07/08/2018 03/04/2018 06/02/2017  Decreased Interest 0 0 0 3 1  Down,  Depressed, Hopeless 0 0 0 3 1  PHQ - 2 Score 0 0 0 6 2  Altered sleeping 2 3 3 3 3   Tired, decreased energy 0 0 1 3 0  Change in appetite 0 0 0 2 0  Feeling bad or failure about yourself  0 0 0 2 0  Trouble concentrating 2 0 3 3 1   Moving slowly or fidgety/restless 0 1 0 3 0  Suicidal thoughts 0 0 0 2 0  PHQ-9 Score 4 4 7 24 6   Difficult doing work/chores Somewhat difficult Not difficult at all Not difficult at all Extremely dIfficult Not difficult at all   Menopausal Symptoms: no  Functional Status Survey: Is the patient deaf or have difficulty hearing?: Yes Does the patient have difficulty seeing, even when wearing glasses/contacts?: No Does the patient have difficulty concentrating, remembering, or making decisions?: Yes Does the patient have difficulty walking or climbing stairs?: No Does the patient have difficulty dressing or bathing?: No Does the patient have difficulty doing errands alone such as visiting a doctor's office or shopping?: No  Fall Risk  05/02/2019 06/02/2017 01/25/2017  Falls in the past year? 0 No Yes  Number falls in past yr: 0 - 2 or more  Injury with Fall? 0 - Yes    Depression Screen Depression screen [facility] 2/9 05/02/2019 01/03/2019 07/08/2018 03/04/2018 06/02/2017  Decreased Interest 0 0 0 3 1  Down, Depressed, Hopeless 0 0 0 3 1  PHQ - 2 Score 0 0 0 6 2  Altered sleeping 2 3 3 3 3   Tired, decreased energy 0 0 1 3 0  Change in appetite 0 0 0 2 0  Feeling bad or failure about yourself  0 0 0 2 0  Trouble concentrating  2 0 3 3 1   Moving slowly or fidgety/restless 0 1 0 3 0  Suicidal thoughts 0 0 0 2 0  PHQ-9 Score 4 4 7 24 6   Difficult doing work/chores Somewhat difficult Not difficult at all Not difficult at all Extremely dIfficult Not difficult at all   Advanced Directives Does patient have a HCPOA?    no Does patient have a living will or MOST form?  no  Past Medical History:  Past Medical History:  Diagnosis Date  . Acute gastric ulcer without  hemorrhage or perforation   . Acute osteomyelitis of hand ([place]) 03/01/2015  . Acute posthemorrhagic anemia   . AKI (acute kidney injury) ([place]) 02/28/2015  . Arthritis   . Benign neoplasm of cecum   . Coronary artery disease    coronary stent  . Dyspnea   . Gastritis, Helicobacter pylori   . Hard of hearing   . Heart murmur   . Hypercholesteremia   . Hypertension   . Myocardial infarction ([place])    2015  . Osteoporosis   . Peptic ulcer   . Special screening for malignant neoplasms, colon   . Stomach irritation   . Stroke [facility])    10-12 years ago    Surgical History:  Past Surgical History:  Procedure Laterality Date  . CAROTID STENT    . CATARACT EXTRACTION W/PHACO Right 11/09/2017   Procedure: CATARACT EXTRACTION PHACO AND INTRAOCULAR LENS PLACEMENT ([place]) RIGHT;  Surgeon: Eulogio Bear, MD;  Location: [place];  Service: Ophthalmology;  Laterality: Right;  . CATARACT EXTRACTION W/PHACO Left 11/23/2017   Procedure: CATARACT EXTRACTION PHACO AND INTRAOCULAR LENS PLACEMENT ([place]) LEFT;  Surgeon: Eulogio Bear, MD;  Location: [place];  Service: Ophthalmology;  Laterality: Left;  . COLONOSCOPY WITH PROPOFOL N/A 10/06/2018   Procedure: COLONOSCOPY WITH PROPOFOL;  Surgeon: Virgel Manifold, MD;  Location: [facility];  Service: Endoscopy;  Laterality: N/A;  . ESOPHAGOGASTRODUODENOSCOPY (EGD) WITH PROPOFOL N/A 02/22/2018   Procedure: ESOPHAGOGASTRODUODENOSCOPY (EGD) WITH PROPOFOL;  Surgeon: Virgel Manifold, MD;  Location: [facility];  Service: Endoscopy;  Laterality: N/A;  . ESOPHAGOGASTRODUODENOSCOPY (EGD) WITH PROPOFOL N/A 06/08/2018   Procedure: ESOPHAGOGASTRODUODENOSCOPY (EGD) WITH PROPOFOL;  Surgeon: Virgel Manifold, MD;  Location: [facility];  Service: Endoscopy;  Laterality: N/A;  . ESOPHAGOGASTRODUODENOSCOPY (EGD) WITH PROPOFOL N/A 10/06/2018   Procedure: ESOPHAGOGASTRODUODENOSCOPY (EGD) WITH PROPOFOL;  Surgeon: Virgel Manifold,  MD;  Location: [facility];  Service: Endoscopy;  Laterality: N/A;  . INCONTINENCE SURGERY    . THYROID SURGERY      Medications:  Current Outpatient Medications on File Prior to Visit  Medication Sig  . isosorbide mononitrate (IMDUR) 30 MG 24 hr tablet Take 30 mg by mouth daily.   No current facility-administered medications on file prior to visit.     Allergies:  No Known Allergies  Social History:  Social History   Socioeconomic History  . Marital status: Divorced    Spouse name: Not on file  . Number of children: Not on file  . Years of education: Not on file  . Highest education level: Not on file  Occupational History  . Not on file  Social Needs  . Financial resource strain: Not on file  . Food insecurity    Worry: Not on file    Inability: Not on file  . Transportation needs    Medical: Not on file    Non-medical: Not on file  Tobacco Use  . Smoking status: Former Smoker  Packs/day: 1.50    Years: 22.00    Pack years: 33.00    Quit date: 05/08/2014    Years since quitting: 4.9  . Smokeless tobacco: Never Used  . Tobacco comment: Smoked over 25 years, quit after Heart Attack in 2015.    Substance and Sexual Activity  . Alcohol use: No    Alcohol/week: 0.0 standard drinks  . Drug use: No  . Sexual activity: Not Currently  Lifestyle  . Physical activity    Days per week: Not on file    Minutes per session: Not on file  . Stress: Not on file  Relationships  . Social Herbalist on phone: Not on file    Gets together: Not on file    Attends religious service: Not on file    Active member of club or organization: Not on file    Attends meetings of clubs or organizations: Not on file    Relationship status: Not on file  . Intimate partner violence    Fear of current or ex partner: Not on file    Emotionally abused: Not on file    Physically abused: Not on file    Forced sexual activity: Not on file  Other Topics Concern  . Not on file   Social History Narrative  . Not on file   Social History   Tobacco Use  Smoking Status Former Smoker  . Packs/day: 1.50  . Years: 22.00  . Pack years: 33.00  . Quit date: 05/08/2014  . Years since quitting: 4.9  Smokeless Tobacco Never Used  Tobacco Comment   Smoked over 25 years, quit after Heart Attack in 2015.     Social History   Substance and Sexual Activity  Alcohol Use No  . Alcohol/week: 0.0 standard drinks    Family History:  Family History  Problem Relation Age of Onset  . Liver cancer Brother   . Heart Problems Mother   . Breast cancer Neg Hx     Past medical history, surgical history, medications, allergies, family history and social history reviewed with patient today and changes made to appropriate areas of the chart.   Review of Systems  Constitutional: Negative.   HENT: Positive for tinnitus. Negative for congestion, ear discharge, ear pain, hearing loss, nosebleeds, sinus pain and sore throat.   Eyes: Negative.   Respiratory: Negative.  Negative for stridor.   Cardiovascular: Positive for chest pain. Negative for palpitations, orthopnea, claudication, leg swelling and PND.  Gastrointestinal: Positive for diarrhea. Negative for abdominal pain, blood in stool, constipation, heartburn, melena, nausea and vomiting.  Genitourinary: Negative.   Musculoskeletal: Negative.   Skin: Negative.   Neurological: Negative.   Endo/Heme/Allergies: Negative for environmental allergies and polydipsia. Bruises/bleeds easily.  Psychiatric/Behavioral: Negative.     All other ROS negative except what is listed above and in the HPI.      Objective:    BP 128/76   Pulse 76   Temp 98.3 F (36.8 C)   Ht 5' 3.39" (1.61 m)   Wt 141 lb 8 oz (64.2 kg)   SpO2 100%   BMI 24.76 kg/m   Wt Readings from Last 3 Encounters:  05/02/19 141 lb 8 oz (64.2 kg)  10/06/18 132 lb (59.9 kg)  08/16/18 135 lb 3.2 oz (61.3 kg)    Physical Exam Vitals signs and nursing note  reviewed.  Constitutional:      General: She is not in acute distress.    Appearance: Normal  appearance. She is not ill-appearing, toxic-appearing or diaphoretic.  HENT:     Head: Normocephalic and atraumatic.     Right Ear: Tympanic membrane, ear canal and external ear normal. There is no impacted cerumen.     Left Ear: Tympanic membrane, ear canal and external ear normal. There is no impacted cerumen.     Nose: Nose normal. No congestion or rhinorrhea.     Mouth/Throat:     Mouth: Mucous membranes are moist.     Pharynx: Oropharynx is clear. No oropharyngeal exudate or posterior oropharyngeal erythema.  Eyes:     General: No scleral icterus.       Right eye: No discharge.        Left eye: No discharge.     Extraocular Movements: Extraocular movements intact.     Conjunctiva/sclera: Conjunctivae normal.     Pupils: Pupils are equal, round, and reactive to light.  Neck:     Musculoskeletal: Normal range of motion and neck supple. No neck rigidity or muscular tenderness.     Vascular: No carotid bruit.  Cardiovascular:     Rate and Rhythm: Normal rate and regular rhythm.     Pulses: Normal pulses.     Heart sounds: No murmur. No friction rub. No gallop.   Pulmonary:     Effort: Pulmonary effort is normal. No respiratory distress.     Breath sounds: Normal breath sounds. No stridor. No wheezing, rhonchi or rales.  Chest:     Chest wall: No tenderness.  Abdominal:     General: Abdomen is flat. Bowel sounds are normal. There is no distension.     Palpations: Abdomen is soft. There is no mass.     Tenderness: There is no abdominal tenderness. There is no right CVA tenderness, left CVA tenderness, guarding or rebound.     Hernia: No hernia is present.  Genitourinary:    Comments: Breast and pelvic exams deferred with shared decision making Musculoskeletal:        General: No swelling, tenderness, deformity or signs of injury.     Right lower leg: No edema.     Left lower leg: No  edema.  Lymphadenopathy:     Cervical: No cervical adenopathy.  Skin:    General: Skin is warm and dry.     Capillary Refill: Capillary refill takes less than 2 seconds.     Coloration: Skin is not jaundiced or pale.     Findings: No bruising, erythema, lesion or rash.  Neurological:     General: No focal deficit present.     Mental Status: She is alert and oriented to person, place, and time. Mental status is at baseline.     Cranial Nerves: No cranial nerve deficit.     Sensory: No sensory deficit.     Motor: No weakness.     Coordination: Coordination normal.     Gait: Gait normal.     Deep Tendon Reflexes: Reflexes normal.  Psychiatric:        Mood and Affect: Mood normal.        Behavior: Behavior normal.        Thought Content: Thought content normal.        Judgment: Judgment normal.     6CIT Screen 05/02/2019 06/02/2017  What Year? 0 points 0 points  What month? 0 points 0 points  What time? 0 points 0 points  Count back from 20 0 points 0 points  Months in reverse 0 points 0 points  Repeat phrase 0 points 0 points  Total Score 0 0    Results for orders placed or performed during the hospital encounter of 10/06/18  KOH prep   Specimen: Bronchial Brush  Result Value Ref Range   Specimen Description BRONCHIAL BRUSHING    Special Requests Normal    KOH Prep      BUDDING YEAST SEEN Performed at [facility], [address] [place], [place] [address]    Report Status 10/06/2018 FINAL   Surgical pathology  Result Value Ref Range   SURGICAL PATHOLOGY      Surgical Pathology CASE: [id number] PATIENT: [name] Surgical Pathology Report     SPECIMEN SUBMITTED: A. Stomach, antrum, greater curvature, cbx B. Stomach, antrum, lesser curvature; cbx C. Stomach, angularis; cbx D. Stomach, body, greater curvature,cbx E. Stomach, body, lesser curvature;cbx F. Colon polyp, cecum; cbx  CLINICAL HISTORY: None provided  PRE-OPERATIVE DIAGNOSIS:  Z12.11 screening colonoscopy; Z86.19 HX of H Pylori  POST-OPERATIVE DIAGNOSIS: Gastric erythema, colon polyp, diverticulosis     DIAGNOSIS: A.  STOMACH, ANTRUM GREATER CURVATURE; COLD BIOPSY: - ANTRAL MUCOSA WITH REACTIVE GASTRITIS AND FOCAL INTESTINAL METAPLASIA. - NEGATIVE FOR H. PYLORI, DYSPLASIA, AND MALIGNANCY.  B.  STOMACH, ANTRUM LESSER CURVATURE; COLD BIOPSY: - ANTRAL MUCOSA WITH REACTIVE GASTRITIS WITH VERY FOCAL INTESTINAL METAPLASIA. - NEGATIVE FOR H. PYLORI, DYSPLASIA, AND MALIGNANCY.  C.  STOMACH, ANGULARIS; COLD BIOPSY: - OXYNTIC MUCOSA WITH MILD CHRON IC GASTRITIS. - NEGATIVE FOR INTESTINAL METAPLASIA, H. PYLORI, DYSPLASIA, AND MALIGNANCY.  D.  STOMACH, BODY GREATER CURVATURE; COLD BIOPSY: - OXYNTIC MUCOSA WITH MILD CHRONIC GASTRITIS. - NEGATIVE FOR INTESTINAL METAPLASIA, H. PYLORI, DYSPLASIA, AND MALIGNANCY.  E.  STOMACH, BODY LESSER CURVATURE; COLD BIOPSY: - OXYNTIC MUCOSA WITH MINIMAL CHRONIC GASTRITIS. - NEGATIVE FOR INTESTINAL METAPLASIA, H. PYLORI, DYSPLASIA, AND MALIGNANCY.  F.  COLON POLYP, CECUM; COLD BIOPSY: - TUBULAR ADENOMA. - NEGATIVE FOR HIGH-GRADE DYSPLASIA AND MALIGNANCY.   GROSS DESCRIPTION: A. Labeled: Greater curvature antrum of stomach C BX Received: Formalin Tissue fragment(s): Multiple Size: Aggregate, 0.7 x 0.5 x 0.1 cm Description: Tan soft tissue fragments Entirely submitted in 1 cassette.  B. Labeled: Lesser curvature antrum of stomach C BX Received: Formalin Tissue fragment(s): Multiple Size: Aggregate, 0.6 x 0.4 x 0.1 cm Description: Tan soft tissue fragments Entirely submi tted in 1 cassette.  C. Labeled: Angularis C BX Received: Formalin Tissue fragment(s): 2 Size: 0.3-0.4 cm Description: Tan soft tissue fragments Entirely submitted in 1 cassette.  D. Labeled: Greater curvature body of stomach C BX Received: Formalin Tissue fragment(s): 2 Size: 0.2-0.3 cm Description: Tan soft tissue fragments Entirely  submitted in 1 cassette.  E. Labeled: Lesser curvature body of stomach C BX Received: Formalin Tissue fragment(s): 2 Size: 0.3-0.4 cm Description: Tan soft tissue fragments Entirely submitted in 1 cassette.  F. Labeled: Cecal polyp C BX Received: Formalin Tissue fragment(s): 1 Size: 0.2 cm Description: Tan soft tissue fragment Entirely submitted in 1 cassette.   Final Diagnosis performed by Quay Burow, MD.   Electronically signed 10/07/2018 9:56:27AM The electronic signature indicates that the named Attending Pathologist has evaluated the specimen  Technical component performed at [facility], [address], [place],  [place] [address] Lab: [phone] Dir: Rush Farmer, MD, MMM  Professional component performed at [facility], [facility], [place], [place], [place] [address] Lab: [phone] Dir: Dellia Nims. Reuel Derby, MD   Cytology - Non PAP;  Result Value Ref Range   CYTOLOGY - NON GYN      Cytology - Non  PAP CASE: [id number] PATIENT: [name] Non-Gyn Cytology Report     SPECIMEN SUBMITTED: A. Esophageal; brushing  CLINICAL HISTORY: None provided  PRE-OPERATIVE DIAGNOSIS: Z12.11 screening colonoscopy; Z86.19 HX of H Pylori  POST-OPERATIVE DIAGNOSIS: White nummular lesions were noted in the mid esophagus.     DIAGNOSIS: A.  ESOPHAGEAL BRUSHINGS: - NEGATIVE FOR MALIGNANCY. - BENIGN SQUAMES WITH FOCAL BACTERIAL DEBRIS CONSISTENT WITH ORAL CONTAMINATION. - NEGATIVE FOR YEAST / FUNGUS; GMS STAIN EXAMINED.  Comment: A concurrent specimen was submitted to the clinical laboratory for KOH prep and budding yeast were identified.  Stain control worked appropriately.  GROSS DESCRIPTION: A. Labeled: Esophageal brushings Received: Fresh Volume: 2 mL Description: Clear fluid in a plastic, white screw top container Submitted for ThinPrep and cell block   Final Diagnosis performed by Quay Burow, MD.   Electronically signed  10/10/2018 1 0:24:30AM The electronic signature indicates that the named Attending Pathologist has evaluated the specimen  Technical component performed at [facility], [address], [place], [place] [address] Lab: [phone] Dir: Rush Farmer, MD, MMM  Professional component performed at [facility], [address] [facility] [address], [place], [place], [place] [address] Lab: [phone] Dir: Dellia Nims. Rubinas, MD       Assessment & Plan:   Problem List Items Addressed This Visit      Cardiovascular and Mediastinum   CAD (coronary artery disease)    Will keep BP and cholesterol under good control. Continue to follow with cardiology. Call with any concerns.       Relevant Medications   isosorbide mononitrate (IMDUR) 30 MG 24 hr tablet   aspirin 81 MG EC tablet   atorvastatin (LIPITOR) 40 MG tablet   lisinopril (ZESTRIL) 10 MG tablet   metoprolol succinate (TOPROL-XL) 25 MG 24 hr tablet   Other Relevant Orders   CBC with Differential/Platelet   Comprehensive metabolic panel   TSH   UA/M w/rflx Culture, Routine   Aortic atherosclerosis (HCC)    Will keep BP and cholesterol under good control. Continue to follow with cardiology. Call with any concerns.       Relevant Medications   isosorbide mononitrate (IMDUR) 30 MG 24 hr tablet   aspirin 81 MG EC tablet   atorvastatin (LIPITOR) 40 MG tablet   lisinopril (ZESTRIL) 10 MG tablet   metoprolol succinate (TOPROL-XL) 25 MG 24 hr tablet   Other Relevant Orders   CBC with Differential/Platelet   Comprehensive metabolic panel   Lipid Panel w/o Chol/HDL Ratio   TSH   UA/M w/rflx Culture, Routine     Musculoskeletal and Integument   Osteoporosis    Due for recheck on DEXA as it's been 3 years- ordered today.      Relevant Medications   alendronate (FOSAMAX) 70 MG tablet   Other Relevant Orders   DG Bone Density     Genitourinary   Benign hypertensive renal disease    Under good control on current regimen. Continue current  regimen. Continue to monitor. Call with any concerns. Refills given. Labs drawn today.       Relevant Orders   CBC with Differential/Platelet   Comprehensive metabolic panel   Microalbumin, Urine Waived   TSH   UA/M w/rflx Culture, Routine     Other   Hyperlipidemia    Under good control on current regimen. Continue current regimen. Continue to monitor. Call with any concerns. Refills given. Labs drawn today.      Relevant Medications   isosorbide mononitrate (IMDUR) 30 MG 24 hr tablet  aspirin 81 MG EC tablet   atorvastatin (LIPITOR) 40 MG tablet   lisinopril (ZESTRIL) 10 MG tablet   metoprolol succinate (TOPROL-XL) 25 MG 24 hr tablet   Other Relevant Orders   CBC with Differential/Platelet   Comprehensive metabolic panel   Lipid Panel w/o Chol/HDL Ratio   TSH   UA/M w/rflx Culture, Routine   Symptomatic anemia    Rechecking levels today. Await results. Treat as needed.       Relevant Orders   CBC with Differential/Platelet   Comprehensive metabolic panel   TSH   UA/M w/rflx Culture, Routine   Depression, major, recurrent, in remission ([place])    Under good control off medicine. Continue to monitor. Call with any concerns.       Advance directive discussed with patient    A voluntary discussion about advance care planning including the explanation and discussion of advance directives was extensively discussed  with the patient for 5 minutes with patient present.  Explanation about the health care proxy and Living will was reviewed and packet with forms with explanation of how to fill them out was given.  During this discussion, the patient was not able to identify a health care proxy and plans to fill out the paperwork required.  Patient was offered a separate [place] visit for further assistance with forms.          Other Visit Diagnoses    Encounter for Medicare annual wellness exam    -  Primary   Preventative care discussed today as below.     Routine general medical examination at a health care facility       Vaccines declined, screening labs checked today.    Screening for breast cancer       Mammogram ordered today   Relevant Orders   MM DIGITAL SCREENING BILATERAL   Urinary incontinence, unspecified type       Would like to see urology. Referral generated today.   Relevant Orders   Ambulatory referral to Urology       Preventative Services:  Health Risk Assessment and Personalized Prevention Plan: Done today Bone Mass Measurements: Ordered today Breast Cancer Screening: Ordered today CVD Screening:  Ordered today Cervical Cancer Screening: N/A Colon Cancer Screening: Up to date Depression Screening: Done today Diabetes Screening: Done today Glaucoma Screening: See your eye doctor Hepatitis B vaccine: N/A Hepatitis C screening: up to date HIV Screening: up to date Flu Vaccine: Get in the fall Lung cancer Screening: They will call you Obesity Screening: Done today Pneumonia Vaccines (2): Refused STI Screening: N/A  Follow up plan: Return in about 6 months (around 11/02/2019) for follow up.   LABORATORY TESTING:  - Pap smear: not applicable  IMMUNIZATIONS:   - Tdap: Tetanus vaccination status reviewed: last tetanus booster within 10 years. - Influenza: Postponed to flu season - Pneumovax: Up to date - Prevnar: Refused - Zostavax vaccine: Not applicable  SCREENING: -Mammogram: Ordered today  - Colonoscopy: Up to date  - Bone Density: Ordered today   PATIENT COUNSELING:   Advised to take 1 mg of folate supplement per day if capable of pregnancy.   Sexuality: Discussed sexually transmitted diseases, partner selection, use of condoms, avoidance of unintended pregnancy  and contraceptive alternatives.   Advised to avoid cigarette smoking.  I discussed with the patient that most people either abstain from alcohol or drink within safe limits (<=14/week and <=4 drinks/occasion for males, <=7/weeks and <= 3  drinks/occasion for females) and that  the risk for alcohol disorders and other health effects rises proportionally with the number of drinks per week and how often a drinker exceeds daily limits.  Discussed cessation/primary prevention of drug use and availability of treatment for abuse.   Diet: Encouraged to adjust caloric intake to maintain  or achieve ideal body weight, to reduce intake of dietary saturated fat and total fat, to limit sodium intake by avoiding high sodium foods and not adding table salt, and to maintain adequate dietary potassium and calcium preferably from fresh fruits, vegetables, and low-fat dairy products.    stressed the importance of regular exercise  Injury prevention: Discussed safety belts, safety helmets, smoke detector, smoking near bedding or upholstery.   Dental health: Discussed importance of regular tooth brushing, flossing, and dental visits.    NEXT PREVENTATIVE PHYSICAL DUE IN 1 YEAR. Return in about 6 months (around 11/02/2019) for follow up.

## 2019-05-02 NOTE — Assessment & Plan Note (Signed)
Rechecking levels today. Await results. Treat as needed.  

## 2019-05-02 NOTE — Assessment & Plan Note (Signed)
Under good control on current regimen. Continue current regimen. Continue to monitor. Call with any concerns. Refills given. Labs drawn today.   

## 2019-05-02 NOTE — Assessment & Plan Note (Signed)
Will keep BP and cholesterol under good control. Continue to follow with cardiology. Call with any concerns.  

## 2019-05-02 NOTE — Assessment & Plan Note (Signed)
Under good control off medicine. Continue to monitor. Call with any concerns.

## 2019-05-02 NOTE — Assessment & Plan Note (Signed)
A voluntary discussion about advance care planning including the explanation and discussion of advance directives was extensively discussed  with the patient for 5 minutes with patient present.  Explanation about the health care proxy and Living will was reviewed and packet with forms with explanation of how to fill them out was given.  During this discussion, the patient was not able to identify a health care proxy and plans to fill out the paperwork required.  Patient was offered a separate Advance Care Planning visit for further assistance with forms.    

## 2019-05-02 NOTE — Assessment & Plan Note (Signed)
Due for recheck on DEXA as it's been 3 years- ordered today.

## 2019-05-03 ENCOUNTER — Encounter: Payer: Self-pay | Admitting: Family Medicine

## 2019-05-03 DIAGNOSIS — I10 Essential (primary) hypertension: Secondary | ICD-10-CM | POA: Diagnosis not present

## 2019-05-03 DIAGNOSIS — R011 Cardiac murmur, unspecified: Secondary | ICD-10-CM | POA: Diagnosis not present

## 2019-05-03 DIAGNOSIS — I25119 Atherosclerotic heart disease of native coronary artery with unspecified angina pectoris: Secondary | ICD-10-CM | POA: Diagnosis not present

## 2019-05-03 DIAGNOSIS — I208 Other forms of angina pectoris: Secondary | ICD-10-CM | POA: Diagnosis not present

## 2019-05-03 DIAGNOSIS — E785 Hyperlipidemia, unspecified: Secondary | ICD-10-CM | POA: Diagnosis not present

## 2019-05-03 LAB — COMPREHENSIVE METABOLIC PANEL
ALT: 31 IU/L (ref 0–32)
AST: 25 IU/L (ref 0–40)
Albumin/Globulin Ratio: 2.2 (ref 1.2–2.2)
Albumin: 4.7 g/dL (ref 3.8–4.8)
Alkaline Phosphatase: 106 IU/L (ref 39–117)
BUN/Creatinine Ratio: 21 (ref 12–28)
BUN: 19 mg/dL (ref 8–27)
Bilirubin Total: 1.7 mg/dL — ABNORMAL HIGH (ref 0.0–1.2)
CO2: 21 mmol/L (ref 20–29)
Calcium: 9.8 mg/dL (ref 8.7–10.3)
Chloride: 102 mmol/L (ref 96–106)
Creatinine, Ser: 0.91 mg/dL (ref 0.57–1.00)
GFR calc Af Amer: 75 mL/min/{1.73_m2} (ref >59)
GFR calc non Af Amer: 65 mL/min/{1.73_m2} (ref >59)
Globulin, Total: 2.1 g/dL (ref 1.5–4.5)
Glucose: 83 mg/dL (ref 65–99)
Potassium: 4.4 mmol/L (ref 3.5–5.2)
Sodium: 140 mmol/L (ref 134–144)
Total Protein: 6.8 g/dL (ref 6.0–8.5)

## 2019-05-03 LAB — LIPID PANEL W/O CHOL/HDL RATIO
Cholesterol, Total: 205 mg/dL — ABNORMAL HIGH (ref 100–199)
HDL: 47 mg/dL (ref >39)
LDL Calculated: 93 mg/dL (ref 0–99)
Triglycerides: 326 mg/dL — ABNORMAL HIGH (ref 0–149)
VLDL Cholesterol Cal: 65 mg/dL — ABNORMAL HIGH (ref 5–40)

## 2019-05-03 LAB — CBC WITH DIFFERENTIAL/PLATELET
Basophils Absolute: 0 10*3/uL (ref 0.0–0.2)
Basos: 0 %
EOS (ABSOLUTE): 0.1 10*3/uL (ref 0.0–0.4)
Eos: 1 %
Hematocrit: 38.4 % (ref 34.0–46.6)
Hemoglobin: 13.5 g/dL (ref 11.1–15.9)
Immature Grans (Abs): 0 10*3/uL (ref 0.0–0.1)
Immature Granulocytes: 0 %
Lymphocytes Absolute: 1.9 10*3/uL (ref 0.7–3.1)
Lymphs: 23 %
MCH: 30.8 pg (ref 26.6–33.0)
MCHC: 35.2 g/dL (ref 31.5–35.7)
MCV: 88 fL (ref 79–97)
Monocytes Absolute: 0.5 10*3/uL (ref 0.1–0.9)
Monocytes: 7 %
Neutrophils Absolute: 5.7 10*3/uL (ref 1.4–7.0)
Neutrophils: 69 %
Platelets: 215 10*3/uL (ref 150–450)
RBC: 4.38 x10E6/uL (ref 3.77–5.28)
RDW: 13.1 % (ref 11.7–15.4)
WBC: 8.2 10*3/uL (ref 3.4–10.8)

## 2019-05-03 LAB — TSH: TSH: 2.6 u[IU]/mL (ref 0.450–4.500)

## 2019-06-19 ENCOUNTER — Other Ambulatory Visit: Payer: Self-pay

## 2019-06-19 ENCOUNTER — Ambulatory Visit (INDEPENDENT_AMBULATORY_CARE_PROVIDER_SITE_OTHER): Payer: Medicare Other | Admitting: Urology

## 2019-06-19 ENCOUNTER — Encounter: Payer: Self-pay | Admitting: Urology

## 2019-06-19 VITALS — BP 136/72 | HR 71 | Ht 63.0 in | Wt 141.4 lb

## 2019-06-19 DIAGNOSIS — N3281 Overactive bladder: Secondary | ICD-10-CM | POA: Insufficient documentation

## 2019-06-19 DIAGNOSIS — R32 Unspecified urinary incontinence: Secondary | ICD-10-CM

## 2019-06-19 DIAGNOSIS — N3941 Urge incontinence: Secondary | ICD-10-CM

## 2019-06-19 LAB — MICROSCOPIC EXAMINATION: RBC, Urine: NONE SEEN /hpf (ref 0–2)

## 2019-06-19 LAB — URINALYSIS, COMPLETE
Bilirubin, UA: NEGATIVE
Glucose, UA: NEGATIVE
Ketones, UA: NEGATIVE
Nitrite, UA: NEGATIVE
Protein,UA: NEGATIVE
Specific Gravity, UA: <1.005 — ABNORMAL LOW (ref 1.005–1.030)
Urobilinogen, Ur: 0.2 mg/dL (ref 0.2–1.0)
pH, UA: 5.5 (ref 5.0–7.5)

## 2019-06-19 LAB — BLADDER SCAN AMB NON-IMAGING

## 2019-06-19 MED ORDER — SOLIFENACIN SUCCINATE 5 MG PO TABS: 5.0000 mg | ORAL_TABLET | Freq: Every day | ORAL | 3 refills | Status: DC

## 2019-06-19 NOTE — Progress Notes (Signed)
06/19/2019 9:28 AM   Tonya Cabrera [birth year] [id number]  Referring provider: Valerie Roys, DO [place],  [place] [id number]  Chief Complaint  Patient presents with  . Urinary Incontinence    HPI: Tonya Cabrera is a 68 y.o. female seen at the request of Park Liter, DO for evaluation of urinary incontinence.  She presents with a one-month history of urinary frequency, urgency with occasional episodes of urge incontinence.  She denies recurrent UTI, dysuria, hematuria.  She has no flank, abdominal or pelvic pain.  No previous treatment for her symptoms.  She underwent a pubovaginal sling in early 2000 for stress urinary incontinence with resolution of the symptoms.  She has no previous neurologic history, lower extremity weakness or bowel symptoms.   PMH: Past Medical History:  Diagnosis Date  . Acute gastric ulcer without hemorrhage or perforation   . Acute osteomyelitis of hand ([place]) 03/01/2015  . Acute posthemorrhagic anemia   . AKI (acute kidney injury) ([place]) 02/28/2015  . Arthritis   . Benign neoplasm of cecum   . Coronary artery disease    coronary stent  . Dyspnea   . Gastritis, Helicobacter pylori   . Hard of hearing   . Heart murmur   . Hypercholesteremia   . Hypertension   . Myocardial infarction ([place])    2015  . Osteoporosis   . Peptic ulcer   . Special screening for malignant neoplasms, colon   . Stomach irritation   . Stroke [facility])    10-12 years ago    Surgical History: Past Surgical History:  Procedure Laterality Date  . CAROTID STENT    . CATARACT EXTRACTION W/PHACO Right 11/09/2017   Procedure: CATARACT EXTRACTION PHACO AND INTRAOCULAR LENS PLACEMENT ([place]) RIGHT;  Surgeon: Eulogio Bear, MD;  Location: [place];  Service: Ophthalmology;  Laterality: Right;  . CATARACT EXTRACTION W/PHACO Left 11/23/2017   Procedure: CATARACT EXTRACTION PHACO AND INTRAOCULAR LENS PLACEMENT ([place]) LEFT;  Surgeon: Eulogio Bear, MD;  Location:  [place];  Service: Ophthalmology;  Laterality: Left;  . COLONOSCOPY WITH PROPOFOL N/A 10/06/2018   Procedure: COLONOSCOPY WITH PROPOFOL;  Surgeon: Virgel Manifold, MD;  Location: [facility];  Service: Endoscopy;  Laterality: N/A;  . ESOPHAGOGASTRODUODENOSCOPY (EGD) WITH PROPOFOL N/A 02/22/2018   Procedure: ESOPHAGOGASTRODUODENOSCOPY (EGD) WITH PROPOFOL;  Surgeon: Virgel Manifold, MD;  Location: [facility];  Service: Endoscopy;  Laterality: N/A;  . ESOPHAGOGASTRODUODENOSCOPY (EGD) WITH PROPOFOL N/A 06/08/2018   Procedure: ESOPHAGOGASTRODUODENOSCOPY (EGD) WITH PROPOFOL;  Surgeon: Virgel Manifold, MD;  Location: [facility];  Service: Endoscopy;  Laterality: N/A;  . ESOPHAGOGASTRODUODENOSCOPY (EGD) WITH PROPOFOL N/A 10/06/2018   Procedure: ESOPHAGOGASTRODUODENOSCOPY (EGD) WITH PROPOFOL;  Surgeon: Virgel Manifold, MD;  Location: [facility];  Service: Endoscopy;  Laterality: N/A;  . INCONTINENCE SURGERY    . THYROID SURGERY      Home Medications:  Allergies as of 06/19/2019   No Known Allergies     Medication List       Accurate as of June 19, 2019  9:28 AM. If you have any questions, ask your nurse or doctor.        alendronate 70 MG tablet Commonly known as: FOSAMAX Take with a full glass of water on an empty stomach.   aspirin 81 MG EC tablet TAKE 1 TABLET (81 MG TOTAL) BY MOUTH DAILY.   atorvastatin 40 MG tablet Commonly known as: LIPITOR Take 1 tablet (40 mg total) by mouth daily.   isosorbide  mononitrate 30 MG 24 hr tablet Commonly known as: IMDUR Take 30 mg by mouth daily.   lisinopril 10 MG tablet Commonly known as: ZESTRIL Take 1 tablet (10 mg total) by mouth daily.   metoprolol succinate 25 MG 24 hr tablet Commonly known as: TOPROL-XL Take 1 tablet (25 mg total) by mouth 2 (two) times daily.       Allergies: No Known Allergies  Family History: Family History  Problem Relation Age of Onset  . Liver cancer Brother    . Heart Problems Mother   . Breast cancer Neg Hx     Social History:  reports that she quit smoking about 5 years ago. She has a 33.00 pack-year smoking history. She has never used smokeless tobacco. She reports that she does not drink alcohol or use drugs.  ROS: UROLOGY Frequent Urination?: Yes Hard to postpone urination?: Yes Burning/pain with urination?: No Get up at night to urinate?: Yes Leakage of urine?: No Urine stream starts and stops?: No Trouble starting stream?: No Do you have to strain to urinate?: No Blood in urine?: No Urinary tract infection?: No Sexually transmitted disease?: No Injury to kidneys or bladder?: No Painful intercourse?: No Weak stream?: No Currently pregnant?: No Vaginal bleeding?: No Last menstrual period?: N/A  Gastrointestinal Nausea?: No Vomiting?: No Indigestion/heartburn?: No Diarrhea?: No Constipation?: No  Constitutional Fever: No Night sweats?: No Weight loss?: No Fatigue?: No  Skin Skin rash/lesions?: No Itching?: No  Eyes Blurred vision?: No Double vision?: No  Ears/Nose/Throat Sore throat?: No Sinus problems?: No  Hematologic/Lymphatic Swollen glands?: No Easy bruising?: No  Cardiovascular Leg swelling?: No Chest pain?: No  Respiratory Cough?: No Shortness of breath?: No  Endocrine Excessive thirst?: No  Musculoskeletal Back pain?: Yes Joint pain?: No  Neurological Headaches?: No Dizziness?: No  Psychologic Depression?: No Anxiety?: No  Physical Exam: BP 136/72 (BP Location: Left Arm, Patient Position: Sitting, Cuff Size: Normal)   Pulse 71   Ht 5\' 3"  (1.6 m)   Wt 141 lb 6.4 oz (64.1 kg)   BMI 25.05 kg/m   Constitutional:  Alert and oriented, No acute distress. HEENT: [place] AT, moist mucus membranes.  Trachea midline, no masses. Cardiovascular: No clubbing, cyanosis, or edema. Respiratory: Normal respiratory effort, no increased work of breathing. GI: Abdomen is soft, nontender,  nondistended, no abdominal masses GU: No CVA tenderness Lymph: No cervical or inguinal lymphadenopathy. Skin: No rashes, bruises or suspicious lesions. Neurologic: Grossly intact, no focal deficits, moving all 4 extremities. Psychiatric: Normal mood and affect.   Assessment & Plan:    - Overactive bladder with urge incontinence PVR by bladder scan was 53 mL.  Previous urinalysis was unremarkable.  We discussed treatment options including behavioral therapy, pelvic floor physical therapy and medical management.  She is interested in initial trial of medical management.  Anticholinergic Rx was sent to pharmacy based on her insurance formulary.  Will schedule cystoscopy with pelvic exam at time of cystoscopy.  Abbie Sons, [place] [address] [place], [place] [address] [phone]

## 2019-07-07 ENCOUNTER — Other Ambulatory Visit: Payer: Medicare Other | Admitting: Urology

## 2019-07-10 ENCOUNTER — Telehealth: Payer: Self-pay | Admitting: Urology

## 2019-07-10 ENCOUNTER — Encounter: Payer: Self-pay | Admitting: Urology

## 2019-07-10 NOTE — Telephone Encounter (Signed)
Just F.Y.I. pt was a no show for cysto

## 2019-07-11 NOTE — Telephone Encounter (Signed)
Noted.  The indication was incontinence.

## 2019-07-16 DIAGNOSIS — I252 Old myocardial infarction: Secondary | ICD-10-CM | POA: Diagnosis not present

## 2019-07-16 DIAGNOSIS — I10 Essential (primary) hypertension: Secondary | ICD-10-CM | POA: Diagnosis not present

## 2019-07-16 DIAGNOSIS — Z87891 Personal history of nicotine dependence: Secondary | ICD-10-CM | POA: Diagnosis not present

## 2019-07-16 DIAGNOSIS — E079 Disorder of thyroid, unspecified: Secondary | ICD-10-CM | POA: Diagnosis not present

## 2019-07-16 DIAGNOSIS — M25572 Pain in left ankle and joints of left foot: Secondary | ICD-10-CM | POA: Diagnosis not present

## 2019-07-16 DIAGNOSIS — Z7982 Long term (current) use of aspirin: Secondary | ICD-10-CM | POA: Diagnosis not present

## 2019-07-16 DIAGNOSIS — Z7902 Long term (current) use of antithrombotics/antiplatelets: Secondary | ICD-10-CM | POA: Diagnosis not present

## 2019-07-16 DIAGNOSIS — Z7983 Long term (current) use of bisphosphonates: Secondary | ICD-10-CM | POA: Diagnosis not present

## 2019-07-16 DIAGNOSIS — Z79899 Other long term (current) drug therapy: Secondary | ICD-10-CM | POA: Diagnosis not present

## 2019-07-16 DIAGNOSIS — S99912A Unspecified injury of left ankle, initial encounter: Secondary | ICD-10-CM | POA: Diagnosis not present

## 2019-07-16 DIAGNOSIS — S93402A Sprain of unspecified ligament of left ankle, initial encounter: Secondary | ICD-10-CM | POA: Diagnosis not present

## 2019-07-16 DIAGNOSIS — I2510 Atherosclerotic heart disease of native coronary artery without angina pectoris: Secondary | ICD-10-CM | POA: Diagnosis not present

## 2019-07-18 ENCOUNTER — Telehealth: Payer: Self-pay | Admitting: *Deleted

## 2019-07-18 DIAGNOSIS — Z87891 Personal history of nicotine dependence: Secondary | ICD-10-CM

## 2019-07-18 DIAGNOSIS — Z122 Encounter for screening for malignant neoplasm of respiratory organs: Secondary | ICD-10-CM

## 2019-07-18 NOTE — Telephone Encounter (Signed)
Patient has been notified that annual lung cancer screening low dose CT scan is due currently or will be in near future. Confirmed that patient is within the age range of 55-77, and asymptomatic, (no signs or symptoms of lung cancer). Patient denies illness that would prevent curative treatment for lung cancer if found. Verified smoking history, (former, quit 2015, 33 pack year). The shared decision making visit was done 07/07/16. Patient is agreeable for CT scan being scheduled.

## 2019-07-20 ENCOUNTER — Other Ambulatory Visit: Payer: Self-pay

## 2019-07-20 ENCOUNTER — Ambulatory Visit
Admission: RE | Admit: 2019-07-20 | Discharge: 2019-07-20 | Disposition: A | Discharge status: Home / Self Care | Payer: Medicare Other | Source: Ambulatory Visit | Attending: Nurse Practitioner | Admitting: Nurse Practitioner

## 2019-07-20 DIAGNOSIS — Z87891 Personal history of nicotine dependence: Secondary | ICD-10-CM | POA: Diagnosis not present

## 2019-07-20 DIAGNOSIS — Z122 Encounter for screening for malignant neoplasm of respiratory organs: Secondary | ICD-10-CM | POA: Insufficient documentation

## 2019-07-27 ENCOUNTER — Encounter: Payer: Self-pay | Admitting: *Deleted

## 2019-07-31 ENCOUNTER — Ambulatory Visit (INDEPENDENT_AMBULATORY_CARE_PROVIDER_SITE_OTHER): Payer: Medicare Other | Admitting: Urology

## 2019-07-31 ENCOUNTER — Encounter: Payer: Self-pay | Admitting: Urology

## 2019-07-31 ENCOUNTER — Other Ambulatory Visit: Payer: Self-pay | Admitting: Urology

## 2019-07-31 ENCOUNTER — Other Ambulatory Visit: Payer: Self-pay

## 2019-07-31 VITALS — BP 156/78 | HR 76 | Ht 63.0 in | Wt 141.0 lb

## 2019-07-31 DIAGNOSIS — N3281 Overactive bladder: Secondary | ICD-10-CM

## 2019-07-31 DIAGNOSIS — N3941 Urge incontinence: Secondary | ICD-10-CM

## 2019-07-31 MED ORDER — LIDOCAINE HCL URETHRAL/MUCOSAL 2 % EX GEL
1.0000 "application " | Freq: Once | CUTANEOUS | Status: AC
  Administered 2019-07-31: 1 via URETHRAL

## 2019-07-31 NOTE — Progress Notes (Signed)
   07/31/19  CC:  Chief Complaint  Patient presents with  . Cysto    HPI: 68 y.o. female seen 06/19/2019 with storage elated voiding symptoms and urge incontinence.  History of prior pubovaginal sling for stress incontinence which has not recurred.  She was given a trial of solifenacin 5 mg daily which has not improved her symptoms.    Blood pressure (!) 156/78, pulse 76, height 5\' 3"  (1.6 m), weight 141 lb (64 kg). NED. A&Ox3.   No respiratory distress   Abd soft, NT, ND Normal external genitalia with patent urethral meatus  Cystoscopy Procedure Note  Patient identification was confirmed, informed consent was obtained, and patient was prepped using Betadine solution.  Lidocaine jelly was administered per urethral meatus.    Procedure: - Flexible cystoscope introduced, without any difficulty.   - Thorough search of the bladder revealed:    normal urethral meatus    normal urothelium    no stones    no ulcers     no tumors    no urethral polyps    no trabeculation    no foreign bodies  - Ureteral orifices were normal in position and appearance.  Post-Procedure: - Patient tolerated the procedure well  Assessment/ Plan: No significant abnormalities identified on cystoscopy. No improvement in her symptoms on solifenacin which she will discontinue.  She was given samples of Myrbetriq 25 mg daily.  Follow-up with Larene [facility] approximately 4 weeks for reassessment of symptoms on Myrbetriq.   Abbie Sons, MD

## 2019-08-01 ENCOUNTER — Encounter: Payer: Self-pay | Admitting: Urology

## 2019-08-01 LAB — URINALYSIS, COMPLETE
Bilirubin, UA: NEGATIVE
Glucose, UA: NEGATIVE
Ketones, UA: NEGATIVE
Leukocytes,UA: NEGATIVE
Nitrite, UA: NEGATIVE
Protein,UA: NEGATIVE
Specific Gravity, UA: 1.02 (ref 1.005–1.030)
Urobilinogen, Ur: 0.2 mg/dL (ref 0.2–1.0)
pH, UA: 5 (ref 5.0–7.5)

## 2019-08-01 LAB — MICROSCOPIC EXAMINATION

## 2019-08-29 NOTE — Progress Notes (Deleted)
08/30/2019 9:00 PM   Tonya Cabrera 1951/04/02 [id number]  Referring provider: Valerie Roys, DO [place],  [place] [address]  No chief complaint on file.   HPI: Mrs. Tong is a 68 year old female with OAB with urge incontinence who presents today after a trial of Myrbetriq.  The patient is  experiencing urgency x *** (***), frequency x *** (***), not/is restricting fluids to avoid visits to the restroom ***, not/is engaging in toilet mapping, incontinence x *** (***) and nocturia x *** (***).   Her BP is ***.   Her PVR is ***.   Negative cysto 07/2019.    PMH: Past Medical History:  Diagnosis Date  . Acute gastric ulcer without hemorrhage or perforation   . Acute osteomyelitis of hand ([place]) 03/01/2015  . Acute posthemorrhagic anemia   . AKI (acute kidney injury) ([place]) 02/28/2015  . Arthritis   . Benign neoplasm of cecum   . Coronary artery disease    coronary stent  . Dyspnea   . Gastritis, Helicobacter pylori   . Hard of hearing   . Heart murmur   . Hypercholesteremia   . Hypertension   . Myocardial infarction ([place])    2015  . Osteoporosis   . Peptic ulcer   . Special screening for malignant neoplasms, colon   . Stomach irritation   . Stroke [facility])    10-12 years ago    Surgical History: Past Surgical History:  Procedure Laterality Date  . CAROTID STENT    . CATARACT EXTRACTION W/PHACO Right 11/09/2017   Procedure: CATARACT EXTRACTION PHACO AND INTRAOCULAR LENS PLACEMENT ([place]) RIGHT;  Surgeon: Eulogio Bear, MD;  Location: [place];  Service: Ophthalmology;  Laterality: Right;  . CATARACT EXTRACTION W/PHACO Left 11/23/2017   Procedure: CATARACT EXTRACTION PHACO AND INTRAOCULAR LENS PLACEMENT ([place]) LEFT;  Surgeon: Eulogio Bear, MD;  Location: [place];  Service: Ophthalmology;  Laterality: Left;  . COLONOSCOPY WITH PROPOFOL N/A 10/06/2018   Procedure: COLONOSCOPY WITH PROPOFOL;  Surgeon: Virgel Manifold, MD;  Location:  [facility];  Service: Endoscopy;  Laterality: N/A;  . ESOPHAGOGASTRODUODENOSCOPY (EGD) WITH PROPOFOL N/A 02/22/2018   Procedure: ESOPHAGOGASTRODUODENOSCOPY (EGD) WITH PROPOFOL;  Surgeon: Virgel Manifold, MD;  Location: [facility];  Service: Endoscopy;  Laterality: N/A;  . ESOPHAGOGASTRODUODENOSCOPY (EGD) WITH PROPOFOL N/A 06/08/2018   Procedure: ESOPHAGOGASTRODUODENOSCOPY (EGD) WITH PROPOFOL;  Surgeon: Virgel Manifold, MD;  Location: [facility];  Service: Endoscopy;  Laterality: N/A;  . ESOPHAGOGASTRODUODENOSCOPY (EGD) WITH PROPOFOL N/A 10/06/2018   Procedure: ESOPHAGOGASTRODUODENOSCOPY (EGD) WITH PROPOFOL;  Surgeon: Virgel Manifold, MD;  Location: [facility];  Service: Endoscopy;  Laterality: N/A;  . INCONTINENCE SURGERY    . THYROID SURGERY      Home Medications:  Allergies as of 08/30/2019   No Known Allergies     Medication List       Accurate as of August 29, 2019  9:00 PM. If you have any questions, ask your nurse or doctor.        alendronate 70 MG tablet Commonly known as: FOSAMAX Take with a full glass of water on an empty stomach.   aspirin 81 MG EC tablet TAKE 1 TABLET (81 MG TOTAL) BY MOUTH DAILY.   atorvastatin 40 MG tablet Commonly known as: LIPITOR Take 1 tablet (40 mg total) by mouth daily.   isosorbide mononitrate 30 MG 24 hr tablet Commonly known as: IMDUR Take 30 mg by mouth daily.   lisinopril  10 MG tablet Commonly known as: ZESTRIL Take 1 tablet (10 mg total) by mouth daily.   metoprolol succinate 25 MG 24 hr tablet Commonly known as: TOPROL-XL Take 1 tablet (25 mg total) by mouth 2 (two) times daily.   solifenacin 5 MG tablet Commonly known as: VESICARE TAKE 1 TABLET BY MOUTH EVERY DAY       Allergies: No Known Allergies  Family History: Family History  Problem Relation Age of Onset  . Liver cancer Brother   . Heart Problems Mother   . Breast cancer Neg Hx     Social History:  reports that she quit smoking  about 5 years ago. She has a 33.00 pack-year smoking history. She has never used smokeless tobacco. She reports that she does not drink alcohol or use drugs.  ROS:                                        Physical Exam: There were no vitals taken for this visit.  Constitutional:  Well nourished. Alert and oriented, No acute distress. HEENT: [place] AT, moist mucus membranes.  Trachea midline, no masses. Cardiovascular: No clubbing, cyanosis, or edema. Respiratory: Normal respiratory effort, no increased work of breathing. GI: Abdomen is soft, non tender, non distended, no abdominal masses. Liver and spleen not palpable.  No hernias appreciated.  Stool sample for occult testing is not indicated.   GU: No CVA tenderness.  No bladder fullness or masses.  *** external genitalia, *** pubic hair distribution, no lesions.  Normal urethral meatus, no lesions, no prolapse, no discharge.   No urethral masses, tenderness and/or tenderness. No bladder fullness, tenderness or masses. *** vagina mucosa, *** estrogen effect, no discharge, no lesions, *** pelvic support, *** cystocele and *** rectocele noted.  No cervical motion tenderness.  Uterus is freely mobile and non-fixed.  No adnexal/parametria masses or tenderness noted.  Anus and perineum are without rashes or lesions.   ***  Skin: No rashes, bruises or suspicious lesions. Lymph: No cervical or inguinal adenopathy. Neurologic: Grossly intact, no focal deficits, moving all 4 extremities. Psychiatric: Normal mood and affect.   Laboratory Data: Lab Results  Component Value Date   WBC 8.2 05/02/2019   HGB 13.5 05/02/2019   HCT 38.4 05/02/2019   MCV 88 05/02/2019   PLT 215 05/02/2019    Lab Results  Component Value Date   CREATININE 0.91 05/02/2019    No results found for: PSA  No results found for: TESTOSTERONE  Lab Results  Component Value Date   HGBA1C 5.3 01/25/2017    Lab Results  Component Value Date   TSH  2.600 05/02/2019       Component Value Date/Time   CHOL 205 (H) 05/02/2019 1510   CHOL 235 (H) 06/08/2016 0953   CHOL 192 05/16/2014 0020   HDL 47 05/02/2019 1510   HDL 48 05/16/2014 0020   CHOLHDL 3.3 04/09/2016 1828   VLDL 29 06/08/2016 0953   VLDL 21 05/16/2014 0020   LDLCALC 93 05/02/2019 1510   LDLCALC 123 (H) 05/16/2014 0020    Lab Results  Component Value Date   AST 25 05/02/2019   Lab Results  Component Value Date   ALT 31 05/02/2019   No components found for: ALKALINEPHOPHATASE No components found for: BILIRUBINTOTAL  No results found for: ESTRADIOL  Urinalysis    Component Value Date/Time   COLORURINE YELLOW (A)  02/19/2018 0900   APPEARANCEUR Clear 07/31/2019 1456   LABSPEC 1.020 02/19/2018 0900   PHURINE 5.0 02/19/2018 0900   GLUCOSEU Negative 07/31/2019 1456   HGBUR MODERATE (A) 02/19/2018 0900   BILIRUBINUR Negative 07/31/2019 1456   KETONESUR NEGATIVE 02/19/2018 0900   PROTEINUR Negative 07/31/2019 1456   PROTEINUR NEGATIVE 02/19/2018 0900   NITRITE Negative 07/31/2019 1456   NITRITE NEGATIVE 02/19/2018 0900   LEUKOCYTESUR Negative 07/31/2019 1456    I have reviewed the labs.   Pertinent Imaging: *** I have independently reviewed the films.    Assessment & Plan:  ***  1. OAB and Urge Incontinence  - offered behavioral therapies  - bladder training  - bladder control strategies  - pelvic floor muscle training  - fluid management   - offered medical therapy with anticholinergic therapy or beta-3 adrenergic receptor agonist and the potential side effects of each therapy ***  - offered refer to gynecology for a pessary fitting ***  - offered an appointment with one of our surgeon for a possible pelvic sling procedure ***  - would like to try the beta-3 adrenergic receptor agonist (Myrbetriq).  Given Myrbetriq 25 mg samples, #28.  I have reviewed with the patient of the side effects of Myrbetriq, such as: elevation in BP, urinary retention  and/or HA.  She will return in one month for PVR and symptom recheck.  ***  - would like to try anticholinergic therapy.  Given Vesicare 5mg /10mg , Toviaz 4mg /8mg  samples, # 28.   Advised of the side effects, such as: Dry eyes, dry mouth, constipation, mental confusion and/or urinary retention. ***  - RTC in 3 weeks for PVR and symptom recheck ***     No follow-ups on file.  These notes generated with voice recognition software. I apologize for typographical errors.  Zara Council, PA-C  [facility] [address]  [place], [place] [address] [phone]

## 2019-08-30 ENCOUNTER — Encounter: Payer: Self-pay | Admitting: Urology

## 2019-08-30 ENCOUNTER — Ambulatory Visit: Payer: Medicare Other | Admitting: Urology

## 2019-09-26 ENCOUNTER — Telehealth: Payer: Self-pay | Admitting: Family Medicine

## 2019-09-26 NOTE — Chronic Care Management (AMB) (Signed)
  Chronic Care Management   Note  09/26/2019 Name: Tonya Cabrera MRN: [id number] DOB: [birth year]  Tonya Cabrera is a 68 y.o. year old female who is a primary care patient of Valerie Roys, DO. I reached out to [name] by phone today in response to a referral sent by Tonya Cabrera [facility] health plan.     Tonya Cabrera was given information about Chronic Care Management services today including:  1. CCM service includes personalized support from designated clinical staff supervised by her physician, including individualized plan of care and coordination with other care providers 2. 24/7 contact phone numbers for assistance for urgent and routine care needs. 3. Service will only be billed when office clinical staff spend 20 minutes or more in a month to coordinate care. 4. Only one practitioner may furnish and bill the service in a calendar month. 5. The patient may stop CCM services at any time (effective at the end of the month) by phone call to the office staff. 6. The patient will be responsible for cost sharing (co-pay) of up to 20% of the service fee (after annual deductible is met).  Patient agreed to services and verbal consent obtained.   Follow up plan: Telephone appointment with CCM team member scheduled for: 10/27/2019  [facility]  [place],  [id number] Direct Dial: [email or web address]  Website: [email or web address]

## 2019-10-27 ENCOUNTER — Encounter: Payer: Self-pay | Admitting: General Practice

## 2019-10-27 ENCOUNTER — Telehealth: Payer: Medicare Other | Admitting: General Practice

## 2019-10-27 ENCOUNTER — Ambulatory Visit (INDEPENDENT_AMBULATORY_CARE_PROVIDER_SITE_OTHER): Payer: Medicare Other | Admitting: General Practice

## 2019-10-27 DIAGNOSIS — I700 Atherosclerosis of aorta: Secondary | ICD-10-CM

## 2019-10-27 DIAGNOSIS — I129 Hypertensive chronic kidney disease with stage 1 through stage 4 chronic kidney disease, or unspecified chronic kidney disease: Secondary | ICD-10-CM | POA: Diagnosis not present

## 2019-10-27 DIAGNOSIS — I25119 Atherosclerotic heart disease of native coronary artery with unspecified angina pectoris: Secondary | ICD-10-CM | POA: Diagnosis not present

## 2019-10-27 DIAGNOSIS — E782 Mixed hyperlipidemia: Secondary | ICD-10-CM | POA: Diagnosis not present

## 2019-10-27 NOTE — Patient Instructions (Signed)
Visit Information  Goals Addressed            This Visit's Progress   . RNCM: Chronic disease management and support       Current Barriers:  . Chronic Disease Management support, education, and care coordination needs related to CAD, HTN, HLD, and Aortic Atherosclerosis  . Clinical Goal(s) related to : CAD, HTN, HLD, and Aortic Atherosclerosis:  Over the next 60 days, patient will:  . Work with the care management team to address educational, disease management, and care coordination needs  . Begin or continue self health monitoring activities as directed today Measure and record blood pressure at least 2  times per week and discuss with pcp if she needs to take her BP daily and record, follow a heart healthy diet . Call provider office for new or worsened signs and symptoms Blood pressure findings outside established parameters and New or worsened symptom related to numbness and loss of feeling in the tips of her fingers on each hand (new concern for the patient) . Call care management team with questions or concerns . Verbalize basic understanding of patient centered plan of care established today  . Interventions related to CAD, HTN, HLD, and Aortic Atherosclerosis:  . Evaluation of current treatment plans and patient's adherence to plan as established by provider . Assessed patient understanding of disease states . Assessed patient's education and care coordination needs . Provided disease specific education to patient  . Collaborated with appropriate clinical care team members regarding patient needs  . Patient Self Care Activities related to CAD, HTN, HLD, and Aortic Atherosclerosis: . Patient is unable to independently self-manage chronic health conditions  Initial goal documentation     . RNCM: I really just eat what I want to       Current Barriers:  Marland Kitchen Knowledge Deficits related to adherence to a low sodium heart healthy diet  Nurse Case Manager Clinical Goal(s):   Marland Kitchen Over the next 60 days, patient will verbalize understanding of plan for watching sodium and fat content in her diet  . Over the next 60 days, patient will attend all scheduled medical appointments: Next appointment with pcp on 11/02/2019 . Over the next 60 days, patient will demonstrate improved health management independence as evidenced bymonitoring blood pressures in the home environment, maintaining a heart healthy diet, and increasing activity . Over the next 60 days, patient will verbalize basic understanding of CAD/HLD/HTN/ And Aortic Atherosclerosis disease process and self health management plan as evidenced by starting healthy life style changes to promote health and well being . Over the next 60 days, patient will demonstrate understanding of rationale for each prescribed medication as evidenced by compliance and asking questions for any concerns she has with medications . Over the next 60 days, the patient will demonstrate ongoing self health care management ability as evidenced by improved dietary restrictions, increased physical activity, BP monitoring in the home environment as directed by pcp  Interventions:  . Evaluation of current treatment plan related to chronic disease processes and patient's adherence to plan as established by provider. . Provided education to patient re: benefits of a low sodium, heart healthy diet . Reviewed medications with patient and discussed cost constraints and compliance (no issues noted with medications) . Discussed plans with patient for ongoing care management follow up and provided patient with direct contact information for care management team . Advised patient, providing education and rationale, to monitor blood pressure daily and record, calling pcp for findings outside  established parameters.  . Reviewed scheduled/upcoming provider appointments including: next appointment with pcp on 11-02-2019  Patient Self Care Activities:  . Patient  verbalizes understanding of plan to follow a low sodium, heart healthy diet . Attends all scheduled provider appointments . Unable to independently manage chronic health conditions  Initial goal documentation        Ms. Grall was given information about Chronic Care Management services today including:  1. CCM service includes personalized support from designated clinical staff supervised by her physician, including individualized plan of care and coordination with other care providers 2. 24/7 contact phone numbers for assistance for urgent and routine care needs. 3. Service will only be billed when office clinical staff spend 20 minutes or more in a month to coordinate care. 4. Only one practitioner may furnish and bill the service in a calendar month. 5. The patient may stop CCM services at any time (effective at the end of the month) by phone call to the office staff. 6. The patient will be responsible for cost sharing (co-pay) of up to 20% of the service fee (after annual deductible is met).  Patient agreed to services and verbal consent obtained.   The patient verbalized understanding of instructions provided today and declined a print copy of patient instruction materials.   The care management team will reach out to the patient again over the next 60 days.   Noreene Larsson RN, MSN, [facility] Mobile: [phone]

## 2019-10-27 NOTE — Chronic Care Management (AMB) (Signed)
Chronic Care Management   Initial Visit Note  10/27/2019 Name: Tonya Cabrera MRN: [id number] DOB: 10-16-1950  Referred by: Valerie Roys, DO Reason for referral : Chronic Care Management (Initial: CAD/HLD/HTN/Aortic Atherosclerosis)   Tonya Cabrera is a 69 y.o. year old female who is a primary care patient of Valerie Roys, DO. The CCM team was consulted for assistance with chronic disease management and care coordination needs related to CAD, HTN, HLD and Aortic Atherosclerosis  Review of patient status, including review of consultants reports, relevant laboratory and other test results, and collaboration with appropriate care team members and the patient's provider was performed as part of comprehensive patient evaluation and provision of chronic care management services.    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 See Care Plan for related entries.   Medications: Outpatient Encounter Medications as of 10/27/2019  Medication Sig  . alendronate (FOSAMAX) 70 MG tablet Take with a full glass of water on an empty stomach.  Marland Kitchen aspirin 81 MG EC tablet TAKE 1 TABLET (81 MG TOTAL) BY MOUTH DAILY.  Marland Kitchen atorvastatin (LIPITOR) 40 MG tablet Take 1 tablet (40 mg total) by mouth daily.  . isosorbide mononitrate (IMDUR) 30 MG 24 hr tablet Take 30 mg by mouth daily.  Marland Kitchen lisinopril (ZESTRIL) 10 MG tablet Take 1 tablet (10 mg total) by mouth daily.  . metoprolol succinate (TOPROL-XL) 25 MG 24 hr tablet Take 1 tablet (25 mg total) by mouth 2 (two) times daily.  . solifenacin (VESICARE) 5 MG tablet TAKE 1 TABLET BY MOUTH EVERY DAY   No facility-administered encounter medications on file as of 10/27/2019.     Objective: BP Readings from Last 3 Encounters:  07/31/19 (!) 156/78  06/19/19 136/72  05/02/19 128/76    Lab Results  Component Value  Date   CHOL 205 (H) 05/02/2019   HDL 47 05/02/2019   LDLCALC 93 05/02/2019   TRIG 326 (H) 05/02/2019   CHOLHDL 3.3 04/09/2016    Goals Addressed            This Visit's Progress   . RNCM: Chronic disease management and support       Current Barriers:  . Chronic Disease Management support, education, and care coordination needs related to CAD, HTN, HLD, and Aortic Atherosclerosis  . Clinical Goal(s) related to : CAD, HTN, HLD, and Aortic Atherosclerosis:  Over the next 60 days, patient will:  . Work with the care management team to address educational, disease management, and care coordination needs  . Begin or continue self health monitoring activities as directed today Measure and record blood pressure at least 2  times per week and discuss with pcp if she needs to take her BP daily and record, follow a heart healthy diet . Call provider office for new or worsened signs and symptoms Blood pressure findings outside established parameters and New or worsened symptom related to numbness and loss of feeling in the tips of her fingers on each hand (new concern for the patient) . Call care management team with questions or concerns . Verbalize basic understanding of patient centered plan of care established today  . Interventions related to CAD, HTN, HLD, and Aortic Atherosclerosis:  . Evaluation of current treatment plans and patient's adherence to plan as established by provider . Assessed patient understanding of disease states . Assessed patient's education and care coordination needs .  Provided disease specific education to patient  . Collaborated with appropriate clinical care team members regarding patient needs  . Patient Self Care Activities related to CAD, HTN, HLD, and Aortic Atherosclerosis: . Patient is unable to independently self-manage chronic health conditions  Initial goal documentation     . RNCM: I really just eat what I want to       Current Barriers:   Marland Kitchen Knowledge Deficits related to adherence to a low sodium heart healthy diet  Nurse Case Manager Clinical Goal(s):  Marland Kitchen Over the next 60 days, patient will verbalize understanding of plan for watching sodium and fat content in her diet  . Over the next 60 days, patient will attend all scheduled medical appointments: Next appointment with pcp on 11/02/2019 . Over the next 60 days, patient will demonstrate improved health management independence as evidenced bymonitoring blood pressures in the home environment, maintaining a heart healthy diet, and increasing activity . Over the next 60 days, patient will verbalize basic understanding of CAD/HLD/HTN/ And Aortic Atherosclerosis disease process and self health management plan as evidenced by starting healthy life style changes to promote health and well being . Over the next 60 days, patient will demonstrate understanding of rationale for each prescribed medication as evidenced by compliance and asking questions for any concerns she has with medications . Over the next 60 days, the patient will demonstrate ongoing self health care management ability as evidenced by improved dietary restrictions, increased physical activity, BP monitoring in the home environment as directed by pcp  Interventions:  . Evaluation of current treatment plan related to chronic disease processes and patient's adherence to plan as established by provider. . Provided education to patient re: benefits of a low sodium, heart healthy diet . Reviewed medications with patient and discussed cost constraints and compliance (no issues noted with medications) . Discussed plans with patient for ongoing care management follow up and provided patient with direct contact information for care management team . Advised patient, providing education and rationale, to monitor blood pressure daily and record, calling pcp for findings outside established parameters.  . Reviewed scheduled/upcoming  provider appointments including: next appointment with pcp on 11-02-2019  Patient Self Care Activities:  . Patient verbalizes understanding of plan to follow a low sodium, heart healthy diet . Attends all scheduled provider appointments . Unable to independently manage chronic health conditions  Initial goal documentation         Ms. Choma was given information about Chronic Care Management services today including:  1. CCM service includes personalized support from designated clinical staff supervised by her physician, including individualized plan of care and coordination with other care providers 2. 24/7 contact phone numbers for assistance for urgent and routine care needs. 3. Service will only be billed when office clinical staff spend 20 minutes or more in a month to coordinate care. 4. Only one practitioner may furnish and bill the service in a calendar month. 5. The patient may stop CCM services at any time (effective at the end of the month) by phone call to the office staff. 6. The patient will be responsible for cost sharing (co-pay) of up to 20% of the service fee (after annual deductible is met).  Patient agreed to services and verbal consent obtained.   Plan:   The care management team will reach out to the patient again over the next 60 days.   Noreene Larsson RN, MSN, [facility] Mobile: [phone]

## 2019-10-31 ENCOUNTER — Other Ambulatory Visit: Payer: Self-pay | Admitting: Family Medicine

## 2019-10-31 NOTE — Telephone Encounter (Signed)
Routing to provider  

## 2019-10-31 NOTE — Telephone Encounter (Signed)
Requested medication (s) are due for refill today: yes  Requested medication (s) are on the active medication list: yes  Last refill: 08/03/2019  Future visit scheduled:yes  Notes to clinic: Patient has appointment in 2 days   Requested Prescriptions  Pending Prescriptions Disp Refills   lisinopril (ZESTRIL) 10 MG tablet [Pharmacy Med Name: LISINOPRIL 10 MG TABLET] 90 tablet 1    Sig: TAKE 1 TABLET BY MOUTH EVERY DAY      Cardiovascular:  ACE Inhibitors Failed - 10/31/2019  1:29 AM      Failed - Cr in normal range and within 180 days    Creatinine  Date Value Ref Range Status  05/16/2014 0.90 0.60 - 1.30 mg/dL Final   Creatinine, Ser  Date Value Ref Range Status  05/02/2019 0.91 0.57 - 1.00 mg/dL Final          Failed - K in normal range and within 180 days    Potassium  Date Value Ref Range Status  05/02/2019 4.4 3.5 - 5.2 mmol/L Final  05/16/2014 3.9 3.5 - 5.1 mmol/L Final          Failed - Last BP in normal range    BP Readings from Last 1 Encounters:  07/31/19 (!) 156/78          Failed - Valid encounter within last 6 months    Recent Outpatient Visits           6 months ago Encounter for [employer] annual wellness exam   [employer], Megan P, DO   10 months ago Diarrhea, unspecified type   [place], Megan P, DO   1 year ago Diarrhea, unspecified type   [facility] [place], [place], DO   1 year ago Essential hypertension   [place], [place], DO   1 year ago [place], [place], DO       Future Appointments             In 2 days [place], Megan P, DO [place] - Patient is not pregnant        metoprolol succinate (TOPROL-XL) 25 MG 24 hr tablet [Pharmacy Med Name: METOPROLOL SUCC ER 25 MG TAB] 180 tablet 1    Sig: TAKE 1 TABLET BY MOUTH TWICE A DAY      Cardiovascular:  Beta Blockers Failed -  10/31/2019  1:29 AM      Failed - Last BP in normal range    BP Readings from Last 1 Encounters:  07/31/19 (!) 156/78          Failed - Valid encounter within last 6 months    Recent Outpatient Visits           6 months ago Encounter for [employer] annual wellness exam   [place], Megan P, DO   10 months ago Diarrhea, unspecified type   [place], Megan P, DO   1 year ago Diarrhea, unspecified type   [place], [place], DO   1 year ago Essential hypertension   [place], [place], DO   1 year ago [place], [place], DO       Future Appointments             In 2 days Wynetta Emery, [place]  P, DO [facility]            Passed - Last Heart Rate in normal range    Pulse Readings from Last 1 Encounters:  07/31/19 76

## 2019-10-31 NOTE — Telephone Encounter (Signed)
Can we see if she has enough to make it to Thursday?

## 2019-11-02 ENCOUNTER — Ambulatory Visit (INDEPENDENT_AMBULATORY_CARE_PROVIDER_SITE_OTHER): Payer: Medicare Other | Admitting: Family Medicine

## 2019-11-02 ENCOUNTER — Encounter: Payer: Self-pay | Admitting: Family Medicine

## 2019-11-02 DIAGNOSIS — M810 Age-related osteoporosis without current pathological fracture: Secondary | ICD-10-CM

## 2019-11-02 DIAGNOSIS — R202 Paresthesia of skin: Secondary | ICD-10-CM

## 2019-11-02 DIAGNOSIS — E782 Mixed hyperlipidemia: Secondary | ICD-10-CM

## 2019-11-02 DIAGNOSIS — F3340 Major depressive disorder, recurrent, in remission, unspecified: Secondary | ICD-10-CM

## 2019-11-02 DIAGNOSIS — I25119 Atherosclerotic heart disease of native coronary artery with unspecified angina pectoris: Secondary | ICD-10-CM

## 2019-11-02 DIAGNOSIS — I700 Atherosclerosis of aorta: Secondary | ICD-10-CM | POA: Diagnosis not present

## 2019-11-02 DIAGNOSIS — I7300 Raynaud's syndrome without gangrene: Secondary | ICD-10-CM | POA: Diagnosis not present

## 2019-11-02 DIAGNOSIS — N3281 Overactive bladder: Secondary | ICD-10-CM

## 2019-11-02 DIAGNOSIS — I129 Hypertensive chronic kidney disease with stage 1 through stage 4 chronic kidney disease, or unspecified chronic kidney disease: Secondary | ICD-10-CM

## 2019-11-02 MED ORDER — ALENDRONATE SODIUM 70 MG PO TABS: ORAL_TABLET | ORAL | 3 refills | Status: DC

## 2019-11-02 MED ORDER — ATORVASTATIN CALCIUM 40 MG PO TABS: 40.0000 mg | ORAL_TABLET | Freq: Every day | ORAL | 1 refills | Status: DC

## 2019-11-02 MED ORDER — LISINOPRIL 10 MG PO TABS: 10.0000 mg | ORAL_TABLET | Freq: Every day | ORAL | 1 refills | Status: DC

## 2019-11-02 MED ORDER — METOPROLOL SUCCINATE ER 25 MG PO TB24: 25.0000 mg | ORAL_TABLET | Freq: Two times a day (BID) | ORAL | 1 refills | Status: DC

## 2019-11-02 MED ORDER — ASPIRIN 81 MG PO TBEC: DELAYED_RELEASE_TABLET | ORAL | 4 refills | Status: DC

## 2019-11-02 NOTE — Assessment & Plan Note (Signed)
Under good control on current regimen. Continue current regimen. Continue to monitor. Call with any concerns. Refills given. Labs to be drawn tomorrow.

## 2019-11-02 NOTE — Progress Notes (Signed)
per   There were no vitals taken for this visit.   Subjective:    Patient ID: Tonya Cabrera, female    DOB: 07/13/1951, 69 y.o.   MRN: [id number]  HPI: Tonya Cabrera is a 69 y.o. female  No chief complaint on file.  Has been having some issues with numbness and color cha  HYPERTENSION / HYPERLIPIDEMIA Satisfied with current treatment? yes Duration of hypertension: chronic BP monitoring frequency: not checking BP medication side effects: no Past BP meds: metoprolol, lisinopril, imdur Duration of hyperlipidemia: chronic Cholesterol medication side effects: no Cholesterol supplements: none Past cholesterol medications: atorvastatin Medication compliance: excellent compliance Aspirin: yes Recent stressors: no Recurrent headaches: no Visual changes: no Palpitations: no Dyspnea: no Chest pain: no Lower extremity edema: no Dizzy/lightheaded: no  DEPRESSION Mood status: controlled Satisfied with current treatment?: yes Symptom severity: mild  Duration of current treatment : not on anything Depressed mood: no Anxious mood: no Anhedonia: no Significant weight loss or gain: no Insomnia: no  Fatigue: no Feelings of worthlessness or guilt: no Impaired concentration/indecisiveness: no Suicidal ideations: no Hopelessness: no Crying spells: no Depression screen [facility] 2/9 11/02/2019 10/27/2019 05/02/2019 01/03/2019 07/08/2018  Decreased Interest 0 0 0 0 0  Down, Depressed, Hopeless 0 0 0 0 0  PHQ - 2 Score 0 0 0 0 0  Altered sleeping 3 - 2 3 3   Tired, decreased energy 0 - 0 0 1  Change in appetite 0 - 0 0 0  Feeling bad or failure about yourself  0 - 0 0 0  Trouble concentrating 0 - 2 0 3  Moving slowly or fidgety/restless 0 - 0 1 0  Suicidal thoughts 0 - 0 0 0  PHQ-9 Score 3 - 4 4 7   Difficult doing work/chores Not difficult at all - Somewhat difficult Not difficult at all Not difficult at all   GAD 7 : Generalized Anxiety Score 01/03/2019  Nervous, Anxious, on Edge 0    Control/stop worrying 3  Worry too much - different things 3  Trouble relaxing 2  Restless 0  Easily annoyed or irritable 0  Afraid - awful might happen 0  Total GAD 7 Score 8  Anxiety Difficulty Not difficult at all   OSTEOPOROSIS  Satisfied with current treatment?: yes Medication side effects: no Medication compliance: excellent compliance Past osteoporosis medications/treatments: fosamax Adequate calcium & vitamin D: yes Intolerance to bisphosphonates:no Weight bearing exercises: yes   Relevant past medical, surgical, family and social history reviewed and updated as indicated. Interim medical history since our last visit reviewed. Allergies and medications reviewed and updated.  Review of Systems  Constitutional: Negative.   Respiratory: Negative.   Cardiovascular: Negative.   Musculoskeletal: Negative.   Neurological: Negative.   Psychiatric/Behavioral: Negative.     Per HPI unless specifically indicated above     Objective:    There were no vitals taken for this visit.  Wt Readings from Last 3 Encounters:  07/31/19 141 lb (64 kg)  07/20/19 140 lb (63.5 kg)  06/19/19 141 lb 6.4 oz (64.1 kg)    Physical Exam Vitals and nursing note reviewed.  Pulmonary:     Effort: Pulmonary effort is normal. No respiratory distress.     Comments: Speaking in full sentences Neurological:     Mental Status: She is alert.  Psychiatric:        Mood and Affect: Mood normal.        Behavior: Behavior normal.        Thought  Content: Thought content normal.        Judgment: Judgment normal.     Results for orders placed or performed in visit on 07/31/19  Microscopic Examination   URINE  Result Value Ref Range   WBC, UA 0-5 0 - 5 /hpf   RBC 0-2 0 - 2 /hpf   Epithelial Cells (non renal) 0-10 0 - 10 /hpf   Renal Epithel, UA 0-10 (A) None seen /hpf   Bacteria, UA Few None seen/Few  Urinalysis, Complete  Result Value Ref Range   Specific Gravity, UA 1.020 1.005 - 1.030    pH, UA 5.0 5.0 - 7.5   Color, UA Yellow Yellow   Appearance Ur Clear Clear   Leukocytes,UA Negative Negative   Protein,UA Negative Negative/Trace   Glucose, UA Negative Negative   Ketones, UA Negative Negative   RBC, UA 2+ (A) Negative   Bilirubin, UA Negative Negative   Urobilinogen, Ur 0.2 0.2 - 1.0 mg/dL   Nitrite, UA Negative Negative   Microscopic Examination See below:       Assessment & Plan:   Problem List Items Addressed This Visit      Cardiovascular and Mediastinum   CAD (coronary artery disease)    Will keep BP and cholesterol under good control. Continue to monitor. Call with any concerns.       Relevant Medications   metoprolol succinate (TOPROL-XL) 25 MG 24 hr tablet   lisinopril (ZESTRIL) 10 MG tablet   atorvastatin (LIPITOR) 40 MG tablet   aspirin 81 MG EC tablet   Other Relevant Orders   Comprehensive metabolic panel   CBC with Differential/Platelet   Aortic atherosclerosis (HCC)    Will keep BP and cholesterol under good control. Continue to monitor. Call with any concerns.       Relevant Medications   metoprolol succinate (TOPROL-XL) 25 MG 24 hr tablet   lisinopril (ZESTRIL) 10 MG tablet   atorvastatin (LIPITOR) 40 MG tablet   aspirin 81 MG EC tablet   Other Relevant Orders   Comprehensive metabolic panel   CBC with Differential/Platelet     Musculoskeletal and Integument   Osteoporosis    Tolerating medicine well. Has been on fosamax for 3 years. Continue for another 2. Continue to monitor.       Relevant Medications   alendronate (FOSAMAX) 70 MG tablet   Other Relevant Orders   Comprehensive metabolic panel   CBC with Differential/Platelet     Genitourinary   Benign hypertensive renal disease    Feeling well. No concerns. Will get BP when she comes in for labs. Recheck labs. Call with any concerns.      Relevant Orders   Comprehensive metabolic panel   CBC with Differential/Platelet   Microalbumin, Urine Waived   Overactive bladder     Stable. Continue with urology. Call with any concerns. Will check urine. Continue to monitor.       Relevant Orders   UA/M w/rflx Culture, Routine     Other   Hyperlipidemia    Under good control on current regimen. Continue current regimen. Continue to monitor. Call with any concerns. Refills given. Labs to be drawn tomorrow.        Relevant Medications   metoprolol succinate (TOPROL-XL) 25 MG 24 hr tablet   lisinopril (ZESTRIL) 10 MG tablet   atorvastatin (LIPITOR) 40 MG tablet   aspirin 81 MG EC tablet   Other Relevant Orders   Comprehensive metabolic panel   CBC with Differential/Platelet  Lipid Panel w/o Chol/HDL Ratio   Depression, major, recurrent, in remission [facility])    Doing well off medicine. Continue to monitor. Call with any concerns.       Relevant Orders   Comprehensive metabolic panel   CBC with Differential/Platelet    Other Visit Diagnoses    Paresthesias    -  Primary    Will check labs. Call with any concerns.    Relevant Orders   Bayer DCA Hb A1c Waived   Comprehensive metabolic panel   CBC with Differential/Platelet   TSH   VITAMIN D 25 Hydroxy (Vit-D Deficiency, Fractures)   B12 and Folate Panel   Raynaud's disease without gangrene       Discussed keeping her hands warm. Will check labs. Call with any concerns.    Relevant Medications   metoprolol succinate (TOPROL-XL) 25 MG 24 hr tablet   lisinopril (ZESTRIL) 10 MG tablet   atorvastatin (LIPITOR) 40 MG tablet   aspirin 81 MG EC tablet   Other Relevant Orders   Comprehensive metabolic panel   CBC with Differential/Platelet       Follow up plan: Return in about 6 months (around 05/01/2020) for Physical .   . This visit was completed via telephone due to the restrictions of the COVID-19 pandemic. All issues as above were discussed and addressed but no physical exam was performed. If it was felt that the patient should be evaluated in the office, they were directed there. The patient  verbally consented to this visit. Patient was unable to complete an audio/visual visit due to Lack of equipment. Due to the catastrophic nature of the COVID-19 pandemic, this visit was done through audio contact only. . Location of the patient: home . Location of the provider: home . Those involved with this call:  . Provider: Park Liter, DO . CMA: Tiffany Reel, CMA . Front Desk/Registration: Don Perking  . Time spent on call: 25 minutes on the phone discussing health concerns. 40 minutes total spent in review of patient's record and preparation of their chart.

## 2019-11-02 NOTE — Assessment & Plan Note (Signed)
Tolerating medicine well. Has been on fosamax for 3 years. Continue for another 2. Continue to monitor.

## 2019-11-02 NOTE — Assessment & Plan Note (Signed)
Will keep BP and cholesterol under good control. Continue to monitor. Call with any concerns.  

## 2019-11-02 NOTE — Assessment & Plan Note (Signed)
Feeling well. No concerns. Will get BP when she comes in for labs. Recheck labs. Call with any concerns.

## 2019-11-02 NOTE — Assessment & Plan Note (Signed)
Doing well off medicine. Continue to monitor. Call with any concerns.  

## 2019-11-02 NOTE — Patient Instructions (Signed)
Raynaud Phenomenon ° °Raynaud phenomenon is a condition that affects the blood vessels (arteries) that carry blood to your fingers and toes. The arteries that supply blood to your ears, lips, nipples, or the tip of your nose might also be affected. Raynaud phenomenon causes the arteries to become narrow temporarily (spasm). As a result, the flow of blood to the affected areas is temporarily decreased. This usually occurs in response to cold temperatures or stress. During an attack, the skin in the affected areas turns white, then blue, and finally red. You may also feel tingling or numbness in those areas. °Attacks usually last for only a brief period, and then the blood flow to the area returns to normal. In most cases, Raynaud phenomenon does not cause serious health problems. °What are the causes? °In many cases, the cause of this condition is not known. The condition may occur on its own (primary Raynaud phenomenon) or may be associated with other diseases or factors (secondary Raynaud phenomenon). °Possible causes may include: °· Diseases or medical conditions that damage the arteries. °· Injuries and repetitive actions that hurt the hands or feet. °· Being exposed to certain chemicals. °· Taking medicines that narrow the arteries. °· Other medical conditions, such as lupus, scleroderma, rheumatoid arthritis, thyroid problems, blood disorders, Sjogren syndrome, or atherosclerosis. °What increases the risk? °The following factors may make you more likely to develop this condition: °· Being 20-40 years old. °· Being female. °· Having a family history of Raynaud phenomenon. °· Living in a cold climate. °· Smoking. °What are the signs or symptoms? °Symptoms of this condition usually occur when you are exposed to cold temperatures or when you have emotional stress. The symptoms may last for a few minutes or up to several hours. They usually affect your fingers but may also affect your toes, nipples, lips, ears, or  the tip of your nose. Symptoms may include: °· Changes in skin color. The skin in the affected areas will turn pale or white. The skin may then change from white to bluish to red as normal blood flow returns to the area. °· Numbness, tingling, or pain in the affected areas. °In severe cases, symptoms may include: °· Skin sores. °· Tissues decaying and dying (gangrene). °How is this diagnosed? °This condition may be diagnosed based on: °· Your symptoms and medical history. °· A physical exam. During the exam, you may be asked to put your hands in cold water to check for a reaction to cold temperature. °· Tests, such as: °? Blood tests to check for other diseases or conditions. °? A test to check the movement of blood through your arteries and veins (vascular ultrasound). °? A test in which the skin at the base of your fingernail is examined under a microscope (nailfold capillaroscopy). °How is this treated? °Treatment for this condition often involves making lifestyle changes and taking steps to control your exposure to cold temperatures. For more severe cases, medicine (calcium channel blockers) may be used to improve blood flow. Surgery is sometimes done to block the nerves that control the affected arteries, but this is rare. °Follow these instructions at home: °Avoiding cold temperatures °Take these steps to avoid exposure to cold: °· If possible, stay indoors during cold weather. °· When you go outside during cold weather, dress in layers and wear mittens, a hat, a scarf, and warm footwear. °· Wear mittens or gloves when handling ice or frozen food. °· Use holders for glasses or cans containing cold drinks. °·   Let warm water run for a while before taking a shower or bath. °· Warm up the car before driving in cold weather. °Lifestyle ° °· If possible, avoid stressful and emotional situations. Try to find ways to manage your stress, such as: °? Exercise. °? Yoga. °? Meditation. °? Biofeedback. °· Do not use any  products that contain nicotine or tobacco, such as cigarettes and e-cigarettes. If you need help quitting, ask your health care provider. °· Avoid secondhand smoke. °· Limit your use of caffeine. °? Switch to decaffeinated coffee, tea, and soda. °? Avoid chocolate. °· Avoid vibrating tools and machinery. °General instructions °· Protect your hands and feet from injuries, cuts, or bruises. °· Avoid wearing tight rings or wristbands. °· Wear loose fitting socks and comfortable, roomy shoes. °· Take over-the-counter and prescription medicines only as told by your health care provider. °Contact a health care provider if: °· Your discomfort becomes worse despite lifestyle changes. °· You develop sores on your fingers or toes that do not heal. °· Your fingers or toes turn black. °· You have breaks in the skin on your fingers or toes. °· You have a fever. °· You have pain or swelling in your joints. °· You have a rash. °· Your symptoms occur on only one side of your body. °Summary °· Raynaud phenomenon is a condition that affects the arteries that carry blood to your fingers, toes, ears, lips, nipples, or the tip of your nose. °· In many cases, the cause of this condition is not known. °· Symptoms of this condition include changes in skin color, and numbness and tingling of the affected area. °· Treatment for this condition includes lifestyle changes, reducing exposure to cold temperatures, and using medicines for severe cases of the condition. °· Contact your health care provider if your condition worsens despite treatment. °This information is not intended to replace advice given to you by your health care provider. Make sure you discuss any questions you have with your health care provider. °Document Revised: 09/17/2017 Document Reviewed: 10/26/2016 °Elsevier Patient Education © 2020 [employer] ° °

## 2019-11-02 NOTE — Assessment & Plan Note (Signed)
Stable. Continue with urology. Call with any concerns. Will check urine. Continue to monitor.

## 2019-11-03 ENCOUNTER — Other Ambulatory Visit: Payer: Medicare Other

## 2019-11-03 ENCOUNTER — Other Ambulatory Visit: Payer: Self-pay

## 2019-11-03 DIAGNOSIS — M810 Age-related osteoporosis without current pathological fracture: Secondary | ICD-10-CM | POA: Diagnosis not present

## 2019-11-03 DIAGNOSIS — I129 Hypertensive chronic kidney disease with stage 1 through stage 4 chronic kidney disease, or unspecified chronic kidney disease: Secondary | ICD-10-CM | POA: Diagnosis not present

## 2019-11-03 DIAGNOSIS — R7301 Impaired fasting glucose: Secondary | ICD-10-CM | POA: Diagnosis not present

## 2019-11-03 DIAGNOSIS — I25119 Atherosclerotic heart disease of native coronary artery with unspecified angina pectoris: Secondary | ICD-10-CM

## 2019-11-03 DIAGNOSIS — I700 Atherosclerosis of aorta: Secondary | ICD-10-CM | POA: Diagnosis not present

## 2019-11-03 DIAGNOSIS — E782 Mixed hyperlipidemia: Secondary | ICD-10-CM

## 2019-11-03 DIAGNOSIS — R8271 Bacteriuria: Secondary | ICD-10-CM | POA: Diagnosis not present

## 2019-11-03 DIAGNOSIS — R202 Paresthesia of skin: Secondary | ICD-10-CM | POA: Diagnosis not present

## 2019-11-03 DIAGNOSIS — F3340 Major depressive disorder, recurrent, in remission, unspecified: Secondary | ICD-10-CM

## 2019-11-03 DIAGNOSIS — I7300 Raynaud's syndrome without gangrene: Secondary | ICD-10-CM | POA: Diagnosis not present

## 2019-11-03 DIAGNOSIS — N3281 Overactive bladder: Secondary | ICD-10-CM | POA: Diagnosis not present

## 2019-11-04 LAB — COMPREHENSIVE METABOLIC PANEL
ALT: 24 IU/L (ref 0–32)
AST: 19 IU/L (ref 0–40)
Albumin/Globulin Ratio: 2 (ref 1.2–2.2)
Albumin: 4.5 g/dL (ref 3.8–4.8)
Alkaline Phosphatase: 117 IU/L (ref 39–117)
BUN/Creatinine Ratio: 18 (ref 12–28)
BUN: 18 mg/dL (ref 8–27)
Bilirubin Total: 2 mg/dL — ABNORMAL HIGH (ref 0.0–1.2)
CO2: 21 mmol/L (ref 20–29)
Calcium: 9.5 mg/dL (ref 8.7–10.3)
Chloride: 107 mmol/L — ABNORMAL HIGH (ref 96–106)
Creatinine, Ser: 1 mg/dL (ref 0.57–1.00)
GFR calc Af Amer: 67 mL/min/{1.73_m2} (ref >59)
GFR calc non Af Amer: 58 mL/min/{1.73_m2} — ABNORMAL LOW (ref >59)
Globulin, Total: 2.3 g/dL (ref 1.5–4.5)
Glucose: 79 mg/dL (ref 65–99)
Potassium: 4.6 mmol/L (ref 3.5–5.2)
Sodium: 143 mmol/L (ref 134–144)
Total Protein: 6.8 g/dL (ref 6.0–8.5)

## 2019-11-04 LAB — CBC WITH DIFFERENTIAL/PLATELET
Basophils Absolute: 0 10*3/uL (ref 0.0–0.2)
Basos: 1 %
EOS (ABSOLUTE): 0.1 10*3/uL (ref 0.0–0.4)
Eos: 2 %
Hematocrit: 38 % (ref 34.0–46.6)
Hemoglobin: 13.4 g/dL (ref 11.1–15.9)
Immature Grans (Abs): 0 10*3/uL (ref 0.0–0.1)
Immature Granulocytes: 0 %
Lymphocytes Absolute: 2 10*3/uL (ref 0.7–3.1)
Lymphs: 26 %
MCH: 31.3 pg (ref 26.6–33.0)
MCHC: 35.3 g/dL (ref 31.5–35.7)
MCV: 89 fL (ref 79–97)
Monocytes Absolute: 0.6 10*3/uL (ref 0.1–0.9)
Monocytes: 7 %
Neutrophils Absolute: 4.8 10*3/uL (ref 1.4–7.0)
Neutrophils: 64 %
Platelets: 214 10*3/uL (ref 150–450)
RBC: 4.28 x10E6/uL (ref 3.77–5.28)
RDW: 12.7 % (ref 11.7–15.4)
WBC: 7.5 10*3/uL (ref 3.4–10.8)

## 2019-11-04 LAB — LIPID PANEL W/O CHOL/HDL RATIO
Cholesterol, Total: 191 mg/dL (ref 100–199)
HDL: 48 mg/dL (ref >39)
LDL Chol Calc (NIH): 78 mg/dL (ref 0–99)
Triglycerides: 407 mg/dL — ABNORMAL HIGH (ref 0–149)
VLDL Cholesterol Cal: 65 mg/dL — ABNORMAL HIGH (ref 5–40)

## 2019-11-04 LAB — VITAMIN D 25 HYDROXY (VIT D DEFICIENCY, FRACTURES): Vit D, 25-Hydroxy: 19.2 ng/mL — ABNORMAL LOW (ref 30.0–100.0)

## 2019-11-04 LAB — B12 AND FOLATE PANEL
Folate: 8.4 ng/mL (ref >3.0)
Vitamin B-12: 272 pg/mL (ref 232–1245)

## 2019-11-04 LAB — TSH: TSH: 2.46 u[IU]/mL (ref 0.450–4.500)

## 2019-11-06 ENCOUNTER — Telehealth: Payer: Self-pay | Admitting: Family Medicine

## 2019-11-06 MED ORDER — NITROFURANTOIN MONOHYD MACRO 100 MG PO CAPS: 100.0000 mg | ORAL_CAPSULE | Freq: Two times a day (BID) | ORAL | 0 refills | Status: DC

## 2019-11-06 MED ORDER — VITAMIN D (ERGOCALCIFEROL) 1.25 MG (50000 UNIT) PO CAPS: 50000.0000 [IU] | ORAL_CAPSULE | ORAL | 0 refills | Status: DC

## 2019-11-06 NOTE — Telephone Encounter (Signed)
Patient notified of results and verbalized understanding.  

## 2019-11-06 NOTE — Telephone Encounter (Signed)
Please let her know that her labs look normal except she has a little UTI and a low vitamin D- so I've sent her through a supplement and an antibiotic. Let me know if she's not feeling better.

## 2019-11-07 LAB — MICROSCOPIC EXAMINATION

## 2019-11-07 LAB — UA/M W/RFLX CULTURE, ROUTINE
Bilirubin, UA: NEGATIVE
Glucose, UA: NEGATIVE
Ketones, UA: NEGATIVE
Nitrite, UA: NEGATIVE
Protein,UA: NEGATIVE
Specific Gravity, UA: 1.015 (ref 1.005–1.030)
Urobilinogen, Ur: 0.2 mg/dL (ref 0.2–1.0)
pH, UA: 5.5 (ref 5.0–7.5)

## 2019-11-07 LAB — URINE CULTURE, REFLEX

## 2019-11-07 LAB — MICROALBUMIN, URINE WAIVED
Creatinine, Urine Waived: 10 mg/dL (ref 10–300)
Microalb, Ur Waived: 10 mg/L (ref 0–19)
Microalb/Creat Ratio: <30 mg/g (ref <30)

## 2019-11-07 LAB — BAYER DCA HB A1C WAIVED: HB A1C (BAYER DCA - WAIVED): 5.7 % (ref <7.0)

## 2019-11-08 ENCOUNTER — Telehealth: Payer: Self-pay | Admitting: Family Medicine

## 2019-11-08 MED ORDER — CIPROFLOXACIN HCL 500 MG PO TABS: 500.0000 mg | ORAL_TABLET | Freq: Two times a day (BID) | ORAL | 0 refills | Status: DC

## 2019-11-08 NOTE — Telephone Encounter (Signed)
Please let her know that her UTI is resistant to the antibiotic we sent though. I want her to stop the nitrofurantoin and I've sent through another antibiotic I want her to start. Thanks!

## 2019-11-08 NOTE — Telephone Encounter (Signed)
Patient notified and verbalized understanding. 

## 2019-11-16 DIAGNOSIS — I129 Hypertensive chronic kidney disease with stage 1 through stage 4 chronic kidney disease, or unspecified chronic kidney disease: Secondary | ICD-10-CM | POA: Diagnosis not present

## 2019-11-16 DIAGNOSIS — I25118 Atherosclerotic heart disease of native coronary artery with other forms of angina pectoris: Secondary | ICD-10-CM | POA: Diagnosis not present

## 2019-11-16 DIAGNOSIS — I214 Non-ST elevation (NSTEMI) myocardial infarction: Secondary | ICD-10-CM | POA: Diagnosis not present

## 2019-11-16 DIAGNOSIS — I208 Other forms of angina pectoris: Secondary | ICD-10-CM | POA: Diagnosis not present

## 2019-11-16 DIAGNOSIS — E782 Mixed hyperlipidemia: Secondary | ICD-10-CM | POA: Diagnosis not present

## 2019-12-22 ENCOUNTER — Telehealth: Payer: Self-pay | Admitting: General Practice

## 2019-12-22 ENCOUNTER — Ambulatory Visit (INDEPENDENT_AMBULATORY_CARE_PROVIDER_SITE_OTHER): Payer: Medicare Other | Admitting: General Practice

## 2019-12-22 DIAGNOSIS — E782 Mixed hyperlipidemia: Secondary | ICD-10-CM | POA: Diagnosis not present

## 2019-12-22 DIAGNOSIS — I25119 Atherosclerotic heart disease of native coronary artery with unspecified angina pectoris: Secondary | ICD-10-CM

## 2019-12-22 DIAGNOSIS — F339 Major depressive disorder, recurrent, unspecified: Secondary | ICD-10-CM | POA: Diagnosis not present

## 2019-12-22 NOTE — Patient Instructions (Signed)
Visit Information  Goals Addressed            This Visit's Progress   . RNCM: Chronic disease management and support       Current Barriers:  . Chronic Disease Management support, education, and care coordination needs related to CAD, HTN, HLD, and Aortic Atherosclerosis  . Clinical Goal(s) related to : CAD, HTN, HLD, and Aortic Atherosclerosis:  Over the next 120 days, patient will:  . Work with the care management team to address educational, disease management, and care coordination needs  . Begin or continue self health monitoring activities as directed today Measure and record blood pressure at least 2  times per week and discuss with pcp if she needs to take her BP daily and record, follow a heart healthy diet . Call provider office for new or worsened signs and symptoms Blood pressure findings outside established parameters and New or worsened symptom related to numbness and loss of feeling in the tips of her fingers on each hand (new concern for the patient) . Call care management team with questions or concerns . Verbalize basic understanding of patient centered plan of care established today  . Interventions related to CAD, HTN, HLD, and Aortic Atherosclerosis:  . Evaluation of current treatment plans and patient's adherence to plan as established by provider: The patient saw cardiologist recently and her Imdur was increased to 60 mg  . Assessed patient understanding of disease states: The patient still is having numbness in her hands and fingertips- the patient feels she needs to see a neurologist.  Will collaborate with pcp to see if a neurologist referral is appropriate or what other recommendations the pcp may have for the patient . Assessed patient's education and care coordination needs: The patient is working out in her yard sometimes 6 to 8 hours. 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 Provided disease specific education to patient: Patient does not currently have a blood pressure cuff, she let her daughter have hers. She has the OTC product book and will be able to order a new one on April 1st.  Education and support given to the patient on benefits of checking blood pressure and recording, especially due to the increase in Imdur dosage.  Reviewed with the patient the s/s of orthostatic hypotension and changing position slowly.  Nash Dimmer with appropriate clinical care team members regarding patient needs  . Patient Self Care Activities related to CAD, HTN, HLD, and Aortic Atherosclerosis: . Patient is unable to independently self-manage chronic health conditions  Please see past updates related to this goal by clicking on the "Past Updates" button in the selected goal         Patient verbalizes understanding of instructions provided today.   The care management team will reach out to the patient again over the next 60 days.   Noreene Larsson RN, MSN, [facility] Mobile: [phone]

## 2019-12-22 NOTE — Chronic Care Management (AMB) (Signed)
Chronic Care Management   Follow Up Note   12/22/2019 Name: Tonya Cabrera MRN: [id number] DOB: July 10, 1951  Referred by: Valerie Roys, DO Reason for referral : Chronic Care Management (Follow up: diet/exercise/HLD/CAD)   Tonya Cabrera is a 69 y.o. year old female who is a primary care patient of Valerie Roys, DO. The CCM team was consulted for assistance with chronic disease management and care coordination needs.    Review of patient status, including review of consultants reports, relevant laboratory and other test results, and collaboration with appropriate care team members and the patient's provider was performed as part of comprehensive patient evaluation and provision of chronic care management services.    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 alendronate (FOSAMAX) 70 MG tablet Take with a full glass of water on an empty stomach.  . isosorbide mononitrate (IMDUR) 30 MG 24 hr tablet Take 60 mg by mouth daily.   Marland Kitchen lisinopril (ZESTRIL) 10 MG tablet Take 1 tablet (10 mg total) by mouth daily.  . metoprolol succinate (TOPROL-XL) 25 MG 24 hr tablet Take 1 tablet (25 mg total) by mouth 2 (two) times daily.  . Vitamin D, Ergocalciferol, (DRISDOL) 1.25 MG (50000 UNIT) CAPS capsule Take 1 capsule (50,000 Units total) by mouth every 7 (seven) days.  Marland Kitchen aspirin 81 MG EC tablet TAKE 1 TABLET (81 MG TOTAL) BY MOUTH DAILY.  Marland Kitchen atorvastatin (LIPITOR) 40 MG tablet Take 1 tablet (40 mg total) by mouth daily.  . ciprofloxacin (CIPRO) 500 MG tablet Take 1 tablet (500 mg total) by mouth  2 (two) times daily.  . nitrofurantoin, macrocrystal-monohydrate, (MACROBID) 100 MG capsule Take 1 capsule (100 mg total) by mouth 2 (two) times daily.  . solifenacin (VESICARE) 5 MG tablet TAKE 1 TABLET BY MOUTH EVERY DAY   No facility-administered encounter medications on file as of 12/22/2019.     Objective:  BP Readings from Last 3 Encounters:  07/31/19 (!) 156/78  06/19/19 136/72  05/02/19 128/76    Goals Addressed            This Visit's Progress   . RNCM: Chronic disease management and support       Current Barriers:  . Chronic Disease Management support, education, and care coordination needs related to CAD, HTN, HLD, and Aortic Atherosclerosis  . Clinical Goal(s) related to : CAD, HTN, HLD, and Aortic Atherosclerosis:  Over the next 120 days, patient will:  . Work with the care management team to address educational, disease management, and care coordination needs  . Begin or continue self health monitoring activities as directed today Measure and record blood pressure at least 2  times per week and discuss with pcp if she needs to take her BP daily and record, follow a heart healthy diet . Call provider office for new or worsened signs and symptoms Blood pressure findings outside established parameters and New or worsened symptom related to numbness and loss of feeling in the tips of her fingers on each hand (new concern for the patient) . Call care management team with questions or concerns . Verbalize basic  understanding of patient centered plan of care established today  . Interventions related to CAD, HTN, HLD, and Aortic Atherosclerosis:  . Evaluation of current treatment plans and patient's adherence to plan as established by provider: The patient saw cardiologist recently and her Imdur was increased to 60 mg  . Assessed patient understanding of disease states: The patient still is having numbness in her hands and fingertips- the patient feels she needs to see a  neurologist.  Will collaborate with pcp to see if a neurologist referral is appropriate or what other recommendations the pcp may have for the patient . Assessed patient's education and care coordination needs: The patient is working out in her yard sometimes 6 to 8 hours. 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 Provided disease specific education to patient: Patient does not currently have a blood pressure cuff, she let her daughter have hers. She has the OTC product book and will be able to order a new one on April 1st.  Education and support given to the patient on benefits of checking blood pressure and recording, especially due to the increase in Imdur dosage.  Reviewed with the patient the s/s of orthostatic hypotension and changing position slowly.  Nash Dimmer with appropriate clinical care team members regarding patient needs  . Patient Self Care Activities related to CAD, HTN, HLD, and Aortic Atherosclerosis: . Patient is unable to independently self-manage chronic health conditions  Please see past updates related to this goal by clicking on the "Past Updates" button in the selected goal          Plan:   The care management team will reach out to the patient again over the next 60 days.    Noreene Larsson RN, MSN, [facility] Mobile: [phone]

## 2020-01-09 ENCOUNTER — Other Ambulatory Visit: Payer: Self-pay | Admitting: Family Medicine

## 2020-01-09 ENCOUNTER — Other Ambulatory Visit: Payer: Self-pay | Admitting: Urology

## 2020-01-10 NOTE — Telephone Encounter (Signed)
Routing to provider  

## 2020-01-23 DIAGNOSIS — H26491 Other secondary cataract, right eye: Secondary | ICD-10-CM | POA: Diagnosis not present

## 2020-02-14 ENCOUNTER — Telehealth: Payer: Self-pay

## 2020-02-14 ENCOUNTER — Ambulatory Visit: Payer: Self-pay | Admitting: General Practice

## 2020-02-14 NOTE — Chronic Care Management (AMB) (Signed)
  Chronic Care Management   Outreach Note  02/14/2020 Name: Tonya Cabrera MRN: [id number] DOB: [birth year]  Referred by: Valerie Roys, DO Reason for referral : Chronic Care Management (Follow up on Chronic Disease Management and Care Coordination Needs)   An unsuccessful telephone outreach was attempted today. The patient was referred to the case management team for assistance with care management and care coordination.   Follow Up Plan: A HIPPA compliant phone message was left for the patient providing contact information and requesting a return call.   Noreene Larsson RN, MSN, [facility] Mobile: [phone]

## 2020-02-21 DIAGNOSIS — H26491 Other secondary cataract, right eye: Secondary | ICD-10-CM | POA: Diagnosis not present

## 2020-04-02 ENCOUNTER — Ambulatory Visit: Payer: Self-pay | Admitting: General Practice

## 2020-04-02 ENCOUNTER — Telehealth: Payer: Self-pay

## 2020-04-02 NOTE — Chronic Care Management (AMB) (Signed)
  Chronic Care Management   Outreach Note  04/02/2020 Name: MALENY CANDY MRN: [id number] DOB: [birth year]  Referred by: Valerie Roys, DO Reason for referral : Chronic Care Management (2nd attempt: Follow up RNCM Chronic Disease Mangement and Care Coordination needs )   A second unsuccessful telephone outreach was attempted today. The patient was referred to the case management team for assistance with care management and care coordination.   Follow Up Plan: A HIPPA compliant phone message was left for the patient providing contact information and requesting a return call.   Noreene Larsson RN, MSN, [facility] Mobile: [phone]

## 2020-04-08 ENCOUNTER — Other Ambulatory Visit: Payer: Self-pay | Admitting: Family Medicine

## 2020-04-08 DIAGNOSIS — Z1231 Encounter for screening mammogram for malignant neoplasm of breast: Secondary | ICD-10-CM

## 2020-04-18 ENCOUNTER — Ambulatory Visit
Admission: RE | Admit: 2020-04-18 | Discharge: 2020-04-18 | Disposition: A | Discharge status: Home / Self Care | Payer: Medicare Other | Source: Ambulatory Visit | Attending: Family Medicine | Admitting: Family Medicine

## 2020-04-18 DIAGNOSIS — Z1231 Encounter for screening mammogram for malignant neoplasm of breast: Secondary | ICD-10-CM | POA: Diagnosis not present

## 2020-04-23 ENCOUNTER — Encounter: Payer: Self-pay | Admitting: Family Medicine

## 2020-04-23 ENCOUNTER — Other Ambulatory Visit: Payer: Self-pay | Admitting: Family Medicine

## 2020-04-23 NOTE — Telephone Encounter (Signed)
Requested Prescriptions  Pending Prescriptions Disp Refills  . lisinopril (ZESTRIL) 10 MG tablet [Pharmacy Med Name: LISINOPRIL 10 MG TABLET] 90 tablet 0    Sig: TAKE 1 TABLET BY MOUTH EVERY DAY     Cardiovascular:  ACE Inhibitors Failed - 04/23/2020  1:24 AM      Failed - Last BP in normal range    BP Readings from Last 1 Encounters:  07/31/19 (!) 156/78         Passed - Cr in normal range and within 180 days    Creatinine  Date Value Ref Range Status  05/16/2014 0.90 0.60 - 1.30 mg/dL Final   Creatinine, Ser  Date Value Ref Range Status  11/03/2019 1.00 0.57 - 1.00 mg/dL Final         Passed - K in normal range and within 180 days    Potassium  Date Value Ref Range Status  11/03/2019 4.6 3.5 - 5.2 mmol/L Final  05/16/2014 3.9 3.5 - 5.1 mmol/L Final         Passed - Patient is not pregnant      Passed - Valid encounter within last 6 months    Recent Outpatient Visits          5 months ago [place], Megan P, DO   11 months ago Encounter for [employer] annual wellness exam   [place], DO   1 year ago Diarrhea, unspecified type   [place], [place], DO   1 year ago Diarrhea, unspecified type   [place], [place], DO   1 year ago Essential hypertension   [place], [place], DO      Future Appointments            In 2 weeks Wynetta Emery, Barb Merino, DO [employer], [facility]

## 2020-04-25 ENCOUNTER — Other Ambulatory Visit: Payer: Self-pay | Admitting: Family Medicine

## 2020-05-07 ENCOUNTER — Telehealth: Payer: Self-pay | Admitting: General Practice

## 2020-05-07 ENCOUNTER — Ambulatory Visit (INDEPENDENT_AMBULATORY_CARE_PROVIDER_SITE_OTHER): Payer: Medicare Other | Admitting: General Practice

## 2020-05-07 DIAGNOSIS — I10 Essential (primary) hypertension: Secondary | ICD-10-CM

## 2020-05-07 DIAGNOSIS — F339 Major depressive disorder, recurrent, unspecified: Secondary | ICD-10-CM

## 2020-05-07 DIAGNOSIS — I25119 Atherosclerotic heart disease of native coronary artery with unspecified angina pectoris: Secondary | ICD-10-CM

## 2020-05-07 DIAGNOSIS — E782 Mixed hyperlipidemia: Secondary | ICD-10-CM

## 2020-05-07 NOTE — Chronic Care Management (AMB) (Signed)
Chronic Care Management   Follow Up Note   05/07/2020 Name: Tonya Cabrera MRN: [id number] DOB: 09/25/1951  Referred by: Valerie Roys, DO Reason for referral : No chief complaint on file.   Tonya Cabrera is a 69 y.o. year old female who is a primary care patient of Valerie Roys, DO. The CCM team was consulted for assistance with chronic disease management and care coordination needs.    Review of patient status, including review of consultants reports, relevant laboratory and other test results, and collaboration with appropriate care team members and the patient's provider was performed as part of comprehensive patient evaluation and provision of chronic care management services.    SDOH (Social Determinants of Health) assessments performed: Yes See Care Plan activities for detailed interventions related to [facility])     Outpatient Encounter Medications as of 05/07/2020  Medication Sig   alendronate (FOSAMAX) 70 MG tablet Take with a full glass of water on an empty stomach.   aspirin 81 MG EC tablet TAKE 1 TABLET (81 MG TOTAL) BY MOUTH DAILY.   atorvastatin (LIPITOR) 40 MG tablet TAKE 1 TABLET BY MOUTH EVERY DAY   ciprofloxacin (CIPRO) 500 MG tablet Take 1 tablet (500 mg total) by mouth 2 (two) times daily.   isosorbide mononitrate (IMDUR) 30 MG 24 hr tablet Take 60 mg by mouth daily.    lisinopril (ZESTRIL) 10 MG tablet TAKE 1 TABLET BY MOUTH EVERY DAY   metoprolol succinate (TOPROL-XL) 25 MG 24 hr tablet TAKE 1 TABLET BY MOUTH TWICE A DAY   nitrofurantoin, macrocrystal-monohydrate, (MACROBID) 100 MG capsule Take 1 capsule (100 mg total) by mouth 2 (two) times daily.   solifenacin (VESICARE) 5 MG tablet TAKE 1 TABLET BY MOUTH EVERY DAY   Vitamin D, Ergocalciferol, (DRISDOL) 1.25 MG (50000 UNIT) CAPS capsule TAKE 1 CAPSULE (50,000 UNITS TOTAL) BY MOUTH EVERY 7 (SEVEN) DAYS.   No facility-administered encounter medications on file as of 05/07/2020.     Objective:  BP  Readings from Last 3 Encounters:  07/31/19 (!) 156/78  06/19/19 136/72  05/02/19 128/76    Goals Addressed            This Visit's Progress    RNCM: Chronic disease management and support       Current Barriers:   Chronic Disease Management support, education, and care coordination needs related to CAD, HTN, HLD, and Aortic Atherosclerosis   Clinical Goal(s) related to : CAD, HTN, HLD, and Aortic Atherosclerosis:  Over the next 120 days, patient will:   Work with the care management team to address educational, disease management, and care coordination needs   Begin or continue self health monitoring activities as directed today Measure and record blood pressure at least 2  times per week and discuss with pcp if she needs to take her BP daily and record, follow a heart healthy diet  Call provider office for new or worsened signs and symptoms Blood pressure findings outside established parameters and New or worsened symptom related to numbness and loss of feeling in the tips of her fingers on each hand (new concern for the patient)  Call care management team with questions or concerns  Verbalize basic understanding of patient centered plan of care established today   Interventions related to CAD, HTN, HLD, and Aortic Atherosclerosis:   Evaluation of current treatment plans and patient's adherence to plan as established by provider: The patient saw cardiologist recently and her Imdur was increased to 60 mg  Assessed patient understanding of disease states: The patient still is having numbness in her hands and fingertips- the patient feels she needs to see a neurologist.  Will collaborate with pcp to see if a neurologist referral is appropriate or what other recommendations the pcp may have for the patient. 05-07-2020: Denies any issues related to numbness in hands at this time. Is gardening and working outside a lot.   Assessed patient's education and care coordination needs:  The patient is working out in her yard sometimes 6 to 8 hours. 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 She has the OTC product book and will be able to order a new one on April 1st.  Education and support given to the patient on benefits of checking blood pressure and recording, especially due to the increase in Imdur dosage.  Reviewed with the patient the s/s of orthostatic hypotension and changing position slowly. 05-07-2020: She has a functioning blood pressure cuff now. Has not taken her blood pressure in a while but feels it is up due to her face being red. Ask the patient to take pressures and write them down to have on hand at MD visit on 05-10-2020  Collaborated with appropriate clinical care team members regarding patient needs.  No needs at this time from the CCM team LCSW and pharmacist.   Patient Self Care Activities related to CAD, HTN, HLD, and Aortic Atherosclerosis:  Patient is unable to independently self-manage chronic health conditions  Please see past updates related to this goal by clicking on the "Past Updates" button in the selected goal       RNCM: I really just eat what I want to       Current Barriers:   Knowledge Deficits related to adherence to a low sodium heart healthy diet  Nurse Case Manager Clinical Goal(s):   Over the next 120 days, patient will verbalize understanding of plan for watching sodium and fat content in her diet   Over the next 120 days, patient will attend all scheduled medical appointments: Next appointment with pcp on 05-10-2020  Over the next 120 days, patient will demonstrate improved health management independence as evidenced bymonitoring blood pressures in the home environment, maintaining a heart  healthy diet, and increasing activity  Over the next 120 days, patient will verbalize basic understanding of CAD/HLD/HTN/ And Aortic Atherosclerosis disease process and self health management plan as evidenced by starting healthy life style changes to promote health and well being  Over the next 120 days, patient will demonstrate understanding of rationale for each prescribed medication as evidenced by compliance and asking questions for any concerns she has with medications  Over the next 120 days, the patient will demonstrate ongoing self health care management ability as evidenced by improved dietary restrictions, increased physical activity, BP monitoring in the home environment as directed by pcp  Interventions:   Evaluation of current treatment plan related to chronic disease processes and patient's adherence to plan as established by provider.  Provided education to patient re: benefits of a low sodium, heart healthy diet. 05-07-2020: the patient is working on a Heart healthy diet. Admits she is not always compliant and since she quit smoking she does eat a lot.  Reviewed medications with patient and discussed cost constraints and compliance (no issues noted with medications)  Discussed plans with patient for ongoing care management follow up and provided patient with direct contact information for care management team  Advised patient, providing education and rationale, to monitor blood pressure daily and record, calling pcp for findings outside established parameters. 05-07-2020: the patient feels her blood pressure is elevated because she has noticed her face being "red". The patient was not at home and did not have readings available. Education on taking blood pressures and recording regularly for the next MD and RNCM visit.   Reviewed scheduled/upcoming provider appointments including: next appointment with pcp on 05-10-2020  Patient Self Care Activities:   Patient verbalizes  understanding of plan to follow a low sodium, heart healthy diet  Attends all scheduled provider appointments  Unable to independently manage chronic health conditions  Please see past updates related to this goal by clicking on the "Past Updates" button in the selected goal          Plan:   Telephone follow up appointment with care management team member scheduled for: 07-09-2020 at 0900 am   [place], MSN, [facility] Mobile: [phone]

## 2020-05-07 NOTE — Patient Instructions (Signed)
Visit Information  Goals Addressed            This Visit's Progress    RNCM: Chronic disease management and support       Current Barriers:   Chronic Disease Management support, education, and care coordination needs related to CAD, HTN, HLD, and Aortic Atherosclerosis   Clinical Goal(s) related to : CAD, HTN, HLD, and Aortic Atherosclerosis:  Over the next 120 days, patient will:   Work with the care management team to address educational, disease management, and care coordination needs   Begin or continue self health monitoring activities as directed today Measure and record blood pressure at least 2  times per week and discuss with pcp if she needs to take her BP daily and record, follow a heart healthy diet  Call provider office for new or worsened signs and symptoms Blood pressure findings outside established parameters and New or worsened symptom related to numbness and loss of feeling in the tips of her fingers on each hand (new concern for the patient)  Call care management team with questions or concerns  Verbalize basic understanding of patient centered plan of care established today   Interventions related to CAD, HTN, HLD, and Aortic Atherosclerosis:   Evaluation of current treatment plans and patient's adherence to plan as established by provider: The patient saw cardiologist recently and her Imdur was increased to 60 mg   Assessed patient understanding of disease states: The patient still is having numbness in her hands and fingertips- the patient feels she needs to see a neurologist.  Will collaborate with pcp to see if a neurologist referral is appropriate or what other recommendations the pcp may have for the patient. 05-07-2020: Denies any issues related to numbness in hands at this time. Is gardening and working outside a lot.   Assessed patient's education and care coordination needs: The patient is working out in her yard sometimes 6 to 8 hours. 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 She has the OTC product book and will be able to order a new one on April 1st.  Education and support given to the patient on benefits of checking blood pressure and recording, especially due to the increase in Imdur dosage.  Reviewed with the patient the s/s of orthostatic hypotension and changing position slowly. 05-07-2020: She has a functioning blood pressure cuff now. Has not taken her blood pressure in a while but feels it is up due to her face being red. Ask the patient to take pressures and write them down to have on hand at MD visit on 05-10-2020  Collaborated with appropriate clinical care team members regarding patient needs.  No needs at this time from the CCM team LCSW and pharmacist.   Patient Self Care Activities related to CAD, HTN, HLD, and Aortic Atherosclerosis:  Patient is unable to independently self-manage chronic health conditions  Please see past updates related to this goal by clicking on the "Past Updates" button in the selected goal       RNCM: I really just eat what I want to       Current Barriers:   Knowledge Deficits related to adherence to a low sodium heart healthy diet  Nurse Case Manager  Clinical Goal(s):   Over the next 120 days, patient will verbalize understanding of plan for watching sodium and fat content in her diet   Over the next 120 days, patient will attend all scheduled medical appointments: Next appointment with pcp on 05-10-2020  Over the next 120 days, patient will demonstrate improved health management independence as evidenced bymonitoring blood pressures in the home environment, maintaining a heart healthy diet, and increasing activity  Over the next 120 days, patient will  verbalize basic understanding of CAD/HLD/HTN/ And Aortic Atherosclerosis disease process and self health management plan as evidenced by starting healthy life style changes to promote health and well being  Over the next 120 days, patient will demonstrate understanding of rationale for each prescribed medication as evidenced by compliance and asking questions for any concerns she has with medications  Over the next 120 days, the patient will demonstrate ongoing self health care management ability as evidenced by improved dietary restrictions, increased physical activity, BP monitoring in the home environment as directed by pcp  Interventions:   Evaluation of current treatment plan related to chronic disease processes and patient's adherence to plan as established by provider.  Provided education to patient re: benefits of a low sodium, heart healthy diet. 05-07-2020: the patient is working on a Heart healthy diet. Admits she is not always compliant and since she quit smoking she does eat a lot.   Reviewed medications with patient and discussed cost constraints and compliance (no issues noted with medications)  Discussed plans with patient for ongoing care management follow up and provided patient with direct contact information for care management team  Advised patient, providing education and rationale, to monitor blood pressure daily and record, calling pcp for findings outside established parameters. 05-07-2020: the patient feels her blood pressure is elevated because she has noticed her face being "red". The patient was not at home and did not have readings available. Education on taking blood pressures and recording regularly for the next MD and RNCM visit.   Reviewed scheduled/upcoming provider appointments including: next appointment with pcp on 05-10-2020  Patient Self Care Activities:   Patient verbalizes understanding of plan to follow a low sodium, heart healthy diet  Attends all  scheduled provider appointments  Unable to independently manage chronic health conditions  Please see past updates related to this goal by clicking on the "Past Updates" button in the selected goal         Patient verbalizes understanding of instructions provided today.   Telephone follow up appointment with care management team member scheduled for: 07-09-2020 0900 am  Noreene Larsson RN, MSN, [facility] Mobile: [phone]

## 2020-05-10 ENCOUNTER — Ambulatory Visit (INDEPENDENT_AMBULATORY_CARE_PROVIDER_SITE_OTHER): Payer: Medicare Other | Admitting: Family Medicine

## 2020-05-10 ENCOUNTER — Encounter: Payer: Self-pay | Admitting: Family Medicine

## 2020-05-10 ENCOUNTER — Other Ambulatory Visit: Payer: Self-pay

## 2020-05-10 VITALS — BP 120/68 | HR 59 | Temp 98.4°F | Ht 62.99 in | Wt 140.2 lb

## 2020-05-10 DIAGNOSIS — D692 Other nonthrombocytopenic purpura: Secondary | ICD-10-CM

## 2020-05-10 DIAGNOSIS — Z0000 Encounter for general adult medical examination without abnormal findings: Secondary | ICD-10-CM

## 2020-05-10 DIAGNOSIS — I25119 Atherosclerotic heart disease of native coronary artery with unspecified angina pectoris: Secondary | ICD-10-CM

## 2020-05-10 DIAGNOSIS — I129 Hypertensive chronic kidney disease with stage 1 through stage 4 chronic kidney disease, or unspecified chronic kidney disease: Secondary | ICD-10-CM | POA: Diagnosis not present

## 2020-05-10 DIAGNOSIS — M810 Age-related osteoporosis without current pathological fracture: Secondary | ICD-10-CM | POA: Diagnosis not present

## 2020-05-10 DIAGNOSIS — E782 Mixed hyperlipidemia: Secondary | ICD-10-CM

## 2020-05-10 DIAGNOSIS — N3281 Overactive bladder: Secondary | ICD-10-CM | POA: Diagnosis not present

## 2020-05-10 DIAGNOSIS — I700 Atherosclerosis of aorta: Secondary | ICD-10-CM | POA: Diagnosis not present

## 2020-05-10 DIAGNOSIS — F3340 Major depressive disorder, recurrent, in remission, unspecified: Secondary | ICD-10-CM

## 2020-05-10 LAB — URINALYSIS, ROUTINE W REFLEX MICROSCOPIC
Bilirubin, UA: NEGATIVE
Glucose, UA: NEGATIVE
Ketones, UA: NEGATIVE
Nitrite, UA: NEGATIVE
Protein,UA: NEGATIVE
Specific Gravity, UA: <1.005 — ABNORMAL LOW (ref 1.005–1.030)
Urobilinogen, Ur: 0.2 mg/dL (ref 0.2–1.0)
pH, UA: 6 (ref 5.0–7.5)

## 2020-05-10 LAB — MICROALBUMIN, URINE WAIVED
Creatinine, Urine Waived: 10 mg/dL (ref 10–300)
Microalb, Ur Waived: 10 mg/L (ref 0–19)
Microalb/Creat Ratio: <30 mg/g (ref <30)

## 2020-05-10 LAB — MICROSCOPIC EXAMINATION: Bacteria, UA: NONE SEEN

## 2020-05-10 MED ORDER — METOPROLOL SUCCINATE ER 25 MG PO TB24: 25.0000 mg | ORAL_TABLET | Freq: Two times a day (BID) | ORAL | 1 refills | Status: DC

## 2020-05-10 MED ORDER — ATORVASTATIN CALCIUM 40 MG PO TABS: 40.0000 mg | ORAL_TABLET | Freq: Every day | ORAL | 1 refills | Status: DC

## 2020-05-10 MED ORDER — ALENDRONATE SODIUM 70 MG PO TABS: ORAL_TABLET | ORAL | 3 refills | Status: DC

## 2020-05-10 MED ORDER — ASPIRIN 81 MG PO TBEC: DELAYED_RELEASE_TABLET | ORAL | 4 refills | Status: DC

## 2020-05-10 MED ORDER — LISINOPRIL 10 MG PO TABS: 10.0000 mg | ORAL_TABLET | Freq: Every day | ORAL | 1 refills | Status: DC

## 2020-05-10 NOTE — Patient Instructions (Addendum)
Call to schedule your bone density: [facility] at [facility]  Address: [address], [place], [place] [address]  Phone: [phone]  Preventative Services:  Health Risk Assessment and Personalized Prevention Plan: Done today Bone Mass Measurements: Ordered today Breast Cancer Screening: Up to date CVD Screening: Done today Cervical Cancer Screening: N/A Colon Cancer Screening: Up to date Depression Screening: Done today Diabetes Screening: Done today Glaucoma Screening: See your eye doctor Hepatitis B vaccine: N/A Hepatitis C screening: Up to date HIV Screening: Up to date Flu Vaccine: Get in the fall Lung cancer Screening: N/A Obesity Screening: Done today Pneumonia Vaccines (2): Declined STI Screening: N/A   Health Maintenance After Age 27 After age 16, you are at a higher risk for certain long-term diseases and infections as well as injuries from falls. Falls are a major cause of broken bones and head injuries in people who are older than age 62. Getting regular preventive care can help to keep you healthy and well. Preventive care includes getting regular testing and making lifestyle changes as recommended by your health care provider. Talk with your health care provider about:  Which screenings and tests you should have. A screening is a test that checks for a disease when you have no symptoms.  A diet and exercise plan that is right for you. What should I know about screenings and tests to prevent falls? Screening and testing are the best ways to find a health problem early. Early diagnosis and treatment give you the best chance of managing medical conditions that are common after age 75. Certain conditions and lifestyle choices may make you more likely to have a fall. Your health care provider may recommend:  Regular vision checks. Poor vision and conditions such as cataracts can make you more likely to have a fall. If you wear glasses, make sure  to get your prescription updated if your vision changes.  Medicine review. Work with your health care provider to regularly review all of the medicines you are taking, including over-the-counter medicines. Ask your health care provider about any side effects that may make you more likely to have a fall. Tell your health care provider if any medicines that you take make you feel dizzy or sleepy.  Osteoporosis screening. Osteoporosis is a condition that causes the bones to get weaker. This can make the bones weak and cause them to break more easily.  Blood pressure screening. Blood pressure changes and medicines to control blood pressure can make you feel dizzy.  Strength and balance checks. Your health care provider may recommend certain tests to check your strength and balance while standing, walking, or changing positions.  Foot health exam. Foot pain and numbness, as well as not wearing proper footwear, can make you more likely to have a fall.  Depression screening. You may be more likely to have a fall if you have a fear of falling, feel emotionally low, or feel unable to do activities that you used to do.  Alcohol use screening. Using too much alcohol can affect your balance and may make you more likely to have a fall. What actions can I take to lower my risk of falls? General instructions  Talk with your health care provider about your risks for falling. Tell your health care provider if: ? You fall. Be sure to tell your health care provider about all falls, even ones that seem minor. ? You feel dizzy, sleepy, or off-balance.  Take over-the-counter and prescription medicines  only as told by your health care provider. These include any supplements.  Eat a healthy diet and maintain a healthy weight. A healthy diet includes low-fat dairy products, low-fat (lean) meats, and fiber from whole grains, beans, and lots of fruits and vegetables. Home safety  Remove any tripping hazards, such as  rugs, cords, and clutter.  Install safety equipment such as grab bars in bathrooms and safety rails on stairs.  Keep rooms and walkways well-lit. Activity   Follow a regular exercise program to stay fit. This will help you maintain your balance. Ask your health care provider what types of exercise are appropriate for you.  If you need a cane or Fazzini, use it as recommended by your health care provider.  Wear supportive shoes that have nonskid soles. Lifestyle  Do not drink alcohol if your health care provider tells you not to drink.  If you drink alcohol, limit how much you have: ? 0-1 drink a day for women. ? 0-2 drinks a day for men.  Be aware of how much alcohol is in your drink. In the [place] one drink equals one typical bottle of beer (12 oz), one-half glass of wine (5 oz), or one shot of hard liquor (1 oz).  Do not use any products that contain nicotine or tobacco, such as cigarettes and e-cigarettes. If you need help quitting, ask your health care provider. Summary  Having a healthy lifestyle and getting preventive care can help to protect your health and wellness after age 61.  Screening and testing are the best way to find a health problem early and help you avoid having a fall. Early diagnosis and treatment give you the best chance for managing medical conditions that are more common for people who are older than age 46.  Falls are a major cause of broken bones and head injuries in people who are older than age 1. Take precautions to prevent a fall at home.  Work with your health care provider to learn what changes you can make to improve your health and wellness and to prevent falls. This information is not intended to replace advice given to you by your health care provider. Make sure you discuss any questions you have with your health care provider. Document Revised: 01/05/2019 Document Reviewed: 07/28/2017 Elsevier Patient Education  2020 [employer].

## 2020-05-11 LAB — CBC WITH DIFFERENTIAL/PLATELET
Basophils Absolute: 0 10*3/uL (ref 0.0–0.2)
Basos: 0 %
EOS (ABSOLUTE): 0.2 10*3/uL (ref 0.0–0.4)
Eos: 2 %
Hematocrit: 36 % (ref 34.0–46.6)
Hemoglobin: 12.1 g/dL (ref 11.1–15.9)
Immature Grans (Abs): 0 10*3/uL (ref 0.0–0.1)
Immature Granulocytes: 0 %
Lymphocytes Absolute: 1.9 10*3/uL (ref 0.7–3.1)
Lymphs: 25 %
MCH: 31.1 pg (ref 26.6–33.0)
MCHC: 33.6 g/dL (ref 31.5–35.7)
MCV: 93 fL (ref 79–97)
Monocytes Absolute: 0.6 10*3/uL (ref 0.1–0.9)
Monocytes: 7 %
Neutrophils Absolute: 5 10*3/uL (ref 1.4–7.0)
Neutrophils: 66 %
Platelets: 227 10*3/uL (ref 150–450)
RBC: 3.89 x10E6/uL (ref 3.77–5.28)
RDW: 12.8 % (ref 11.7–15.4)
WBC: 7.7 10*3/uL (ref 3.4–10.8)

## 2020-05-11 LAB — LIPID PANEL W/O CHOL/HDL RATIO
Cholesterol, Total: 167 mg/dL (ref 100–199)
HDL: 49 mg/dL (ref >39)
LDL Chol Calc (NIH): 92 mg/dL (ref 0–99)
Triglycerides: 149 mg/dL (ref 0–149)
VLDL Cholesterol Cal: 26 mg/dL (ref 5–40)

## 2020-05-11 LAB — COMPREHENSIVE METABOLIC PANEL
ALT: 20 IU/L (ref 0–32)
AST: 22 IU/L (ref 0–40)
Albumin/Globulin Ratio: 2.2 (ref 1.2–2.2)
Albumin: 4.6 g/dL (ref 3.8–4.8)
Alkaline Phosphatase: 91 IU/L (ref 48–121)
BUN/Creatinine Ratio: 15 (ref 12–28)
BUN: 17 mg/dL (ref 8–27)
Bilirubin Total: 1.9 mg/dL — ABNORMAL HIGH (ref 0.0–1.2)
CO2: 22 mmol/L (ref 20–29)
Calcium: 9.7 mg/dL (ref 8.7–10.3)
Chloride: 105 mmol/L (ref 96–106)
Creatinine, Ser: 1.12 mg/dL — ABNORMAL HIGH (ref 0.57–1.00)
GFR calc Af Amer: 58 mL/min/{1.73_m2} — ABNORMAL LOW (ref >59)
GFR calc non Af Amer: 50 mL/min/{1.73_m2} — ABNORMAL LOW (ref >59)
Globulin, Total: 2.1 g/dL (ref 1.5–4.5)
Glucose: 93 mg/dL (ref 65–99)
Potassium: 5.1 mmol/L (ref 3.5–5.2)
Sodium: 140 mmol/L (ref 134–144)
Total Protein: 6.7 g/dL (ref 6.0–8.5)

## 2020-05-11 LAB — TSH: TSH: 2.46 u[IU]/mL (ref 0.450–4.500)

## 2020-05-11 NOTE — Progress Notes (Signed)
BP 120/68 (BP Location: Left Arm, Patient Position: Sitting, Cuff Size: Normal)   Pulse (!) 59   Temp 98.4 F (36.9 C) (Oral)   Ht 5' 2.99" (1.6 m)   Wt 140 lb 3.2 oz (63.6 kg)   SpO2 100%   BMI 24.84 kg/m    Subjective:    Patient ID: Tonya Cabrera, female    DOB: [birth year], 69 y.o.   MRN: [id number]  HPI: Tonya Cabrera is a 69 y.o. female presenting on 05/10/2020 for comprehensive medical examination. Current medical complaints include:  HYPERTENSION / HYPERLIPIDEMIA Satisfied with current treatment? yes Duration of hypertension: chronic BP monitoring frequency: not checking BP medication side effects: yes Past BP meds: imdur, metoprolol, lisinopril Duration of hyperlipidemia: chronic Cholesterol medication side effects: no Cholesterol supplements: none Past cholesterol medications: atorvastatin Medication compliance: excellent compliance Aspirin: yes Recent stressors: yes Recurrent headaches: no Visual changes: no Palpitations: no Dyspnea: no Chest pain: no Lower extremity edema: no Dizzy/lightheaded: no  DEPRESSION Mood status: stable Satisfied with current treatment?: yes Symptom severity: mild  Duration of current treatment : not on anything Depressed mood: no Anxious mood: yes Anhedonia: no Significant weight loss or gain: no Insomnia: no  Fatigue: no Feelings of worthlessness or guilt: no Impaired concentration/indecisiveness: no Suicidal ideations: no Hopelessness: no Crying spells: no Depression screen [facility] 2/9 11/02/2019 10/27/2019 05/02/2019 01/03/2019 07/08/2018  Decreased Interest 0 0 0 0 0  Down, Depressed, Hopeless 0 0 0 0 0  PHQ - 2 Score 0 0 0 0 0  Altered sleeping 3 - 2 3 3   Tired, decreased energy 0 - 0 0 1  Change in appetite 0 - 0 0 0  Feeling bad or failure about yourself  0 - 0 0 0  Trouble concentrating 0 - 2 0 3  Moving slowly or fidgety/restless 0 - 0 1 0  Suicidal thoughts 0 - 0 0 0  PHQ-9 Score 3 - 4 4 7   Difficult doing  work/chores Not difficult at all - Somewhat difficult Not difficult at all Not difficult at all   Menopausal Symptoms: no  Functional Status Survey: Is the patient deaf or have difficulty hearing?: Yes Does the patient have difficulty seeing, even when wearing glasses/contacts?: No Does the patient have difficulty concentrating, remembering, or making decisions?: Yes Does the patient have difficulty walking or climbing stairs?: No Does the patient have difficulty dressing or bathing?: No Does the patient have difficulty doing errands alone such as visiting a doctor's office or shopping?: No  Fall Risk  05/10/2020 05/10/2020 05/02/2019 06/02/2017 01/25/2017  Falls in the past year? 0 0 0 No Yes  Number falls in past yr: 0 0 0 - 2 or more  Injury with Fall? 0 0 0 - Yes    Depression Screen Depression screen [facility] 2/9 05/10/2020 11/02/2019 10/27/2019 05/02/2019 01/03/2019  Decreased Interest 0 0 0 0 0  Down, Depressed, Hopeless 0 0 0 0 0  PHQ - 2 Score 0 0 0 0 0  Altered sleeping 3 3 - 2 3  Tired, decreased energy 1 0 - 0 0  Change in appetite 0 0 - 0 0  Feeling bad or failure about yourself  0 0 - 0 0  Trouble concentrating 0 0 - 2 0  Moving slowly or fidgety/restless 0 0 - 0 1  Suicidal thoughts 0 0 - 0 0  PHQ-9 Score 4 3 - 4 4  Difficult doing work/chores Not difficult at all Not difficult at all -  Somewhat difficult Not difficult at all  Some recent data might be hidden     Advanced Directives Does patient have a HCPOA?    no Does patient have a living will or MOST form?  no  Past Medical History:  Past Medical History:  Diagnosis Date  . Acute gastric ulcer without hemorrhage or perforation   . Acute osteomyelitis of hand ([place]) 03/01/2015  . Acute posthemorrhagic anemia   . AKI (acute kidney injury) ([place]) 02/28/2015  . Arthritis   . Benign neoplasm of cecum   . Coronary artery disease    coronary stent  . Dyspnea   . Gastritis, Helicobacter pylori   . Hard of hearing   . Heart  murmur   . Hypercholesteremia   . Hypertension   . Myocardial infarction ([place])    2015  . Osteoporosis   . Peptic ulcer   . Special screening for malignant neoplasms, colon   . Stomach irritation   . Stroke [facility])    10-12 years ago    Surgical History:  Past Surgical History:  Procedure Laterality Date  . CAROTID STENT    . CATARACT EXTRACTION W/PHACO Right 11/09/2017   Procedure: CATARACT EXTRACTION PHACO AND INTRAOCULAR LENS PLACEMENT ([place]) RIGHT;  Surgeon: Eulogio Bear, MD;  [facility];  Service: Ophthalmology;  Laterality: Right;  . CATARACT EXTRACTION W/PHACO Left 11/23/2017   Procedure: CATARACT EXTRACTION PHACO AND INTRAOCULAR LENS PLACEMENT ([place]) LEFT;  Surgeon: Eulogio Bear, MD;  Location: [place];  Service: Ophthalmology;  Laterality: Left;  . COLONOSCOPY WITH PROPOFOL N/A 10/06/2018   Procedure: COLONOSCOPY WITH PROPOFOL;  Surgeon: Virgel Manifold, MD;  Location: [facility] ENDOSCOPY;  Service: Endoscopy;  Laterality: N/A;  . ESOPHAGOGASTRODUODENOSCOPY (EGD) WITH PROPOFOL N/A 02/22/2018   Procedure: ESOPHAGOGASTRODUODENOSCOPY (EGD) WITH PROPOFOL;  Surgeon: Virgel Manifold, MD;  Location: [facility] ENDOSCOPY;  Service: Endoscopy;  Laterality: N/A;  . ESOPHAGOGASTRODUODENOSCOPY (EGD) WITH PROPOFOL N/A 06/08/2018   Procedure: ESOPHAGOGASTRODUODENOSCOPY (EGD) WITH PROPOFOL;  Surgeon: Virgel Manifold, MD;  Location: [facility] ENDOSCOPY;  Service: Endoscopy;  Laterality: N/A;  . ESOPHAGOGASTRODUODENOSCOPY (EGD) WITH PROPOFOL N/A 10/06/2018   Procedure: ESOPHAGOGASTRODUODENOSCOPY (EGD) WITH PROPOFOL;  Surgeon: Virgel Manifold, MD;  Location: [facility] ENDOSCOPY;  Service: Endoscopy;  Laterality: N/A;  . INCONTINENCE SURGERY    . THYROID SURGERY      Medications:  Current Outpatient Medications on File Prior to Visit  Medication Sig  . isosorbide mononitrate (IMDUR) 60 MG 24 hr tablet Take 60 mg by mouth daily.   No current  facility-administered medications on file prior to visit.    Allergies:  No Known Allergies  Social History:  Social History   Socioeconomic History  . Marital status: Divorced    Spouse name: Not on file  . Number of children: Not on file  . Years of education: Not on file  . Highest education level: Not on file  Occupational History  . Occupation: retired  . Occupation: part Teacher, early years/pre  Tobacco Use  . Smoking status: Former Smoker    Packs/day: 1.50    Years: 22.00    Pack years: 33.00    Quit date: 05/08/2014    Years since quitting: 6.0  . Smokeless tobacco: Never Used  . Tobacco comment: Smoked over 25 years, quit after Heart Attack in 2015.    Vaping Use  . Vaping Use: Never used  Substance and Sexual Activity  . Alcohol use: No    Alcohol/week: 0.0 standard drinks  .  Drug use: No  . Sexual activity: Not Currently  Other Topics Concern  . Not on file  Social History Narrative  . Not on file   Social Determinants of Health   Financial Resource Strain: Low Risk   . Difficulty of Paying Living Expenses: Not hard at all  Food Insecurity: No Food Insecurity  . Worried About Charity fundraiser in the Last Year: Never true  . Ran Out of Food in the Last Year: Never true  Transportation Needs: No Transportation Needs  . Lack of Transportation (Medical): No  . Lack of Transportation (Non-Medical): No  Physical Activity: Sufficiently Active  . Days of Exercise per Week: 4 days  . Minutes of Exercise per Session: 60 min  Stress: No Stress Concern Present  . Feeling of Stress : Not at all  Social Connections: Moderately Integrated  . Frequency of Communication with Friends and Family: More than three times a week  . Frequency of Social Gatherings with Friends and Family: More than three times a week  . Attends Religious Services: More than 4 times per year  . Active Member of Clubs or Organizations: Yes  . Attends Archivist Meetings: More than 4 times  per year  . Marital Status: Divorced  Human resources officer Violence: Not At Risk  . Fear of Current or Ex-Partner: No  . Emotionally Abused: No  . Physically Abused: No  . Sexually Abused: No   Social History   Tobacco Use  Smoking Status Former Smoker  . Packs/day: 1.50  . Years: 22.00  . Pack years: 33.00  . Quit date: 05/08/2014  . Years since quitting: 6.0  Smokeless Tobacco Never Used  Tobacco Comment   Smoked over 25 years, quit after Heart Attack in 2015.     Social History   Substance and Sexual Activity  Alcohol Use No  . Alcohol/week: 0.0 standard drinks    Family History:  Family History  Problem Relation Age of Onset  . Liver cancer Brother   . Heart Problems Mother   . Breast cancer Neg Hx     Past medical history, surgical history, medications, allergies, family history and social history reviewed with patient today and changes made to appropriate areas of the chart.   Review of Systems  Constitutional: Negative.   HENT: Positive for hearing loss. Negative for congestion, ear discharge, ear pain, nosebleeds, sinus pain, sore throat and tinnitus.   Eyes: Negative.   Respiratory: Negative.  Negative for stridor.   Cardiovascular: Negative.   Gastrointestinal: Negative.   Genitourinary: Negative.   Musculoskeletal: Negative.   Skin: Negative.   Neurological: Negative.   Endo/Heme/Allergies: Positive for environmental allergies. Negative for polydipsia. Bruises/bleeds easily.  Psychiatric/Behavioral: Negative.    All other ROS negative except what is listed above and in the HPI.      Objective:    BP 120/68 (BP Location: Left Arm, Patient Position: Sitting, Cuff Size: Normal)   Pulse (!) 59   Temp 98.4 F (36.9 C) (Oral)   Ht 5' 2.99" (1.6 m)   Wt 140 lb 3.2 oz (63.6 kg)   SpO2 100%   BMI 24.84 kg/m   Wt Readings from Last 3 Encounters:  05/10/20 140 lb 3.2 oz (63.6 kg)  07/31/19 141 lb (64 kg)  07/20/19 140 lb (63.5 kg)    Physical  Exam Vitals and nursing note reviewed.  Constitutional:      General: She is not in acute distress.    Appearance: Normal  appearance. She is not ill-appearing, toxic-appearing or diaphoretic.  HENT:     Head: Normocephalic and atraumatic.     Right Ear: Tympanic membrane, ear canal and external ear normal. There is no impacted cerumen.     Left Ear: Tympanic membrane, ear canal and external ear normal. There is no impacted cerumen.     Nose: Nose normal. No congestion or rhinorrhea.     Mouth/Throat:     Mouth: Mucous membranes are moist.     Pharynx: Oropharynx is clear. No oropharyngeal exudate or posterior oropharyngeal erythema.  Eyes:     General: No scleral icterus.       Right eye: No discharge.        Left eye: No discharge.     Extraocular Movements: Extraocular movements intact.     Conjunctiva/sclera: Conjunctivae normal.     Pupils: Pupils are equal, round, and reactive to light.  Neck:     Vascular: No carotid bruit.  Cardiovascular:     Rate and Rhythm: Normal rate and regular rhythm.     Pulses: Normal pulses.     Heart sounds: No murmur heard.  No friction rub. No gallop.   Pulmonary:     Effort: Pulmonary effort is normal. No respiratory distress.     Breath sounds: Normal breath sounds. No stridor. No wheezing, rhonchi or rales.  Chest:     Chest wall: No tenderness.  Abdominal:     General: Abdomen is flat. Bowel sounds are normal. There is no distension.     Palpations: Abdomen is soft. There is no mass.     Tenderness: There is no abdominal tenderness. There is no right CVA tenderness, left CVA tenderness, guarding or rebound.     Hernia: No hernia is present.  Genitourinary:    Comments: Breast and pelvic exams deferred with shared decision making Musculoskeletal:        General: No swelling, tenderness, deformity or signs of injury.     Cervical back: Normal range of motion and neck supple. No rigidity. No muscular tenderness.     Right lower leg: No  edema.     Left lower leg: No edema.  Lymphadenopathy:     Cervical: No cervical adenopathy.  Skin:    General: Skin is warm and dry.     Capillary Refill: Capillary refill takes less than 2 seconds.     Coloration: Skin is not jaundiced or pale.     Findings: No bruising, erythema, lesion or rash.  Neurological:     General: No focal deficit present.     Mental Status: She is alert and oriented to person, place, and time. Mental status is at baseline.     Cranial Nerves: No cranial nerve deficit.     Sensory: No sensory deficit.     Motor: No weakness.     Coordination: Coordination normal.     Gait: Gait normal.     Deep Tendon Reflexes: Reflexes normal.  Psychiatric:        Mood and Affect: Mood normal.        Behavior: Behavior normal.        Thought Content: Thought content normal.        Judgment: Judgment normal.     6CIT Screen 05/10/2020 05/02/2019 06/02/2017  What Year? 0 points 0 points 0 points  What month? 0 points 0 points 0 points  What time? 0 points 0 points 0 points  Count back from 20 0 points 0 points  0 points  Months in reverse 0 points 0 points 0 points  Repeat phrase 0 points 0 points 0 points  Total Score 0 0 0     Results for orders placed or performed in visit on 05/10/20  Microscopic Examination   Urine  Result Value Ref Range   WBC, UA 0-5 0 - 5 /hpf   RBC 3-10 (A) 0 - 2 /hpf   Epithelial Cells (non renal) 0-10 0 - 10 /hpf   Bacteria, UA None seen None seen/Few  CBC with Differential/Platelet  Result Value Ref Range   WBC 7.7 3.4 - 10.8 x10E3/uL   RBC 3.89 3.77 - 5.28 x10E6/uL   Hemoglobin 12.1 11.1 - 15.9 g/dL   Hematocrit 36.0 34.0 - 46.6 %   MCV 93 79 - 97 fL   MCH 31.1 26.6 - 33.0 pg   MCHC 33.6 31 - 35 g/dL   RDW 12.8 11.7 - 15.4 %   Platelets 227 150 - 450 x10E3/uL   Neutrophils 66 Not Estab. %   Lymphs 25 Not Estab. %   Monocytes 7 Not Estab. %   Eos 2 Not Estab. %   Basos 0 Not Estab. %   Neutrophils Absolute 5.0 1 - 7  x10E3/uL   Lymphocytes Absolute 1.9 0 - 3 x10E3/uL   Monocytes Absolute 0.6 0 - 0 x10E3/uL   EOS (ABSOLUTE) 0.2 0.0 - 0.4 x10E3/uL   Basophils Absolute 0.0 0 - 0 x10E3/uL   Immature Granulocytes 0 Not Estab. %   Immature Grans (Abs) 0.0 0.0 - 0.1 x10E3/uL  Comprehensive metabolic panel  Result Value Ref Range   Glucose 93 65 - 99 mg/dL   BUN 17 8 - 27 mg/dL   Creatinine, Ser 1.12 (H) 0.57 - 1.00 mg/dL   GFR calc non Af Amer 50 (L) >59 mL/min/1.73   GFR calc Af Amer 58 (L) >59 mL/min/1.73   BUN/Creatinine Ratio 15 12 - 28   Sodium 140 134 - 144 mmol/L   Potassium 5.1 3.5 - 5.2 mmol/L   Chloride 105 96 - 106 mmol/L   CO2 22 20 - 29 mmol/L   Calcium 9.7 8.7 - 10.3 mg/dL   Total Protein 6.7 6.0 - 8.5 g/dL   Albumin 4.6 3.8 - 4.8 g/dL   Globulin, Total 2.1 1.5 - 4.5 g/dL   Albumin/Globulin Ratio 2.2 1.2 - 2.2   Bilirubin Total 1.9 (H) 0.0 - 1.2 mg/dL   Alkaline Phosphatase 91 48 - 121 IU/L   AST 22 0 - 40 IU/L   ALT 20 0 - 32 IU/L  Lipid Panel w/o Chol/HDL Ratio  Result Value Ref Range   Cholesterol, Total 167 100 - 199 mg/dL   Triglycerides 149 0 - 149 mg/dL   HDL 49 >39 mg/dL   VLDL Cholesterol Cal 26 5 - 40 mg/dL   LDL Chol Calc (NIH) 92 0 - 99 mg/dL  Microalbumin, Urine Waived  Result Value Ref Range   Microalb, Ur Waived 10 0 - 19 mg/L   Creatinine, Urine Waived 10 10 - 300 mg/dL   Microalb/Creat Ratio <30 <30 mg/g  TSH  Result Value Ref Range   TSH 2.460 0.450 - 4.500 uIU/mL  Urinalysis, Routine w reflex microscopic  Result Value Ref Range   Specific Gravity, UA <1.005 (L) 1.005 - 1.030   pH, UA 6.0 5.0 - 7.5   Color, UA Yellow Yellow   Appearance Ur Clear Clear   Leukocytes,UA Trace (A) Negative   Protein,UA Negative Negative/Trace  Glucose, UA Negative Negative   Ketones, UA Negative Negative   RBC, UA Trace (A) Negative   Bilirubin, UA Negative Negative   Urobilinogen, Ur 0.2 0.2 - 1.0 mg/dL   Nitrite, UA Negative Negative   Microscopic Examination See  below:       Assessment & Plan:   Problem List Items Addressed This Visit      Cardiovascular and Mediastinum   CAD (coronary artery disease)    Will keep BP and cholesterol under good control. Continue to monitor. Call with any concerns.       Relevant Medications   isosorbide mononitrate (IMDUR) 60 MG 24 hr tablet   aspirin 81 MG EC tablet   atorvastatin (LIPITOR) 40 MG tablet   lisinopril (ZESTRIL) 10 MG tablet   metoprolol succinate (TOPROL-XL) 25 MG 24 hr tablet   Other Relevant Orders   CBC with Differential/Platelet (Completed)   Comprehensive metabolic panel (Completed)   Aortic atherosclerosis (HCC)    Will keep BP and cholesterol under good control. Continue to monitor. Call with any concerns.       Relevant Medications   isosorbide mononitrate (IMDUR) 60 MG 24 hr tablet   aspirin 81 MG EC tablet   atorvastatin (LIPITOR) 40 MG tablet   lisinopril (ZESTRIL) 10 MG tablet   metoprolol succinate (TOPROL-XL) 25 MG 24 hr tablet   Other Relevant Orders   CBC with Differential/Platelet (Completed)   Comprehensive metabolic panel (Completed)     Musculoskeletal and Integument   Osteoporosis    Due for DEXA. Ordered today. Continue current regimen. Continue to monitor.       Relevant Medications   alendronate (FOSAMAX) 70 MG tablet   Other Relevant Orders   CBC with Differential/Platelet (Completed)   Comprehensive metabolic panel (Completed)   TSH (Completed)   DG Bone Density     Genitourinary   Benign hypertensive renal disease    Under good control on current regimen. Continue current regimen. Continue to monitor. Call with any concerns. Refills given. Labs drawn today       Relevant Orders   CBC with Differential/Platelet (Completed)   Comprehensive metabolic panel (Completed)   Microalbumin, Urine Waived (Completed)   Overactive bladder    Checking urine today. Await results.       Relevant Orders   CBC with Differential/Platelet (Completed)    Comprehensive metabolic panel (Completed)   Urinalysis, Routine w reflex microscopic (Completed)     Other   Hyperlipidemia    Under good control on current regimen. Continue current regimen. Continue to monitor. Call with any concerns. Refills given. Labs drawn today       Relevant Medications   isosorbide mononitrate (IMDUR) 60 MG 24 hr tablet   aspirin 81 MG EC tablet   atorvastatin (LIPITOR) 40 MG tablet   lisinopril (ZESTRIL) 10 MG tablet   metoprolol succinate (TOPROL-XL) 25 MG 24 hr tablet   Other Relevant Orders   CBC with Differential/Platelet (Completed)   Comprehensive metabolic panel (Completed)   Lipid Panel w/o Chol/HDL Ratio (Completed)   Depression, major, recurrent, in remission ([place])    Under good control off medicine. Continue current regimen. Continue to monitor. Call with any concerns. Labs drawn today       Relevant Orders   CBC with Differential/Platelet (Completed)   Comprehensive metabolic panel (Completed)   TSH (Completed)    Other Visit Diagnoses    Encounter for Medicare annual wellness exam    -  Primary   Prevenative care  discussed today as below.    Routine general medical examination at a health care facility       Vaccines up to date. Screening labs checked today. Colonoscopy up to date. Mammogram and DEXA up to date. Continue diet and exercise. Call with any concerns.    Senile purpura ([place])       Reassured patient. Call with any concerns.    Relevant Medications   isosorbide mononitrate (IMDUR) 60 MG 24 hr tablet   aspirin 81 MG EC tablet   atorvastatin (LIPITOR) 40 MG tablet   lisinopril (ZESTRIL) 10 MG tablet   metoprolol succinate (TOPROL-XL) 25 MG 24 hr tablet       Preventative Services:  Health Risk Assessment and Personalized Prevention Plan: Done today Bone Mass Measurements: Ordered today Breast Cancer Screening: Up to date CVD Screening: Done today Cervical Cancer Screening: N/A Colon Cancer Screening: Up to  date Depression Screening: Done today Diabetes Screening: Done today Glaucoma Screening: See your eye doctor Hepatitis B vaccine: N/A Hepatitis C screening: Up to date HIV Screening: Up to date Flu Vaccine: Get in the fall Lung cancer Screening: N/A Obesity Screening: Done today Pneumonia Vaccines (2): Declined STI Screening: N/A  Follow up plan: Return in about 6 months (around 11/10/2020).  LABORATORY TESTING:  - Pap smear: not applicable  IMMUNIZATIONS:  - Tdap: Tetanus vaccination status reviewed: last tetanus booster within 10 years. - Influenza: Postponed to flu season - Pneumovax: Refused - Prevnar: Refused - COVID vaccine: Up to date  SCREENING: -Mammogram: Up to date  - Colonoscopy: Up to date  - Bone Density:Ordered today   PATIENT COUNSELING:   Advised to take 1 mg of folate supplement per day if capable of pregnancy.   Sexuality: Discussed sexually transmitted diseases, partner selection, use of condoms, avoidance of unintended pregnancy  and contraceptive alternatives.   Advised to avoid cigarette smoking.  I discussed with the patient that most people either abstain from alcohol or drink within safe limits (<=14/week and <=4 drinks/occasion for males, <=7/weeks and <= 3 drinks/occasion for females) and that the risk for alcohol disorders and other health effects rises proportionally with the number of drinks per week and how often a drinker exceeds daily limits.  Discussed cessation/primary prevention of drug use and availability of treatment for abuse.   Diet: Encouraged to adjust caloric intake to maintain  or achieve ideal body weight, to reduce intake of dietary saturated fat and total fat, to limit sodium intake by avoiding high sodium foods and not adding table salt, and to maintain adequate dietary potassium and calcium preferably from fresh fruits, vegetables, and low-fat dairy products.    stressed the importance of regular exercise  Injury  prevention: Discussed safety belts, safety helmets, smoke detector, smoking near bedding or upholstery.   Dental health: Discussed importance of regular tooth brushing, flossing, and dental visits.    NEXT PREVENTATIVE PHYSICAL DUE IN 1 YEAR. Return in about 6 months (around 11/10/2020).

## 2020-05-11 NOTE — Assessment & Plan Note (Signed)
Checking urine today. Await results.  

## 2020-05-11 NOTE — Assessment & Plan Note (Signed)
Due for DEXA. Ordered today. Continue current regimen. Continue to monitor.

## 2020-05-11 NOTE — Assessment & Plan Note (Signed)
Under good control on current regimen. Continue current regimen. Continue to monitor. Call with any concerns. Refills given. Labs drawn today.   

## 2020-05-11 NOTE — Assessment & Plan Note (Signed)
Will keep BP and cholesterol under good control. Continue to monitor. Call with any concerns.  

## 2020-05-11 NOTE — Assessment & Plan Note (Addendum)
Under good control off medicine. Continue current regimen. Continue to monitor. Call with any concerns. Labs drawn today

## 2020-05-13 DIAGNOSIS — I2510 Atherosclerotic heart disease of native coronary artery without angina pectoris: Secondary | ICD-10-CM | POA: Diagnosis not present

## 2020-05-13 DIAGNOSIS — I214 Non-ST elevation (NSTEMI) myocardial infarction: Secondary | ICD-10-CM | POA: Diagnosis not present

## 2020-05-13 DIAGNOSIS — I208 Other forms of angina pectoris: Secondary | ICD-10-CM | POA: Diagnosis not present

## 2020-05-13 DIAGNOSIS — I10 Essential (primary) hypertension: Secondary | ICD-10-CM | POA: Diagnosis not present

## 2020-05-13 DIAGNOSIS — E782 Mixed hyperlipidemia: Secondary | ICD-10-CM | POA: Diagnosis not present

## 2020-05-14 ENCOUNTER — Encounter: Payer: Self-pay | Admitting: Family Medicine

## 2020-05-17 ENCOUNTER — Ambulatory Visit: Payer: Medicare Other

## 2020-07-09 ENCOUNTER — Telehealth: Payer: Self-pay | Admitting: General Practice

## 2020-07-09 ENCOUNTER — Ambulatory Visit (INDEPENDENT_AMBULATORY_CARE_PROVIDER_SITE_OTHER): Payer: Medicare Other | Admitting: General Practice

## 2020-07-09 DIAGNOSIS — I25119 Atherosclerotic heart disease of native coronary artery with unspecified angina pectoris: Secondary | ICD-10-CM

## 2020-07-09 DIAGNOSIS — I700 Atherosclerosis of aorta: Secondary | ICD-10-CM

## 2020-07-09 DIAGNOSIS — E782 Mixed hyperlipidemia: Secondary | ICD-10-CM | POA: Diagnosis not present

## 2020-07-09 DIAGNOSIS — I129 Hypertensive chronic kidney disease with stage 1 through stage 4 chronic kidney disease, or unspecified chronic kidney disease: Secondary | ICD-10-CM | POA: Diagnosis not present

## 2020-07-09 NOTE — Chronic Care Management (AMB) (Signed)
Chronic Care Management   Follow Up Note   07/09/2020 Name: Tonya Cabrera MRN: [id number] DOB: [birth year]  Referred by: Valerie Roys, DO Reason for referral : No chief complaint on file.   Tonya Cabrera is a 69 y.o. year old female who is a primary care patient of Valerie Roys, DO. The CCM team was consulted for assistance with chronic disease management and care coordination needs.    Review of patient status, including review of consultants reports, relevant laboratory and other test results, and collaboration with appropriate care team members and the patient's provider was performed as part of comprehensive patient evaluation and provision of chronic care management services.    SDOH (Social Determinants of Health) assessments performed: Yes See Care Plan activities for detailed interventions related to [facility])     Outpatient Encounter Medications as of 07/09/2020  Medication Sig  . alendronate (FOSAMAX) 70 MG tablet Take with a full glass of water on an empty stomach.  Marland Kitchen aspirin 81 MG EC tablet TAKE 1 TABLET (81 MG TOTAL) BY MOUTH DAILY.  Marland Kitchen atorvastatin (LIPITOR) 40 MG tablet Take 1 tablet (40 mg total) by mouth daily.  . isosorbide mononitrate (IMDUR) 60 MG 24 hr tablet Take 60 mg by mouth daily.  Marland Kitchen lisinopril (ZESTRIL) 10 MG tablet Take 1 tablet (10 mg total) by mouth daily.  . metoprolol succinate (TOPROL-XL) 25 MG 24 hr tablet Take 1 tablet (25 mg total) by mouth 2 (two) times daily.   No facility-administered encounter medications on file as of 07/09/2020.     Objective:   Goals Addressed            This Visit's Progress   . RNCM: Chronic disease management and support       Current Barriers:  . Chronic Disease Management support, education, and care coordination needs related to CAD, HTN, HLD, and Aortic Atherosclerosis  . Clinical Goal(s) related to : CAD, HTN, HLD, and Aortic Atherosclerosis:  Over the next 120 days, patient will:  . Work with the  care management team to address educational, disease management, and care coordination needs  . Begin or continue self health monitoring activities as directed today Measure and record blood pressure at least 2  times per week and discuss with pcp if she needs to take her BP daily and record, follow a heart healthy diet . Call provider office for new or worsened signs and symptoms Blood pressure findings outside established parameters and New or worsened symptom related to numbness and loss of feeling in the tips of her fingers on each hand (new concern for the patient) . Call care management team with questions or concerns . Verbalize basic understanding of patient centered plan of care established today  . Interventions related to CAD, HTN, HLD, and Aortic Atherosclerosis:  . Evaluation of current treatment plans and patient's adherence to plan as established by provider: The patient saw cardiologist recently and her Imdur was increased to 60 mg. 07-09-2020: the patient had visits with the cardiologist and pcp in August and got good reports. No changes in medication regimen. The patient is pleased with how well things are currently going.  . Assessed patient understanding of disease states: The patient still is having numbness in her hands and fingertips- the patient feels she needs to see a neurologist.  Will collaborate with pcp to see if a neurologist referral is appropriate or what other recommendations the pcp may have for the patient. 07-09-2020: Denies any issues related  to numbness in hands at this time. Is gardening and working outside a lot. She is still working her part time job and feels great.  . Assessed patient's education and care coordination needs: The patient is working out in her yard sometimes 6 to 8 hours. 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  07-09-2020: The patient  denies any new needs at this time. The patient states that she is staying busy and this is helping her with her health and well being. No new issues with depression.  . Provided disease specific education to patient: Patient does not currently have a blood pressure cuff, she let her daughter have hers. She has the OTC product book and will be able to order a new one on April 1st.  Education and support given to the patient on benefits of checking blood pressure and recording, especially due to the increase in Imdur dosage.  Reviewed with the patient the s/s of orthostatic hypotension and changing position slowly. 07-09-2020: She has a functioning blood pressure cuff now. States that her blood pressure is doing good. She denies any issues with HTN.  The patient states everything is under good control presently. Review and updated COVID 19 vaccination information.  The patient has had both vaccinations and received them at [facility].  Nash Dimmer with appropriate clinical care team members regarding patient needs.  No needs at this time from the CCM team LCSW and pharmacist. . Evaluation of upcoming appointments. The patient will see the pcp again 11-11-2020.  Knows to call for changes or needs before then.   . Patient Self Care Activities related to CAD, HTN, HLD, and Aortic Atherosclerosis: . Patient is unable to independently self-manage chronic health conditions  Please see past updates related to this goal by clicking on the "Past Updates" button in the selected goal      . RNCM: I really just eat what I want to       Current Barriers:  Marland Kitchen Knowledge Deficits related to adherence to a low sodium heart healthy diet  Nurse Case Manager Clinical Goal(s):  Marland Kitchen Over the next 120 days, patient will verbalize understanding of plan for watching sodium and fat content in her diet  . Over the next 120 days, patient will attend all scheduled medical appointments: Next appointment with pcp on 05-10-2020 . Over  the next 120 days, patient will demonstrate improved health management independence as evidenced bymonitoring blood pressures in the home environment, maintaining a heart healthy diet, and increasing activity . Over the next 120 days, patient will verbalize basic understanding of CAD/HLD/HTN/ And Aortic Atherosclerosis disease process and self health management plan as evidenced by starting healthy life style changes to promote health and well being . Over the next 120 days, patient will demonstrate understanding of rationale for each prescribed medication as evidenced by compliance and asking questions for any concerns she has with medications . Over the next 120 days, the patient will demonstrate ongoing self health care management ability as evidenced by improved dietary restrictions, increased physical activity, BP monitoring in the home environment as directed by pcp  Interventions:  . Evaluation of current treatment plan related to chronic disease processes and patient's adherence to plan as established by provider. . Provided education to patient re: benefits of a low sodium, heart healthy diet. 05-07-2020: the patient is working on a  Heart healthy diet. Admits she is not always compliant and since she quit smoking she does eat a lot. 07-09-2020: The patient is doing better with dietary restrictions. Denies any new needs related to dietary needs.  . Reviewed medications with patient and discussed cost constraints and compliance (no issues noted with medications) . Discussed plans with patient for ongoing care management follow up and provided patient with direct contact information for care management team . Advised patient, providing education and rationale, to monitor blood pressure daily and record, calling pcp for findings outside established parameters. 05-07-2020: the patient feels her blood pressure is elevated because she has noticed her face being "red". The patient was not at home and did  not have readings available. Education on taking blood pressures and recording regularly for the next MD and RNCM visit. 07-09-2020: the patients blood pressure has stabilized and she is doing well. Denies any new issues. Last blood pressure at provider office was 120/68.  Marland Kitchen Reviewed scheduled/upcoming provider appointments including: next appointment with pcp on 11-11-2020  Patient Self Care Activities:  . Patient verbalizes understanding of plan to follow a low sodium, heart healthy diet . Attends all scheduled provider appointments . Unable to independently manage chronic health conditions  Please see past updates related to this goal by clicking on the "Past Updates" button in the selected goal          Plan:   Telephone follow up appointment with care management team member scheduled for: 09-17-2020 at 0900 am   Noreene Larsson RN, MSN, [facility] Mobile: [phone]

## 2020-07-09 NOTE — Patient Instructions (Signed)
Visit Information  Goals Addressed            This Visit's Progress   . RNCM: Chronic disease management and support       Current Barriers:  . Chronic Disease Management support, education, and care coordination needs related to CAD, HTN, HLD, and Aortic Atherosclerosis  . Clinical Goal(s) related to : CAD, HTN, HLD, and Aortic Atherosclerosis:  Over the next 120 days, patient will:  . Work with the care management team to address educational, disease management, and care coordination needs  . Begin or continue self health monitoring activities as directed today Measure and record blood pressure at least 2  times per week and discuss with pcp if she needs to take her BP daily and record, follow a heart healthy diet . Call provider office for new or worsened signs and symptoms Blood pressure findings outside established parameters and New or worsened symptom related to numbness and loss of feeling in the tips of her fingers on each hand (new concern for the patient) . Call care management team with questions or concerns . Verbalize basic understanding of patient centered plan of care established today  . Interventions related to CAD, HTN, HLD, and Aortic Atherosclerosis:  . Evaluation of current treatment plans and patient's adherence to plan as established by provider: The patient saw cardiologist recently and her Imdur was increased to 60 mg. 07-09-2020: the patient had visits with the cardiologist and pcp in August and got good reports. No changes in medication regimen. The patient is pleased with how well things are currently going.  . Assessed patient understanding of disease states: The patient still is having numbness in her hands and fingertips- the patient feels she needs to see a neurologist.  Will collaborate with pcp to see if a neurologist referral is appropriate or what other recommendations the pcp may have for the patient. 07-09-2020: Denies any issues related to numbness in  hands at this time. Is gardening and working outside a lot. She is still working her part time job and feels great.  . Assessed patient's education and care coordination needs: The patient is working out in her yard sometimes 6 to 8 hours. 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  07-09-2020: The patient denies any new needs at this time. The patient states that she is staying busy and this is helping her with her health and well being. No new issues with depression.  . Provided disease specific education to patient: Patient does not currently have a blood pressure cuff, she let her daughter have hers. She has the OTC product book and will be able to order a new one on April 1st.  Education and support given to the patient on benefits of checking blood pressure and recording, especially due to the increase in Imdur dosage.  Reviewed with the patient the s/s of orthostatic hypotension and changing position slowly. 07-09-2020: She has a functioning blood pressure cuff now. States that her blood pressure is doing good. She denies any issues with HTN.  The patient states everything is under good control presently. Review and updated COVID 19 vaccination information.  The patient has had both vaccinations and received them at [facility]  Nash Dimmer with appropriate clinical care team members regarding patient needs.  No needs at this time from the CCM team LCSW and pharmacist. .  Evaluation of upcoming appointments. The patient will see the pcp again 11-11-2020.  Knows to call for changes or needs before then.   . Patient Self Care Activities related to CAD, HTN, HLD, and Aortic Atherosclerosis: . Patient is unable to independently self-manage chronic health conditions  Please see past updates related to this goal by clicking on the "Past Updates" button in the selected goal      . RNCM: I really just  eat what I want to       Current Barriers:  Marland Kitchen Knowledge Deficits related to adherence to a low sodium heart healthy diet  Nurse Case Manager Clinical Goal(s):  Marland Kitchen Over the next 120 days, patient will verbalize understanding of plan for watching sodium and fat content in her diet  . Over the next 120 days, patient will attend all scheduled medical appointments: Next appointment with pcp on 05-10-2020 . Over the next 120 days, patient will demonstrate improved health management independence as evidenced bymonitoring blood pressures in the home environment, maintaining a heart healthy diet, and increasing activity . Over the next 120 days, patient will verbalize basic understanding of CAD/HLD/HTN/ And Aortic Atherosclerosis disease process and self health management plan as evidenced by starting healthy life style changes to promote health and well being . Over the next 120 days, patient will demonstrate understanding of rationale for each prescribed medication as evidenced by compliance and asking questions for any concerns she has with medications . Over the next 120 days, the patient will demonstrate ongoing self health care management ability as evidenced by improved dietary restrictions, increased physical activity, BP monitoring in the home environment as directed by pcp  Interventions:  . Evaluation of current treatment plan related to chronic disease processes and patient's adherence to plan as established by provider. . Provided education to patient re: benefits of a low sodium, heart healthy diet. 05-07-2020: the patient is working on a Heart healthy diet. Admits she is not always compliant and since she quit smoking she does eat a lot. 07-09-2020: The patient is doing better with dietary restrictions. Denies any new needs related to dietary needs.  . Reviewed medications with patient and discussed cost constraints and compliance (no issues noted with medications) . Discussed plans with patient  for ongoing care management follow up and provided patient with direct contact information for care management team . Advised patient, providing education and rationale, to monitor blood pressure daily and record, calling pcp for findings outside established parameters. 05-07-2020: the patient feels her blood pressure is elevated because she has noticed her face being "red". The patient was not at home and did not have readings available. Education on taking blood pressures and recording regularly for the next MD and RNCM visit. 07-09-2020: the patients blood pressure has stabilized and she is doing well. Denies any new issues. Last blood pressure at provider office was 120/68.  Marland Kitchen Reviewed scheduled/upcoming provider appointments including: next appointment with pcp on 11-11-2020  Patient Self Care Activities:  . Patient verbalizes understanding of plan to follow a low sodium, heart healthy diet . Attends all scheduled provider appointments . Unable to independently manage chronic health conditions  Please see past updates related to this goal by clicking on the "Past Updates" button in the selected goal         Patient verbalizes understanding of instructions provided today.   Telephone follow up appointment with care management team member scheduled for: 09-17-2020 at 0900 am  [place], MSN, CCM [facility]  [facility] Mobile: [phone]

## 2020-07-16 ENCOUNTER — Telehealth: Payer: Self-pay | Admitting: *Deleted

## 2020-07-16 DIAGNOSIS — Z122 Encounter for screening for malignant neoplasm of respiratory organs: Secondary | ICD-10-CM

## 2020-07-16 DIAGNOSIS — Z87891 Personal history of nicotine dependence: Secondary | ICD-10-CM

## 2020-07-16 NOTE — Telephone Encounter (Signed)
Attempted to contact and schedule lung screening scan. Message left for patient to call back to schedule. 

## 2020-07-30 NOTE — Telephone Encounter (Signed)
Contacted and scheduled. Former smoker, quit 2015, 33 pack year

## 2020-07-30 NOTE — Addendum Note (Signed)
Addended by: Lieutenant Diego on: 07/30/2020 12:23 PM   Modules accepted: Orders

## 2020-08-05 ENCOUNTER — Ambulatory Visit
Admission: RE | Admit: 2020-08-05 | Discharge: 2020-08-05 | Disposition: A | Discharge status: Home / Self Care | Payer: Medicare Other | Source: Ambulatory Visit | Attending: Nurse Practitioner | Admitting: Nurse Practitioner

## 2020-08-05 ENCOUNTER — Other Ambulatory Visit: Payer: Self-pay

## 2020-08-05 DIAGNOSIS — Z122 Encounter for screening for malignant neoplasm of respiratory organs: Secondary | ICD-10-CM | POA: Insufficient documentation

## 2020-08-05 DIAGNOSIS — Z87891 Personal history of nicotine dependence: Secondary | ICD-10-CM | POA: Diagnosis not present

## 2020-08-08 ENCOUNTER — Encounter: Payer: Self-pay | Admitting: *Deleted

## 2020-09-17 ENCOUNTER — Ambulatory Visit: Payer: Self-pay | Admitting: General Practice

## 2020-09-17 ENCOUNTER — Telehealth: Payer: Self-pay | Admitting: General Practice

## 2020-09-17 DIAGNOSIS — I25119 Atherosclerotic heart disease of native coronary artery with unspecified angina pectoris: Secondary | ICD-10-CM

## 2020-09-17 DIAGNOSIS — E782 Mixed hyperlipidemia: Secondary | ICD-10-CM

## 2020-09-17 DIAGNOSIS — I129 Hypertensive chronic kidney disease with stage 1 through stage 4 chronic kidney disease, or unspecified chronic kidney disease: Secondary | ICD-10-CM

## 2020-09-17 DIAGNOSIS — I700 Atherosclerosis of aorta: Secondary | ICD-10-CM

## 2020-09-17 DIAGNOSIS — F339 Major depressive disorder, recurrent, unspecified: Secondary | ICD-10-CM

## 2020-09-17 NOTE — Chronic Care Management (AMB) (Signed)
Chronic Care Management   Follow Up Note   09/17/2020 Name: Tonya Cabrera MRN: [id number] DOB: [birth year]  Referred by: Valerie Roys, DO Reason for referral : Chronic Care Management (RNCM Chronic Disease Management and Care Coordination Needs)   Tonya Cabrera is a 69 y.o. year old female who is a primary care patient of Valerie Roys, DO. The CCM team was consulted for assistance with chronic disease management and care coordination needs.    Review of patient status, including review of consultants reports, relevant laboratory and other test results, and collaboration with appropriate care team members and the patient's provider was performed as part of comprehensive patient evaluation and provision of chronic care management services.    SDOH (Social Determinants of Health) assessments performed: Yes See Care Plan activities for detailed interventions related to [facility])     Outpatient Encounter Medications as of 09/17/2020  Medication Sig  . alendronate (FOSAMAX) 70 MG tablet Take with a full glass of water on an empty stomach.  Marland Kitchen aspirin 81 MG EC tablet TAKE 1 TABLET (81 MG TOTAL) BY MOUTH DAILY.  Marland Kitchen atorvastatin (LIPITOR) 40 MG tablet Take 1 tablet (40 mg total) by mouth daily.  . isosorbide mononitrate (IMDUR) 60 MG 24 hr tablet Take 60 mg by mouth daily.  Marland Kitchen lisinopril (ZESTRIL) 10 MG tablet Take 1 tablet (10 mg total) by mouth daily.  . metoprolol succinate (TOPROL-XL) 25 MG 24 hr tablet Take 1 tablet (25 mg total) by mouth 2 (two) times daily.   No facility-administered encounter medications on file as of 09/17/2020.     Objective:  BP Readings from Last 3 Encounters:  05/10/20 120/68  07/31/19 (!) 156/78  06/19/19 136/72    Goals Addressed            This Visit's Progress   . RNCM: Chronic disease management and support       Current Barriers:  . Chronic Disease Management support, education, and care coordination needs related to CAD, HTN, HLD, and  Aortic Atherosclerosis  . Clinical Goal(s) related to : CAD, HTN, HLD, and Aortic Atherosclerosis:  Over the next 120 days, patient will:  . Work with the care management team to address educational, disease management, and care coordination needs  . Begin or continue self health monitoring activities as directed today Measure and record blood pressure at least 2  times per week and discuss with pcp if she needs to take her BP daily and record, follow a heart healthy diet . Call provider office for new or worsened signs and symptoms Blood pressure findings outside established parameters and New or worsened symptom related to numbness and loss of feeling in the tips of her fingers on each hand (new concern for the patient) . Call care management team with questions or concerns . Verbalize basic understanding of patient centered plan of care established today  . Interventions related to CAD, HTN, HLD, and Aortic Atherosclerosis:  . Evaluation of current treatment plans and patient's adherence to plan as established by provider: The patient saw cardiologist recently and her Imdur was increased to 60 mg. 09-17-2020: the patient had visits with the cardiologist and pcp in August and got good reports. No changes in medication regimen. The patient is pleased with how well things are currently going. Denies any issues with her chronic heart conditions.  . Assessed patient understanding of disease states: The patient still is having numbness in her hands and fingertips- the patient feels she needs to  see a neurologist.  Will collaborate with pcp to see if a neurologist referral is appropriate or what other recommendations the pcp may have for the patient. 09-17-2020: The patient states that she is still having the numbness in her hands. It varies with the weather. This am she noted that 3 of her fingers on her right hand had numbness. Review of talking to the pcp about the numbness at her visit in February.    . Assessed patient's education and care coordination needs: The patient is working out in her yard sometimes 6 to 8 hours. 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  09-17-2020: The patient denies any new needs at this time. The patient states that she is staying busy and this is helping her with her health and well being. No new issues with depression. She is working her part time job and loves it. Discussed ways to reduce stressors that impact her blood pressure like mindfulness.  Education on talking to the pcp about the numbness in her fingers at her next pcp visit on 11-11-2020.   Marland Kitchen Provided disease specific education to patient: Patient does not currently have a blood pressure cuff, she let her daughter have hers. She has the OTC product book and will be able to order a new one on April 1st.  Education and support given to the patient on benefits of checking blood pressure and recording, especially due to the increase in Imdur dosage.  Reviewed with the patient the s/s of orthostatic hypotension and changing position slowly. 09-17-2020: She has a functioning blood pressure cuff now. States that her blood pressure is doing good. She denies any issues with HTN.  The patient states everything is under good control presently. Review and updated COVID 19 vaccination information.  The patient has had both vaccinations and received them at [facility]. The patient states that her blood pressure is better. It had been up because of all the stress going on in her life. She realizes that she can't control everything and she is doing better at not letting things bother her as bad.  The patient states that she is happy and looking forward to spending time with family at Christmas.  Nash Dimmer with appropriate clinical care team members regarding patient needs.  No needs at this time from the CCM team LCSW and  pharmacist. . Evaluation of upcoming appointments. The patient will see the pcp again 11-11-2020.  Knows to call for changes or needs before then.   . Patient Self Care Activities related to CAD, HTN, HLD, and Aortic Atherosclerosis: . Patient is unable to independently self-manage chronic health conditions  Please see past updates related to this goal by clicking on the "Past Updates" button in the selected goal      . RNCM: I really just eat what I want to       Current Barriers:  Marland Kitchen Knowledge Deficits related to adherence to a low sodium heart healthy diet  Nurse Case Manager Clinical Goal(s):  Marland Kitchen Over the next 120 days, patient will verbalize understanding of plan for watching sodium and fat content in her diet  . Over the next 120 days, patient will attend all scheduled medical appointments: Next appointment with pcp on 05-10-2020 . Over the next 120 days, patient will demonstrate improved health management independence as evidenced bymonitoring blood pressures in the home  environment, maintaining a heart healthy diet, and increasing activity . Over the next 120 days, patient will verbalize basic understanding of CAD/HLD/HTN/ And Aortic Atherosclerosis disease process and self health management plan as evidenced by starting healthy life style changes to promote health and well being . Over the next 120 days, patient will demonstrate understanding of rationale for each prescribed medication as evidenced by compliance and asking questions for any concerns she has with medications . Over the next 120 days, the patient will demonstrate ongoing self health care management ability as evidenced by improved dietary restrictions, increased physical activity, BP monitoring in the home environment as directed by pcp  Interventions:  . Evaluation of current treatment plan related to chronic disease processes and patient's adherence to plan as established by provider. 09-17-2020: The patient states she is  stable. Trying to watch her dietary habits more closely. Has gained some weight.  . Provided education to patient re: benefits of a low sodium, heart healthy diet. 05-07-2020: the patient is working on a Heart healthy diet. Admits she is not always compliant and since she quit smoking she does eat a lot. 09-17-2020: The patient is doing better with dietary restrictions. Denies any new needs related to dietary needs. Education on being mindful of foods with high sodium and fat content. The patient states she is trying to be "good".  . Reviewed medications with patient and discussed cost constraints and compliance (no issues noted with medications) . Discussed plans with patient for ongoing care management follow up and provided patient with direct contact information for care management team . Advised patient, providing education and rationale, to monitor blood pressure daily and record, calling pcp for findings outside established parameters. 05-07-2020: the patient feels her blood pressure is elevated because she has noticed her face being "red". The patient was not at home and did not have readings available. Education on taking blood pressures and recording regularly for the next MD and RNCM visit. 09-17-2020: the patients blood pressure has stabilized and she is doing well. Denies any new issues. Last blood pressure at provider office was 120/68.  Marland Kitchen Reviewed scheduled/upcoming provider appointments including: next appointment with pcp on 11-11-2020  Patient Self Care Activities:  . Patient verbalizes understanding of plan to follow a low sodium, heart healthy diet . Attends all scheduled provider appointments . Unable to independently manage chronic health conditions  Please see past updates related to this goal by clicking on the "Past Updates" button in the selected goal           Plan:   Telephone follow up appointment with care management team member scheduled for: 11-20-2020 at 0900  am   [place], MSN, [facility] Mobile: [phone]

## 2020-09-17 NOTE — Patient Instructions (Signed)
Visit Information  Goals Addressed            This Visit's Progress   . RNCM: Chronic disease management and support       Current Barriers:  . Chronic Disease Management support, education, and care coordination needs related to CAD, HTN, HLD, and Aortic Atherosclerosis  . Clinical Goal(s) related to : CAD, HTN, HLD, and Aortic Atherosclerosis:  Over the next 120 days, patient will:  . Work with the care management team to address educational, disease management, and care coordination needs  . Begin or continue self health monitoring activities as directed today Measure and record blood pressure at least 2  times per week and discuss with pcp if she needs to take her BP daily and record, follow a heart healthy diet . Call provider office for new or worsened signs and symptoms Blood pressure findings outside established parameters and New or worsened symptom related to numbness and loss of feeling in the tips of her fingers on each hand (new concern for the patient) . Call care management team with questions or concerns . Verbalize basic understanding of patient centered plan of care established today  . Interventions related to CAD, HTN, HLD, and Aortic Atherosclerosis:  . Evaluation of current treatment plans and patient's adherence to plan as established by provider: The patient saw cardiologist recently and her Imdur was increased to 60 mg. 09-17-2020: the patient had visits with the cardiologist and pcp in August and got good reports. No changes in medication regimen. The patient is pleased with how well things are currently going. Denies any issues with her chronic heart conditions.  . Assessed patient understanding of disease states: The patient still is having numbness in her hands and fingertips- the patient feels she needs to see a neurologist.  Will collaborate with pcp to see if a neurologist referral is appropriate or what other recommendations the pcp may have for the patient.  09-17-2020: The patient states that she is still having the numbness in her hands. It varies with the weather. This am she noted that 3 of her fingers on her right hand had numbness. Review of talking to the pcp about the numbness at her visit in February.   . Assessed patient's education and care coordination needs: The patient is working out in her yard sometimes 6 to 8 hours. 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  09-17-2020: The patient denies any new needs at this time. The patient states that she is staying busy and this is helping her with her health and well being. No new issues with depression. She is working her part time job and loves it. Discussed ways to reduce stressors that impact her blood pressure like mindfulness.  Education on talking to the pcp about the numbness in her fingers at her next pcp visit on 11-11-2020.   Marland Kitchen Provided disease specific education to patient: Patient does not currently have a blood pressure cuff, she let her daughter have hers. She has the OTC product book and will be able to order a new one on April 1st.  Education and support given to the patient on benefits of checking blood pressure and recording, especially due to the increase in Imdur dosage.  Reviewed with the patient the s/s of orthostatic hypotension and changing position slowly. 09-17-2020: She has a functioning blood pressure cuff  now. States that her blood pressure is doing good. She denies any issues with HTN.  The patient states everything is under good control presently. Review and updated COVID 19 vaccination information.  The patient has had both vaccinations and received them at [facility]. The patient states that her blood pressure is better. It had been up because of all the stress going on in her life. She realizes that she can't control everything and she is doing better at not letting things  bother her as bad.  The patient states that she is happy and looking forward to spending time with family at Christmas.  Nash Dimmer with appropriate clinical care team members regarding patient needs.  No needs at this time from the CCM team LCSW and pharmacist. . Evaluation of upcoming appointments. The patient will see the pcp again 11-11-2020.  Knows to call for changes or needs before then.   . Patient Self Care Activities related to CAD, HTN, HLD, and Aortic Atherosclerosis: . Patient is unable to independently self-manage chronic health conditions  Please see past updates related to this goal by clicking on the "Past Updates" button in the selected goal      . RNCM: I really just eat what I want to       Current Barriers:  Marland Kitchen Knowledge Deficits related to adherence to a low sodium heart healthy diet  Nurse Case Manager Clinical Goal(s):  Marland Kitchen Over the next 120 days, patient will verbalize understanding of plan for watching sodium and fat content in her diet  . Over the next 120 days, patient will attend all scheduled medical appointments: Next appointment with pcp on 05-10-2020 . Over the next 120 days, patient will demonstrate improved health management independence as evidenced bymonitoring blood pressures in the home environment, maintaining a heart healthy diet, and increasing activity . Over the next 120 days, patient will verbalize basic understanding of CAD/HLD/HTN/ And Aortic Atherosclerosis disease process and self health management plan as evidenced by starting healthy life style changes to promote health and well being . Over the next 120 days, patient will demonstrate understanding of rationale for each prescribed medication as evidenced by compliance and asking questions for any concerns she has with medications . Over the next 120 days, the patient will demonstrate ongoing self health care management ability as evidenced by improved dietary restrictions, increased physical  activity, BP monitoring in the home environment as directed by pcp  Interventions:  . Evaluation of current treatment plan related to chronic disease processes and patient's adherence to plan as established by provider. 09-17-2020: The patient states she is stable. Trying to watch her dietary habits more closely. Has gained some weight.  . Provided education to patient re: benefits of a low sodium, heart healthy diet. 05-07-2020: the patient is working on a Heart healthy diet. Admits she is not always compliant and since she quit smoking she does eat a lot. 09-17-2020: The patient is doing better with dietary restrictions. Denies any new needs related to dietary needs. Education on being mindful of foods with high sodium and fat content. The patient states she is trying to be "good".  . Reviewed medications with patient and discussed cost constraints and compliance (no issues noted with medications) . Discussed plans with patient for ongoing care management follow up and provided patient with direct contact information for care management team . Advised patient, providing education and rationale, to monitor blood pressure daily and record, calling pcp for findings outside established parameters. 05-07-2020: the  patient feels her blood pressure is elevated because she has noticed her face being "red". The patient was not at home and did not have readings available. Education on taking blood pressures and recording regularly for the next MD and RNCM visit. 09-17-2020: the patients blood pressure has stabilized and she is doing well. Denies any new issues. Last blood pressure at provider office was 120/68.  Marland Kitchen Reviewed scheduled/upcoming provider appointments including: next appointment with pcp on 11-11-2020  Patient Self Care Activities:  . Patient verbalizes understanding of plan to follow a low sodium, heart healthy diet . Attends all scheduled provider appointments . Unable to independently manage chronic  health conditions  Please see past updates related to this goal by clicking on the "Past Updates" button in the selected goal         The patient verbalized understanding of instructions, educational materials, and care plan provided today and declined offer to receive copy of patient instructions, educational materials, and care plan.   Telephone follow up appointment with care management team member scheduled for: 11-20-2020 at 0900 am  Noreene Larsson RN, MSN, [facility] Mobile: [phone]

## 2020-09-29 DIAGNOSIS — Z03818 Encounter for observation for suspected exposure to other biological agents ruled out: Secondary | ICD-10-CM | POA: Diagnosis not present

## 2020-09-29 DIAGNOSIS — Z20822 Contact with and (suspected) exposure to covid-19: Secondary | ICD-10-CM | POA: Diagnosis not present

## 2020-10-15 ENCOUNTER — Other Ambulatory Visit: Payer: Self-pay | Admitting: Family Medicine

## 2020-10-18 ENCOUNTER — Other Ambulatory Visit: Payer: Self-pay | Admitting: Family Medicine

## 2020-11-11 ENCOUNTER — Other Ambulatory Visit: Payer: Self-pay

## 2020-11-11 ENCOUNTER — Encounter: Payer: Self-pay | Admitting: Family Medicine

## 2020-11-11 ENCOUNTER — Ambulatory Visit (INDEPENDENT_AMBULATORY_CARE_PROVIDER_SITE_OTHER): Payer: Medicare Other | Admitting: Family Medicine

## 2020-11-11 VITALS — BP 145/75 | HR 65 | Temp 98.6°F | Wt 147.0 lb

## 2020-11-11 DIAGNOSIS — I25119 Atherosclerotic heart disease of native coronary artery with unspecified angina pectoris: Secondary | ICD-10-CM | POA: Diagnosis not present

## 2020-11-11 DIAGNOSIS — I700 Atherosclerosis of aorta: Secondary | ICD-10-CM | POA: Diagnosis not present

## 2020-11-11 DIAGNOSIS — I129 Hypertensive chronic kidney disease with stage 1 through stage 4 chronic kidney disease, or unspecified chronic kidney disease: Secondary | ICD-10-CM | POA: Diagnosis not present

## 2020-11-11 DIAGNOSIS — E782 Mixed hyperlipidemia: Secondary | ICD-10-CM

## 2020-11-11 DIAGNOSIS — R079 Chest pain, unspecified: Secondary | ICD-10-CM

## 2020-11-11 MED ORDER — ATORVASTATIN CALCIUM 40 MG PO TABS
40.0000 mg | ORAL_TABLET | Freq: Every day | ORAL | 1 refills | Status: DC
Start: 2020-11-11 — End: 2021-06-17

## 2020-11-11 MED ORDER — METOPROLOL SUCCINATE ER 25 MG PO TB24
25.0000 mg | ORAL_TABLET | Freq: Two times a day (BID) | ORAL | 1 refills | Status: DC
Start: 2020-11-11 — End: 2021-06-17

## 2020-11-11 MED ORDER — LISINOPRIL 10 MG PO TABS
10.0000 mg | ORAL_TABLET | Freq: Every day | ORAL | 1 refills | Status: DC
Start: 2020-11-11 — End: 2021-06-23

## 2020-11-11 NOTE — Assessment & Plan Note (Signed)
Under good control on current regimen. Continue current regimen. Continue to monitor. Call with any concerns. Refills given. Labs drawn today.   

## 2020-11-11 NOTE — Assessment & Plan Note (Signed)
Running a little high today. In pain. Seeing her cardiologist on Thursday. Checking labs today. Await results. Refills given today.

## 2020-11-11 NOTE — Assessment & Plan Note (Signed)
Will keep BP and cholesterol under good control. Continue to monitor. Call with any concerns.  

## 2020-11-11 NOTE — Assessment & Plan Note (Addendum)
EKG normal today. Has an appointment with cardiology on Thursday. Warning signs to go to the ER discussed today. Continue to monitor.

## 2020-11-11 NOTE — Progress Notes (Signed)
BP (!) 145/75 (BP Location: Left Arm, Cuff Size: Normal)   Pulse 65   Temp 98.6 F (37 C) (Oral)   Wt 147 lb (66.7 kg)   SpO2 98%   BMI 26.89 kg/m    Subjective:    Patient ID: Tonya Cabrera, female    DOB: 08-30-1951, 70 y.o.   MRN: [id number]  HPI: Tonya Cabrera is a 70 y.o. female  Chief Complaint  Patient presents with  . Follow-up    Pt states she is having chest pain and states she has a follow up appointment with Cardiologist and states that cold weather typically play a part in it and states she will have short of breathe sometimes with the physical chest pain. Pt states she has been dealing with this for the last six months and states it seems to be getting worse.   CHEST PAIN Duration: 6 months, more frequent and more painful x2 months Onset: sudden Quality: sharp and tight Severity: severe Location: left para substernal Radiation: none Episode duration: with activity Frequency: intermittent Related to exertion: yes Activity when pain started: exercising Trauma: no Anxiety/recent stressors: no Status: worse Treatments attempted: nothing  Current pain status: pain free Shortness of breath: yes Cough: yes Nausea: no Diaphoresis: no Heartburn: no Palpitations: no   HYPERTENSION / HYPERLIPIDEMIA Satisfied with current treatment? yes Duration of hypertension: chronic BP monitoring frequency: not checking BP medication side effects: no Past BP meds: lisinopril, metoprolol, imdur Duration of hyperlipidemia: chronic Cholesterol medication side effects: no Cholesterol supplements: none Past cholesterol medications: atrovastatin Medication compliance: excellent compliance Aspirin: yes Recent stressors: no Recurrent headaches: no Visual changes: no Palpitations: no Dyspnea: yes Chest pain: yes Lower extremity edema: no Dizzy/lightheaded: no    Relevant past medical, surgical, family and social history reviewed and updated as indicated. Interim  medical history since our last visit reviewed. Allergies and medications reviewed and updated.  Review of Systems  Constitutional: Positive for fatigue. Negative for activity change, appetite change, chills, diaphoresis, fever and unexpected weight change.  HENT: Negative.   Respiratory: Positive for chest tightness and shortness of breath. Negative for apnea, cough, choking, wheezing and stridor.   Cardiovascular: Positive for chest pain. Negative for palpitations and leg swelling.  Gastrointestinal: Negative.   Psychiatric/Behavioral: Negative.     Per HPI unless specifically indicated above     Objective:    BP (!) 145/75 (BP Location: Left Arm, Cuff Size: Normal)   Pulse 65   Temp 98.6 F (37 C) (Oral)   Wt 147 lb (66.7 kg)   SpO2 98%   BMI 26.89 kg/m   Wt Readings from Last 3 Encounters:  11/11/20 147 lb (66.7 kg)  08/05/20 140 lb (63.5 kg)  05/10/20 140 lb 3.2 oz (63.6 kg)    Physical Exam Vitals and nursing note reviewed.  Constitutional:      General: She is not in acute distress.    Appearance: Normal appearance. She is not ill-appearing, toxic-appearing or diaphoretic.  HENT:     Head: Normocephalic and atraumatic.     Right Ear: External ear normal.     Left Ear: External ear normal.     Nose: Nose normal.     Mouth/Throat:     Mouth: Mucous membranes are moist.     Pharynx: Oropharynx is clear.  Eyes:     General: No scleral icterus.       Right eye: No discharge.        Left eye: No discharge.  Extraocular Movements: Extraocular movements intact.     Conjunctiva/sclera: Conjunctivae normal.     Pupils: Pupils are equal, round, and reactive to light.  Cardiovascular:     Rate and Rhythm: Normal rate and regular rhythm.     Pulses: Normal pulses.     Heart sounds: Normal heart sounds. No murmur heard. No friction rub. No gallop.   Pulmonary:     Effort: Pulmonary effort is normal. No respiratory distress.     Breath sounds: Normal breath sounds.  No stridor. No wheezing, rhonchi or rales.  Chest:     Chest wall: No tenderness.  Musculoskeletal:        General: Normal range of motion.     Cervical back: Normal range of motion and neck supple.  Skin:    General: Skin is warm and dry.     Capillary Refill: Capillary refill takes less than 2 seconds.     Coloration: Skin is not jaundiced or pale.     Findings: No bruising, erythema, lesion or rash.  Neurological:     General: No focal deficit present.     Mental Status: She is alert and oriented to person, place, and time. Mental status is at baseline.  Psychiatric:        Mood and Affect: Mood normal.        Behavior: Behavior normal.        Thought Content: Thought content normal.        Judgment: Judgment normal.     Results for orders placed or performed in visit on 05/10/20  Microscopic Examination   Urine  Result Value Ref Range   WBC, UA 0-5 0 - 5 /hpf   RBC 3-10 (A) 0 - 2 /hpf   Epithelial Cells (non renal) 0-10 0 - 10 /hpf   Bacteria, UA None seen None seen/Few  CBC with Differential/Platelet  Result Value Ref Range   WBC 7.7 3.4 - 10.8 x10E3/uL   RBC 3.89 3.77 - 5.28 x10E6/uL   Hemoglobin 12.1 11.1 - 15.9 g/dL   Hematocrit 36.0 34.0 - 46.6 %   MCV 93 79 - 97 fL   MCH 31.1 26.6 - 33.0 pg   MCHC 33.6 31.5 - 35.7 g/dL   RDW 12.8 11.7 - 15.4 %   Platelets 227 150 - 450 x10E3/uL   Neutrophils 66 Not Estab. %   Lymphs 25 Not Estab. %   Monocytes 7 Not Estab. %   Eos 2 Not Estab. %   Basos 0 Not Estab. %   Neutrophils Absolute 5.0 1.4 - 7.0 x10E3/uL   Lymphocytes Absolute 1.9 0.7 - 3.1 x10E3/uL   Monocytes Absolute 0.6 0.1 - 0.9 x10E3/uL   EOS (ABSOLUTE) 0.2 0.0 - 0.4 x10E3/uL   Basophils Absolute 0.0 0.0 - 0.2 x10E3/uL   Immature Granulocytes 0 Not Estab. %   Immature Grans (Abs) 0.0 0.0 - 0.1 x10E3/uL  Comprehensive metabolic panel  Result Value Ref Range   Glucose 93 65 - 99 mg/dL   BUN 17 8 - 27 mg/dL   Creatinine, Ser 1.12 (H) 0.57 - 1.00 mg/dL    GFR calc non Af Amer 50 (L) >59 mL/min/1.73   GFR calc Af Amer 58 (L) >59 mL/min/1.73   BUN/Creatinine Ratio 15 12 - 28   Sodium 140 134 - 144 mmol/L   Potassium 5.1 3.5 - 5.2 mmol/L   Chloride 105 96 - 106 mmol/L   CO2 22 20 - 29 mmol/L   Calcium 9.7 8.7 -  10.3 mg/dL   Total Protein 6.7 6.0 - 8.5 g/dL   Albumin 4.6 3.8 - 4.8 g/dL   Globulin, Total 2.1 1.5 - 4.5 g/dL   Albumin/Globulin Ratio 2.2 1.2 - 2.2   Bilirubin Total 1.9 (H) 0.0 - 1.2 mg/dL   Alkaline Phosphatase 91 48 - 121 IU/L   AST 22 0 - 40 IU/L   ALT 20 0 - 32 IU/L  Lipid Panel w/o Chol/HDL Ratio  Result Value Ref Range   Cholesterol, Total 167 100 - 199 mg/dL   Triglycerides 149 0 - 149 mg/dL   HDL 49 >39 mg/dL   VLDL Cholesterol Cal 26 5 - 40 mg/dL   LDL Chol Calc (NIH) 92 0 - 99 mg/dL  Microalbumin, Urine Waived  Result Value Ref Range   Microalb, Ur Waived 10 0 - 19 mg/L   Creatinine, Urine Waived 10 10 - 300 mg/dL   Microalb/Creat Ratio <30 <30 mg/g  TSH  Result Value Ref Range   TSH 2.460 0.450 - 4.500 uIU/mL  Urinalysis, Routine w reflex microscopic  Result Value Ref Range   Specific Gravity, UA <1.005 (L) 1.005 - 1.030   pH, UA 6.0 5.0 - 7.5   Color, UA Yellow Yellow   Appearance Ur Clear Clear   Leukocytes,UA Trace (A) Negative   Protein,UA Negative Negative/Trace   Glucose, UA Negative Negative   Ketones, UA Negative Negative   RBC, UA Trace (A) Negative   Bilirubin, UA Negative Negative   Urobilinogen, Ur 0.2 0.2 - 1.0 mg/dL   Nitrite, UA Negative Negative   Microscopic Examination See below:       Assessment & Plan:   Problem List Items Addressed This Visit      Cardiovascular and Mediastinum   CAD (coronary artery disease)    EKG normal today. Has an appointment with cardiology on Thursday. Warning signs to go to the ER discussed today. Continue to monitor.       Relevant Medications   metoprolol succinate (TOPROL-XL) 25 MG 24 hr tablet   atorvastatin (LIPITOR) 40 MG tablet    lisinopril (ZESTRIL) 10 MG tablet   Aortic atherosclerosis (HCC)    Will keep BP and cholesterol under good control. Continue to monitor. Call with any concerns.       Relevant Medications   metoprolol succinate (TOPROL-XL) 25 MG 24 hr tablet   atorvastatin (LIPITOR) 40 MG tablet   lisinopril (ZESTRIL) 10 MG tablet     Genitourinary   Benign hypertensive renal disease    Running a little high today. In pain. Seeing her cardiologist on Thursday. Checking labs today. Await results. Refills given today.       Relevant Orders   Comprehensive metabolic panel   CBC with Differential/Platelet     Other   Hyperlipidemia    Under good control on current regimen. Continue current regimen. Continue to monitor. Call with any concerns. Refills given. Labs drawn today.       Relevant Medications   metoprolol succinate (TOPROL-XL) 25 MG 24 hr tablet   atorvastatin (LIPITOR) 40 MG tablet   lisinopril (ZESTRIL) 10 MG tablet   Other Relevant Orders   Comprehensive metabolic panel   Lipid Panel w/o Chol/HDL Ratio    Other Visit Diagnoses    Chest pain, unspecified type    -  Primary   EKG normal today. Has an appointment with cardiology on Thursday. Warning signs to go to the ER discussed today. Continue to monitor.    Relevant Orders  EKG 12-Lead (Completed)       Follow up plan: Return in about 6 months (around 05/11/2021) for physical.

## 2020-11-12 LAB — CBC WITH DIFFERENTIAL/PLATELET
Basophils Absolute: 0 10*3/uL (ref 0.0–0.2)
Basos: 1 %
EOS (ABSOLUTE): 0.2 10*3/uL (ref 0.0–0.4)
Eos: 3 %
Hematocrit: 37.4 % (ref 34.0–46.6)
Hemoglobin: 12.3 g/dL (ref 11.1–15.9)
Immature Grans (Abs): 0 10*3/uL (ref 0.0–0.1)
Immature Granulocytes: 1 %
Lymphocytes Absolute: 2 10*3/uL (ref 0.7–3.1)
Lymphs: 25 %
MCH: 30.8 pg (ref 26.6–33.0)
MCHC: 32.9 g/dL (ref 31.5–35.7)
MCV: 94 fL (ref 79–97)
Monocytes Absolute: 0.6 10*3/uL (ref 0.1–0.9)
Monocytes: 8 %
Neutrophils Absolute: 5.1 10*3/uL (ref 1.4–7.0)
Neutrophils: 62 %
Platelets: 208 10*3/uL (ref 150–450)
RBC: 4 x10E6/uL (ref 3.77–5.28)
RDW: 13.7 % (ref 11.7–15.4)
WBC: 7.9 10*3/uL (ref 3.4–10.8)

## 2020-11-12 LAB — COMPREHENSIVE METABOLIC PANEL
ALT: 35 IU/L — ABNORMAL HIGH (ref 0–32)
AST: 27 IU/L (ref 0–40)
Albumin/Globulin Ratio: 2 (ref 1.2–2.2)
Albumin: 4.2 g/dL (ref 3.8–4.8)
Alkaline Phosphatase: 108 IU/L (ref 44–121)
BUN/Creatinine Ratio: 14 (ref 12–28)
BUN: 11 mg/dL (ref 8–27)
Bilirubin Total: 1.5 mg/dL — ABNORMAL HIGH (ref 0.0–1.2)
CO2: 21 mmol/L (ref 20–29)
Calcium: 9.7 mg/dL (ref 8.7–10.3)
Chloride: 106 mmol/L (ref 96–106)
Creatinine, Ser: 0.76 mg/dL (ref 0.57–1.00)
GFR calc Af Amer: 93 mL/min/{1.73_m2} (ref >59)
GFR calc non Af Amer: 80 mL/min/{1.73_m2} (ref >59)
Globulin, Total: 2.1 g/dL (ref 1.5–4.5)
Glucose: 83 mg/dL (ref 65–99)
Potassium: 4 mmol/L (ref 3.5–5.2)
Sodium: 143 mmol/L (ref 134–144)
Total Protein: 6.3 g/dL (ref 6.0–8.5)

## 2020-11-12 LAB — LIPID PANEL W/O CHOL/HDL RATIO
Cholesterol, Total: 192 mg/dL (ref 100–199)
HDL: 57 mg/dL (ref >39)
LDL Chol Calc (NIH): 100 mg/dL — ABNORMAL HIGH (ref 0–99)
Triglycerides: 208 mg/dL — ABNORMAL HIGH (ref 0–149)
VLDL Cholesterol Cal: 35 mg/dL (ref 5–40)

## 2020-11-14 DIAGNOSIS — I519 Heart disease, unspecified: Secondary | ICD-10-CM | POA: Diagnosis not present

## 2020-11-14 DIAGNOSIS — I429 Cardiomyopathy, unspecified: Secondary | ICD-10-CM | POA: Diagnosis not present

## 2020-11-14 DIAGNOSIS — I2510 Atherosclerotic heart disease of native coronary artery without angina pectoris: Secondary | ICD-10-CM | POA: Diagnosis not present

## 2020-11-14 DIAGNOSIS — R0602 Shortness of breath: Secondary | ICD-10-CM | POA: Diagnosis not present

## 2020-11-14 DIAGNOSIS — Z720 Tobacco use: Secondary | ICD-10-CM | POA: Diagnosis not present

## 2020-11-14 DIAGNOSIS — I700 Atherosclerosis of aorta: Secondary | ICD-10-CM | POA: Diagnosis not present

## 2020-11-14 DIAGNOSIS — F17200 Nicotine dependence, unspecified, uncomplicated: Secondary | ICD-10-CM | POA: Diagnosis not present

## 2020-11-14 DIAGNOSIS — I25118 Atherosclerotic heart disease of native coronary artery with other forms of angina pectoris: Secondary | ICD-10-CM | POA: Diagnosis not present

## 2020-11-14 DIAGNOSIS — I208 Other forms of angina pectoris: Secondary | ICD-10-CM | POA: Diagnosis not present

## 2020-11-14 DIAGNOSIS — I214 Non-ST elevation (NSTEMI) myocardial infarction: Secondary | ICD-10-CM | POA: Diagnosis not present

## 2020-11-14 DIAGNOSIS — E782 Mixed hyperlipidemia: Secondary | ICD-10-CM | POA: Diagnosis not present

## 2020-11-14 DIAGNOSIS — I10 Essential (primary) hypertension: Secondary | ICD-10-CM | POA: Diagnosis not present

## 2020-11-17 ENCOUNTER — Encounter: Payer: Self-pay | Admitting: Family Medicine

## 2020-11-17 NOTE — Progress Notes (Signed)
Interpreted by me on 11/11/20. NSR at 62bpm. No ST segment changes.

## 2020-11-20 ENCOUNTER — Ambulatory Visit (INDEPENDENT_AMBULATORY_CARE_PROVIDER_SITE_OTHER): Payer: Medicare Other | Admitting: General Practice

## 2020-11-20 ENCOUNTER — Telehealth: Payer: Self-pay | Admitting: General Practice

## 2020-11-20 DIAGNOSIS — F419 Anxiety disorder, unspecified: Secondary | ICD-10-CM

## 2020-11-20 DIAGNOSIS — I10 Essential (primary) hypertension: Secondary | ICD-10-CM | POA: Diagnosis not present

## 2020-11-20 DIAGNOSIS — R079 Chest pain, unspecified: Secondary | ICD-10-CM

## 2020-11-20 DIAGNOSIS — I25119 Atherosclerotic heart disease of native coronary artery with unspecified angina pectoris: Secondary | ICD-10-CM | POA: Diagnosis not present

## 2020-11-20 DIAGNOSIS — F339 Major depressive disorder, recurrent, unspecified: Secondary | ICD-10-CM | POA: Diagnosis not present

## 2020-11-20 DIAGNOSIS — E782 Mixed hyperlipidemia: Secondary | ICD-10-CM | POA: Diagnosis not present

## 2020-11-20 NOTE — Chronic Care Management (AMB) (Signed)
Chronic Care Management   CCM RN Visit Note  11/20/2020 Name: Tonya Cabrera MRN: [id number] DOB: Mar 21, 1951  Subjective: Tonya Cabrera is a 70 y.o. year old female who is a primary care patient of Valerie Roys, DO. The care management team was consulted for assistance with disease management and care coordination needs.    Engaged with patient by telephone for follow up visit in response to provider referral for case management and/or care coordination services.   Consent to Services:  The patient was given information about Chronic Care Management services, agreed to services, and gave verbal consent prior to initiation of services.  Please see initial visit note for detailed documentation.   Patient agreed to services and verbal consent obtained.   Assessment: Review of patient past medical history, allergies, medications, health status, including review of consultants reports, laboratory and other test data, was performed as part of comprehensive evaluation and provision of chronic care management services.   SDOH (Social Determinants of Health) assessments and interventions performed:    CCM Care Plan  No Known Allergies  Outpatient Encounter Medications as of 11/20/2020  Medication Sig  . alendronate (FOSAMAX) 70 MG tablet Take with a full glass of water on an empty stomach.  Marland Kitchen aspirin 81 MG EC tablet TAKE 1 TABLET (81 MG TOTAL) BY MOUTH DAILY.  Marland Kitchen atorvastatin (LIPITOR) 40 MG tablet Take 1 tablet (40 mg total) by mouth daily.  . isosorbide mononitrate (IMDUR) 60 MG 24 hr tablet Take 60 mg by mouth daily.  Marland Kitchen lisinopril (ZESTRIL) 10 MG tablet Take 1 tablet (10 mg total) by mouth daily.  . metoprolol succinate (TOPROL-XL) 25 MG 24 hr tablet Take 1 tablet (25 mg total) by mouth 2 (two) times daily.   No facility-administered encounter medications on file as of 11/20/2020.    Patient Active Problem List   Diagnosis Date Noted  . Overactive bladder 06/19/2019  . Urge  incontinence of urine 06/19/2019  . Advance directive discussed with patient 05/02/2019  . Diverticulosis of large intestine without diverticulitis   . Depression, major, recurrent, in remission ([facility]) 03/04/2018  . Symptomatic anemia 02/19/2018  . Personal history of tobacco use, presenting hazards to health 07/11/2016  . Aortic atherosclerosis ([place]) 07/09/2016  . Osteoporosis 07/07/2016  . Myopia of both eyes 06/16/2016  . Hyperlipidemia 06/08/2016  . CAD (coronary artery disease) 06/08/2016  . Snoring 06/08/2016  . Benign hypertensive renal disease 11/14/2015    Conditions to be addressed/monitored:CAD, HTN, HLD, Anxiety and Depression  Care Plan : RNCM: Coronary Artery Disease (Adult) and HLD  Updates made by Vanita Ingles since 11/20/2020 12:00 AM    Problem: RNCM: Disease Progression (Coronary Artery Disease)   Priority: Medium    Long-Range Goal: RNCM: Disease Progression Prevented or Minimized   Priority: Medium  Note:   Current Barriers:  . Poorly controlled hyperlipidemia, complicated by active chest pain- seeing pcp and specialist . Current antihyperlipidemic regimen: atorvastatin 40 mg QD . Most recent lipid panel:     Component Value Date/Time   CHOL 192 11/11/2020 1332   CHOL 235 (H) 06/08/2016 0953   CHOL 192 05/16/2014 0020   TRIG 208 (H) 11/11/2020 1332   TRIG 147 06/08/2016 0953   TRIG 106 05/16/2014 0020   HDL 57 11/11/2020 1332   HDL 48 05/16/2014 0020   CHOLHDL 3.3 04/09/2016 1828   VLDL 29 06/08/2016 0953   VLDL 21 05/16/2014 0020   LDLCALC 100 (H) 11/11/2020 1332   LDLCALC 123 (  H) 05/16/2014 0020 .   Marland Kitchen ASCVD risk enhancing conditions: age >65, CAD, former MI,  HTN, former smoker . Unable to independently manage new onset of chest pain, HLD and CAD . Lacks social connections . Does not contact provider office for questions/concerns  RN Care Manager Clinical Goal(s):  Marland Kitchen Over the nex120 days, patient will work with Consulting civil engineer, providers, and  care team towards execution of optimized self-health management plan . patient will verbalize understanding of plan for effective management of chest pain and discomfort and on going support of chronic conditions of CAD and HLD  . patient will work with RNCM, pcp and specialist  to address needs related to new onset of chest pain, HLD, and CAD . patient will demonstrate a decrease in chest pain  exacerbations as evidenced by finding etiology of chest pain, adherence to heart healthy diet and taking medications as ordered  . patient will attend all scheduled medical appointments: 05-12-2021 and having stress test in March of 2022  Interventions: . Collaboration with Valerie Roys, DO regarding development and update of comprehensive plan of care as evidenced by provider attestation and co-signature . Inter-disciplinary care team collaboration (see longitudinal plan of care) . Medication review performed; medication list updated in electronic medical record.  Bertram Savin care team collaboration (see longitudinal plan of care) . Referred to pharmacy team for assistance with HLD and CAD medication management . Evaluation of current treatment plan related to HLD and CAD and patient's adherence to plan as established by provider. . Advised patient to call the office for changes in condition or questions, seek emergent care for Chest pain not resolved by NTG, if having chest pain call pcp or specialist and notify of increased incidence of chest pain or discomfort  . Provided education to patient re: taking NTG for new onset of chest pain, seeking emergent care if needed, pacing activity, adherence to provider appointments, and eating heart healthy diet . Reviewed scheduled/upcoming provider appointments including: stress test in March of 2022, pcp 05-12-2021 at 1:40 pm . Discussed plans with patient for ongoing care management follow up and provided patient with direct contact information for care  management team   Patient Goals/Self-Care Activities: . Over the next 120 days, patient will:   - call for medicine refill 2 or 3 days before it runs out - call if I am sick and can't take my medicine - keep a list of all the medicines I take; vitamins and herbals too - learn to read medicine labels - use a pillbox to sort medicine - use an alarm clock or phone to remind me to take my medicine - change to whole grain breads, cereal, pasta - drink 6 to 8 glasses of water each day - eat 3 to 5 servings of fruits and vegetables each day - eat 5 or 6 small meals each day - fill half the plate with nonstarchy vegetables - limit fast food meals to no more than 1 per week - manage portion size - prepare main meal at home 3 to 5 days each week - read food labels for fat, fiber, carbohydrates and portion size - be open to making changes - I can manage, know and watch for signs of a heart attack - if I have chest pain, call for help -use NTG as needed for new onset of chest pain - learn about small changes that will make a big difference - learn my personal risk factors   Follow Up  Plan: Telephone follow up appointment with care management team member scheduled for: 01-15-2021 at 0900 am     Task: Alleviate Barriers to Coronary Artery Disease Therapy  Care Plan : RNCM: Hypertension (Adult)  Updates made by Vanita Ingles since 11/20/2020 12:00 AM    Problem: RNCM: Hypertension (Hypertension)   Priority: Medium    Goal: RNCM: Hypertension Monitored   Priority: Medium  Note:   Objective:  . Last practice recorded BP readings:  BP Readings from Last 3 Encounters:  11/11/20 (!) 145/75  05/10/20 120/68  07/31/19 (!) 156/78 .   Marland Kitchen Most recent eGFR/CrCl: No results found for: EGFR  No components found for: CRCL Current Barriers:  Marland Kitchen Knowledge Deficits related to basic understanding of hypertension pathophysiology and self care management . Knowledge Deficits related to understanding of  medications prescribed for management of hypertension . Limited Social Support . Lacks social connections . Does not contact provider office for questions/concerns Case Manager Clinical Goal(s):  Marland Kitchen Over the next 120 days, patient will verbalize understanding of plan for hypertension management . Over the next 120 days, patient will attend all scheduled medical appointments: 05-12-2021 . Over the next 120 days, patient will demonstrate improved adherence to prescribed treatment plan for hypertension as evidenced by taking all medications as prescribed, monitoring and recording blood pressure as directed, adhering to low sodium/DASH diet . Over the next 120 days, patient will demonstrate improved health management independence as evidenced by checking blood pressure as directed and notifying PCP if SBP>160 or DBP > 90, taking all medications as prescribe, and adhering to a low sodium diet as discussed. . Over the next 120 days, patient will verbalize basic understanding of hypertension disease process and self health management plan as evidenced by compliance with medications, compliance with dietary restrictions, and working with the CCM team to manage health and well being  Interventions:  . Collaboration with Valerie Roys, DO regarding development and update of comprehensive plan of care as evidenced by provider attestation and co-signature . Inter-disciplinary care team collaboration (see longitudinal plan of care) . Evaluation of current treatment plan related to hypertension self management and patient's adherence to plan as established by provider. . Provided education to patient re: stroke prevention, s/s of heart attack and stroke, DASH diet, complications of uncontrolled blood pressure . Reviewed medications with patient and discussed importance of compliance . Discussed plans with patient for ongoing care management follow up and provided patient with direct contact information for care  management team . Advised patient, providing education and rationale, to monitor blood pressure daily and record, calling PCP for findings outside established parameters.  . Reviewed scheduled/upcoming provider appointments including: 05-12-2021 Patient Goals:  Self-Care Activities: - Self administers medications as prescribed Attends all scheduled provider appointments Calls provider office for new concerns, questions, or BP outside discussed parameters Checks BP and records as discussed Follows a low sodium diet/DASH diet - blood pressure trends reviewed - depression screen reviewed - home or ambulatory blood pressure monitoring encouraged Follow Up Plan: Telephone follow up appointment with care management team member scheduled for: 01-15-2021 at 0900 am    Task: RNCM: Identify and Monitor Blood Pressure Elevation   Note:   Care Management Activities:    - blood pressure trends reviewed - depression screen reviewed - home or ambulatory blood pressure monitoring encouraged       Care Plan : RNCM: Depression (Adult) and Anxiety  Updates made by Vanita Ingles since 11/20/2020 12:00 AM  Problem: RNCM: Depression Identification (Depression) and anxiety   Priority: Medium    Goal: RNCM: Depressive Symptoms Identified/Anxiety triggers   Priority: Medium  Note:   Current Barriers:  . Chronic Disease Management support and education needs related to depression and anxiety  . Lacks caregiver support.  Leodis [place] social connections . Does not contact provider office for questions/concerns  Nurse Case Manager Clinical Goal(s):  . patient will verbalize understanding of plan for effective management of depression and anxiety  . patient will work with [facility] and pcp  to address needs related to increased anxiety and depression  . patient will demonstrate a decrease in anxiety and depression  exacerbations as evidenced by resolution of chest pain, normalized mood, improved health and well  being . patient will attend all scheduled medical appointments: 05-12-2021  Interventions:  . 1:1 collaboration with Valerie Roys, DO regarding development and update of comprehensive plan of care as evidenced by provider attestation and co-signature . Inter-disciplinary care team collaboration (see longitudinal plan of care) . Evaluation of current treatment plan related to depression and anxiety  and patient's adherence to plan as established by provider. . Advised patient to call the office for changes in mood, increased anxiety or depression  . Provided education to patient re: pacing activity, getting rest, monitoring for triggers . Reviewed medications with patient and discussed compliance  . Provided patient with depression and anxiety  educational materials related to reducing stress and how increased anxiety and depression contribute to negative outcomes with health and well being  . Discussed plans with patient for ongoing care management follow up and provided patient with direct contact information for care management team  Patient Goals/Self-Care Activities Over the next 120 days, patient will:  - Patient will self administer medications as prescribed Patient will attend all scheduled provider appointments Patient will call pharmacy for medication refills Patient will attend church or other social activities Patient will continue to perform ADL's independently Patient will continue to perform IADL's independently Patient will call provider office for new concerns or questions Patient will work with BSW to address care coordination needs and will continue to work with the clinical team to address health care and disease management related needs.    - anxiety screen reviewed - depression screen reviewed - medication list reviewed Follow Up Plan: Telephone follow up appointment with care management team member scheduled for: 01-15-2021 at 0900 am       Task: RNCM: Identify  Depressive Symptoms and Facilitate Treatment   Note:   Care Management Activities:    - anxiety screen reviewed - depression screen reviewed - medication list reviewed         Plan:Telephone follow up appointment with care management team member scheduled for:  01-15-2021 at 0900 am  [place], MSN, [facility] Mobile: [phone]

## 2020-11-20 NOTE — Patient Instructions (Signed)
Visit Information  PATIENT GOALS: Goals Addressed            This Visit's Progress   . COMPLETED: RNCM: Chronic disease management and support       Current Barriers: Closing this goal and opening in new ELS  . Chronic Disease Management support, education, and care coordination needs related to CAD, HTN, HLD, and Aortic Atherosclerosis  . Clinical Goal(s) related to : CAD, HTN, HLD, and Aortic Atherosclerosis:  Over the next 120 days, patient will:  . Work with the care management team to address educational, disease management, and care coordination needs  . Begin or continue self health monitoring activities as directed today Measure and record blood pressure at least 2  times per week and discuss with pcp if she needs to take her BP daily and record, follow a heart healthy diet . Call provider office for new or worsened signs and symptoms Blood pressure findings outside established parameters and New or worsened symptom related to numbness and loss of feeling in the tips of her fingers on each hand (new concern for the patient) . Call care management team with questions or concerns . Verbalize basic understanding of patient centered plan of care established today  . Interventions related to CAD, HTN, HLD, and Aortic Atherosclerosis:  . Evaluation of current treatment plans and patient's adherence to plan as established by provider: The patient saw cardiologist recently and her Imdur was increased to 60 mg. 09-17-2020: the patient had visits with the cardiologist and pcp in August and got good reports. No changes in medication regimen. The patient is pleased with how well things are currently going. Denies any issues with her chronic heart conditions.  . Assessed patient understanding of disease states: The patient still is having numbness in her hands and fingertips- the patient feels she needs to see a neurologist.  Will collaborate with pcp to see if a neurologist referral is  appropriate or what other recommendations the pcp may have for the patient. 09-17-2020: The patient states that she is still having the numbness in her hands. It varies with the weather. This am she noted that 3 of her fingers on her right hand had numbness. Review of talking to the pcp about the numbness at her visit in February.   . Assessed patient's education and care coordination needs: The patient is working out in her yard sometimes 6 to 8 hours. 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  09-17-2020: The patient denies any new needs at this time. The patient states that she is staying busy and this is helping her with her health and well being. No new issues with depression. She is working her part time job and loves it. Discussed ways to reduce stressors that impact her blood pressure like mindfulness.  Education on talking to the pcp about the numbness in her fingers at her next pcp visit on 11-11-2020.   Marland Kitchen Provided disease specific education to patient: Patient does not currently have a blood pressure cuff, she let her daughter have hers. She has the OTC product book and will be able to order a new one on April 1st.  Education and support given to the patient on benefits of checking blood pressure and recording, especially due to the increase in Imdur dosage.  Reviewed with the patient the s/s of orthostatic hypotension and  changing position slowly. 09-17-2020: She has a functioning blood pressure cuff now. States that her blood pressure is doing good. She denies any issues with HTN.  The patient states everything is under good control presently. Review and updated COVID 19 vaccination information.  The patient has had both vaccinations and received them at [facility]. The patient states that her blood pressure is better. It had been up because of all the stress going on in her life. She realizes that  she can't control everything and she is doing better at not letting things bother her as bad.  The patient states that she is happy and looking forward to spending time with family at Christmas.  Steele Sizer with appropriate clinical care team members regarding patient needs.  No needs at this time from the CCM team LCSW and pharmacist. . Evaluation of upcoming appointments. The patient will see the pcp again 11-11-2020.  Knows to call for changes or needs before then.   . Patient Self Care Activities related to CAD, HTN, HLD, and Aortic Atherosclerosis: . Patient is unable to independently self-manage chronic health conditions  Please see past updates related to this goal by clicking on the "Past Updates" button in the selected goal      . RNCM: Cope with Pain-Coronary Artery Disease       Follow Up Date 01-15-2021   - learn how to meditate - learn relaxation techniques - practice relaxation or meditation daily - spend time with positive people - tell myself I can (not I can't) - think of new ways to do favorite things - use distraction techniques - use relaxation during pain    Why is this important?    Living with heart (cardiac) pain can be scary.   Feelings like depression or anxiety can make your pain worse.   Learning ways to cope may help with the fear and uncertainty that comes with heart pain.    Notes: Working with the pcp and specialist to determine why she is having chest pain    . COMPLETED: RNCM: I really just eat what I want to       Current Barriers: Closing this goal and opening in new ELS . Knowledge Deficits related to adherence to a low sodium heart healthy diet  Nurse Case Manager Clinical Goal(s):  Marland Kitchen Over the next 120 days, patient will verbalize understanding of plan for watching sodium and fat content in her diet  . Over the next 120 days, patient will attend all scheduled medical appointments: Next appointment with pcp on 05-10-2020 . Over the next 120  days, patient will demonstrate improved health management independence as evidenced bymonitoring blood pressures in the home environment, maintaining a heart healthy diet, and increasing activity . Over the next 120 days, patient will verbalize basic understanding of CAD/HLD/HTN/ And Aortic Atherosclerosis disease process and self health management plan as evidenced by starting healthy life style changes to promote health and well being . Over the next 120 days, patient will demonstrate understanding of rationale for each prescribed medication as evidenced by compliance and asking questions for any concerns she has with medications . Over the next 120 days, the patient will demonstrate ongoing self health care management ability as evidenced by improved dietary restrictions, increased physical activity, BP monitoring in the home environment as directed by pcp  Interventions:  . Evaluation of current treatment plan related to chronic disease processes and patient's adherence to plan as established by provider. 09-17-2020: The patient states she is  stable. Trying to watch her dietary habits more closely. Has gained some weight.  . Provided education to patient re: benefits of a low sodium, heart healthy diet. 05-07-2020: the patient is working on a Heart healthy diet. Admits she is not always compliant and since she quit smoking she does eat a lot. 09-17-2020: The patient is doing better with dietary restrictions. Denies any new needs related to dietary needs. Education on being mindful of foods with high sodium and fat content. The patient states she is trying to be "good".  . Reviewed medications with patient and discussed cost constraints and compliance (no issues noted with medications) . Discussed plans with patient for ongoing care management follow up and provided patient with direct contact information for care management team . Advised patient, providing education and rationale, to monitor blood  pressure daily and record, calling pcp for findings outside established parameters. 05-07-2020: the patient feels her blood pressure is elevated because she has noticed her face being "red". The patient was not at home and did not have readings available. Education on taking blood pressures and recording regularly for the next MD and RNCM visit. 09-17-2020: the patients blood pressure has stabilized and she is doing well. Denies any new issues. Last blood pressure at provider office was 120/68.  Marland Kitchen Reviewed scheduled/upcoming provider appointments including: next appointment with pcp on 11-11-2020  Patient Self Care Activities:  . Patient verbalizes understanding of plan to follow a low sodium, heart healthy diet . Attends all scheduled provider appointments . Unable to independently manage chronic health conditions  Please see past updates related to this goal by clicking on the "Past Updates" button in the selected goal      . RNCM: Manage My Sleep Problems-Coronary Artery Disease       Timeframe:  Short-Term Goal Priority:  High Start Date:   11-20-2020                          Expected End Date:    03-27-2021                   Follow Up Date 01-15-2021   - develop a new routine to improve sleep - don't eat or exercise right before bedtime - eat healthy - get at least 7 to 8 hours of sleep at night - get outdoors every day (weather permitting) - keep room cool and dark - limit daytime naps - practice relaxation or meditation daily - use a fan or white noise in bedroom    Why is this important?    Sometimes you may have a hard time sleeping after you have a heart attack or surgery.   Feelings like depression or anxiety can make the sleep problems worse.   Many things can help you manage it and sleep better.    Notes: The patient has to take an OTC sleep aide to help her rest. She has used Ambien in the past       Patient Care Plan: RNCM: Coronary Artery Disease (Adult) and HLD     Problem Identified: RNCM: Disease Progression (Coronary Artery Disease)   Priority: Medium    Long-Range Goal: RNCM: Disease Progression Prevented or Minimized   Priority: Medium  Note:   Current Barriers:  . Poorly controlled hyperlipidemia, complicated by active chest pain- seeing pcp and specialist . Current antihyperlipidemic regimen: atorvastatin 40 mg QD . Most recent lipid panel:     Component Value Date/Time  CHOL 192 11/11/2020 1332   CHOL 235 (H) 06/08/2016 0953   CHOL 192 05/16/2014 0020   TRIG 208 (H) 11/11/2020 1332   TRIG 147 06/08/2016 0953   TRIG 106 05/16/2014 0020   HDL 57 11/11/2020 1332   HDL 48 05/16/2014 0020   CHOLHDL 3.3 04/09/2016 1828   VLDL 29 06/08/2016 0953   VLDL 21 05/16/2014 0020   LDLCALC 100 (H) 11/11/2020 1332   LDLCALC 123 (H) 05/16/2014 0020 .   Marland Kitchen ASCVD risk enhancing conditions: age >78, CAD, former MI,  HTN, former smoker . Unable to independently manage new onset of chest pain, HLD and CAD . Lacks social connections . Does not contact provider office for questions/concerns  RN Care Manager Clinical Goal(s):  Marland Kitchen Over the nex120 days, patient will work with Consulting civil engineer, providers, and care team towards execution of optimized self-health management plan . patient will verbalize understanding of plan for effective management of chest pain and discomfort and on going support of chronic conditions of CAD and HLD  . patient will work with RNCM, pcp and specialist  to address needs related to new onset of chest pain, HLD, and CAD . patient will demonstrate a decrease in chest pain  exacerbations as evidenced by finding etiology of chest pain, adherence to heart healthy diet and taking medications as ordered  . patient will attend all scheduled medical appointments: 05-12-2021 and having stress test in March of 2022  Interventions: . Collaboration with Valerie Roys, DO regarding development and update of comprehensive plan of care as  evidenced by provider attestation and co-signature . Inter-disciplinary care team collaboration (see longitudinal plan of care) . Medication review performed; medication list updated in electronic medical record.  Bertram Savin care team collaboration (see longitudinal plan of care) . Referred to pharmacy team for assistance with HLD and CAD medication management . Evaluation of current treatment plan related to HLD and CAD and patient's adherence to plan as established by provider. . Advised patient to call the office for changes in condition or questions, seek emergent care for Chest pain not resolved by NTG, if having chest pain call pcp or specialist and notify of increased incidence of chest pain or discomfort  . Provided education to patient re: taking NTG for new onset of chest pain, seeking emergent care if needed, pacing activity, adherence to provider appointments, and eating heart healthy diet . Reviewed scheduled/upcoming provider appointments including: stress test in March of 2022, pcp 05-12-2021 at 1:40 pm . Discussed plans with patient for ongoing care management follow up and provided patient with direct contact information for care management team   Patient Goals/Self-Care Activities: . Over the next 120 days, patient will:   - call for medicine refill 2 or 3 days before it runs out - call if I am sick and can't take my medicine - keep a list of all the medicines I take; vitamins and herbals too - learn to read medicine labels - use a pillbox to sort medicine - use an alarm clock or phone to remind me to take my medicine - change to whole grain breads, cereal, pasta - drink 6 to 8 glasses of water each day - eat 3 to 5 servings of fruits and vegetables each day - eat 5 or 6 small meals each day - fill half the plate with nonstarchy vegetables - limit fast food meals to no more than 1 per week - manage portion size - prepare main meal at home  3 to 5 days each week -  read food labels for fat, fiber, carbohydrates and portion size - be open to making changes - I can manage, know and watch for signs of a heart attack - if I have chest pain, call for help -use NTG as needed for new onset of chest pain - learn about small changes that will make a big difference - learn my personal risk factors   Follow Up Plan: Telephone follow up appointment with care management team member scheduled for: 01-15-2021 at 0900 am     Task: Alleviate Barriers to Coronary Artery Disease Therapy  Patient Care Plan: RNCM: Hypertension (Adult)    Problem Identified: RNCM: Hypertension (Hypertension)   Priority: Medium    Goal: RNCM: Hypertension Monitored   Priority: Medium  Note:   Objective:  . Last practice recorded BP readings:  BP Readings from Last 3 Encounters:  11/11/20 (!) 145/75  05/10/20 120/68  07/31/19 (!) 156/78 .   Marland Kitchen Most recent eGFR/CrCl: No results found for: EGFR  No components found for: CRCL Current Barriers:  Marland Kitchen Knowledge Deficits related to basic understanding of hypertension pathophysiology and self care management . Knowledge Deficits related to understanding of medications prescribed for management of hypertension . Limited Social Support . Lacks social connections . Does not contact provider office for questions/concerns Case Manager Clinical Goal(s):  Marland Kitchen Over the next 120 days, patient will verbalize understanding of plan for hypertension management . Over the next 120 days, patient will attend all scheduled medical appointments: 05-12-2021 . Over the next 120 days, patient will demonstrate improved adherence to prescribed treatment plan for hypertension as evidenced by taking all medications as prescribed, monitoring and recording blood pressure as directed, adhering to low sodium/DASH diet . Over the next 120 days, patient will demonstrate improved health management independence as evidenced by checking blood pressure as directed and notifying  PCP if SBP>160 or DBP > 90, taking all medications as prescribe, and adhering to a low sodium diet as discussed. . Over the next 120 days, patient will verbalize basic understanding of hypertension disease process and self health management plan as evidenced by compliance with medications, compliance with dietary restrictions, and working with the CCM team to manage health and well being  Interventions:  . Collaboration with Valerie Roys, DO regarding development and update of comprehensive plan of care as evidenced by provider attestation and co-signature . Inter-disciplinary care team collaboration (see longitudinal plan of care) . Evaluation of current treatment plan related to hypertension self management and patient's adherence to plan as established by provider. . Provided education to patient re: stroke prevention, s/s of heart attack and stroke, DASH diet, complications of uncontrolled blood pressure . Reviewed medications with patient and discussed importance of compliance . Discussed plans with patient for ongoing care management follow up and provided patient with direct contact information for care management team . Advised patient, providing education and rationale, to monitor blood pressure daily and record, calling PCP for findings outside established parameters.  . Reviewed scheduled/upcoming provider appointments including: 05-12-2021 Patient Goals:  Self-Care Activities: - Self administers medications as prescribed Attends all scheduled provider appointments Calls provider office for new concerns, questions, or BP outside discussed parameters Checks BP and records as discussed Follows a low sodium diet/DASH diet - blood pressure trends reviewed - depression screen reviewed - home or ambulatory blood pressure monitoring encouraged Follow Up Plan: Telephone follow up appointment with care management team member scheduled for: 01-15-2021 at 0900 am  Task: RNCM: Identify and  Monitor Blood Pressure Elevation   Note:   Care Management Activities:    - blood pressure trends reviewed - depression screen reviewed - home or ambulatory blood pressure monitoring encouraged       Patient Care Plan: RNCM: Depression (Adult) and Anxiety    Problem Identified: RNCM: Depression Identification (Depression) and anxiety   Priority: Medium    Goal: RNCM: Depressive Symptoms Identified/Anxiety triggers   Priority: Medium  Note:   Current Barriers:  . Chronic Disease Management support and education needs related to depression and anxiety  . Lacks caregiver support.  Leodis [place] social connections . Does not contact provider office for questions/concerns  Nurse Case Manager Clinical Goal(s):  . patient will verbalize understanding of plan for effective management of depression and anxiety  . patient will work with [facility] and pcp  to address needs related to increased anxiety and depression  . patient will demonstrate a decrease in anxiety and depression  exacerbations as evidenced by resolution of chest pain, normalized mood, improved health and well being . patient will attend all scheduled medical appointments: 05-12-2021  Interventions:  . 1:1 collaboration with Valerie Roys, DO regarding development and update of comprehensive plan of care as evidenced by provider attestation and co-signature . Inter-disciplinary care team collaboration (see longitudinal plan of care) . Evaluation of current treatment plan related to depression and anxiety  and patient's adherence to plan as established by provider. . Advised patient to call the office for changes in mood, increased anxiety or depression  . Provided education to patient re: pacing activity, getting rest, monitoring for triggers . Reviewed medications with patient and discussed compliance  . Provided patient with depression and anxiety  educational materials related to reducing stress and how increased anxiety and  depression contribute to negative outcomes with health and well being  . Discussed plans with patient for ongoing care management follow up and provided patient with direct contact information for care management team  Patient Goals/Self-Care Activities Over the next 120 days, patient will:  - Patient will self administer medications as prescribed Patient will attend all scheduled provider appointments Patient will call pharmacy for medication refills Patient will attend church or other social activities Patient will continue to perform ADL's independently Patient will continue to perform IADL's independently Patient will call provider office for new concerns or questions Patient will work with BSW to address care coordination needs and will continue to work with the clinical team to address health care and disease management related needs.    - anxiety screen reviewed - depression screen reviewed - medication list reviewed Follow Up Plan: Telephone follow up appointment with care management team member scheduled for: 01-15-2021 at 0900 am       Task: RNCM: Identify Depressive Symptoms and Facilitate Treatment   Note:   Care Management Activities:    - anxiety screen reviewed - depression screen reviewed - medication list reviewed         The patient verbalized understanding of instructions, educational materials, and care plan provided today and declined offer to receive copy of patient instructions, educational materials, and care plan.   Telephone follow up appointment with care management team member scheduled for: 01-15-2021 at 900 am  [place], MSN, [facility] Mobile: [phone]

## 2020-12-25 DIAGNOSIS — I208 Other forms of angina pectoris: Secondary | ICD-10-CM | POA: Diagnosis not present

## 2020-12-25 DIAGNOSIS — R0602 Shortness of breath: Secondary | ICD-10-CM | POA: Diagnosis not present

## 2020-12-31 ENCOUNTER — Other Ambulatory Visit
Admission: RE | Admit: 2020-12-31 | Discharge: 2020-12-31 | Disposition: A | Discharge status: Home / Self Care | Payer: Medicare Other | Source: Ambulatory Visit | Attending: Student | Admitting: Student

## 2020-12-31 DIAGNOSIS — I25118 Atherosclerotic heart disease of native coronary artery with other forms of angina pectoris: Secondary | ICD-10-CM | POA: Diagnosis not present

## 2020-12-31 DIAGNOSIS — E782 Mixed hyperlipidemia: Secondary | ICD-10-CM | POA: Diagnosis not present

## 2020-12-31 DIAGNOSIS — I700 Atherosclerosis of aorta: Secondary | ICD-10-CM | POA: Diagnosis not present

## 2020-12-31 DIAGNOSIS — I519 Heart disease, unspecified: Secondary | ICD-10-CM | POA: Diagnosis not present

## 2020-12-31 DIAGNOSIS — I10 Essential (primary) hypertension: Secondary | ICD-10-CM | POA: Diagnosis not present

## 2020-12-31 DIAGNOSIS — Z01818 Encounter for other preprocedural examination: Secondary | ICD-10-CM | POA: Insufficient documentation

## 2020-12-31 DIAGNOSIS — Z955 Presence of coronary angioplasty implant and graft: Secondary | ICD-10-CM | POA: Diagnosis not present

## 2020-12-31 DIAGNOSIS — Z720 Tobacco use: Secondary | ICD-10-CM | POA: Diagnosis not present

## 2020-12-31 DIAGNOSIS — I208 Other forms of angina pectoris: Secondary | ICD-10-CM | POA: Diagnosis not present

## 2020-12-31 LAB — BRAIN NATRIURETIC PEPTIDE: B Natriuretic Peptide: 105.6 pg/mL — ABNORMAL HIGH (ref 0.0–100.0)

## 2021-01-13 ENCOUNTER — Other Ambulatory Visit
Admission: RE | Admit: 2021-01-13 | Discharge: 2021-01-13 | Disposition: A | Discharge status: Home / Self Care | Payer: Medicare Other | Source: Ambulatory Visit | Attending: Internal Medicine | Admitting: Internal Medicine

## 2021-01-13 ENCOUNTER — Other Ambulatory Visit: Payer: Self-pay

## 2021-01-13 DIAGNOSIS — Z01812 Encounter for preprocedural laboratory examination: Secondary | ICD-10-CM | POA: Insufficient documentation

## 2021-01-13 DIAGNOSIS — Z20822 Contact with and (suspected) exposure to covid-19: Secondary | ICD-10-CM | POA: Insufficient documentation

## 2021-01-13 LAB — SARS CORONAVIRUS 2 (TAT 6-24 HRS): SARS Coronavirus 2: NEGATIVE

## 2021-01-15 ENCOUNTER — Telehealth: Payer: Self-pay | Admitting: General Practice

## 2021-01-15 ENCOUNTER — Ambulatory Visit (INDEPENDENT_AMBULATORY_CARE_PROVIDER_SITE_OTHER): Payer: Medicare Other | Admitting: General Practice

## 2021-01-15 DIAGNOSIS — F419 Anxiety disorder, unspecified: Secondary | ICD-10-CM

## 2021-01-15 DIAGNOSIS — I25110 Atherosclerotic heart disease of native coronary artery with unstable angina pectoris: Secondary | ICD-10-CM

## 2021-01-15 DIAGNOSIS — E782 Mixed hyperlipidemia: Secondary | ICD-10-CM

## 2021-01-15 DIAGNOSIS — I200 Unstable angina: Secondary | ICD-10-CM

## 2021-01-15 DIAGNOSIS — F339 Major depressive disorder, recurrent, unspecified: Secondary | ICD-10-CM | POA: Diagnosis not present

## 2021-01-15 DIAGNOSIS — I25119 Atherosclerotic heart disease of native coronary artery with unspecified angina pectoris: Secondary | ICD-10-CM

## 2021-01-15 DIAGNOSIS — I10 Essential (primary) hypertension: Secondary | ICD-10-CM

## 2021-01-15 DIAGNOSIS — R079 Chest pain, unspecified: Secondary | ICD-10-CM

## 2021-01-15 NOTE — Chronic Care Management (AMB) (Signed)
Chronic Care Management   CCM RN Visit Note  01/15/2021 Name: Tonya Cabrera MRN: [id number] DOB: 10-Jul-1951  Subjective: Tonya Cabrera is a 70 y.o. year old female who is a primary care patient of Valerie Roys, DO. The care management team was consulted for assistance with disease management and care coordination needs.    Engaged with patient by telephone for follow up visit in response to provider referral for case management and/or care coordination services.   Consent to Services:  The patient was given information about Chronic Care Management services, agreed to services, and gave verbal consent prior to initiation of services.  Please see initial visit note for detailed documentation.   Patient agreed to services and verbal consent obtained.   Assessment: Review of patient past medical history, allergies, medications, health status, including review of consultants reports, laboratory and other test data, was performed as part of comprehensive evaluation and provision of chronic care management services.   SDOH (Social Determinants of Health) assessments and interventions performed:    CCM Care Plan  No Known Allergies  Outpatient Encounter Medications as of 01/15/2021  Medication Sig  . alendronate (FOSAMAX) 70 MG tablet Take with a full glass of water on an empty stomach. (Patient taking differently: Take 70 mg by mouth every Sunday. Take with a full glass of water on an empty stomach.)  . aspirin 81 MG EC tablet TAKE 1 TABLET (81 MG TOTAL) BY MOUTH DAILY. (Patient taking differently: Take 81 mg by mouth daily. TAKE 1 TABLET (81 MG TOTAL) BY MOUTH DAILY.)  . atorvastatin (LIPITOR) 40 MG tablet Take 1 tablet (40 mg total) by mouth daily.  . cholecalciferol (VITAMIN D3) 25 MCG (1000 UNIT) tablet Take 1,000 Units by mouth daily.  . isosorbide mononitrate (IMDUR) 120 MG 24 hr tablet Take 120 mg by mouth daily.  Marland Kitchen lisinopril (ZESTRIL) 10 MG tablet Take 1 tablet (10 mg total)  by mouth daily.  . metoprolol succinate (TOPROL-XL) 25 MG 24 hr tablet Take 1 tablet (25 mg total) by mouth 2 (two) times daily.  . nitroGLYCERIN (NITROSTAT) 0.4 MG SL tablet Place 0.4 mg under the tongue every 5 (five) minutes as needed for chest pain.   No facility-administered encounter medications on file as of 01/15/2021.    Patient Active Problem List   Diagnosis Date Noted  . Overactive bladder 06/19/2019  . Urge incontinence of urine 06/19/2019  . Advance directive discussed with patient 05/02/2019  . Diverticulosis of large intestine without diverticulitis   . Depression, major, recurrent, in remission ([place]) 03/04/2018  . Symptomatic anemia 02/19/2018  . Personal history of tobacco use, presenting hazards to health 07/11/2016  . Aortic atherosclerosis ([place]) 07/09/2016  . Osteoporosis 07/07/2016  . Myopia of both eyes 06/16/2016  . Hyperlipidemia 06/08/2016  . CAD (coronary artery disease) 06/08/2016  . Snoring 06/08/2016  . Benign hypertensive renal disease 11/14/2015    Conditions to be addressed/monitored:CAD, HTN, HLD, Anxiety and Depression  Care Plan : RNCM: Coronary Artery Disease (Adult) and HLD  Updates made by Vanita Ingles since 01/15/2021 12:00 AM    Problem: RNCM: Disease Progression (Coronary Artery Disease)   Priority: Medium    Long-Range Goal: RNCM: Disease Progression Prevented or Minimized   Priority: Medium  Note:   Current Barriers:  . Poorly controlled hyperlipidemia, complicated by active chest pain- seeing pcp and specialist . Current antihyperlipidemic regimen: atorvastatin 40 mg QD . Most recent lipid panel:     Component Value Date/Time  CHOL 192 11/11/2020 1332   CHOL 235 (H) 06/08/2016 0953   CHOL 192 05/16/2014 0020   TRIG 208 (H) 11/11/2020 1332   TRIG 147 06/08/2016 0953   TRIG 106 05/16/2014 0020   HDL 57 11/11/2020 1332   HDL 48 05/16/2014 0020   CHOLHDL 3.3 04/09/2016 1828   VLDL 29 06/08/2016 0953   VLDL 21 05/16/2014  0020   LDLCALC 100 (H) 11/11/2020 1332   LDLCALC 123 (H) 05/16/2014 0020 .   Marland Kitchen ASCVD risk enhancing conditions: age >5, CAD, former MI,  HTN, former smoker . Unable to independently manage new onset of chest pain, HLD and CAD . Lacks social connections . Does not contact provider office for questions/concerns  RN Care Manager Clinical Goal(s):  Marland Kitchen Over the next 120 days, patient will work with Consulting civil engineer, providers, and care team towards execution of optimized self-health management plan . patient will verbalize understanding of plan for effective management of chest pain and discomfort and on going support of chronic conditions of CAD and HLD  . patient will work with RNCM, pcp and specialist  to address needs related to new onset of chest pain, HLD, and CAD . patient will demonstrate a decrease in chest pain  exacerbations as evidenced by finding etiology of chest pain, adherence to heart healthy diet and taking medications as ordered  . patient will attend all scheduled medical appointments: 05-12-2021 and having a heart catheterization on 01-29-2021.   Interventions: . Collaboration with Valerie Roys, DO regarding development and update of comprehensive plan of care as evidenced by provider attestation and co-signature . Inter-disciplinary care team collaboration (see longitudinal plan of care) . Medication review performed; medication list updated in electronic medical record.  Bertram Savin care team collaboration (see longitudinal plan of care) . Referred to pharmacy team for assistance with HLD and CAD medication management . Evaluation of current treatment plan related to HLD and CAD and patient's adherence to plan as established by provider. 01-15-2021: The patient is doing well and denies any concerns. She is having a heart catheterization on 01-29-2021. She says she is having to use the NTG more often. Stress test and other testing for heart function came back abnormal.  The patient just wants to get it over with and find out what is going on.  . Advised patient to call the office for changes in condition or questions, seek emergent care for Chest pain not resolved by NTG, if having chest pain call pcp or specialist and notify of increased incidence of chest pain or discomfort. 01-15-2021: The patient instructed to seek emergent care for unresolved chest pain with NTG. She has seen her cardiologist also and instructed also to seek emergent care for chest pain unresolved with taking NTG. Education also on pacing activity and limiting heavy work.  . Provided education to patient re: taking NTG for new onset of chest pain, seeking emergent care if needed, pacing activity, adherence to provider appointments, and eating heart healthy diet. 01-15-2021. Review with the patient  . Reviewed scheduled/upcoming provider appointments including: heart cath on 01-29-2021, pcp 05-12-2021 at 1:40 pm . Discussed plans with patient for ongoing care management follow up and provided patient with direct contact information for care management team   Patient Goals/Self-Care Activities: . Over the next 120 days, patient will:   - call for medicine refill 2 or 3 days before it runs out - call if I am sick and can't take my medicine - keep a list  of all the medicines I take; vitamins and herbals too - learn to read medicine labels - use a pillbox to sort medicine - use an alarm clock or phone to remind me to take my medicine - change to whole grain breads, cereal, pasta - drink 6 to 8 glasses of water each day - eat 3 to 5 servings of fruits and vegetables each day - eat 5 or 6 small meals each day - fill half the plate with nonstarchy vegetables - limit fast food meals to no more than 1 per week - manage portion size - prepare main meal at home 3 to 5 days each week - read food labels for fat, fiber, carbohydrates and portion size - be open to making changes - I can manage, know and  watch for signs of a heart attack - if I have chest pain, call for help -use NTG as needed for new onset of chest pain - learn about small changes that will make a big difference - learn my personal risk factors   Follow Up Plan: Telephone follow up appointment with care management team member scheduled for: 02-26-2021 at 10:15 am     Care Plan : RNCM: Hypertension (Adult)  Updates made by Vanita Ingles since 01/15/2021 12:00 AM    Problem: RNCM: Hypertension (Hypertension)   Priority: Medium    Long-Range Goal: RNCM: Hypertension Monitored   Priority: Medium  Note:   Objective:  . Last practice recorded BP readings:  . BP Readings from Last 3 Encounters: .  11/11/20 . (!) 145/75 .  05/10/20 . 120/68 .  07/31/19 . (!) 156/78 .    Marland Kitchen Most recent eGFR/CrCl: No results found for: EGFR  No components found for: CRCL Current Barriers:  Marland Kitchen Knowledge Deficits related to basic understanding of hypertension pathophysiology and self care management . Knowledge Deficits related to understanding of medications prescribed for management of hypertension . Limited Social Support . Lacks social connections . Does not contact provider office for questions/concerns Case Manager Clinical Goal(s):  Marland Kitchen Over the next 120 days, patient will verbalize understanding of plan for hypertension management . Over the next 120 days, patient will attend all scheduled medical appointments: 05-12-2021 . Over the next 120 days, patient will demonstrate improved adherence to prescribed treatment plan for hypertension as evidenced by taking all medications as prescribed, monitoring and recording blood pressure as directed, adhering to low sodium/DASH diet . Over the next 120 days, patient will demonstrate improved health management independence as evidenced by checking blood pressure as directed and notifying PCP if SBP>160 or DBP > 90, taking all medications as prescribe, and adhering to a low sodium diet as  discussed. . Over the next 120 days, patient will verbalize basic understanding of hypertension disease process and self health management plan as evidenced by compliance with medications, compliance with dietary restrictions, and working with the CCM team to manage health and well being  Interventions:  . Collaboration with Valerie Roys, DO regarding development and update of comprehensive plan of care as evidenced by provider attestation and co-signature . Inter-disciplinary care team collaboration (see longitudinal plan of care) . Evaluation of current treatment plan related to hypertension self management and patient's adherence to plan as established by provider. 01-15-2021: The patient states her blood pressure is doing well. She is eating good and monitoring sodium and fats. Will continue to monitor for changes.  . Provided education to patient re: stroke prevention, s/s of heart attack and stroke, DASH  diet, complications of uncontrolled blood pressure . Reviewed medications with patient and discussed importance of compliance. 01-15-2021- The patient is compliant with medications.  . Discussed plans with patient for ongoing care management follow up and provided patient with direct contact information for care management team . Advised patient, providing education and rationale, to monitor blood pressure daily and record, calling PCP for findings outside established parameters.  . Reviewed scheduled/upcoming provider appointments including: 05-12-2021 Patient Goals:  Self-Care Activities: - Self administers medications as prescribed Attends all scheduled provider appointments Calls provider office for new concerns, questions, or BP outside discussed parameters Checks BP and records as discussed Follows a low sodium diet/DASH diet - blood pressure trends reviewed - depression screen reviewed - home or ambulatory blood pressure monitoring encouraged Follow Up Plan: Telephone follow up  appointment with care management team member scheduled for: 02-26-2021 at 10:15 am    Care Plan : RNCM: Depression (Adult) and Anxiety  Updates made by Vanita Ingles since 01/15/2021 12:00 AM    Problem: RNCM: Depression Identification (Depression) and anxiety   Priority: Medium    Long-Range Goal: RNCM: Depressive Symptoms Identified/Anxiety triggers   Priority: Medium  Note:   Current Barriers:  . Chronic Disease Management support and education needs related to depression and anxiety  . Lacks caregiver support.  Leodis [place] social connections . Does not contact provider office for questions/concerns  Nurse Case Manager Clinical Goal(s):  . patient will verbalize understanding of plan for effective management of depression and anxiety  . patient will work with [facility] and pcp  to address needs related to increased anxiety and depression  . patient will demonstrate a decrease in anxiety and depression  exacerbations as evidenced by resolution of chest pain, normalized mood, improved health and well being . patient will attend all scheduled medical appointments: 05-12-2021  Interventions:  . 1:1 collaboration with Valerie Roys, DO regarding development and update of comprehensive plan of care as evidenced by provider attestation and co-signature . Inter-disciplinary care team collaboration (see longitudinal plan of care) . Evaluation of current treatment plan related to depression and anxiety  and patient's adherence to plan as established by provider. 01-15-2021: The patient is a little stressed because of her upcoming heart catheterization. She was supposed to have it today but they had to reschedule it. She states she is doing okay just wants to have it over. Is concerned because of the abnormal testing results and also having a heart attack about 6 years ago. The patient expressed her concerns and feelings. Emotional support given.  . Advised patient to call the office for changes in mood,  increased anxiety or depression  . Provided education to patient re: pacing activity, getting rest, monitoring for triggers . Reviewed medications with patient and discussed compliance  . Provided patient with depression and anxiety  educational materials related to reducing stress and how increased anxiety and depression contribute to negative outcomes with health and well being  . Discussed plans with patient for ongoing care management follow up and provided patient with direct contact information for care management team  Patient Goals/Self-Care Activities Over the next 120 days, patient will:  - Patient will self administer medications as prescribed Patient will attend all scheduled provider appointments Patient will call pharmacy for medication refills Patient will attend church or other social activities Patient will continue to perform ADL's independently Patient will continue to perform IADL's independently Patient will call provider office for new concerns or questions Patient will work  with BSW to address care coordination needs and will continue to work with the clinical team to address health care and disease management related needs.    - anxiety screen reviewed - depression screen reviewed - medication list reviewed Follow Up Plan: Telephone follow up appointment with care management team member scheduled for: 6-1--2022 at 1015 am         Plan:Telephone follow up appointment with care management team member scheduled for:  02-26-2021 at 10:15 am   [place], MSN, [facility] Mobile: [phone]

## 2021-01-15 NOTE — Patient Instructions (Signed)
Visit Information  PATIENT GOALS: Goals Addressed            This Visit's Progress   . RNCM: Cope with Pain-Coronary Artery Disease       Follow Up Date 02-26-2021   - learn how to meditate - learn relaxation techniques - practice relaxation or meditation daily - spend time with positive people - tell myself I can (not I can't) - think of new ways to do favorite things - use distraction techniques - use relaxation during pain    Why is this important?    Living with heart (cardiac) pain can be scary.   Feelings like depression or anxiety can make your pain worse.   Learning ways to cope may help with the fear and uncertainty that comes with heart pain.    Notes: Working with the pcp and specialist to determine why she is having chest pain. 01-15-2021: The patient is having a heart catheterization on 01-29-2021. Was supposed to have it today but they had to change the procedure. Knows to go to the ER if NTG does not help the chest pain. Verbalized understanding.     Marland Kitchen RNCM: Manage My Sleep Problems-Coronary Artery Disease       Timeframe:  Short-Term Goal Priority:  High Start Date:   11-20-2020                          Expected End Date:    03-27-2021                   Follow Up Date 02-26-2021   - develop a new routine to improve sleep - don't eat or exercise right before bedtime - eat healthy - get at least 7 to 8 hours of sleep at night - get outdoors every day (weather permitting) - keep room cool and dark - limit daytime naps - practice relaxation or meditation daily - use a fan or white noise in bedroom    Why is this important?    Sometimes you may have a hard time sleeping after you have a heart attack or surgery.   Feelings like depression or anxiety can make the sleep problems worse.   Many things can help you manage it and sleep better.    Notes: The patient has to take an OTC sleep aide to help her rest. She has used Ambien in the past        The  patient verbalized understanding of instructions, educational materials, and care plan provided today and declined offer to receive copy of patient instructions, educational materials, and care plan.   Telephone follow up appointment with care management team member scheduled for: 02-26-2021 at 10:15 am  [place], MSN, [facility] Mobile: [phone]

## 2021-01-29 DIAGNOSIS — I25110 Atherosclerotic heart disease of native coronary artery with unstable angina pectoris: Secondary | ICD-10-CM

## 2021-01-29 DIAGNOSIS — I200 Unstable angina: Secondary | ICD-10-CM

## 2021-02-06 ENCOUNTER — Encounter: Payer: Self-pay | Admitting: Internal Medicine

## 2021-02-06 ENCOUNTER — Encounter
Admission: RE | Disposition: A | Discharge status: Home / Self Care | Payer: Self-pay | Source: Home / Self Care | Attending: Internal Medicine

## 2021-02-06 ENCOUNTER — Other Ambulatory Visit: Payer: Self-pay

## 2021-02-06 ENCOUNTER — Ambulatory Visit
Admission: RE | Admit: 2021-02-06 | Discharge: 2021-02-06 | Disposition: A | Discharge status: Home / Self Care | Payer: Medicare Other | Attending: Internal Medicine | Admitting: Internal Medicine

## 2021-02-06 DIAGNOSIS — I252 Old myocardial infarction: Secondary | ICD-10-CM | POA: Insufficient documentation

## 2021-02-06 DIAGNOSIS — Z955 Presence of coronary angioplasty implant and graft: Secondary | ICD-10-CM | POA: Insufficient documentation

## 2021-02-06 DIAGNOSIS — F17200 Nicotine dependence, unspecified, uncomplicated: Secondary | ICD-10-CM | POA: Insufficient documentation

## 2021-02-06 DIAGNOSIS — I10 Essential (primary) hypertension: Secondary | ICD-10-CM | POA: Diagnosis not present

## 2021-02-06 DIAGNOSIS — E785 Hyperlipidemia, unspecified: Secondary | ICD-10-CM | POA: Diagnosis not present

## 2021-02-06 DIAGNOSIS — I200 Unstable angina: Secondary | ICD-10-CM

## 2021-02-06 DIAGNOSIS — I209 Angina pectoris, unspecified: Secondary | ICD-10-CM | POA: Diagnosis not present

## 2021-02-06 DIAGNOSIS — I2510 Atherosclerotic heart disease of native coronary artery without angina pectoris: Secondary | ICD-10-CM | POA: Diagnosis not present

## 2021-02-06 DIAGNOSIS — I25110 Atherosclerotic heart disease of native coronary artery with unstable angina pectoris: Secondary | ICD-10-CM | POA: Diagnosis not present

## 2021-02-06 HISTORY — PX: LEFT HEART CATH AND CORONARY ANGIOGRAPHY: CATH118249

## 2021-02-06 SURGERY — LEFT HEART CATH AND CORONARY ANGIOGRAPHY
Anesthesia: Moderate Sedation

## 2021-02-06 MED ORDER — SODIUM CHLORIDE 0.9 % IV SOLN: 250.0000 mL | INTRAVENOUS | Status: DC | PRN

## 2021-02-06 MED ORDER — SODIUM CHLORIDE 0.9% FLUSH: 3.0000 mL | INTRAVENOUS | Status: DC | PRN

## 2021-02-06 MED ORDER — SODIUM CHLORIDE 0.9 % WEIGHT BASED INFUSION: 1.0000 mL/kg/h | INTRAVENOUS | Status: DC

## 2021-02-06 MED ORDER — IOHEXOL 300 MG/ML  SOLN
INTRAMUSCULAR | Status: DC | PRN
  Administered 2021-02-06: 95 mL

## 2021-02-06 MED ORDER — MIDAZOLAM HCL 2 MG/2ML IJ SOLN
INTRAMUSCULAR | Status: AC
  Filled 2021-02-06: qty 2

## 2021-02-06 MED ORDER — MIDAZOLAM HCL 2 MG/2ML IJ SOLN
INTRAMUSCULAR | Status: DC | PRN
  Administered 2021-02-06 (×2): 1 mg via INTRAVENOUS

## 2021-02-06 MED ORDER — SODIUM CHLORIDE 0.9 % WEIGHT BASED INFUSION
3.0000 mL/kg/h | INTRAVENOUS | Status: AC
  Administered 2021-02-06: 3 mL/kg/h via INTRAVENOUS

## 2021-02-06 MED ORDER — SODIUM CHLORIDE 0.9% FLUSH: 3.0000 mL | Freq: Two times a day (BID) | INTRAVENOUS | Status: DC

## 2021-02-06 MED ORDER — VERAPAMIL HCL 2.5 MG/ML IV SOLN
INTRAVENOUS | Status: DC | PRN
  Administered 2021-02-06: 2.5 mg via INTRA_ARTERIAL

## 2021-02-06 MED ORDER — SODIUM CHLORIDE 0.9 % WEIGHT BASED INFUSION
1.0000 mL/kg/h | INTRAVENOUS | Status: DC
  Administered 2021-02-06: 1 mL/kg/h via INTRAVENOUS

## 2021-02-06 MED ORDER — LIDOCAINE HCL (PF) 1 % IJ SOLN
INTRAMUSCULAR | Status: DC | PRN
  Administered 2021-02-06: 30 mL

## 2021-02-06 MED ORDER — LABETALOL HCL 5 MG/ML IV SOLN: 10.0000 mg | INTRAVENOUS | Status: DC | PRN

## 2021-02-06 MED ORDER — HYDRALAZINE HCL 20 MG/ML IJ SOLN
10.0000 mg | INTRAMUSCULAR | Status: DC | PRN
Start: 2021-02-06 — End: 2021-02-06

## 2021-02-06 MED ORDER — HEPARIN (PORCINE) IN NACL 1000-0.9 UT/500ML-% IV SOLN
INTRAVENOUS | Status: DC | PRN
  Administered 2021-02-06: 1000 mL

## 2021-02-06 MED ORDER — VERAPAMIL HCL 2.5 MG/ML IV SOLN
INTRAVENOUS | Status: AC
  Filled 2021-02-06: qty 2

## 2021-02-06 MED ORDER — LIDOCAINE HCL (PF) 1 % IJ SOLN
INTRAMUSCULAR | Status: AC
  Filled 2021-02-06: qty 30

## 2021-02-06 MED ORDER — ASPIRIN 81 MG PO CHEW
CHEWABLE_TABLET | ORAL | Status: AC
  Administered 2021-02-06: 81 mg via ORAL
  Filled 2021-02-06: qty 1

## 2021-02-06 MED ORDER — HEPARIN (PORCINE) IN NACL 1000-0.9 UT/500ML-% IV SOLN
INTRAVENOUS | Status: AC
  Filled 2021-02-06: qty 1000

## 2021-02-06 MED ORDER — ONDANSETRON HCL 4 MG/2ML IJ SOLN: 4.0000 mg | Freq: Four times a day (QID) | INTRAMUSCULAR | Status: DC | PRN

## 2021-02-06 MED ORDER — FENTANYL CITRATE (PF) 100 MCG/2ML IJ SOLN
INTRAMUSCULAR | Status: DC | PRN
  Administered 2021-02-06 (×2): 25 ug via INTRAVENOUS

## 2021-02-06 MED ORDER — HEPARIN SODIUM (PORCINE) 1000 UNIT/ML IJ SOLN
INTRAMUSCULAR | Status: AC
  Filled 2021-02-06: qty 1

## 2021-02-06 MED ORDER — ASPIRIN 81 MG PO CHEW: 81.0000 mg | CHEWABLE_TABLET | ORAL | Status: AC

## 2021-02-06 MED ORDER — FENTANYL CITRATE (PF) 100 MCG/2ML IJ SOLN
INTRAMUSCULAR | Status: AC
  Filled 2021-02-06: qty 2

## 2021-02-06 MED ORDER — ACETAMINOPHEN 325 MG PO TABS: 650.0000 mg | ORAL_TABLET | ORAL | Status: DC | PRN

## 2021-02-06 SURGICAL SUPPLY — 19 items
CATH INFINITI 5 FR JL3.5 (CATHETERS) ×1 IMPLANT
CATH INFINITI 5FR JL4 (CATHETERS) ×1 IMPLANT
CATH INFINITI JR4 5F (CATHETERS) ×1 IMPLANT
COVER EZ STRL 42X30 (DRAPES) ×1 IMPLANT
DEVICE CLOSURE MYNXGRIP 5F (Vascular Products) ×1 IMPLANT
DEVICE RAD TR BAND REGULAR (VASCULAR PRODUCTS) ×1 IMPLANT
DRAPE BRACHIAL (DRAPES) ×1 IMPLANT
GLIDESHEATH SLEND SS 6F .021 (SHEATH) ×1 IMPLANT
GUIDEWIRE INQWIRE 1.5J.035X260 (WIRE) IMPLANT
INQWIRE 1.5J .035X260CM (WIRE) ×2
NDL PERC 18GX7CM (NEEDLE) IMPLANT
NEEDLE PERC 18GX7CM (NEEDLE) ×2 IMPLANT
PACK CARDIAC CATH (CUSTOM PROCEDURE TRAY) ×2 IMPLANT
PROTECTION STATION PRESSURIZED (MISCELLANEOUS) ×2
SET ATX SIMPLICITY (MISCELLANEOUS) ×2 IMPLANT
SHEATH AVANTI 5FR X 11CM (SHEATH) ×1 IMPLANT
STATION PROTECTION PRESSURIZED (MISCELLANEOUS) IMPLANT
WIRE GUIDERIGHT .035X150 (WIRE) ×1 IMPLANT
WIRE HITORQ VERSACORE ST 145CM (WIRE) ×1 IMPLANT

## 2021-02-06 NOTE — Progress Notes (Signed)
[facility]    SUBJECTIVE: Patient presents for cardiac cath had evaluation in the past including office visit echocardiogram EF around 50% she has had non-STEMI in the past with PCI and stent to circumflex hypertension hyperlipidemia still having some anginal symptoms have been on Imdur metoprolol now presents for cardiac cath vital signs and exam is unchanged   Vitals:   02/06/21 1124  BP: (!) 126/52  Pulse: 61  Resp: 18  Temp: 97.7 F (36.5 C)  SpO2: 100%  Weight: 64.9 kg  Height: 5\' 3"  (1.6 m)    No intake or output data in the 24 hours ending 02/06/21 1207    PHYSICAL EXAM  General: Well developed, well nourished, in no acute distress HEENT:  Normocephalic and atramatic Neck:  No JVD.  Lungs: Clear bilaterally to auscultation and percussion. Heart: HRRR . Normal S1 and S2 without gallops or murmurs.  Abdomen: Bowel sounds are positive, abdomen soft and non-tender  Msk:  Back normal, normal gait. Normal strength and tone for age. Extremities: No clubbing, cyanosis or edema.   Neuro: Alert and oriented X 3. Psych:  Good affect, responds appropriately   LABS: Basic Metabolic Panel: No results for input(s): NA, K, CL, CO2, GLUCOSE, BUN, CREATININE, CALCIUM, MG, PHOS in the last 72 hours. Liver Function Tests: No results for input(s): AST, ALT, ALKPHOS, BILITOT, PROT, ALBUMIN in the last 72 hours. No results for input(s): LIPASE, AMYLASE in the last 72 hours. CBC: No results for input(s): WBC, NEUTROABS, HGB, HCT, MCV, PLT in the last 72 hours. Cardiac Enzymes: No results for input(s): CKTOTAL, CKMB, CKMBINDEX, TROPONINI in the last 72 hours. BNP: Invalid input(s): POCBNP D-Dimer: No results for input(s): DDIMER in the last 72 hours. Hemoglobin A1C: No results for input(s): HGBA1C in the last 72 hours. Fasting Lipid Panel: No results for input(s): CHOL, HDL, LDLCALC, TRIG, CHOLHDL, LDLDIRECT in the last 72 hours. Thyroid Function Tests: No results for  input(s): TSH, T4TOTAL, T3FREE, THYROIDAB in the last 72 hours.  Invalid input(s): FREET3 Anemia Panel: No results for input(s): VITAMINB12, FOLATE, FERRITIN, TIBC, IRON, RETICCTPCT in the last 72 hours.  No results found.   Echo EF around 50%  TELEMETRY: Normal sinus rhythm  ASSESSMENT AND PLAN:  Active Problems:   * No active hospital problems. *     Angina Coronary artery disease History of PCI and stent 2015 DES stent placed to circumflex History of non-STEMI Hypertension Hyperlipidemia Smoking  Plan Recommend cardiac cath for assessment of angina known coronary disease multiple other risk factors Appears to be unchanged from previous office evaluation and can proceed with cardiac catheter    Yolonda Kida, MD 02/06/2021 12:07 PM

## 2021-02-07 ENCOUNTER — Encounter: Payer: Self-pay | Admitting: Internal Medicine

## 2021-02-13 DIAGNOSIS — I10 Essential (primary) hypertension: Secondary | ICD-10-CM | POA: Diagnosis not present

## 2021-02-13 DIAGNOSIS — E782 Mixed hyperlipidemia: Secondary | ICD-10-CM | POA: Diagnosis not present

## 2021-02-13 DIAGNOSIS — I25118 Atherosclerotic heart disease of native coronary artery with other forms of angina pectoris: Secondary | ICD-10-CM | POA: Diagnosis not present

## 2021-02-13 DIAGNOSIS — I2582 Chronic total occlusion of coronary artery: Secondary | ICD-10-CM | POA: Diagnosis not present

## 2021-02-20 DIAGNOSIS — I208 Other forms of angina pectoris: Secondary | ICD-10-CM | POA: Diagnosis not present

## 2021-02-20 DIAGNOSIS — I519 Heart disease, unspecified: Secondary | ICD-10-CM | POA: Diagnosis not present

## 2021-02-20 DIAGNOSIS — I700 Atherosclerosis of aorta: Secondary | ICD-10-CM | POA: Diagnosis not present

## 2021-02-20 DIAGNOSIS — Z720 Tobacco use: Secondary | ICD-10-CM | POA: Diagnosis not present

## 2021-02-20 DIAGNOSIS — I10 Essential (primary) hypertension: Secondary | ICD-10-CM | POA: Diagnosis not present

## 2021-02-20 DIAGNOSIS — I25118 Atherosclerotic heart disease of native coronary artery with other forms of angina pectoris: Secondary | ICD-10-CM | POA: Diagnosis not present

## 2021-02-20 DIAGNOSIS — Z955 Presence of coronary angioplasty implant and graft: Secondary | ICD-10-CM | POA: Diagnosis not present

## 2021-02-20 DIAGNOSIS — E782 Mixed hyperlipidemia: Secondary | ICD-10-CM | POA: Diagnosis not present

## 2021-02-26 ENCOUNTER — Telehealth: Payer: Self-pay | Admitting: General Practice

## 2021-02-26 ENCOUNTER — Ambulatory Visit (INDEPENDENT_AMBULATORY_CARE_PROVIDER_SITE_OTHER): Payer: Medicare Other

## 2021-02-26 DIAGNOSIS — F339 Major depressive disorder, recurrent, unspecified: Secondary | ICD-10-CM

## 2021-02-26 DIAGNOSIS — I25119 Atherosclerotic heart disease of native coronary artery with unspecified angina pectoris: Secondary | ICD-10-CM | POA: Diagnosis not present

## 2021-02-26 DIAGNOSIS — I10 Essential (primary) hypertension: Secondary | ICD-10-CM | POA: Diagnosis not present

## 2021-02-26 NOTE — Chronic Care Management (AMB) (Signed)
Chronic Care Management   CCM RN Visit Note  02/26/2021 Name: Tonya Cabrera MRN: [id number] DOB: Feb 14, 1951  Subjective: Tonya Cabrera is a 70 y.o. year old female who is a primary care patient of Tonya Carrow, DO. The care management team was consulted for assistance with disease management and care coordination needs.    Engaged with patient by telephone for follow up visit in response to provider referral for case management and/or care coordination services.   Consent to Services:  The patient was given information about Chronic Care Management services, agreed to services, and gave verbal consent prior to initiation of services.  Please see initial visit note for detailed documentation.   Patient agreed to services and verbal consent obtained.   Assessment: Review of patient past medical history, allergies, medications, health status, including review of consultants reports, laboratory and other test data, was performed as part of comprehensive evaluation and provision of chronic care management services.   SDOH (Social Determinants of Health) assessments and interventions performed:    CCM Care Plan  No Known Allergies  Outpatient Encounter Medications as of 02/26/2021  Medication Sig  . alendronate (FOSAMAX) 70 MG tablet Take with a full glass of water on an empty stomach. (Patient taking differently: Take 70 mg by mouth every Sunday. Take with a full glass of water on an empty stomach.)  . aspirin 81 MG EC tablet TAKE 1 TABLET (81 MG TOTAL) BY MOUTH DAILY. (Patient taking differently: Take 81 mg by mouth daily. TAKE 1 TABLET (81 MG TOTAL) BY MOUTH DAILY.)  . atorvastatin (LIPITOR) 40 MG tablet Take 1 tablet (40 mg total) by mouth daily.  . cholecalciferol (VITAMIN D3) 25 MCG (1000 UNIT) tablet Take 1,000 Units by mouth daily. (Patient not taking: Reported on 02/06/2021)  . isosorbide mononitrate (IMDUR) 120 MG 24 hr tablet Take 120 mg by mouth daily.  Tonya Cabrera lisinopril (ZESTRIL)  10 MG tablet Take 1 tablet (10 mg total) by mouth daily.  . metoprolol succinate (TOPROL-XL) 25 MG 24 hr tablet Take 1 tablet (25 mg total) by mouth 2 (two) times daily.  . nitroGLYCERIN (NITROSTAT) 0.4 MG SL tablet Place 0.4 mg under the tongue every 5 (five) minutes as needed for chest pain.   No facility-administered encounter medications on file as of 02/26/2021.    Patient Active Problem List   Diagnosis Date Noted  . Overactive bladder 06/19/2019  . Urge incontinence of urine 06/19/2019  . Advance directive discussed with patient 05/02/2019  . Diverticulosis of large intestine without diverticulitis   . Depression, major, recurrent, in remission (HCC) 03/04/2018  . Symptomatic anemia 02/19/2018  . Personal history of tobacco use, presenting hazards to health 07/11/2016  . Aortic atherosclerosis (HCC) 07/09/2016  . Osteoporosis 07/07/2016  . Myopia of both eyes 06/16/2016  . Hyperlipidemia 06/08/2016  . CAD (coronary artery disease) 06/08/2016  . Snoring 06/08/2016  . Benign hypertensive renal disease 11/14/2015    Conditions to be addressed/monitored:CAD, HTN, HLD and Depression  Care Plan : RNCM: Coronary Artery Disease (Adult) and HLD  Updates made by Leane Call, RN since 02/26/2021 12:00 AM    Problem: RNCM: Disease Progression (Coronary Artery Disease)   Priority: Medium    Long-Range Goal: RNCM: Disease Progression Prevented or Minimized   Priority: Medium  Note:   Current Barriers:  . Poorly controlled hyperlipidemia, complicated by active chest pain- seeing pcp and specialist . Current antihyperlipidemic regimen: atorvastatin 40 mg QD . Most recent lipid panel:  Component Value Date/Time   CHOL 192 11/11/2020 1332   CHOL 235 (H) 06/08/2016 0953   CHOL 192 05/16/2014 0020   TRIG 208 (H) 11/11/2020 1332   TRIG 147 06/08/2016 0953   TRIG 106 05/16/2014 0020   HDL 57 11/11/2020 1332   HDL 48 05/16/2014 0020   CHOLHDL 3.3 04/09/2016 1828   VLDL 29  06/08/2016 0953   VLDL 21 05/16/2014 0020   LDLCALC 100 (H) 11/11/2020 1332   LDLCALC 123 (H) 05/16/2014 0020 .   Tonya Cabrera ASCVD risk enhancing conditions: age >24, CAD, former MI,  HTN, former smoker . Unable to independently manage new onset of chest pain, HLD and CAD . Lacks social connections . Does not contact provider office for questions/concerns  RN Care Manager Clinical Goal(s):  Tonya Cabrera Over the next 120 days, patient will work with Consulting civil engineer, providers, and care team towards execution of optimized self-health management plan . patient will verbalize understanding of plan for effective management of chest pain and discomfort and on going support of chronic conditions of CAD and HLD  . patient will work with RNCM, pcp and specialist  to address needs related to new onset of chest pain, HLD, and CAD . patient will demonstrate a decrease in chest pain  exacerbations as evidenced by finding etiology of chest pain, adherence to heart healthy diet and taking medications as ordered  . patient will attend all scheduled medical appointments: PCP appointment on 05-12-2021 and Coronary Angioplasty on 6-21--2022.   Interventions: . Collaboration with Valerie Roys, DO regarding development and update of comprehensive plan of care as evidenced by provider attestation and co-signature . Inter-disciplinary care team collaboration (see longitudinal plan of care) . Medication review performed; medication list updated in electronic medical record.  Bertram Savin care team collaboration (see longitudinal plan of care) . Referred to pharmacy team for assistance with HLD and CAD medication management . Evaluation of current treatment plan related to HLD and CAD and patient's adherence to plan as established by provider. 01-15-2021: The patient is doing well and denies any concerns. She is having a heart catheterization on 01-29-2021. She says she is having to use the NTG more often. Stress test and other  testing for heart function came back abnormal. The patient just wants to get it over with and find out what is going on. 02/26/2021 Patient had Coronary Angiography procedure done on 02/06/2021.  Next step is for patient to have Coronary Angioplasty performed on 03/18/2021 with possible future by-pass surgery if needed. Patient continues to have chest pain with activity and uses NTG tabs PRN.  Pt verbalized understanding of NTG tab use and when to activate 911 for unrelieved chest pain with NTG use. . Advised patient to call the office for changes in condition or questions, seek emergent care for Chest pain not resolved by NTG, if having chest pain call pcp or specialist and notify of increased incidence of chest pain or discomfort. 01-15-2021: The patient instructed to seek emergent care for unresolved chest pain with NTG. She has seen her cardiologist also and instructed also to seek emergent care for chest pain unresolved with taking NTG. Education also on pacing activity and limiting heavy work. 02/26/2021 Reviewed this education with the patient. . Provided education to patient re: taking NTG for new onset of chest pain, seeking emergent care if needed, pacing activity, adherence to provider appointments, and eating heart healthy diet. 01-15-2021. Review with the patient 02/26/2021 Reviewed this information with the patient. Tonya Cabrera  Reviewed scheduled/upcoming provider appointments including: PCP on 05-12-2021 at 1:40 pm and Coronary Angioplasty procedure on 03/18/2021. Tonya Cabrera Discussed plans with patient for ongoing care management follow up and provided patient with direct contact information for care management team   Patient Goals/Self-Care Activities: . Over the next 120 days, patient will:   - call for medicine refill 2 or 3 days before it runs out - call if I am sick and can't take my medicine - keep a list of all the medicines I take; vitamins and herbals too - learn to read medicine labels - use a pillbox to  sort medicine - use an alarm clock or phone to remind me to take my medicine - change to whole grain breads, cereal, pasta - drink 6 to 8 glasses of water each day - eat 3 to 5 servings of fruits and vegetables each day - eat 5 or 6 small meals each day - fill half the plate with nonstarchy vegetables - limit fast food meals to no more than 1 per week - manage portion size - prepare main meal at home 3 to 5 days each week - read food labels for fat, fiber, carbohydrates and portion size - be open to making changes - I can manage, know and watch for signs of a heart attack - if I have chest pain, call for help -use NTG as needed for new onset of chest pain - learn about small changes that will make a big difference - learn my personal risk factors   Follow Up Plan: Telephone follow up appointment with care management team member scheduled for: 06-04-2021 at 9:00am     Care Plan : RNCM: Hypertension (Adult)  Updates made by Inge Rise, RN since 02/26/2021 12:00 AM    Problem: RNCM: Hypertension (Hypertension)   Priority: Medium    Long-Range Goal: RNCM: Hypertension Monitored   Priority: Medium  Note:   Objective:  . Last practice recorded BP readings:  . BP Readings from Last 3 Encounters: .  02/06/21 . 121/77 .  11/11/20 . (!) 145/75 .  05/10/20 . 120/68 .     Tonya Cabrera Most recent eGFR/CrCl: No results found for: EGFR  No components found for: CRCL Current Barriers:  Tonya Cabrera Knowledge Deficits related to basic understanding of hypertension pathophysiology and self care management . Knowledge Deficits related to understanding of medications prescribed for management of hypertension . Limited Social Support . Lacks social connections . Does not contact provider office for questions/concerns Case Manager Clinical Goal(s):  Tonya Cabrera Over the next 120 days, patient will verbalize understanding of plan for hypertension management . Over the next 120 days, patient will attend all scheduled  medical appointments: 05-12-2021 . Over the next 120 days, patient will demonstrate improved adherence to prescribed treatment plan for hypertension as evidenced by taking all medications as prescribed, monitoring and recording blood pressure as directed, adhering to low sodium/DASH diet . Over the next 120 days, patient will demonstrate improved health management independence as evidenced by checking blood pressure as directed and notifying PCP if SBP>160 or DBP > 90, taking all medications as prescribe, and adhering to a low sodium diet as discussed. . Over the next 120 days, patient will verbalize basic understanding of hypertension disease process and self health management plan as evidenced by compliance with medications, compliance with dietary restrictions, and working with the CCM team to manage health and well being  Interventions:  . Collaboration with Valerie Roys, DO regarding development and update of  comprehensive plan of care as evidenced by provider attestation and co-signature . Inter-disciplinary care team collaboration (see longitudinal plan of care) . Evaluation of current treatment plan related to hypertension self management and patient's adherence to plan as established by provider. 01-15-2021: The patient states her blood pressure is doing well. She is eating good and monitoring sodium and fats. Will continue to monitor for changes. 02/26/2021: The patient is not checking her blood pressure on a regular basis but will check it periodically, especially when she feels light-headed.  Otherwise she thinks her blood pressure is doing well. . Provided education to patient re: stroke prevention, s/s of heart attack and stroke, DASH diet, complications of uncontrolled blood pressure. 02/26/2021: Reviewed this education with the patient. Patient verbalized understanding. . Reviewed medications with patient and discussed importance of compliance. 01-15-2021- The patient is compliant with  medications. 02/26/2021: The patient continues to be compliant with her medications. . Discussed plans with patient for ongoing care management follow up and provided patient with direct contact information for care management team . Advised patient, providing education and rationale, to monitor blood pressure daily and record, calling PCP for findings outside established parameters.  . Reviewed scheduled/upcoming provider appointments including: 05-12-2021 at 1:40pm Patient Goals:  Self-Care Activities: - Self administers medications as prescribed Attends all scheduled provider appointments Calls provider office for new concerns, questions, or BP outside discussed parameters Checks BP and records as discussed Follows a low sodium diet/DASH diet - blood pressure trends reviewed - depression screen reviewed - home or ambulatory blood pressure monitoring encouraged Follow Up Plan: Telephone follow up appointment with care management team member scheduled for: 06-04-2021 at [place] : RNCM: Depression (Adult) and Anxiety  Updates made by Inge Rise, RN since 02/26/2021 12:00 AM    Problem: RNCM: Depression Identification (Depression) and anxiety   Priority: Medium    Long-Range Goal: RNCM: Depressive Symptoms Identified/Anxiety triggers   Priority: Medium  Note:   Current Barriers:  . Chronic Disease Management support and education needs related to depression and anxiety  . Lacks caregiver support.  Leodis Liverpool social connections . Does not contact provider office for questions/concerns  Nurse Case Manager Clinical Goal(s):  . patient will verbalize understanding of plan for effective management of depression and anxiety  . patient will work with [facility] and pcp  to address needs related to increased anxiety and depression  . patient will demonstrate a decrease in anxiety and depression  exacerbations as evidenced by resolution of chest pain, normalized mood, improved health  and well being . patient will attend all scheduled medical appointments: 05-12-2021  Interventions:  . 1:1 collaboration with Valerie Roys, DO regarding development and update of comprehensive plan of care as evidenced by provider attestation and co-signature . Inter-disciplinary care team collaboration (see longitudinal plan of care) . Evaluation of current treatment plan related to depression and anxiety  and patient's adherence to plan as established by provider. 01-15-2021: The patient is a little stressed because of her upcoming heart catheterization. She was supposed to have it today but they had to reschedule it. She states she is doing okay just wants to have it over. Is concerned because of the abnormal testing results and also having a heart attack about 6 years ago. The patient expressed her concerns and feelings. Emotional support given. 02/26/2021 The patient feels she is doing ok presently and just awaiting upcoming angioplasty procedure on 03/18/2021.  She enjoys working in her vegetable and  flower gardens which bring her joy.  Patient states she is not a good sleeper and uses OTC low dose sleeping aid plus Melatonin to sleep a few hours each night. It appears patient is in a good place currently.  . Advised patient to call the office for changes in mood, increased anxiety or depression  . Provided education to patient re: pacing activity, getting rest, monitoring for triggers . Reviewed medications with patient and discussed compliance  . Provided patient with depression and anxiety  educational materials related to reducing stress and how increased anxiety and depression contribute to negative outcomes with health and well being  . Discussed plans with patient for ongoing care management follow up and provided patient with direct contact information for care management team  Patient Goals/Self-Care Activities Over the next 120 days, patient will:  - Patient will self administer  medications as prescribed Patient will attend all scheduled provider appointments Patient will call pharmacy for medication refills Patient will attend church or other social activities Patient will continue to perform ADL's independently Patient will continue to perform IADL's independently Patient will call provider office for new concerns or questions Patient will work with BSW to address care coordination needs and will continue to work with the clinical team to address health care and disease management related needs.    - anxiety screen reviewed - depression screen reviewed - medication list reviewed Follow Up Plan: Telephone follow up appointment with care management team member scheduled for: 06-04-2021 at 9:00am         Plan:Telephone follow up appointment with care management team member scheduled for:  06-04-2021 at [place] RN, [facility] Mobile: [phone]

## 2021-02-26 NOTE — Patient Instructions (Signed)
Patient Care Plan: RNCM: Coronary Artery Disease (Adult) and HLD    Problem Identified: RNCM: Disease Progression (Coronary Artery Disease)   Priority: Medium    Long-Range Goal: RNCM: Disease Progression Prevented or Minimized   Priority: Medium  Note:   Current Barriers:  . Poorly controlled hyperlipidemia, complicated by active chest pain- seeing pcp and specialist . Current antihyperlipidemic regimen: atorvastatin 40 mg QD . Most recent lipid panel:     Component Value Date/Time   CHOL 192 11/11/2020 1332   CHOL 235 (H) 06/08/2016 0953   CHOL 192 05/16/2014 0020   TRIG 208 (H) 11/11/2020 1332   TRIG 147 06/08/2016 0953   TRIG 106 05/16/2014 0020   HDL 57 11/11/2020 1332   HDL 48 05/16/2014 0020   CHOLHDL 3.3 04/09/2016 1828   VLDL 29 06/08/2016 0953   VLDL 21 05/16/2014 0020   LDLCALC 100 (H) 11/11/2020 1332   LDLCALC 123 (H) 05/16/2014 0020 .   Marland Kitchen ASCVD risk enhancing conditions: age >58, CAD, former MI,  HTN, former smoker . Unable to independently manage new onset of chest pain, HLD and CAD . Lacks social connections . Does not contact provider office for questions/concerns  RN Care Manager Clinical Goal(s):  Marland Kitchen Over the next 120 days, patient will work with Consulting civil engineer, providers, and care team towards execution of optimized self-health management plan . patient will verbalize understanding of plan for effective management of chest pain and discomfort and on going support of chronic conditions of CAD and HLD  . patient will work with RNCM, pcp and specialist  to address needs related to new onset of chest pain, HLD, and CAD . patient will demonstrate a decrease in chest pain  exacerbations as evidenced by finding etiology of chest pain, adherence to heart healthy diet and taking medications as ordered  . patient will attend all scheduled medical appointments: PCP appointment on 05-12-2021 and Coronary Angioplasty on 6-21--2022.   Interventions: . Collaboration with  Valerie Roys, DO regarding development and update of comprehensive plan of care as evidenced by provider attestation and co-signature . Inter-disciplinary care team collaboration (see longitudinal plan of care) . Medication review performed; medication list updated in electronic medical record.  Bertram Savin care team collaboration (see longitudinal plan of care) . Referred to pharmacy team for assistance with HLD and CAD medication management . Evaluation of current treatment plan related to HLD and CAD and patient's adherence to plan as established by provider. 01-15-2021: The patient is doing well and denies any concerns. She is having a heart catheterization on 01-29-2021. She says she is having to use the NTG more often. Stress test and other testing for heart function came back abnormal. The patient just wants to get it over with and find out what is going on. 02/26/2021 Patient had Coronary Angiography procedure done on 02/06/2021.  Next step is for patient to have Coronary Angioplasty performed on 03/18/2021 with possible future by-pass surgery if needed. Patient continues to have chest pain with activity and uses NTG tabs PRN.  Pt verbalized understanding of NTG tab use and when to activate 911 for unrelieved chest pain with NTG use. . Advised patient to call the office for changes in condition or questions, seek emergent care for Chest pain not resolved by NTG, if having chest pain call pcp or specialist and notify of increased incidence of chest pain or discomfort. 01-15-2021: The patient instructed to seek emergent care for unresolved chest pain with NTG. She has seen  her cardiologist also and instructed also to seek emergent care for chest pain unresolved with taking NTG. Education also on pacing activity and limiting heavy work. 02/26/2021 Reviewed this education with the patient. . Provided education to patient re: taking NTG for new onset of chest pain, seeking emergent care if needed,  pacing activity, adherence to provider appointments, and eating heart healthy diet. 01-15-2021. Review with the patient 02/26/2021 Reviewed this information with the patient. . Reviewed scheduled/upcoming provider appointments including: PCP on 05-12-2021 at 1:40 pm and Coronary Angioplasty procedure on 03/18/2021. Marland Kitchen Discussed plans with patient for ongoing care management follow up and provided patient with direct contact information for care management team   Patient Goals/Self-Care Activities: . Over the next 120 days, patient will:   - call for medicine refill 2 or 3 days before it runs out - call if I am sick and can't take my medicine - keep a list of all the medicines I take; vitamins and herbals too - learn to read medicine labels - use a pillbox to sort medicine - use an alarm clock or phone to remind me to take my medicine - change to whole grain breads, cereal, pasta - drink 6 to 8 glasses of water each day - eat 3 to 5 servings of fruits and vegetables each day - eat 5 or 6 small meals each day - fill half the plate with nonstarchy vegetables - limit fast food meals to no more than 1 per week - manage portion size - prepare main meal at home 3 to 5 days each week - read food labels for fat, fiber, carbohydrates and portion size - be open to making changes - I can manage, know and watch for signs of a heart attack - if I have chest pain, call for help -use NTG as needed for new onset of chest pain - learn about small changes that will make a big difference - learn my personal risk factors   Follow Up Plan: Telephone follow up appointment with care management team member scheduled for: 06-04-2021 at 9:00am     Patient Care Plan: RNCM: Hypertension (Adult)    Problem Identified: RNCM: Hypertension (Hypertension)   Priority: Medium    Long-Range Goal: RNCM: Hypertension Monitored   Priority: Medium  Note:   Objective:  . Last practice recorded BP readings:  . BP Readings  from Last 3 Encounters: .  02/06/21 . 121/77 .  11/11/20 . (!) 145/75 .  05/10/20 . 120/68 .     Marland Kitchen Most recent eGFR/CrCl: No results found for: EGFR  No components found for: CRCL Current Barriers:  Marland Kitchen Knowledge Deficits related to basic understanding of hypertension pathophysiology and self care management . Knowledge Deficits related to understanding of medications prescribed for management of hypertension . Limited Social Support . Lacks social connections . Does not contact provider office for questions/concerns Case Manager Clinical Goal(s):  Marland Kitchen Over the next 120 days, patient will verbalize understanding of plan for hypertension management . Over the next 120 days, patient will attend all scheduled medical appointments: 05-12-2021 . Over the next 120 days, patient will demonstrate improved adherence to prescribed treatment plan for hypertension as evidenced by taking all medications as prescribed, monitoring and recording blood pressure as directed, adhering to low sodium/DASH diet . Over the next 120 days, patient will demonstrate improved health management independence as evidenced by checking blood pressure as directed and notifying PCP if SBP>160 or DBP > 90, taking all medications as prescribe, and  adhering to a low sodium diet as discussed. . Over the next 120 days, patient will verbalize basic understanding of hypertension disease process and self health management plan as evidenced by compliance with medications, compliance with dietary restrictions, and working with the CCM team to manage health and well being  Interventions:  . Collaboration with Valerie Roys, DO regarding development and update of comprehensive plan of care as evidenced by provider attestation and co-signature . Inter-disciplinary care team collaboration (see longitudinal plan of care) . Evaluation of current treatment plan related to hypertension self management and patient's adherence to plan as established  by provider. 01-15-2021: The patient states her blood pressure is doing well. She is eating good and monitoring sodium and fats. Will continue to monitor for changes. 02/26/2021: The patient is not checking her blood pressure on a regular basis but will check it periodically, especially when she feels light-headed.  Otherwise she thinks her blood pressure is doing well. . Provided education to patient re: stroke prevention, s/s of heart attack and stroke, DASH diet, complications of uncontrolled blood pressure. 02/26/2021: Reviewed this education with the patient. Patient verbalized understanding. . Reviewed medications with patient and discussed importance of compliance. 01-15-2021- The patient is compliant with medications. 02/26/2021: The patient continues to be compliant with her medications. . Discussed plans with patient for ongoing care management follow up and provided patient with direct contact information for care management team . Advised patient, providing education and rationale, to monitor blood pressure daily and record, calling PCP for findings outside established parameters.  . Reviewed scheduled/upcoming provider appointments including: 05-12-2021 at 1:40pm Patient Goals:  Self-Care Activities: - Self administers medications as prescribed Attends all scheduled provider appointments Calls provider office for new concerns, questions, or BP outside discussed parameters Checks BP and records as discussed Follows a low sodium diet/DASH diet - blood pressure trends reviewed - depression screen reviewed - home or ambulatory blood pressure monitoring encouraged Follow Up Plan: Telephone follow up appointment with care management team member scheduled for: 06-04-2021 at 9:00am    Task: RNCM: Identify and Monitor Blood Pressure Elevation   Note:   Care Management Activities:    - blood pressure trends reviewed - depression screen reviewed - home or ambulatory blood pressure monitoring  encouraged       Patient Care Plan: RNCM: Depression (Adult) and Anxiety    Problem Identified: RNCM: Depression Identification (Depression) and anxiety   Priority: Medium    Long-Range Goal: RNCM: Depressive Symptoms Identified/Anxiety triggers   Priority: Medium  Note:   Current Barriers:  . Chronic Disease Management support and education needs related to depression and anxiety  . Lacks caregiver support.  Leodis Liverpool social connections . Does not contact provider office for questions/concerns  Nurse Case Manager Clinical Goal(s):  . patient will verbalize understanding of plan for effective management of depression and anxiety  . patient will work with [facility] and pcp  to address needs related to increased anxiety and depression  . patient will demonstrate a decrease in anxiety and depression  exacerbations as evidenced by resolution of chest pain, normalized mood, improved health and well being . patient will attend all scheduled medical appointments: 05-12-2021  Interventions:  . 1:1 collaboration with Valerie Roys, DO regarding development and update of comprehensive plan of care as evidenced by provider attestation and co-signature . Inter-disciplinary care team collaboration (see longitudinal plan of care) . Evaluation of current treatment plan related to depression and anxiety  and patient's adherence to  plan as established by provider. 01-15-2021: The patient is a little stressed because of her upcoming heart catheterization. She was supposed to have it today but they had to reschedule it. She states she is doing okay just wants to have it over. Is concerned because of the abnormal testing results and also having a heart attack about 6 years ago. The patient expressed her concerns and feelings. Emotional support given. 02/26/2021 The patient feels she is doing ok presently and just awaiting upcoming angioplasty procedure on 03/18/2021.  She enjoys working in her vegetable and flower  gardens which bring her joy.  Patient states she is not a good sleeper and uses OTC low dose sleeping aid plus Melatonin to sleep a few hours each night. It appears patient is in a good place currently.  . Advised patient to call the office for changes in mood, increased anxiety or depression  . Provided education to patient re: pacing activity, getting rest, monitoring for triggers . Reviewed medications with patient and discussed compliance  . Provided patient with depression and anxiety  educational materials related to reducing stress and how increased anxiety and depression contribute to negative outcomes with health and well being  . Discussed plans with patient for ongoing care management follow up and provided patient with direct contact information for care management team  Patient Goals/Self-Care Activities Over the next 120 days, patient will:  - Patient will self administer medications as prescribed Patient will attend all scheduled provider appointments Patient will call pharmacy for medication refills Patient will attend church or other social activities Patient will continue to perform ADL's independently Patient will continue to perform IADL's independently Patient will call provider office for new concerns or questions Patient will work with BSW to address care coordination needs and will continue to work with the clinical team to address health care and disease management related needs.    - anxiety screen reviewed - depression screen reviewed - medication list reviewed Follow Up Plan: Telephone follow up appointment with care management team member scheduled for: 06-04-2021 at 9:00am       Task: RNCM: Identify Depressive Symptoms and Facilitate Treatment   Note:   Care Management Activities:    - anxiety screen reviewed - depression screen reviewed - medication list reviewed

## 2021-03-07 DIAGNOSIS — I2582 Chronic total occlusion of coronary artery: Secondary | ICD-10-CM | POA: Diagnosis not present

## 2021-03-18 DIAGNOSIS — I700 Atherosclerosis of aorta: Secondary | ICD-10-CM | POA: Diagnosis not present

## 2021-03-18 DIAGNOSIS — E782 Mixed hyperlipidemia: Secondary | ICD-10-CM | POA: Diagnosis not present

## 2021-03-18 DIAGNOSIS — I5022 Chronic systolic (congestive) heart failure: Secondary | ICD-10-CM | POA: Diagnosis not present

## 2021-03-18 DIAGNOSIS — I130 Hypertensive heart and chronic kidney disease with heart failure and stage 1 through stage 4 chronic kidney disease, or unspecified chronic kidney disease: Secondary | ICD-10-CM | POA: Diagnosis not present

## 2021-03-18 DIAGNOSIS — Z7982 Long term (current) use of aspirin: Secondary | ICD-10-CM | POA: Diagnosis not present

## 2021-03-18 DIAGNOSIS — Z79899 Other long term (current) drug therapy: Secondary | ICD-10-CM | POA: Diagnosis not present

## 2021-03-18 DIAGNOSIS — I2582 Chronic total occlusion of coronary artery: Secondary | ICD-10-CM | POA: Diagnosis not present

## 2021-03-18 DIAGNOSIS — N189 Chronic kidney disease, unspecified: Secondary | ICD-10-CM | POA: Diagnosis not present

## 2021-03-18 DIAGNOSIS — Z20822 Contact with and (suspected) exposure to covid-19: Secondary | ICD-10-CM | POA: Diagnosis not present

## 2021-03-18 DIAGNOSIS — I25118 Atherosclerotic heart disease of native coronary artery with other forms of angina pectoris: Secondary | ICD-10-CM | POA: Diagnosis not present

## 2021-03-18 DIAGNOSIS — M810 Age-related osteoporosis without current pathological fracture: Secondary | ICD-10-CM | POA: Diagnosis not present

## 2021-03-18 DIAGNOSIS — I10 Essential (primary) hypertension: Secondary | ICD-10-CM | POA: Diagnosis not present

## 2021-03-18 DIAGNOSIS — Z87891 Personal history of nicotine dependence: Secondary | ICD-10-CM | POA: Diagnosis not present

## 2021-03-18 DIAGNOSIS — Z8616 Personal history of COVID-19: Secondary | ICD-10-CM | POA: Diagnosis not present

## 2021-03-18 DIAGNOSIS — D649 Anemia, unspecified: Secondary | ICD-10-CM | POA: Diagnosis not present

## 2021-03-18 DIAGNOSIS — E785 Hyperlipidemia, unspecified: Secondary | ICD-10-CM | POA: Diagnosis not present

## 2021-03-18 DIAGNOSIS — Z8249 Family history of ischemic heart disease and other diseases of the circulatory system: Secondary | ICD-10-CM | POA: Diagnosis not present

## 2021-03-19 DIAGNOSIS — Z79899 Other long term (current) drug therapy: Secondary | ICD-10-CM | POA: Diagnosis not present

## 2021-03-19 DIAGNOSIS — E782 Mixed hyperlipidemia: Secondary | ICD-10-CM | POA: Diagnosis not present

## 2021-03-19 DIAGNOSIS — Z8616 Personal history of COVID-19: Secondary | ICD-10-CM | POA: Diagnosis not present

## 2021-03-19 DIAGNOSIS — E785 Hyperlipidemia, unspecified: Secondary | ICD-10-CM | POA: Diagnosis not present

## 2021-03-19 DIAGNOSIS — I25118 Atherosclerotic heart disease of native coronary artery with other forms of angina pectoris: Secondary | ICD-10-CM | POA: Diagnosis not present

## 2021-03-19 DIAGNOSIS — M810 Age-related osteoporosis without current pathological fracture: Secondary | ICD-10-CM | POA: Diagnosis not present

## 2021-03-19 DIAGNOSIS — Z20822 Contact with and (suspected) exposure to covid-19: Secondary | ICD-10-CM | POA: Diagnosis not present

## 2021-03-19 DIAGNOSIS — Z87891 Personal history of nicotine dependence: Secondary | ICD-10-CM | POA: Diagnosis not present

## 2021-03-19 DIAGNOSIS — N189 Chronic kidney disease, unspecified: Secondary | ICD-10-CM | POA: Diagnosis not present

## 2021-03-19 DIAGNOSIS — I10 Essential (primary) hypertension: Secondary | ICD-10-CM | POA: Diagnosis not present

## 2021-03-19 DIAGNOSIS — I2582 Chronic total occlusion of coronary artery: Secondary | ICD-10-CM | POA: Diagnosis not present

## 2021-03-19 DIAGNOSIS — I130 Hypertensive heart and chronic kidney disease with heart failure and stage 1 through stage 4 chronic kidney disease, or unspecified chronic kidney disease: Secondary | ICD-10-CM | POA: Diagnosis not present

## 2021-03-19 DIAGNOSIS — I700 Atherosclerosis of aorta: Secondary | ICD-10-CM | POA: Diagnosis not present

## 2021-03-19 DIAGNOSIS — I5022 Chronic systolic (congestive) heart failure: Secondary | ICD-10-CM | POA: Diagnosis not present

## 2021-03-19 DIAGNOSIS — D649 Anemia, unspecified: Secondary | ICD-10-CM | POA: Diagnosis not present

## 2021-03-19 DIAGNOSIS — Z8249 Family history of ischemic heart disease and other diseases of the circulatory system: Secondary | ICD-10-CM | POA: Diagnosis not present

## 2021-03-19 DIAGNOSIS — Z7982 Long term (current) use of aspirin: Secondary | ICD-10-CM | POA: Diagnosis not present

## 2021-04-09 DIAGNOSIS — Z01818 Encounter for other preprocedural examination: Secondary | ICD-10-CM | POA: Diagnosis not present

## 2021-04-09 DIAGNOSIS — I25118 Atherosclerotic heart disease of native coronary artery with other forms of angina pectoris: Secondary | ICD-10-CM | POA: Diagnosis not present

## 2021-04-09 DIAGNOSIS — R079 Chest pain, unspecified: Secondary | ICD-10-CM | POA: Diagnosis not present

## 2021-04-09 DIAGNOSIS — I119 Hypertensive heart disease without heart failure: Secondary | ICD-10-CM | POA: Diagnosis not present

## 2021-04-09 DIAGNOSIS — E785 Hyperlipidemia, unspecified: Secondary | ICD-10-CM | POA: Diagnosis not present

## 2021-04-09 DIAGNOSIS — R0600 Dyspnea, unspecified: Secondary | ICD-10-CM | POA: Diagnosis not present

## 2021-04-14 DIAGNOSIS — Z961 Presence of intraocular lens: Secondary | ICD-10-CM | POA: Diagnosis not present

## 2021-04-18 DIAGNOSIS — R0600 Dyspnea, unspecified: Secondary | ICD-10-CM | POA: Diagnosis not present

## 2021-04-18 DIAGNOSIS — R079 Chest pain, unspecified: Secondary | ICD-10-CM | POA: Diagnosis not present

## 2021-04-18 DIAGNOSIS — I25118 Atherosclerotic heart disease of native coronary artery with other forms of angina pectoris: Secondary | ICD-10-CM | POA: Diagnosis not present

## 2021-04-30 DIAGNOSIS — I2510 Atherosclerotic heart disease of native coronary artery without angina pectoris: Secondary | ICD-10-CM | POA: Diagnosis not present

## 2021-04-30 DIAGNOSIS — I2583 Coronary atherosclerosis due to lipid rich plaque: Secondary | ICD-10-CM | POA: Diagnosis not present

## 2021-05-07 DIAGNOSIS — I2582 Chronic total occlusion of coronary artery: Secondary | ICD-10-CM | POA: Diagnosis not present

## 2021-05-07 DIAGNOSIS — I5032 Chronic diastolic (congestive) heart failure: Secondary | ICD-10-CM | POA: Diagnosis not present

## 2021-05-07 DIAGNOSIS — R1312 Dysphagia, oropharyngeal phase: Secondary | ICD-10-CM | POA: Diagnosis not present

## 2021-05-07 DIAGNOSIS — J984 Other disorders of lung: Secondary | ICD-10-CM | POA: Diagnosis not present

## 2021-05-07 DIAGNOSIS — E890 Postprocedural hypothyroidism: Secondary | ICD-10-CM | POA: Diagnosis not present

## 2021-05-07 DIAGNOSIS — I10 Essential (primary) hypertension: Secondary | ICD-10-CM | POA: Diagnosis not present

## 2021-05-07 DIAGNOSIS — R110 Nausea: Secondary | ICD-10-CM | POA: Diagnosis not present

## 2021-05-07 DIAGNOSIS — J9811 Atelectasis: Secondary | ICD-10-CM | POA: Diagnosis not present

## 2021-05-07 DIAGNOSIS — Z9889 Other specified postprocedural states: Secondary | ICD-10-CM | POA: Diagnosis not present

## 2021-05-07 DIAGNOSIS — Z8673 Personal history of transient ischemic attack (TIA), and cerebral infarction without residual deficits: Secondary | ICD-10-CM | POA: Diagnosis not present

## 2021-05-07 DIAGNOSIS — J939 Pneumothorax, unspecified: Secondary | ICD-10-CM | POA: Diagnosis not present

## 2021-05-07 DIAGNOSIS — R918 Other nonspecific abnormal finding of lung field: Secondary | ICD-10-CM | POA: Diagnosis not present

## 2021-05-07 DIAGNOSIS — Z01818 Encounter for other preprocedural examination: Secondary | ICD-10-CM | POA: Diagnosis not present

## 2021-05-07 DIAGNOSIS — Z7902 Long term (current) use of antithrombotics/antiplatelets: Secondary | ICD-10-CM | POA: Diagnosis not present

## 2021-05-07 DIAGNOSIS — Z7982 Long term (current) use of aspirin: Secondary | ICD-10-CM | POA: Diagnosis not present

## 2021-05-07 DIAGNOSIS — I25110 Atherosclerotic heart disease of native coronary artery with unstable angina pectoris: Secondary | ICD-10-CM | POA: Diagnosis not present

## 2021-05-07 DIAGNOSIS — I361 Nonrheumatic tricuspid (valve) insufficiency: Secondary | ICD-10-CM | POA: Diagnosis not present

## 2021-05-07 DIAGNOSIS — Z9861 Coronary angioplasty status: Secondary | ICD-10-CM | POA: Diagnosis not present

## 2021-05-07 DIAGNOSIS — R5381 Other malaise: Secondary | ICD-10-CM | POA: Diagnosis not present

## 2021-05-07 DIAGNOSIS — N390 Urinary tract infection, site not specified: Secondary | ICD-10-CM | POA: Diagnosis not present

## 2021-05-07 DIAGNOSIS — Z4682 Encounter for fitting and adjustment of non-vascular catheter: Secondary | ICD-10-CM | POA: Diagnosis not present

## 2021-05-07 DIAGNOSIS — D696 Thrombocytopenia, unspecified: Secondary | ICD-10-CM | POA: Diagnosis not present

## 2021-05-07 DIAGNOSIS — D62 Acute posthemorrhagic anemia: Secondary | ICD-10-CM | POA: Diagnosis not present

## 2021-05-07 DIAGNOSIS — E785 Hyperlipidemia, unspecified: Secondary | ICD-10-CM | POA: Diagnosis not present

## 2021-05-07 DIAGNOSIS — J90 Pleural effusion, not elsewhere classified: Secondary | ICD-10-CM | POA: Diagnosis not present

## 2021-05-07 DIAGNOSIS — Z8249 Family history of ischemic heart disease and other diseases of the circulatory system: Secondary | ICD-10-CM | POA: Diagnosis not present

## 2021-05-07 DIAGNOSIS — Z951 Presence of aortocoronary bypass graft: Secondary | ICD-10-CM | POA: Diagnosis not present

## 2021-05-07 DIAGNOSIS — Z408 Encounter for other prophylactic surgery: Secondary | ICD-10-CM | POA: Diagnosis not present

## 2021-05-07 DIAGNOSIS — Z20822 Contact with and (suspected) exposure to covid-19: Secondary | ICD-10-CM | POA: Diagnosis not present

## 2021-05-07 DIAGNOSIS — Z87891 Personal history of nicotine dependence: Secondary | ICD-10-CM | POA: Diagnosis not present

## 2021-05-07 DIAGNOSIS — I6523 Occlusion and stenosis of bilateral carotid arteries: Secondary | ICD-10-CM | POA: Diagnosis not present

## 2021-05-07 DIAGNOSIS — I2510 Atherosclerotic heart disease of native coronary artery without angina pectoris: Secondary | ICD-10-CM | POA: Diagnosis not present

## 2021-05-07 DIAGNOSIS — M810 Age-related osteoporosis without current pathological fracture: Secondary | ICD-10-CM | POA: Diagnosis not present

## 2021-05-07 DIAGNOSIS — Z79899 Other long term (current) drug therapy: Secondary | ICD-10-CM | POA: Diagnosis not present

## 2021-05-07 DIAGNOSIS — Z8616 Personal history of COVID-19: Secondary | ICD-10-CM | POA: Diagnosis not present

## 2021-05-07 DIAGNOSIS — I110 Hypertensive heart disease with heart failure: Secondary | ICD-10-CM | POA: Diagnosis not present

## 2021-05-09 HISTORY — PX: CARDIAC SURGERY: SHX584

## 2021-05-12 ENCOUNTER — Ambulatory Visit: Payer: Medicare Other | Admitting: Family Medicine

## 2021-05-26 DIAGNOSIS — Z7982 Long term (current) use of aspirin: Secondary | ICD-10-CM | POA: Diagnosis not present

## 2021-05-26 DIAGNOSIS — Z79899 Other long term (current) drug therapy: Secondary | ICD-10-CM | POA: Diagnosis not present

## 2021-05-26 DIAGNOSIS — E785 Hyperlipidemia, unspecified: Secondary | ICD-10-CM | POA: Diagnosis not present

## 2021-05-26 DIAGNOSIS — Z8616 Personal history of COVID-19: Secondary | ICD-10-CM | POA: Diagnosis not present

## 2021-05-26 DIAGNOSIS — I110 Hypertensive heart disease with heart failure: Secondary | ICD-10-CM | POA: Diagnosis not present

## 2021-05-26 DIAGNOSIS — I119 Hypertensive heart disease without heart failure: Secondary | ICD-10-CM | POA: Diagnosis not present

## 2021-05-26 DIAGNOSIS — Z87891 Personal history of nicotine dependence: Secondary | ICD-10-CM | POA: Diagnosis not present

## 2021-05-26 DIAGNOSIS — I2510 Atherosclerotic heart disease of native coronary artery without angina pectoris: Secondary | ICD-10-CM | POA: Diagnosis not present

## 2021-05-26 DIAGNOSIS — Z951 Presence of aortocoronary bypass graft: Secondary | ICD-10-CM | POA: Diagnosis not present

## 2021-05-26 DIAGNOSIS — I6521 Occlusion and stenosis of right carotid artery: Secondary | ICD-10-CM | POA: Diagnosis not present

## 2021-05-26 DIAGNOSIS — I5030 Unspecified diastolic (congestive) heart failure: Secondary | ICD-10-CM | POA: Diagnosis not present

## 2021-05-28 ENCOUNTER — Other Ambulatory Visit: Payer: Self-pay | Admitting: Family Medicine

## 2021-05-28 DIAGNOSIS — Z48812 Encounter for surgical aftercare following surgery on the circulatory system: Secondary | ICD-10-CM | POA: Diagnosis not present

## 2021-05-28 DIAGNOSIS — R1310 Dysphagia, unspecified: Secondary | ICD-10-CM | POA: Diagnosis not present

## 2021-05-28 DIAGNOSIS — R9431 Abnormal electrocardiogram [ECG] [EKG]: Secondary | ICD-10-CM | POA: Diagnosis not present

## 2021-05-28 DIAGNOSIS — Z951 Presence of aortocoronary bypass graft: Secondary | ICD-10-CM | POA: Diagnosis not present

## 2021-05-28 NOTE — Telephone Encounter (Signed)
Requested medications are due for refill today.  yes  Requested medications are on the active medications list.  yes  Last refill. 05/10/2020  Future visit scheduled.   yes  Notes to clinic.  Prescription is expired. Refill failed protocol for vitamin D lab work.

## 2021-05-30 ENCOUNTER — Inpatient Hospital Stay: Payer: Medicare Other | Admitting: Family Medicine

## 2021-06-04 ENCOUNTER — Ambulatory Visit (INDEPENDENT_AMBULATORY_CARE_PROVIDER_SITE_OTHER): Payer: Medicare Other

## 2021-06-04 ENCOUNTER — Telehealth: Payer: Medicare Other | Admitting: General Practice

## 2021-06-04 DIAGNOSIS — F339 Major depressive disorder, recurrent, unspecified: Secondary | ICD-10-CM

## 2021-06-04 DIAGNOSIS — E782 Mixed hyperlipidemia: Secondary | ICD-10-CM

## 2021-06-04 DIAGNOSIS — I10 Essential (primary) hypertension: Secondary | ICD-10-CM

## 2021-06-04 DIAGNOSIS — I25119 Atherosclerotic heart disease of native coronary artery with unspecified angina pectoris: Secondary | ICD-10-CM

## 2021-06-04 DIAGNOSIS — F419 Anxiety disorder, unspecified: Secondary | ICD-10-CM

## 2021-06-04 NOTE — Patient Instructions (Signed)
Visit Information  PATIENT GOALS:  Goals Addressed             This Visit's Progress    RNCM: Cope with Pain-Coronary Artery Disease       Follow Up Date 08-05-2021   - learn how to meditate - learn relaxation techniques - practice relaxation or meditation daily - spend time with positive people - tell myself I can (not I can't) - think of new ways to do favorite things - use distraction techniques - use relaxation during pain    Why is this important?   Living with heart (cardiac) pain can be scary.  Feelings like depression or anxiety can make your pain worse.  Learning ways to cope may help with the fear and uncertainty that comes with heart pain.    Notes: Working with the pcp and specialist to determine why she is having chest pain. 01-15-2021: The patient is having a heart catheterization on 01-29-2021. Was supposed to have it today but they had to change the procedure. Knows to go to the ER if NTG does not help the chest pain. Verbalized understanding. 02/26/2021 Coronary Angiography done on 02/06/2021. Plan is for Angioplasty on 03/18/2021 with possible future by-pass surgery if needed. Patient continues to have chest pain with activity and uses NTG tabs PRN.  Pt verbalized understanding of NTG tab use and when to activate 911 for unrelieved chest pain with NTG use. 06-04-2021: Had bypass surgery. Was hospitalized from 05-07-2021 to 05-14-2021. The patient also had a UTI and some dysphagia after intubation. She is recovered from this now. She lost 10 pounds while in the hospital and had issues with medications dropping her blood pressures too low but has all that straight now. She states that the area of surgery is sore but getting better each day. Reviewed sx and sx of infection and what worse looks like. Denies any acute distress at this time. Will continue to monitor.      RNCM: Manage My Sleep Problems-Coronary Artery Disease       Timeframe:  Long-Range Goal Priority:  High Start  Date:   11-20-2020                          Expected End Date:    03-27-2022                  Follow Up Date 08-05-2021   - develop a new routine to improve sleep - don't eat or exercise right before bedtime - eat healthy - get at least 7 to 8 hours of sleep at night - get outdoors every day (weather permitting) - keep room cool and dark - limit daytime naps - practice relaxation or meditation daily - use a fan or white noise in bedroom    Why is this important?   Sometimes you may have a hard time sleeping after you have a heart attack or surgery.  Feelings like depression or anxiety can make the sleep problems worse.  Many things can help you manage it and sleep better.    Notes: The patient has to take an OTC sleep aide to help her rest. She has used Ambien in the past. 06-04-2021: The patient states she is having a hard time trying to shut her mind off. Discussed the use of lavender essential oil to help promote relaxation. She has used Ambien in the past but her doctor only wanted her to take for a 6 month  period or time and then off for 6 months. The patient has used Melatonin in the past. She says it helps some but not a lot. Talked about sleep hygiene and promoting healthy sleep patterns.         The patient verbalized understanding of instructions, educational materials, and care plan provided today and declined offer to receive copy of patient instructions, educational materials, and care plan.   Telephone follow up appointment with care management team member scheduled for: 08-05-2021 at 0900 am  Noreene Larsson RN, MSN, [facility] Mobile: [phone]

## 2021-06-04 NOTE — Chronic Care Management (AMB) (Signed)
Chronic Care Management   CCM RN Visit Note  06/04/2021 Name: Tonya Cabrera MRN: [id number] DOB: [birth year]  Subjective: Tonya Cabrera is a 70 y.o. year old female who is a primary care patient of Valerie Roys, DO. The care management team was consulted for assistance with disease management and care coordination needs.    Engaged with patient by telephone for follow up visit in response to provider referral for case management and/or care coordination services.   Consent to Services:  The patient was given information about Chronic Care Management services, agreed to services, and gave verbal consent prior to initiation of services.  Please see initial visit note for detailed documentation.   Patient agreed to services and verbal consent obtained.   Assessment: Review of patient past medical history, allergies, medications, health status, including review of consultants reports, laboratory and other test data, was performed as part of comprehensive evaluation and provision of chronic care management services.   SDOH (Social Determinants of Health) assessments and interventions performed:    CCM Care Plan  No Known Allergies  Outpatient Encounter Medications as of 06/04/2021  Medication Sig   alendronate (FOSAMAX) 70 MG tablet Take 1 tablet (70 mg total) by mouth every Sunday. Take with a full glass of water on an empty stomach.   aspirin 81 MG EC tablet TAKE 1 TABLET (81 MG TOTAL) BY MOUTH DAILY. (Patient taking differently: Take 81 mg by mouth daily. TAKE 1 TABLET (81 MG TOTAL) BY MOUTH DAILY.)   atorvastatin (LIPITOR) 40 MG tablet Take 1 tablet (40 mg total) by mouth daily.   cholecalciferol (VITAMIN D3) 25 MCG (1000 UNIT) tablet Take 1,000 Units by mouth daily. (Patient not taking: Reported on 02/06/2021)   isosorbide mononitrate (IMDUR) 120 MG 24 hr tablet Take 120 mg by mouth daily.   lisinopril (ZESTRIL) 10 MG tablet Take 1 tablet (10 mg total) by mouth daily.   metoprolol  succinate (TOPROL-XL) 25 MG 24 hr tablet Take 1 tablet (25 mg total) by mouth 2 (two) times daily.   nitroGLYCERIN (NITROSTAT) 0.4 MG SL tablet Place 0.4 mg under the tongue every 5 (five) minutes as needed for chest pain.   No facility-administered encounter medications on file as of 06/04/2021.    Patient Active Problem List   Diagnosis Date Noted   Overactive bladder 06/19/2019   Urge incontinence of urine 06/19/2019   Advance directive discussed with patient 05/02/2019   Diverticulosis of large intestine without diverticulitis    Depression, major, recurrent, in remission ([place]) 03/04/2018   Symptomatic anemia 02/19/2018   Personal history of tobacco use, presenting hazards to health 07/11/2016   Aortic atherosclerosis ([place]) 07/09/2016   Osteoporosis 07/07/2016   Myopia of both eyes 06/16/2016   Hyperlipidemia 06/08/2016   CAD (coronary artery disease) 06/08/2016   Snoring 06/08/2016   Benign hypertensive renal disease 11/14/2015    Conditions to be addressed/monitored:CAD, HTN, HLD, Anxiety, and Depression  Care Plan : RNCM: Coronary Artery Disease (Adult) and HLD  Updates made by Vanita Ingles, RN since 06/04/2021 12:00 AM     Problem: RNCM: Disease Progression (Coronary Artery Disease)   Priority: Medium     Long-Range Goal: RNCM: Disease Progression Prevented or Minimized   Start Date: 11/20/2020  Expected End Date: 03/17/2022  This Visit's Progress: On track  Priority: Medium  Note:   Current Barriers:  Poorly controlled hyperlipidemia, complicated by active chest pain- seeing pcp and specialist Current antihyperlipidemic regimen: atorvastatin 40 mg QD Most recent  lipid panel:     Component Value Date/Time   CHOL 192 11/11/2020 1332   CHOL 235 (H) 06/08/2016 0953   CHOL 192 05/16/2014 0020   TRIG 208 (H) 11/11/2020 1332   TRIG 147 06/08/2016 0953   TRIG 106 05/16/2014 0020   HDL 57 11/11/2020 1332   HDL 48 05/16/2014 0020   CHOLHDL 3.3 04/09/2016 1828   VLDL  29 06/08/2016 0953   VLDL 21 05/16/2014 0020   LDLCALC 100 (H) 11/11/2020 1332   LDLCALC 123 (H) 05/16/2014 0020   ASCVD risk enhancing conditions: age >70, CAD, former MI,  HTN, former smoker Unable to independently manage new onset of chest pain, HLD and CAD Lacks social connections Does not contact provider office for questions/concerns  RN Care Manager Clinical Goal(s):  patient will work with Consulting civil engineer, providers, and care team towards execution of optimized self-health management plan patient will verbalize understanding of plan for effective management of chest pain and discomfort and on going support of chronic conditions of CAD and HLD  patient will work with RNCM, pcp and specialist  to address needs related to new onset of chest pain, HLD, and CAD patient will demonstrate a decrease in chest pain  exacerbations as evidenced by finding etiology of chest pain, adherence to heart healthy diet and taking medications as ordered  patient will attend all scheduled medical appointments: office to call and reschedule missed appointment after recent hospitalization for CAD bypass surgery. In basket message sent to the pcp office staff.   Interventions: Collaboration with Valerie Roys, DO regarding development and update of comprehensive plan of care as evidenced by provider attestation and co-signature Inter-disciplinary care team collaboration (see longitudinal plan of care) Medication review performed; medication list updated in electronic medical record.  Inter-disciplinary care team collaboration (see longitudinal plan of care) Referred to pharmacy team for assistance with HLD and CAD medication management Evaluation of current treatment plan related to HLD and CAD and patient's adherence to plan as established by provider. 01-15-2021: The patient is doing well and denies any concerns. She is having a heart catheterization on 01-29-2021. She says she is having to use the NTG more  often. Stress test and other testing for heart function came back abnormal. The patient just wants to get it over with and find out what is going on. 02/26/2021 Patient had Coronary Angiography procedure done on 02/06/2021.  Next step is for patient to have Coronary Angioplasty performed on 03/18/2021 with possible future by-pass surgery if needed. Patient continues to have chest pain with activity and uses NTG tabs PRN.  Pt verbalized understanding of NTG tab use and when to activate 911 for unrelieved chest pain with NTG use. 06-04-2021: The patient is doing well and states each day she is getting better. She had CAD bypass surgery and was in the hospital from 05-07-2021 to 05-14-2021. The patient states that while she was in the hospital she had an UTI and was treated for that. She also had some dysphagia after intubation and lost 10 pounds but is eating well and feels like she has gained all that weight back now. She states the area of the surgical site is a little sore but is getting better each day. Review of sx and sx to monitor for infection. The patient verbalized understanding.  Advised patient to call the office for changes in condition or questions, seek emergent care for Chest pain not resolved by NTG, if having chest pain call pcp or specialist  and notify of increased incidence of chest pain or discomfort. 01-15-2021: The patient instructed to seek emergent care for unresolved chest pain with NTG. She has seen her cardiologist also and instructed also to seek emergent care for chest pain unresolved with taking NTG. Education also on pacing activity and limiting heavy work. 02/26/2021 Reviewed this education with the patient. 06-04-2021: Denies any CP or other issues related to cardiac health at this time.  Provided education to patient re: taking NTG for new onset of chest pain, seeking emergent care if needed, pacing activity, adherence to provider appointments, and eating heart healthy diet. 01-15-2021. Review  with the patient 02/26/2021 Reviewed this information with the patient. 06-04-2021: The patient knows how and when to use NTG Reviewed scheduled/upcoming provider appointments including: The patient missed an appointment with the pcp after hospitalization. The patient states she needs a follow up with blood work with Dr. Laural Benes. In basket message sent to clinical/admin staff at [facility] to assist patient with getting an appointment for follow up.  Discussed plans with patient for ongoing care management follow up and provided patient with direct contact information for care management team   Patient Goals/Self-Care Activities: patient will:   - call for medicine refill 2 or 3 days before it runs out - call if I am sick and can't take my medicine - keep a list of all the medicines I take; vitamins and herbals too - learn to read medicine labels - use a pillbox to sort medicine - use an alarm clock or phone to remind me to take my medicine - change to whole grain breads, cereal, pasta - drink 6 to 8 glasses of water each day - eat 3 to 5 servings of fruits and vegetables each day - eat 5 or 6 small meals each day - fill half the plate with nonstarchy vegetables - limit fast food meals to no more than 1 per week - manage portion size - prepare main meal at home 3 to 5 days each week - read food labels for fat, fiber, carbohydrates and portion size - be open to making changes - I can manage, know and watch for signs of a heart attack - if I have chest pain, call for help -use NTG as needed for new onset of chest pain - learn about small changes that will make a big difference - learn my personal risk factors   Follow Up Plan: Telephone follow up appointment with care management team member scheduled for: 08-05-2021 at 9:00am      Task: Alleviate Barriers to Coronary Artery Disease Therapy Completed 06/04/2021  Outcome: Positive  Note:   Care Management Activities:    - barriers to treatment  adherence reviewed and addressed - communicable disease prevention promoted - complementary therapy use encouraged - difficulty of making life-long changes acknowledged - functional limitation screening reviewed - healthy lifestyle promoted - individualized medical nutrition therapy provided - medication-adherence assessment completed - medication side effects managed - response to pharmacologic therapy monitored - self-awareness of signs/symptoms of worsening disease encouraged        Care Plan : RNCM: Hypertension (Adult)  Updates made by Marlowe Sax, RN since 06/04/2021 12:00 AM     Problem: RNCM: Hypertension (Hypertension)   Priority: Medium     Long-Range Goal: RNCM: Hypertension Monitored   Start Date: 11/20/2020  Expected End Date: 03/27/2022  This Visit's Progress: On track  Priority: Medium  Note:   Objective:  Last practice recorded BP readings:  BP Readings from Last 3 Encounters:  02/06/21 121/77  11/11/20 (!) 145/75  05/10/20 120/68     Most recent eGFR/CrCl: No results found for: EGFR  No components found for: CRCL Current Barriers:  Knowledge Deficits related to basic understanding of hypertension pathophysiology and self care management Knowledge Deficits related to understanding of medications prescribed for management of hypertension Limited Social Support Lacks social connections Does not contact provider office for questions/concerns Case Manager Clinical Goal(s):  patient will verbalize understanding of plan for hypertension management  patient will attend all scheduled medical appointments: 05-12-2021  patient will demonstrate improved adherence to prescribed treatment plan for hypertension as evidenced by taking all medications as prescribed, monitoring and recording blood pressure as directed, adhering to low sodium/DASH diet  patient will demonstrate improved health management independence as evidenced by checking blood pressure as directed and  notifying PCP if SBP>160 or DBP > 90, taking all medications as prescribe, and adhering to a low sodium diet as discussed.  patient will verbalize basic understanding of hypertension disease process and self health management plan as evidenced by compliance with medications, compliance with dietary restrictions, and working with the CCM team to manage health and well being  Interventions:  Collaboration with Valerie Roys, DO regarding development and update of comprehensive plan of care as evidenced by provider attestation and co-signature Inter-disciplinary care team collaboration (see longitudinal plan of care) Evaluation of current treatment plan related to hypertension self management and patient's adherence to plan as established by provider. 01-15-2021: The patient states her blood pressure is doing well. She is eating good and monitoring sodium and fats. Will continue to monitor for changes. 02/26/2021: The patient is not checking her blood pressure on a regular basis but will check it periodically, especially when she feels light-headed.  Otherwise she thinks her blood pressure is doing well. 06-04-2021: The patient states she was having issues with her blood pressure dropping too low after her CAD bypass because of medications. The patient states they have that all sorted out now and her blood pressure at [place] last week was "perfect". Denies any issues with HTN at this time. Will continue to monitor.  Provided education to patient re: stroke prevention, s/s of heart attack and stroke, DASH diet, complications of uncontrolled blood pressure. 06-04-2021: Reviewed this education with the patient. Patient verbalized understanding. Reviewed medications with patient and discussed importance of compliance. 01-15-2021- The patient is compliant with medications. 06-04-2021: The patient continues to be compliant with her medications. Discussed plans with patient for ongoing care management follow up and provided  patient with direct contact information for care management team Advised patient, providing education and rationale, to monitor blood pressure daily and record, calling PCP for findings outside established parameters.  Reviewed scheduled/upcoming provider appointments including: Missed appointment after hospitalization. In basket message to [facility] staff to call the patient to get a follow up appointment scheduled for the patient.  Patient Goals: - blood pressure trends reviewed - depression screen reviewed - home or ambulatory blood pressure monitoring encouraged Self-Care Activities: - Self administers medications as prescribed Attends all scheduled provider appointments Calls provider office for new concerns, questions, or BP outside discussed parameters Checks BP and records as discussed Follows a low sodium diet/DASH diet - blood pressure trends reviewed - depression screen reviewed - home or ambulatory blood pressure monitoring encouraged Follow Up Plan: Telephone follow up appointment with care management team member scheduled for: 08-05-2021 at 9:00am     Task: RNCM: Identify and  Monitor Blood Pressure Elevation Completed 06/04/2021  Outcome: Positive  Note:   Care Management Activities:    - blood pressure trends reviewed - depression screen reviewed - home or ambulatory blood pressure monitoring encouraged        Care Plan : RNCM: Depression (Adult) and Anxiety  Updates made by Vanita Ingles, RN since 06/04/2021 12:00 AM     Problem: RNCM: Depression Identification (Depression) and anxiety   Priority: Medium     Long-Range Goal: RNCM: Depressive Symptoms Identified/Anxiety triggers   Start Date: 11/20/2020  Expected End Date: 03/17/2022  This Visit's Progress: On track  Priority: Medium  Note:   Current Barriers:  Chronic Disease Management support and education needs related to depression and anxiety  Lacks caregiver support.  Lacks social connections Does not  contact provider office for questions/concerns  Nurse Case Manager Clinical Goal(s):  patient will verbalize understanding of plan for effective management of depression and anxiety  patient will work with RNCM and pcp  to address needs related to increased anxiety and depression  patient will demonstrate a decrease in anxiety and depression  exacerbations as evidenced by resolution of chest pain, normalized mood, improved health and well being patient will attend all scheduled medical appointments: office to call for new appointment  Interventions:  1:1 collaboration with Valerie Roys, DO regarding development and update of comprehensive plan of care as evidenced by provider attestation and co-signature Inter-disciplinary care team collaboration (see longitudinal plan of care) Evaluation of current treatment plan related to depression and anxiety  and patient's adherence to plan as established by provider. 01-15-2021: The patient is a little stressed because of her upcoming heart catheterization. She was supposed to have it today but they had to reschedule it. She states she is doing okay just wants to have it over. Is concerned because of the abnormal testing results and also having a heart attack about 6 years ago. The patient expressed her concerns and feelings. Emotional support given. 02/26/2021 The patient feels she is doing ok presently and just awaiting upcoming angioplasty procedure on 03/18/2021.  She enjoys working in her vegetable and flower gardens which bring her joy.  Patient states she is not a good sleeper and uses OTC low dose sleeping aid plus Melatonin to sleep a few hours each night. It appears patient is in a good place currently. 06-04-2021: The patient is doing much better since having her CAD bypass. She still is not sleeping well but using OTC medications. She feels she is getting stronger every day and happy to be home. States she did not eat well while in the hospital but has  gotten her appetite back since being home and her daughter was cooking for her. She feels very blessed as she has an amazing support system. Will continue to monitor.  Advised patient to call the office for changes in mood, increased anxiety or depression  Provided education to patient re: pacing activity, getting rest, monitoring for triggers Reviewed medications with patient and discussed compliance  Provided patient with depression and anxiety  educational materials related to reducing stress and how increased anxiety and depression contribute to negative outcomes with health and well being  Discussed plans with patient for ongoing care management follow up and provided patient with direct contact information for care management team  Patient Goals/Self-Care Activities  patient will:  - Patient will self administer medications as prescribed Patient will attend all scheduled provider appointments Patient will call pharmacy for medication refills Patient  will attend church or other social activities Patient will continue to perform ADL's independently Patient will continue to perform IADL's independently Patient will call provider office for new concerns or questions Patient will work with BSW to address care coordination needs and will continue to work with the clinical team to address health care and disease management related needs.    - anxiety screen reviewed - depression screen reviewed - medication list reviewed Follow Up Plan: Telephone follow up appointment with care management team member scheduled for: 08-05-2021 at 9:00am        Task: RNCM: Identify Depressive Symptoms and Facilitate Treatment Completed 06/04/2021  Outcome: Positive  Note:   Care Management Activities:    - anxiety screen reviewed - depression screen reviewed - medication list reviewed         Plan:Telephone follow up appointment with care management team member scheduled for:  08-05-2021 at 0900  am  Noreene Larsson RN, MSN, [facility] Mobile: [phone]

## 2021-06-09 DIAGNOSIS — Z9889 Other specified postprocedural states: Secondary | ICD-10-CM | POA: Diagnosis not present

## 2021-06-09 DIAGNOSIS — R079 Chest pain, unspecified: Secondary | ICD-10-CM | POA: Diagnosis not present

## 2021-06-09 DIAGNOSIS — R0609 Other forms of dyspnea: Secondary | ICD-10-CM | POA: Diagnosis not present

## 2021-06-09 DIAGNOSIS — I2582 Chronic total occlusion of coronary artery: Secondary | ICD-10-CM | POA: Diagnosis not present

## 2021-06-09 DIAGNOSIS — E782 Mixed hyperlipidemia: Secondary | ICD-10-CM | POA: Diagnosis not present

## 2021-06-09 DIAGNOSIS — I10 Essential (primary) hypertension: Secondary | ICD-10-CM | POA: Diagnosis not present

## 2021-06-09 DIAGNOSIS — I2510 Atherosclerotic heart disease of native coronary artery without angina pectoris: Secondary | ICD-10-CM | POA: Diagnosis not present

## 2021-06-09 DIAGNOSIS — I25118 Atherosclerotic heart disease of native coronary artery with other forms of angina pectoris: Secondary | ICD-10-CM | POA: Diagnosis not present

## 2021-06-09 DIAGNOSIS — E785 Hyperlipidemia, unspecified: Secondary | ICD-10-CM | POA: Diagnosis not present

## 2021-06-09 DIAGNOSIS — I519 Heart disease, unspecified: Secondary | ICD-10-CM | POA: Diagnosis not present

## 2021-06-09 DIAGNOSIS — Z951 Presence of aortocoronary bypass graft: Secondary | ICD-10-CM | POA: Diagnosis not present

## 2021-06-17 ENCOUNTER — Ambulatory Visit (INDEPENDENT_AMBULATORY_CARE_PROVIDER_SITE_OTHER): Payer: Medicare Other

## 2021-06-17 DIAGNOSIS — Z0000 Encounter for general adult medical examination without abnormal findings: Secondary | ICD-10-CM

## 2021-06-17 NOTE — Progress Notes (Signed)
Subjective:   Tonya Cabrera is a 70 y.o. female who presents for Medicare Annual (Subsequent) preventive examination.  I connected with  Tonya Cabrera on 06/17/21 by an audio only telemedicine application and verified that I am speaking with the correct person using two identifiers.   I discussed the limitations, risks, security and privacy concerns of performing an evaluation and management service by telephone and the availability of in person appointments. I also discussed with the patient that there may be a patient responsible charge related to this service. The patient expressed understanding and verbally consented to this telephonic visit.  Location of Patient: Home Location of Provider: [place]  Persons Participating in Visit: Tonya Cabrera (Patient), Irena Reichmann ([place])  List any persons and their role that are participating in the visit with the patient.    Review of Systems     Defer to Provider Cardiac Risk Factors include: none     Objective:    Today's Vitals   06/17/21 1805  PainSc: 3    There is no height or weight on file to calculate BMI.  Advanced Directives 06/17/2021 02/06/2021 10/06/2018 06/08/2018 02/19/2018 11/23/2017 11/09/2017  Does Patient Have a Medical Advance Directive? No No No No No No No  Does patient want to make changes to medical advance directive? No - Patient declined - - - - - -  Would patient like information on creating a medical advance directive? No - Patient declined - No - Patient declined - No - Patient declined No - Patient declined No - Patient declined    Current Medications (verified) Outpatient Encounter Medications as of 06/17/2021  Medication Sig   alendronate (FOSAMAX) 70 MG tablet Take 1 tablet (70 mg total) by mouth every Sunday. Take with a full glass of water on an empty stomach.   aspirin 81 MG EC tablet TAKE 1 TABLET (81 MG TOTAL) BY MOUTH DAILY. (Patient taking differently: Take 81 mg by mouth daily.  TAKE 1 TABLET (81 MG TOTAL) BY MOUTH DAILY.)   atorvastatin (LIPITOR) 80 MG tablet Take by mouth.   metoprolol tartrate (LOPRESSOR) 25 MG tablet SMARTSIG:1 Tablet(s) By Mouth Every 12 Hours   nitroGLYCERIN (NITROSTAT) 0.4 MG SL tablet Place 0.4 mg under the tongue every 5 (five) minutes as needed for chest pain.   pantoprazole (PROTONIX) 20 MG tablet Take by mouth.   cholecalciferol (VITAMIN D3) 25 MCG (1000 UNIT) tablet Take 1,000 Units by mouth daily. (Patient not taking: Reported on 06/17/2021)   isosorbide mononitrate (IMDUR) 120 MG 24 hr tablet Take 120 mg by mouth daily. (Patient not taking: Reported on 06/17/2021)   lisinopril (ZESTRIL) 10 MG tablet Take 1 tablet (10 mg total) by mouth daily. (Patient not taking: Reported on 06/17/2021)   [DISCONTINUED] atorvastatin (LIPITOR) 40 MG tablet Take 1 tablet (40 mg total) by mouth daily.   [DISCONTINUED] metoprolol succinate (TOPROL-XL) 25 MG 24 hr tablet Take 1 tablet (25 mg total) by mouth 2 (two) times daily.   No facility-administered encounter medications on file as of 06/17/2021.    Allergies (verified) Patient has no known allergies.   History: Past Medical History:  Diagnosis Date   Acute gastric ulcer without hemorrhage or perforation    Acute osteomyelitis of hand ([place]) 03/01/2015   Acute posthemorrhagic anemia    AKI (acute kidney injury) ([place]) 02/28/2015   Arthritis    Benign neoplasm of cecum    Coronary artery disease    coronary stent   Dyspnea  Gastritis, Helicobacter pylori    Hard of hearing    Heart murmur    Hypercholesteremia    Hypertension    Myocardial infarction [facility])    2015   Osteoporosis    Peptic ulcer    Special screening for malignant neoplasms, colon    Stomach irritation    Stroke ([place])    10-12 years ago   Past Surgical History:  Procedure Laterality Date   CARDIAC SURGERY  05/09/2021   CAROTID STENT     CATARACT EXTRACTION W/PHACO Right 11/09/2017   Procedure: CATARACT EXTRACTION PHACO AND  INTRAOCULAR LENS PLACEMENT ([place]) RIGHT;  Surgeon: Eulogio Bear, MD;  Location: [place];  Service: Ophthalmology;  Laterality: Right;   CATARACT EXTRACTION W/PHACO Left 11/23/2017   Procedure: CATARACT EXTRACTION PHACO AND INTRAOCULAR LENS PLACEMENT ([place]) LEFT;  Surgeon: Eulogio Bear, MD;  Location: [place];  Service: Ophthalmology;  Laterality: Left;   COLONOSCOPY WITH PROPOFOL N/A 10/06/2018   Procedure: COLONOSCOPY WITH PROPOFOL;  Surgeon: Virgel Manifold, MD;  Location: [facility];  Service: Endoscopy;  Laterality: N/A;   ESOPHAGOGASTRODUODENOSCOPY (EGD) WITH PROPOFOL N/A 02/22/2018   Procedure: ESOPHAGOGASTRODUODENOSCOPY (EGD) WITH PROPOFOL;  Surgeon: Virgel Manifold, MD;  Location: [facility];  Service: Endoscopy;  Laterality: N/A;   ESOPHAGOGASTRODUODENOSCOPY (EGD) WITH PROPOFOL N/A 06/08/2018   Procedure: ESOPHAGOGASTRODUODENOSCOPY (EGD) WITH PROPOFOL;  Surgeon: Virgel Manifold, MD;  Location: [facility];  Service: Endoscopy;  Laterality: N/A;   ESOPHAGOGASTRODUODENOSCOPY (EGD) WITH PROPOFOL N/A 10/06/2018   Procedure: ESOPHAGOGASTRODUODENOSCOPY (EGD) WITH PROPOFOL;  Surgeon: Virgel Manifold, MD;  Location: [facility];  Service: Endoscopy;  Laterality: N/A;   INCONTINENCE SURGERY     LEFT HEART CATH AND CORONARY ANGIOGRAPHY N/A 02/06/2021   Procedure: LEFT HEART CATH AND CORONARY ANGIOGRAPHY;  Surgeon: Yolonda Kida, MD;  Location: [place] [facility];  Service: Cardiovascular;  Laterality: N/A;   THYROID SURGERY     Family History  Problem Relation Age of Onset   Liver cancer Brother    Heart Problems Mother    Breast cancer Neg Hx    Social History   Socioeconomic History   Marital status: Divorced    Spouse name: Not on file   Number of children: Not on file   Years of education: Not on file   Highest education level: Not on file  Occupational History   Occupation: retired   Occupation: part Pharmacologist  Tobacco Use   Smoking status: Former    Packs/day: 1.50    Years: 22.00    Pack years: 33.00    Types: Cigarettes    Quit date: 05/08/2014    Years since quitting: 7.1   Smokeless tobacco: Never   Tobacco comments:    Smoked over 25 years, quit after Heart Attack in 2015.    Vaping Use   Vaping Use: Never used  Substance and Sexual Activity   Alcohol use: No    Alcohol/week: 0.0 standard drinks   Drug use: No   Sexual activity: Not Currently  Other Topics Concern   Not on file  Social History Narrative   Not on file   Social Determinants of Health   Financial Resource Strain: Low Risk    Difficulty of Paying Living Expenses: Not very hard  Food Insecurity: No Food Insecurity   Worried About Running Out of Food in the Last Year: Never true   Ran Out of Food in the Last Year: Never true  Transportation Needs: No Transportation  Needs   Lack of Transportation (Medical): No   Lack of Transportation (Non-Medical): No  Physical Activity: Sufficiently Active   Days of Exercise per Week: 7 days   Minutes of Exercise per Session: 60 min  Stress: Stress Concern Present   Feeling of Stress : Rather much  Social Connections: Socially Isolated   Frequency of Communication with Friends and Family: More than three times a week   Frequency of Social Gatherings with Friends and Family: Three times a week   Attends Religious Services: Never   Active Member of Clubs or Organizations: No   Attends Archivist Meetings: Never   Marital Status: Divorced    Tobacco Counseling Counseling given: Not Answered Tobacco comments: Smoked over 25 years, quit after Heart Attack in 2015.     Clinical Intake:  Pre-visit preparation completed: Yes  Pain : 0-10 Pain Score: 3  Pain Type: Acute pain Pain Location: Chest Pain Descriptors / Indicators: Aching Pain Onset: More than a month ago Pain Frequency: Occasional     Nutritional Risks: None Diabetes: No  How often  do you need to have someone help you when you read instructions, pamphlets, or other written materials from your doctor or pharmacy?: 1 - Never What is the last grade level you completed in school?: Some College  Diabetic? NO  Interpreter Needed?: No      Activities of Daily Living In your present state of health, do you have any difficulty performing the following activities: 06/17/2021 02/06/2021  Hearing? Y N  Vision? N N  Difficulty concentrating or making decisions? Y N  Walking or climbing stairs? N N  Dressing or bathing? N N  Doing errands, shopping? N -  Preparing Food and eating ? N -  Using the Toilet? N -  In the past six months, have you accidently leaked urine? Y -  Do you have problems with loss of bowel control? Y -  Managing your Medications? N -  Managing your Finances? N -  Housekeeping or managing your Housekeeping? N -  Some recent data might be hidden    Patient Care Team: Valerie Roys, DO as PCP - General (Family Medicine) Vanita Ingles, RN as Case Manager (General Practice)  Indicate any recent Medical Services you may have received from other than [facility] providers in the past year (date may be approximate).     Assessment:   This is a routine wellness examination for [place].  Hearing/Vision screen No results found.  Dietary issues and exercise activities discussed: Exercise limited by: None identified   Goals Addressed   None   Depression Screen PHQ 2/9 Scores 06/17/2021 11/11/2020 05/10/2020 11/02/2019 10/27/2019 05/02/2019 01/03/2019  PHQ - 2 Score 0 0 0 0 0 0 0  PHQ- 9 Score - 9 4 3  - 4 4    Fall Risk Fall Risk  06/17/2021 05/10/2020 05/10/2020 05/02/2019 06/02/2017  Falls in the past year? 0 0 0 0 No  Number falls in past yr: 0 0 0 0 -  Injury with Fall? 0 0 0 0 -  Risk for fall due to : No Fall Risks - - - -  Follow up Falls evaluation completed - - - -    FALL RISK PREVENTION PERTAINING TO THE HOME:  Any stairs in or around the home? No   If so, are there any without handrails? No  Home free of loose throw rugs in walkways, pet beds, electrical cords, etc? Yes  Adequate lighting in  your home to reduce risk of falls? Yes   ASSISTIVE DEVICES UTILIZED TO PREVENT FALLS:  Life alert? No  Use of a cane, Schaffer or w/c? No  Grab bars in the bathroom? No  Shower chair or bench in shower? No  Elevated toilet seat or a handicapped toilet? No   TIMED UP AND GO:  Was the test performed? N/A.  Length of time to ambulate 10 feet: N/A sec.     Cognitive Function:     6CIT Screen 06/17/2021 05/10/2020 05/02/2019 06/02/2017  What Year? 0 points 0 points 0 points 0 points  What month? 0 points 0 points 0 points 0 points  What time? 0 points 0 points 0 points 0 points  Count back from 20 0 points 0 points 0 points 0 points  Months in reverse 0 points 0 points 0 points 0 points  Repeat phrase 0 points 0 points 0 points 0 points  Total Score 0 0 0 0    Immunizations Immunization History  Administered Date(s) Administered   Moderna Sars-Covid-2 Vaccination 04/27/2020, 05/25/2020   Pneumococcal Polysaccharide-23 05/05/2013   Tdap 05/05/2013    TDAP status: Up to date  Flu Vaccine status: Due, Education has been provided regarding the importance of this vaccine. Advised may receive this vaccine at local pharmacy or Health Dept. Aware to provide a copy of the vaccination record if obtained from local pharmacy or Health Dept. Verbalized acceptance and understanding.  Pneumococcal vaccine status: Due, Education has been provided regarding the importance of this vaccine. Advised may receive this vaccine at local pharmacy or Health Dept. Aware to provide a copy of the vaccination record if obtained from local pharmacy or Health Dept. Verbalized acceptance and understanding.  Covid-19 vaccine status: Completed vaccines  Qualifies for Shingles Vaccine? No  Zostavax completed? No Shingrix Completed?: No.    Education has been provided  regarding the importance of this vaccine. Patient has been advised to call insurance company to determine out of pocket expense if they have not yet received this vaccine. Advised may also receive vaccine at local pharmacy or Health Dept. Verbalized acceptance and understanding.  Screening Tests Health Maintenance  Topic Date Due   Zoster Vaccines- Shingrix (1 of 2) Never done   COVID-19 Vaccine (3 - Moderna risk series) 06/22/2020   INFLUENZA VACCINE  Never done   MAMMOGRAM  04/18/2022   TETANUS/TDAP  05/06/2023   COLONOSCOPY (Pts 45-86yrs Insurance coverage will need to be confirmed)  10/07/2023   DEXA SCAN  Completed   Hepatitis C Screening  Completed   HPV VACCINES  Aged Out    Health Maintenance  Health Maintenance Due  Topic Date Due   Zoster Vaccines- Shingrix (1 of 2) Never done   COVID-19 Vaccine (3 - Moderna risk series) 06/22/2020   INFLUENZA VACCINE  Never done    Colorectal cancer screening: Type of screening: Colonoscopy. Completed 10/06/2018. Repeat every 5 years  Mammogram status: Completed 04/18/2020. Repeat every year 2  Bone Density status: Completed 07/07/2016. Results reflect: Bone density results: NORMAL. Repeat every   years.  Lung Cancer Screening: (Low Dose CT Chest recommended if Age 56-80 years, 30 pack-year currently smoking OR have quit w/in 15years.) does not qualify.   Lung Cancer Screening Referral: NO  Additional Screening:  Hepatitis C Screening: does qualify; Completed 06/16/2016  Vision Screening: Recommended annual ophthalmology exams for early detection of glaucoma and other disorders of the eye. Is the patient up to date with their annual eye exam?  Yes  Who is the provider or what is the name of the office in which the patient attends annual eye exams? [facility] If pt is not established with a provider, would they like to be referred to a provider to establish care? No .   Dental Screening: Recommended annual dental exams  for proper oral hygiene  Community Resource Referral / Chronic Care Management: CRR required this visit?  No   CCM required this visit?  No      Plan:     I have personally reviewed and noted the following in the patient's chart:   Medical and social history Use of alcohol, tobacco or illicit drugs  Current medications and supplements including opioid prescriptions.  Functional ability and status Nutritional status Physical activity Advanced directives List of other physicians Hospitalizations, surgeries, and ER visits in previous 12 months Vitals Screenings to include cognitive, depression, and falls Referrals and appointments  In addition, I have reviewed and discussed with patient certain preventive protocols, quality metrics, and best practice recommendations. A written personalized care plan for preventive services as well as general preventive health recommendations were provided to patient.  I connected with  Tonya Cabrera on 06/17/21 by a video enabled telemedicine application and verified that I am speaking with the correct person using two identifiers.   I discussed the limitations of evaluation and management by telemedicine. The patient expressed understanding and agreed to proceed.      Irena Reichmann, [facility]   06/17/2021   Nurse Notes: Non Face to Face 60 minutes

## 2021-06-23 ENCOUNTER — Encounter: Payer: Self-pay | Admitting: Family Medicine

## 2021-06-23 ENCOUNTER — Other Ambulatory Visit: Payer: Self-pay

## 2021-06-23 ENCOUNTER — Ambulatory Visit (INDEPENDENT_AMBULATORY_CARE_PROVIDER_SITE_OTHER): Payer: Medicare Other | Admitting: Family Medicine

## 2021-06-23 VITALS — BP 153/76 | HR 73 | Ht 63.0 in | Wt 139.0 lb

## 2021-06-23 DIAGNOSIS — Z0000 Encounter for general adult medical examination without abnormal findings: Secondary | ICD-10-CM

## 2021-06-23 DIAGNOSIS — Z1231 Encounter for screening mammogram for malignant neoplasm of breast: Secondary | ICD-10-CM

## 2021-06-23 DIAGNOSIS — Z13820 Encounter for screening for osteoporosis: Secondary | ICD-10-CM | POA: Diagnosis not present

## 2021-06-23 DIAGNOSIS — F3340 Major depressive disorder, recurrent, in remission, unspecified: Secondary | ICD-10-CM | POA: Diagnosis not present

## 2021-06-23 DIAGNOSIS — M810 Age-related osteoporosis without current pathological fracture: Secondary | ICD-10-CM

## 2021-06-23 DIAGNOSIS — I700 Atherosclerosis of aorta: Secondary | ICD-10-CM

## 2021-06-23 DIAGNOSIS — I10 Essential (primary) hypertension: Secondary | ICD-10-CM | POA: Diagnosis not present

## 2021-06-23 DIAGNOSIS — E782 Mixed hyperlipidemia: Secondary | ICD-10-CM

## 2021-06-23 DIAGNOSIS — D692 Other nonthrombocytopenic purpura: Secondary | ICD-10-CM | POA: Diagnosis not present

## 2021-06-23 DIAGNOSIS — I25119 Atherosclerotic heart disease of native coronary artery with unspecified angina pectoris: Secondary | ICD-10-CM

## 2021-06-23 DIAGNOSIS — I129 Hypertensive chronic kidney disease with stage 1 through stage 4 chronic kidney disease, or unspecified chronic kidney disease: Secondary | ICD-10-CM | POA: Diagnosis not present

## 2021-06-23 LAB — MICROALBUMIN, URINE WAIVED
Creatinine, Urine Waived: 100 mg/dL (ref 10–300)
Microalb, Ur Waived: 10 mg/L (ref 0–19)
Microalb/Creat Ratio: <30 mg/g (ref <30)

## 2021-06-23 LAB — MICROSCOPIC EXAMINATION: Bacteria, UA: NONE SEEN

## 2021-06-23 LAB — URINALYSIS, ROUTINE W REFLEX MICROSCOPIC
Bilirubin, UA: NEGATIVE
Glucose, UA: NEGATIVE
Ketones, UA: NEGATIVE
Nitrite, UA: NEGATIVE
Protein,UA: NEGATIVE
Specific Gravity, UA: 1.02 (ref 1.005–1.030)
Urobilinogen, Ur: 0.2 mg/dL (ref 0.2–1.0)
pH, UA: 5 (ref 5.0–7.5)

## 2021-06-23 NOTE — Assessment & Plan Note (Signed)
Reassured patient. Continue to monitor.  

## 2021-06-23 NOTE — Assessment & Plan Note (Signed)
Continue to follow with cardiology. Call with any concerns. Will keep BP and cholesterol continue diet and exercise. Call with any concerns.

## 2021-06-23 NOTE — Assessment & Plan Note (Signed)
Under good control on current regimen. Continue current regimen. Continue to monitor. Call with any concerns. Refills up to date. Labs drawn today.  

## 2021-06-23 NOTE — Assessment & Plan Note (Signed)
Due for recheck on DEXA. Ordered today.

## 2021-06-23 NOTE — Progress Notes (Signed)
BP (!) 153/76   Pulse 73   Ht 5\' 3"  (1.6 m)   Wt 139 lb (63 kg)   BMI 24.62 kg/m    Subjective:    Patient ID: Tonya Cabrera, female    DOB: 18-Jan-1951, 70 y.o.   MRN: [id number]  HPI: CARRIANN Cabrera is a 70 y.o. female presenting on 06/23/2021 for comprehensive medical examination. Current medical complaints include:  HOSPITAL FOLLOW UP Time since discharge: about a month and a half Hospital/facility: [facility] Diagnosis: CAD/CABG Procedures/tests: CABG Consultants: cardiology New medications: none Discharge instructions: follow up with cardiology and here  Status: better  HYPERTENSION / HYPERLIPIDEMIA Satisfied with current treatment? yes Duration of hypertension: chronic BP monitoring frequency: not checking BP medication side effects: no Past BP meds: metoprolol Duration of hyperlipidemia: chronic Cholesterol medication side effects: no Cholesterol supplements: none Past cholesterol medications: atorvastatin Medication compliance: excellent compliance Aspirin: yes Recent stressors: no Recurrent headaches: no Visual changes: no Palpitations: no Dyspnea: no Chest pain: no Lower extremity edema: no Dizzy/lightheaded: no  Menopausal Symptoms: no  Depression Screen done today and results listed below:  Depression screen [facility] 2/9 06/23/2021 06/23/2021 06/17/2021 11/11/2020 05/10/2020  Decreased Interest 0 0 0 0 0  Down, Depressed, Hopeless 0 0 0 0 0  PHQ - 2 Score 0 0 0 0 0  Altered sleeping 2 - - 3 3  Tired, decreased energy 0 - - 3 1  Change in appetite 0 - - 3 0  Feeling bad or failure about yourself  0 - - 0 0  Trouble concentrating 2 - - 0 0  Moving slowly or fidgety/restless 0 - - 0 0  Suicidal thoughts 0 - - 0 0  PHQ-9 Score 4 - - 9 4  Difficult doing work/chores - - - - Not difficult at all  Some recent data might be hidden    Past Medical History:  Past Medical History:  Diagnosis Date   Acute gastric ulcer without hemorrhage or perforation    Acute  osteomyelitis of hand ([place]) 03/01/2015   Acute posthemorrhagic anemia    AKI (acute kidney injury) ([place]) 02/28/2015   Arthritis    Benign neoplasm of cecum    Coronary artery disease    coronary stent   Dyspnea    Gastritis, Helicobacter pylori    Hard of hearing    Heart murmur    Hypercholesteremia    Hypertension    Myocardial infarction ([place])    2015   Osteoporosis    Peptic ulcer    Special screening for malignant neoplasms, colon    Stomach irritation    Stroke ([place])    10-12 years ago    Surgical History:  Past Surgical History:  Procedure Laterality Date   CARDIAC SURGERY  05/09/2021   CAROTID STENT     CATARACT EXTRACTION W/PHACO Right 11/09/2017   Procedure: CATARACT EXTRACTION PHACO AND INTRAOCULAR LENS PLACEMENT (I[facility]) RIGHT;  Surgeon: Eulogio Bear, MD;  Location: [facility];  Service: Ophthalmology;  Laterality: Right;   CATARACT EXTRACTION W/PHACO Left 11/23/2017   Procedure: CATARACT EXTRACTION PHACO AND INTRAOCULAR LENS PLACEMENT ([place]) LEFT;  Surgeon: Eulogio Bear, MD;  Location: [facility];  Service: Ophthalmology;  Laterality: Left;   COLONOSCOPY WITH PROPOFOL N/A 10/06/2018   Procedure: COLONOSCOPY WITH PROPOFOL;  Surgeon: Virgel Manifold, MD;  Location: [facility] ENDOSCOPY;  Service: Endoscopy;  Laterality: N/A;   ESOPHAGOGASTRODUODENOSCOPY (EGD) WITH PROPOFOL N/A 02/22/2018   Procedure: ESOPHAGOGASTRODUODENOSCOPY (EGD) WITH PROPOFOL;  Surgeon: Virgel Manifold, MD;  Location: [facility] ENDOSCOPY;  Service: Endoscopy;  Laterality: N/A;   ESOPHAGOGASTRODUODENOSCOPY (EGD) WITH PROPOFOL N/A 06/08/2018   Procedure: ESOPHAGOGASTRODUODENOSCOPY (EGD) WITH PROPOFOL;  Surgeon: Virgel Manifold, MD;  Location: [facility] ENDOSCOPY;  Service: Endoscopy;  Laterality: N/A;   ESOPHAGOGASTRODUODENOSCOPY (EGD) WITH PROPOFOL N/A 10/06/2018   Procedure: ESOPHAGOGASTRODUODENOSCOPY (EGD) WITH PROPOFOL;  Surgeon: Virgel Manifold, MD;  Location: [facility]  ENDOSCOPY;  Service: Endoscopy;  Laterality: N/A;   INCONTINENCE SURGERY     LEFT HEART CATH AND CORONARY ANGIOGRAPHY N/A 02/06/2021   Procedure: LEFT HEART CATH AND CORONARY ANGIOGRAPHY;  Surgeon: Yolonda Kida, MD;  Location: [facility] CV [facility];  Service: Cardiovascular;  Laterality: N/A;   THYROID SURGERY      Medications:  Current Outpatient Medications on File Prior to Visit  Medication Sig   alendronate (FOSAMAX) 70 MG tablet Take 1 tablet (70 mg total) by mouth every Sunday. Take with a full glass of water on an empty stomach.   aspirin 81 MG EC tablet TAKE 1 TABLET (81 MG TOTAL) BY MOUTH DAILY. (Patient taking differently: Take 81 mg by mouth daily. TAKE 1 TABLET (81 MG TOTAL) BY MOUTH DAILY.)   atorvastatin (LIPITOR) 80 MG tablet Take by mouth.   metoprolol tartrate (LOPRESSOR) 25 MG tablet SMARTSIG:1 Tablet(s) By Mouth Every 12 Hours   nitroGLYCERIN (NITROSTAT) 0.4 MG SL tablet Place 0.4 mg under the tongue every 5 (five) minutes as needed for chest pain.   pantoprazole (PROTONIX) 20 MG tablet Take by mouth.   No current facility-administered medications on file prior to visit.    Allergies:  No Known Allergies  Social History:  Social History   Socioeconomic History   Marital status: Divorced    Spouse name: Not on file   Number of children: Not on file   Years of education: Not on file   Highest education level: Not on file  Occupational History   Occupation: retired   Occupation: part Teacher, early years/pre  Tobacco Use   Smoking status: Former    Packs/day: 1.50    Years: 22.00    Pack years: 33.00    Types: Cigarettes    Quit date: 05/08/2014    Years since quitting: 7.1   Smokeless tobacco: Never   Tobacco comments:    Smoked over 25 years, quit after Heart Attack in 2015.    Vaping Use   Vaping Use: Never used  Substance and Sexual Activity   Alcohol use: No    Alcohol/week: 0.0 standard drinks   Drug use: No   Sexual activity: Not Currently  Other  Topics Concern   Not on file  Social History Narrative   Not on file   Social Determinants of Health   Financial Resource Strain: Low Risk    Difficulty of Paying Living Expenses: Not very hard  Food Insecurity: No Food Insecurity   Worried About Running Out of Food in the Last Year: Never true   Ran Out of Food in the Last Year: Never true  Transportation Needs: No Transportation Needs   Lack of Transportation (Medical): No   Lack of Transportation (Non-Medical): No  Physical Activity: Sufficiently Active   Days of Exercise per Week: 7 days   Minutes of Exercise per Session: 60 min  Stress: Stress Concern Present   Feeling of Stress : Rather much  Social Connections: Socially Isolated   Frequency of Communication with Friends and Family: More than three times a week   Frequency of  Social Gatherings with Friends and Family: Three times a week   Attends Religious Services: Never   Active Member of Clubs or Organizations: No   Attends Archivist Meetings: Never   Marital Status: Divorced  Human resources officer Violence: Not At Risk   Fear of Current or Ex-Partner: No   Emotionally Abused: No   Physically Abused: No   Sexually Abused: No   Social History   Tobacco Use  Smoking Status Former   Packs/day: 1.50   Years: 22.00   Pack years: 33.00   Types: Cigarettes   Quit date: 05/08/2014   Years since quitting: 7.1  Smokeless Tobacco Never  Tobacco Comments   Smoked over 25 years, quit after Heart Attack in 2015.     Social History   Substance and Sexual Activity  Alcohol Use No   Alcohol/week: 0.0 standard drinks    Family History:  Family History  Problem Relation Age of Onset   Liver cancer Brother    Heart Problems Mother    Breast cancer Neg Hx     Past medical history, surgical history, medications, allergies, family history and social history reviewed with patient today and changes made to appropriate areas of the chart.   Review of Systems   Constitutional: Negative.   HENT: Negative.    Eyes: Negative.   Respiratory: Negative.    Cardiovascular:  Positive for chest pain (surgical scar) and leg swelling. Negative for palpitations, orthopnea, claudication and PND.  Gastrointestinal: Negative.   Genitourinary: Negative.   Musculoskeletal: Negative.   Skin: Negative.   Neurological: Negative.   Endo/Heme/Allergies: Negative.   Psychiatric/Behavioral: Negative.    All other ROS negative except what is listed above and in the HPI.      Objective:    BP (!) 153/76   Pulse 73   Ht 5\' 3"  (1.6 m)   Wt 139 lb (63 kg)   BMI 24.62 kg/m   Wt Readings from Last 3 Encounters:  06/23/21 139 lb (63 kg)  02/06/21 143 lb (64.9 kg)  11/11/20 147 lb (66.7 kg)    Physical Exam Vitals and nursing note reviewed.  Constitutional:      General: She is not in acute distress.    Appearance: Normal appearance. She is not ill-appearing, toxic-appearing or diaphoretic.  HENT:     Head: Normocephalic and atraumatic.     Right Ear: Tympanic membrane, ear canal and external ear normal. There is no impacted cerumen.     Left Ear: Tympanic membrane, ear canal and external ear normal. There is no impacted cerumen.     Nose: Nose normal. No congestion or rhinorrhea.     Mouth/Throat:     Mouth: Mucous membranes are moist.     Pharynx: Oropharynx is clear. No oropharyngeal exudate or posterior oropharyngeal erythema.  Eyes:     General: No scleral icterus.       Right eye: No discharge.        Left eye: No discharge.     Extraocular Movements: Extraocular movements intact.     Conjunctiva/sclera: Conjunctivae normal.     Pupils: Pupils are equal, round, and reactive to light.  Neck:     Vascular: No carotid bruit.  Cardiovascular:     Rate and Rhythm: Normal rate and regular rhythm.     Pulses: Normal pulses.     Heart sounds: No murmur heard.   No friction rub. No gallop.  Pulmonary:     Effort: Pulmonary effort is normal.  No  respiratory distress.     Breath sounds: Normal breath sounds. No stridor. No wheezing, rhonchi or rales.  Chest:     Chest wall: No tenderness.  Abdominal:     General: Abdomen is flat. Bowel sounds are normal. There is no distension.     Palpations: Abdomen is soft. There is no mass.     Tenderness: There is no abdominal tenderness. There is no right CVA tenderness, left CVA tenderness, guarding or rebound.     Hernia: No hernia is present.  Genitourinary:    Comments: Breast and pelvic exams deferred with shared decision making Musculoskeletal:        General: No swelling, tenderness, deformity or signs of injury.     Cervical back: Normal range of motion and neck supple. No rigidity. No muscular tenderness.     Right lower leg: No edema.     Left lower leg: No edema.  Lymphadenopathy:     Cervical: No cervical adenopathy.  Skin:    General: Skin is warm and dry.     Capillary Refill: Capillary refill takes less than 2 seconds.     Coloration: Skin is not jaundiced or pale.     Findings: No bruising, erythema, lesion or rash.  Neurological:     General: No focal deficit present.     Mental Status: She is alert and oriented to person, place, and time. Mental status is at baseline.     Cranial Nerves: No cranial nerve deficit.     Sensory: No sensory deficit.     Motor: No weakness.     Coordination: Coordination normal.     Gait: Gait normal.     Deep Tendon Reflexes: Reflexes normal.  Psychiatric:        Mood and Affect: Mood normal.        Behavior: Behavior normal.        Thought Content: Thought content normal.        Judgment: Judgment normal.    Results for orders placed or performed during the hospital encounter of 01/13/21  SARS CORONAVIRUS 2 (TAT 6-24 HRS) Nasopharyngeal Nasopharyngeal Swab   Specimen: Nasopharyngeal Swab  Result Value Ref Range   SARS Coronavirus 2 NEGATIVE NEGATIVE      Assessment & Plan:   Problem List Items Addressed This Visit        Cardiovascular and Mediastinum   CAD (coronary artery disease)    Continue to follow with cardiology. Call with any concerns. Will keep BP and cholesterol continue diet and exercise. Call with any concerns.       Relevant Orders   CBC with Differential/Platelet   Comprehensive metabolic panel   Aortic atherosclerosis ([place])    Continue to follow with cardiology. Call with any concerns. Will keep BP and cholesterol continue diet and exercise. Call with any concerns.       Relevant Orders   CBC with Differential/Platelet   Comprehensive metabolic panel   Senile purpura ([place])    Reassured patient. Continue to monitor.         Musculoskeletal and Integument   Osteoporosis    Due for recheck on DEXA. Ordered today.         Genitourinary   Benign hypertensive renal disease    Under good control on current regimen. Continue current regimen. Continue to monitor. Call with any concerns. Refills up to date. Labs drawn today.       Relevant Orders   CBC with Differential/Platelet   Comprehensive  metabolic panel   Urinalysis, Routine w reflex microscopic   TSH   Microalbumin, Urine Waived     Other   Hyperlipidemia    Under good control on current regimen. Continue current regimen. Continue to monitor. Call with any concerns. Refills up to date. Labs drawn today.      Relevant Orders   CBC with Differential/Platelet   Comprehensive metabolic panel   Lipid Panel w/o Chol/HDL Ratio   Depression, major, recurrent, in remission ([place])    Under good control on current regimen. Continue current regimen. Continue to monitor. Call with any concerns. Refills up to date. Labs drawn today.      Other Visit Diagnoses     Routine general medical examination at a health care facility    -  Primary   Vaccines declined/up to date. Screening labs checked today. Mammo and DEXA ordered today. Colonoscopy up to date. Continue diet and exercise. Call with concerns   Encounter for screening  mammogram for malignant neoplasm of breast       Mammogram ordered.    Relevant Orders   MM 3D SCREEN BREAST BILATERAL   Screening for osteoporosis       DEXA ordered.    Relevant Orders   DG Bone Density        Follow up plan: Return in about 6 months (around 12/21/2021).   LABORATORY TESTING:  - Pap smear: not applicable  IMMUNIZATIONS:   - Tdap: Tetanus vaccination status reviewed: last tetanus booster within 10 years. - Influenza: Refused - Pneumovax: Refused - Prevnar: Refused - HPV: Up to date - Zostavax vaccine: Refused  SCREENING: -Mammogram: Ordered today  - Colonoscopy: Up to date  - Bone Density: Ordered today   PATIENT COUNSELING:   Advised to take 1 mg of folate supplement per day if capable of pregnancy.   Sexuality: Discussed sexually transmitted diseases, partner selection, use of condoms, avoidance of unintended pregnancy  and contraceptive alternatives.   Advised to avoid cigarette smoking.  I discussed with the patient that most people either abstain from alcohol or drink within safe limits (<=14/week and <=4 drinks/occasion for males, <=7/weeks and <= 3 drinks/occasion for females) and that the risk for alcohol disorders and other health effects rises proportionally with the number of drinks per week and how often a drinker exceeds daily limits.  Discussed cessation/primary prevention of drug use and availability of treatment for abuse.   Diet: Encouraged to adjust caloric intake to maintain  or achieve ideal body weight, to reduce intake of dietary saturated fat and total fat, to limit sodium intake by avoiding high sodium foods and not adding table salt, and to maintain adequate dietary potassium and calcium preferably from fresh fruits, vegetables, and low-fat dairy products.    stressed the importance of regular exercise  Injury prevention: Discussed safety belts, safety helmets, smoke detector, smoking near bedding or upholstery.   Dental  health: Discussed importance of regular tooth brushing, flossing, and dental visits.    NEXT PREVENTATIVE PHYSICAL DUE IN 1 YEAR. Return in about 6 months (around 12/21/2021).

## 2021-06-23 NOTE — Patient Instructions (Signed)
Call to schedule your mammogram and bone density: [facility]  Address: [address], [place], [place] [address]  Phone: [phone]

## 2021-06-24 LAB — COMPREHENSIVE METABOLIC PANEL
ALT: 11 IU/L (ref 0–32)
AST: 16 IU/L (ref 0–40)
Albumin/Globulin Ratio: 2.3 — ABNORMAL HIGH (ref 1.2–2.2)
Albumin: 4.4 g/dL (ref 3.8–4.8)
Alkaline Phosphatase: 130 IU/L — ABNORMAL HIGH (ref 44–121)
BUN/Creatinine Ratio: 11 — ABNORMAL LOW (ref 12–28)
BUN: 9 mg/dL (ref 8–27)
Bilirubin Total: 1.7 mg/dL — ABNORMAL HIGH (ref 0.0–1.2)
CO2: 21 mmol/L (ref 20–29)
Calcium: 8.9 mg/dL (ref 8.7–10.3)
Chloride: 109 mmol/L — ABNORMAL HIGH (ref 96–106)
Creatinine, Ser: 0.81 mg/dL (ref 0.57–1.00)
Globulin, Total: 1.9 g/dL (ref 1.5–4.5)
Glucose: 81 mg/dL (ref 70–99)
Potassium: 4 mmol/L (ref 3.5–5.2)
Sodium: 145 mmol/L — ABNORMAL HIGH (ref 134–144)
Total Protein: 6.3 g/dL (ref 6.0–8.5)
eGFR: 78 mL/min/{1.73_m2} (ref >59)

## 2021-06-24 LAB — CBC WITH DIFFERENTIAL/PLATELET
Basophils Absolute: 0 10*3/uL (ref 0.0–0.2)
Basos: 0 %
EOS (ABSOLUTE): 0.3 10*3/uL (ref 0.0–0.4)
Eos: 3 %
Hematocrit: 33.9 % — ABNORMAL LOW (ref 34.0–46.6)
Hemoglobin: 11.1 g/dL (ref 11.1–15.9)
Immature Grans (Abs): 0 10*3/uL (ref 0.0–0.1)
Immature Granulocytes: 0 %
Lymphocytes Absolute: 1.7 10*3/uL (ref 0.7–3.1)
Lymphs: 21 %
MCH: 29.4 pg (ref 26.6–33.0)
MCHC: 32.7 g/dL (ref 31.5–35.7)
MCV: 90 fL (ref 79–97)
Monocytes Absolute: 0.6 10*3/uL (ref 0.1–0.9)
Monocytes: 7 %
Neutrophils Absolute: 5.7 10*3/uL (ref 1.4–7.0)
Neutrophils: 69 %
Platelets: 278 10*3/uL (ref 150–450)
RBC: 3.77 x10E6/uL (ref 3.77–5.28)
RDW: 13.2 % (ref 11.7–15.4)
WBC: 8.3 10*3/uL (ref 3.4–10.8)

## 2021-06-24 LAB — TSH: TSH: 2.74 u[IU]/mL (ref 0.450–4.500)

## 2021-06-24 LAB — LIPID PANEL W/O CHOL/HDL RATIO
Cholesterol, Total: 158 mg/dL (ref 100–199)
HDL: 46 mg/dL (ref >39)
LDL Chol Calc (NIH): 87 mg/dL (ref 0–99)
Triglycerides: 143 mg/dL (ref 0–149)
VLDL Cholesterol Cal: 25 mg/dL (ref 5–40)

## 2021-06-27 ENCOUNTER — Encounter: Payer: Self-pay | Admitting: Family Medicine

## 2021-06-27 DIAGNOSIS — F339 Major depressive disorder, recurrent, unspecified: Secondary | ICD-10-CM | POA: Diagnosis not present

## 2021-06-27 DIAGNOSIS — I10 Essential (primary) hypertension: Secondary | ICD-10-CM | POA: Diagnosis not present

## 2021-06-27 DIAGNOSIS — I25119 Atherosclerotic heart disease of native coronary artery with unspecified angina pectoris: Secondary | ICD-10-CM

## 2021-06-27 DIAGNOSIS — E782 Mixed hyperlipidemia: Secondary | ICD-10-CM | POA: Diagnosis not present

## 2021-08-05 ENCOUNTER — Telehealth: Payer: Medicare Other | Admitting: General Practice

## 2021-08-05 ENCOUNTER — Ambulatory Visit (INDEPENDENT_AMBULATORY_CARE_PROVIDER_SITE_OTHER): Payer: Medicare Other

## 2021-08-05 DIAGNOSIS — I25119 Atherosclerotic heart disease of native coronary artery with unspecified angina pectoris: Secondary | ICD-10-CM

## 2021-08-05 DIAGNOSIS — E782 Mixed hyperlipidemia: Secondary | ICD-10-CM

## 2021-08-05 DIAGNOSIS — F419 Anxiety disorder, unspecified: Secondary | ICD-10-CM

## 2021-08-05 DIAGNOSIS — I10 Essential (primary) hypertension: Secondary | ICD-10-CM

## 2021-08-05 DIAGNOSIS — F3340 Major depressive disorder, recurrent, in remission, unspecified: Secondary | ICD-10-CM

## 2021-08-05 NOTE — Patient Instructions (Addendum)
Visit Information  Patient will self administer medications as prescribed as evidenced by self report/primary caregiver report  Patient will attend all scheduled provider appointments as evidenced by clinician review of documented attendance to scheduled appointments and patient/caregiver report Patient will call pharmacy for medication refills as evidenced by patient report and review of pharmacy fill history as appropriate Patient will attend church or other social activities as evidenced by patient report Patient will continue to perform ADL's independently as evidenced by patient/caregiver report Patient will continue to perform IADL's independently as evidenced by patient/caregiver report Patient will call provider office for new concerns or questions as evidenced by review of documented incoming telephone call notes and patient report Patient will work with BSW to address care coordination needs and will continue to work with the clinical team to address health care and disease management related needs as evidenced by documented adherence to scheduled care management/care coordination appointments - check blood pressure 3 times per week - choose a place to take my blood pressure (home, clinic or office, retail store) - write blood pressure results in a log or diary - learn about high blood pressure - keep a blood pressure log - take blood pressure log to all doctor appointments - call doctor for signs and symptoms of high blood pressure - develop an action plan for high blood pressure - keep all doctor appointments - take medications for blood pressure exactly as prescribed - report new symptoms to your doctor - eat more whole grains, fruits and vegetables, lean meats and healthy fats - call for medicine refill 2 or 3 days before it runs out - take all medications exactly as prescribed - call doctor with any symptoms you believe are related to your medicine - call doctor when you  experience any new symptoms - go to all doctor appointments as scheduled - adhere to prescribed diet: heart healthy diet   08-05-2021: Call made back to the patient and reviewed the number to call to schedule her DEXA scan for bone density test. The patient verbalized understanding and will call and schedule an appointment.  The patient verbalized understanding of instructions, educational materials, and care plan provided today and declined offer to receive copy of patient instructions, educational materials, and care plan.   Telephone follow up appointment with care management team member scheduled for: 10-07-2021 at 0900 am  Noreene Larsson RN, MSN, [facility] Mobile: [phone]

## 2021-08-05 NOTE — Chronic Care Management (AMB) (Addendum)
Chronic Care Management   CCM RN Visit Note  08/05/2021 Name: Tonya Cabrera MRN: [id number] DOB: 1951/05/31  Subjective: Tonya Cabrera is a 70 y.o. year old female who is a primary care patient of Valerie Roys, DO. The care management team was consulted for assistance with disease management and care coordination needs.    Engaged with patient by telephone for follow up visit in response to provider referral for case management and/or care coordination services.   Consent to Services:  The patient was given information about Chronic Care Management services, agreed to services, and gave verbal consent prior to initiation of services.  Please see initial visit note for detailed documentation.   Patient agreed to services and verbal consent obtained.   Assessment: Review of patient past medical history, allergies, medications, health status, including review of consultants reports, laboratory and other test data, was performed as part of comprehensive evaluation and provision of chronic care management services.   SDOH (Social Determinants of Health) assessments and interventions performed:    CCM Care Plan  No Known Allergies  Outpatient Encounter Medications as of 08/05/2021  Medication Sig   alendronate (FOSAMAX) 70 MG tablet Take 1 tablet (70 mg total) by mouth every Sunday. Take with a full glass of water on an empty stomach.   aspirin 81 MG EC tablet TAKE 1 TABLET (81 MG TOTAL) BY MOUTH DAILY. (Patient taking differently: Take 81 mg by mouth daily. TAKE 1 TABLET (81 MG TOTAL) BY MOUTH DAILY.)   atorvastatin (LIPITOR) 80 MG tablet Take by mouth.   metoprolol tartrate (LOPRESSOR) 25 MG tablet SMARTSIG:1 Tablet(s) By Mouth Every 12 Hours   nitroGLYCERIN (NITROSTAT) 0.4 MG SL tablet Place 0.4 mg under the tongue every 5 (five) minutes as needed for chest pain.   pantoprazole (PROTONIX) 20 MG tablet Take by mouth.   No facility-administered encounter medications on file as of  08/05/2021.    Patient Active Problem List   Diagnosis Date Noted   Senile purpura ([place]) 06/23/2021   Overactive bladder 06/19/2019   Urge incontinence of urine 06/19/2019   Advance directive discussed with patient 05/02/2019   Diverticulosis of large intestine without diverticulitis    Depression, major, recurrent, in remission ([place]) 03/04/2018   Symptomatic anemia 02/19/2018   Personal history of tobacco use, presenting hazards to health 07/11/2016   Aortic atherosclerosis ([place]) 07/09/2016   Osteoporosis 07/07/2016   Myopia of both eyes 06/16/2016   Hyperlipidemia 06/08/2016   CAD (coronary artery disease) 06/08/2016   Snoring 06/08/2016   Benign hypertensive renal disease 11/14/2015    Conditions to be addressed/monitored:CAD, HTN, HLD, Anxiety, and Depression  Care Plan : RNCM: Coronary Artery Disease (Adult) and HLD  Updates made by Vanita Ingles, RN since 08/05/2021 12:00 AM  Completed 08/05/2021   Problem: RNCM: Disease Progression (Coronary Artery Disease) Resolved 08/05/2021  Priority: Medium     Long-Range Goal: RNCM: Disease Progression Prevented or Minimized Completed 08/05/2021  Start Date: 11/20/2020  Expected End Date: 03/17/2022  Recent Progress: On track  Priority: Medium  Note:   Current Barriers: Resolving, duplicate goal  Poorly controlled hyperlipidemia, complicated by active chest pain- seeing pcp and specialist Current antihyperlipidemic regimen: atorvastatin 40 mg QD Most recent lipid panel:     Component Value Date/Time   CHOL 192 11/11/2020 1332   CHOL 235 (H) 06/08/2016 0953   CHOL 192 05/16/2014 0020   TRIG 208 (H) 11/11/2020 1332   TRIG 147 06/08/2016 0953   TRIG 106 05/16/2014  0020   HDL 57 11/11/2020 1332   HDL 48 05/16/2014 0020   CHOLHDL 3.3 04/09/2016 1828   VLDL 29 06/08/2016 0953   VLDL 21 05/16/2014 0020   LDLCALC 100 (H) 11/11/2020 1332   LDLCALC 123 (H) 05/16/2014 0020   ASCVD risk enhancing conditions: age >82, CAD, former  MI,  HTN, former smoker Unable to independently manage new onset of chest pain, HLD and CAD Lacks social connections Does not contact provider office for questions/concerns  RN Care Manager Clinical Goal(s):  patient will work with Consulting civil engineer, providers, and care team towards execution of optimized self-health management plan patient will verbalize understanding of plan for effective management of chest pain and discomfort and on going support of chronic conditions of CAD and HLD  patient will work with RNCM, pcp and specialist  to address needs related to new onset of chest pain, HLD, and CAD patient will demonstrate a decrease in chest pain  exacerbations as evidenced by finding etiology of chest pain, adherence to heart healthy diet and taking medications as ordered  patient will attend all scheduled medical appointments: office to call and reschedule missed appointment after recent hospitalization for CAD bypass surgery. In basket message sent to the pcp office staff.   Interventions: Collaboration with Valerie Roys, DO regarding development and update of comprehensive plan of care as evidenced by provider attestation and co-signature Inter-disciplinary care team collaboration (see longitudinal plan of care) Medication review performed; medication list updated in electronic medical record.  Inter-disciplinary care team collaboration (see longitudinal plan of care) Referred to pharmacy team for assistance with HLD and CAD medication management Evaluation of current treatment plan related to HLD and CAD and patient's adherence to plan as established by provider. 01-15-2021: The patient is doing well and denies any concerns. She is having a heart catheterization on 01-29-2021. She says she is having to use the NTG more often. Stress test and other testing for heart function came back abnormal. The patient just wants to get it over with and find out what is going on. 02/26/2021 Patient had  Coronary Angiography procedure done on 02/06/2021.  Next step is for patient to have Coronary Angioplasty performed on 03/18/2021 with possible future by-pass surgery if needed. Patient continues to have chest pain with activity and uses NTG tabs PRN.  Pt verbalized understanding of NTG tab use and when to activate 911 for unrelieved chest pain with NTG use. 06-04-2021: The patient is doing well and states each day she is getting better. She had CAD bypass surgery and was in the hospital from 05-07-2021 to 05-14-2021. The patient states that while she was in the hospital she had an UTI and was treated for that. She also had some dysphagia after intubation and lost 10 pounds but is eating well and feels like she has gained all that weight back now. She states the area of the surgical site is a little sore but is getting better each day. Review of sx and sx to monitor for infection. The patient verbalized understanding.  Advised patient to call the office for changes in condition or questions, seek emergent care for Chest pain not resolved by NTG, if having chest pain call pcp or specialist and notify of increased incidence of chest pain or discomfort. 01-15-2021: The patient instructed to seek emergent care for unresolved chest pain with NTG. She has seen her cardiologist also and instructed also to seek emergent care for chest pain unresolved with taking NTG. Education also  on pacing activity and limiting heavy work. 02/26/2021 Reviewed this education with the patient. 06-04-2021: Denies any CP or other issues related to cardiac health at this time.  Provided education to patient re: taking NTG for new onset of chest pain, seeking emergent care if needed, pacing activity, adherence to provider appointments, and eating heart healthy diet. 01-15-2021. Review with the patient 02/26/2021 Reviewed this information with the patient. 06-04-2021: The patient knows how and when to use NTG Reviewed scheduled/upcoming provider appointments  including: The patient missed an appointment with the pcp after hospitalization. The patient states she needs a follow up with blood work with Dr. Wynetta Emery. In basket message sent to clinical/admin staff at [facility] to assist patient with getting an appointment for follow up.  Discussed plans with patient for ongoing care management follow up and provided patient with direct contact information for care management team   Patient Goals/Self-Care Activities: patient will:   - call for medicine refill 2 or 3 days before it runs out - call if I am sick and can't take my medicine - keep a list of all the medicines I take; vitamins and herbals too - learn to read medicine labels - use a pillbox to sort medicine - use an alarm clock or phone to remind me to take my medicine - change to whole grain breads, cereal, pasta - drink 6 to 8 glasses of water each day - eat 3 to 5 servings of fruits and vegetables each day - eat 5 or 6 small meals each day - fill half the plate with nonstarchy vegetables - limit fast food meals to no more than 1 per week - manage portion size - prepare main meal at home 3 to 5 days each week - read food labels for fat, fiber, carbohydrates and portion size - be open to making changes - I can manage, know and watch for signs of a heart attack - if I have chest pain, call for help -use NTG as needed for new onset of chest pain - learn about small changes that will make a big difference - learn my personal risk factors   Follow Up Plan: Telephone follow up appointment with care management team member scheduled for: 08-05-2021 at [place] : RNCM: Hypertension (Adult)  Updates made by Vanita Ingles, RN since 08/05/2021 12:00 AM  Completed 08/05/2021   Problem: RNCM: Hypertension (Hypertension) Resolved 08/05/2021  Priority: Medium     Long-Range Goal: RNCM: Hypertension Monitored Completed 08/05/2021  Start Date: 11/20/2020  Expected End Date: 03/27/2022   Recent Progress: On track  Priority: Medium  Note:   Objective: Resolving, duplicate goal  Last practice recorded BP readings:  BP Readings from Last 3 Encounters:  02/06/21 121/77  11/11/20 (!) 145/75  05/10/20 120/68     Most recent eGFR/CrCl: No results found for: EGFR  No components found for: CRCL Current Barriers:  Knowledge Deficits related to basic understanding of hypertension pathophysiology and self care management Knowledge Deficits related to understanding of medications prescribed for management of hypertension Limited Social Support Lacks social connections Does not contact provider office for questions/concerns Case Manager Clinical Goal(s):  patient will verbalize understanding of plan for hypertension management  patient will attend all scheduled medical appointments: 05-12-2021  patient will demonstrate improved adherence to prescribed treatment plan for hypertension as evidenced by taking all medications as prescribed, monitoring and recording blood pressure as directed, adhering to low sodium/DASH diet  patient will demonstrate  improved health management independence as evidenced by checking blood pressure as directed and notifying PCP if SBP>160 or DBP > 90, taking all medications as prescribe, and adhering to a low sodium diet as discussed.  patient will verbalize basic understanding of hypertension disease process and self health management plan as evidenced by compliance with medications, compliance with dietary restrictions, and working with the CCM team to manage health and well being  Interventions:  Collaboration with Valerie Roys, DO regarding development and update of comprehensive plan of care as evidenced by provider attestation and co-signature Inter-disciplinary care team collaboration (see longitudinal plan of care) Evaluation of current treatment plan related to hypertension self management and patient's adherence to plan as established by provider.  01-15-2021: The patient states her blood pressure is doing well. She is eating good and monitoring sodium and fats. Will continue to monitor for changes. 02/26/2021: The patient is not checking her blood pressure on a regular basis but will check it periodically, especially when she feels light-headed.  Otherwise she thinks her blood pressure is doing well. 06-04-2021: The patient states she was having issues with her blood pressure dropping too low after her CAD bypass because of medications. The patient states they have that all sorted out now and her blood pressure at [facility] last week was "perfect". Denies any issues with HTN at this time. Will continue to monitor.  Provided education to patient re: stroke prevention, s/s of heart attack and stroke, DASH diet, complications of uncontrolled blood pressure. 06-04-2021: Reviewed this education with the patient. Patient verbalized understanding. Reviewed medications with patient and discussed importance of compliance. 01-15-2021- The patient is compliant with medications. 06-04-2021: The patient continues to be compliant with her medications. Discussed plans with patient for ongoing care management follow up and provided patient with direct contact information for care management team Advised patient, providing education and rationale, to monitor blood pressure daily and record, calling PCP for findings outside established parameters.  Reviewed scheduled/upcoming provider appointments including: Missed appointment after hospitalization. In basket message to [facility] staff to call the patient to get a follow up appointment scheduled for the patient.  Patient Goals: - blood pressure trends reviewed - depression screen reviewed - home or ambulatory blood pressure monitoring encouraged Self-Care Activities: - Self administers medications as prescribed Attends all scheduled provider appointments Calls provider office for new concerns, questions, or BP outside discussed  parameters Checks BP and records as discussed Follows a low sodium diet/DASH diet - blood pressure trends reviewed - depression screen reviewed - home or ambulatory blood pressure monitoring encouraged Follow Up Plan: Telephone follow up appointment with care management team member scheduled for: 08-05-2021 at [place] : RNCM: Depression (Adult) and Anxiety  Updates made by Vanita Ingles, RN since 08/05/2021 12:00 AM  Completed 08/05/2021   Problem: RNCM: Depression Identification (Depression) and anxiety Resolved 08/05/2021  Priority: Medium     Long-Range Goal: RNCM: Depressive Symptoms Identified/Anxiety triggers Completed 08/05/2021  Start Date: 11/20/2020  Expected End Date: 03/17/2022  Recent Progress: On track  Priority: Medium  Note:   Current Barriers: Resolving, duplicate goal  Chronic Disease Management support and education needs related to depression and anxiety  Lacks caregiver support.  Lacks social connections Does not contact provider office for questions/concerns  Nurse Case Manager Clinical Goal(s):  patient will verbalize understanding of plan for effective management of depression and anxiety  patient will work with RNCM and pcp  to address needs related to  increased anxiety and depression  patient will demonstrate a decrease in anxiety and depression  exacerbations as evidenced by resolution of chest pain, normalized mood, improved health and well being patient will attend all scheduled medical appointments: office to call for new appointment  Interventions:  1:1 collaboration with Valerie Roys, DO regarding development and update of comprehensive plan of care as evidenced by provider attestation and co-signature Inter-disciplinary care team collaboration (see longitudinal plan of care) Evaluation of current treatment plan related to depression and anxiety  and patient's adherence to plan as established by provider. 01-15-2021: The patient is a  little stressed because of her upcoming heart catheterization. She was supposed to have it today but they had to reschedule it. She states she is doing okay just wants to have it over. Is concerned because of the abnormal testing results and also having a heart attack about 6 years ago. The patient expressed her concerns and feelings. Emotional support given. 02/26/2021 The patient feels she is doing ok presently and just awaiting upcoming angioplasty procedure on 03/18/2021.  She enjoys working in her vegetable and flower gardens which bring her joy.  Patient states she is not a good sleeper and uses OTC low dose sleeping aid plus Melatonin to sleep a few hours each night. It appears patient is in a good place currently. 06-04-2021: The patient is doing much better since having her CAD bypass. She still is not sleeping well but using OTC medications. She feels she is getting stronger every day and happy to be home. States she did not eat well while in the hospital but has gotten her appetite back since being home and her daughter was cooking for her. She feels very blessed as she has an amazing support system. Will continue to monitor.  Advised patient to call the office for changes in mood, increased anxiety or depression  Provided education to patient re: pacing activity, getting rest, monitoring for triggers Reviewed medications with patient and discussed compliance  Provided patient with depression and anxiety  educational materials related to reducing stress and how increased anxiety and depression contribute to negative outcomes with health and well being  Discussed plans with patient for ongoing care management follow up and provided patient with direct contact information for care management team  Patient Goals/Self-Care Activities  patient will:  - Patient will self administer medications as prescribed Patient will attend all scheduled provider appointments Patient will call pharmacy for medication  refills Patient will attend church or other social activities Patient will continue to perform ADL's independently Patient will continue to perform IADL's independently Patient will call provider office for new concerns or questions Patient will work with BSW to address care coordination needs and will continue to work with the clinical team to address health care and disease management related needs.    - anxiety screen reviewed - depression screen reviewed - medication list reviewed Follow Up Plan: Telephone follow up appointment with care management team member scheduled for: 08-05-2021 at [place] : RNCM: General Plan of Care (Adult) for chronic Disease Management and Care Coordination Needs  Updates made by Vanita Ingles, RN since 08/05/2021 12:00 AM     Problem: RNCM: Development of Plan of Care for Chronic Disease Management (CAD, HTN, HLD, Depression, Anxiety)   Priority: High     Long-Range Goal: RNCM: Effective Management  of Plan of Care for Chronic Disease Management (CAD, HTN, HLD, Depression, Anxiety)  Start Date: 08/05/2021  Expected End Date: 08/05/2022  Priority: High  Note:   Current Barriers:  Knowledge Deficits related to plan of care for management of CAD, HTN, HLD, and Anxiety with Excessive Worry, Social Anxiety, and Depression: depressed mood anxiety  Chronic Disease Management support and education needs related to CAD, HTN, HLD, and Anxiety with Excessive Worry, and Depression: depressed mood anxiety  RNCM Clinical Goal(s):  Patient will verbalize understanding of plan for management of CAD, HTN, HLD, Anxiety, and Depression as evidenced by following recommendations of the provider, taking medications as directed, compliance with heart healthy diet and working with the CCM team to optimize health and well being take all medications exactly as prescribed and will call provider for medication related questions as evidenced by compliance with  medications and calling for refills before running out of medications    attend all scheduled medical appointments: 12-22-2021 at 11 am as evidenced by keeping appointments and calling for changes and needs warranting a sooner appointment        demonstrate improved and ongoing adherence to prescribed treatment plan for CAD, HTN, HLD, Anxiety, and Depression as evidenced by stable conditions without exacerbations demonstrate a decrease in CAD, HTN, HLD, Anxiety, and Depression exacerbations  as evidenced by following the plan of care, calling the office for changes, and reaching out to the CCM team for changes or needs demonstrate ongoing self health care management ability for effective management of chronic conditions as evidenced by  working with the CCM team through collaboration with Consulting civil engineer, provider, and care team.   Interventions: 1:1 collaboration with primary care provider regarding development and update of comprehensive plan of care as evidenced by provider attestation and co-signature Inter-disciplinary care team collaboration (see longitudinal plan of care) Evaluation of current treatment plan related to  self management and patient's adherence to plan as established by provider   SDOH Barriers (Status: Goal on Track (progressing): YES.) Long Term Goal  Patient interviewed and SDOH assessment performed        Patient interviewed and appropriate assessments performed Provided patient with information about SDOH  and resources in [facility] and care guides to assist with new needs or concerns Discussed plans with patient for ongoing care management follow up and provided patient with direct contact information for care management team Advised patient to call the office for changes in [place], new concerns, or questions 08-05-2021: Call made back to the patient and reviewed the number to call to schedule her DEXA scan for bone density test. The patient verbalized understanding  and will call and schedule an appointment.    CAD  (Status: Goal on Track (progressing): YES.) Long Term Goal  Assessed understanding of CAD diagnosis Medications reviewed including medications utilized in CAD treatment plan Provided education on importance of blood pressure control in management of CAD; Provided education on Importance of limiting foods high in cholesterol; Counseled on importance of regular laboratory monitoring as prescribed; Counseled on the importance of exercise goals with target of 150 minutes per week Reviewed Importance of taking all medications as prescribed Reviewed Importance of attending all scheduled provider appointments Advised to report any changes in symptoms or exercise tolerance EF 50% 05-09-2021  Depression and Anxiety  (Status: Goal on Track (progressing): YES.) Long Term Goal  Evaluation of current treatment plan related to Anxiety and Depression,  self-management and patient's adherence to plan as established by provider. Discussed plans with patient for ongoing care management follow up and provided patient with  direct contact information for care management team Advised patient to call the office for changes in mood, anxiety, and depression ; Provided education to patient re: pacing activiy, listening to her body signals when she needs to rest, discussing insomnia concerns with the pcp; Reviewed medications with patient and discussed compliance. The patient is compliant with medications; Discussed plans with patient for ongoing care management follow up and provided patient with direct contact information for care management team; Advised patient to discuss changes in anxiety and depression and insomnia  with provider; Screening for signs and symptoms of depression related to chronic disease state;  Assessed social determinant of health barriers;   Hyperlipidemia:  (Status: Goal on Track (progressing): YES.) Lab Results  Component Value Date    CHOL 158 06/23/2021   HDL 46 06/23/2021   LDLCALC 87 06/23/2021   TRIG 143 06/23/2021   CHOLHDL 3.3 04/09/2016     Medication review performed; medication list updated in electronic medical record.  Provider established cholesterol goals reviewed; Counseled on importance of regular laboratory monitoring as prescribed; Provided HLD educational materials; Reviewed role and benefits of statin for ASCVD risk reduction; Discussed strategies to manage statin-induced myalgias; Reviewed importance of limiting foods high in cholesterol;  Hypertension: (Status: Goal on Track (progressing): YES.) Last practice recorded BP readings:  BP Readings from Last 3 Encounters:  06/23/21 (!) 153/76  02/06/21 121/77  11/11/20 (!) 145/75  Most recent eGFR/CrCl:  Lab Results  Component Value Date   EGFR 78 06/23/2021    No components found for: CRCL  Evaluation of current treatment plan related to hypertension self management and patient's adherence to plan as established by provider;   Provided education to patient re: stroke prevention, s/s of heart attack and stroke; Reviewed prescribed diet heart healthy Reviewed medications with patient and discussed importance of compliance;  Counseled on adverse effects of illicit drug and excessive alcohol use in patients with high blood pressure;  Discussed plans with patient for ongoing care management follow up and provided patient with direct contact information for care management team; Advised patient, providing education and rationale, to monitor blood pressure daily and record, calling PCP for findings outside established parameters;  Advised patient to discuss blood pressure trends  with provider; Provided education on prescribed diet heart healthy;  Discussed complications of poorly controlled blood pressure such as heart disease, stroke, circulatory complications, vision complications, kidney impairment, sexual dysfunction;   Patient Goals/Self-Care  Activities: Patient will self administer medications as prescribed as evidenced by self report/primary caregiver report  Patient will attend all scheduled provider appointments as evidenced by clinician review of documented attendance to scheduled appointments and patient/caregiver report Patient will call pharmacy for medication refills as evidenced by patient report and review of pharmacy fill history as appropriate Patient will attend church or other social activities as evidenced by patient report Patient will continue to perform ADL's independently as evidenced by patient/caregiver report Patient will continue to perform IADL's independently as evidenced by patient/caregiver report Patient will call provider office for new concerns or questions as evidenced by review of documented incoming telephone call notes and patient report Patient will work with BSW to address care coordination needs and will continue to work with the clinical team to address health care and disease management related needs as evidenced by documented adherence to scheduled care management/care coordination appointments - check blood pressure 3 times per week - choose a place to take my blood pressure (home, clinic or office, retail store) - write blood  pressure results in a log or diary - learn about high blood pressure - keep a blood pressure log - take blood pressure log to all doctor appointments - call doctor for signs and symptoms of high blood pressure - develop an action plan for high blood pressure - keep all doctor appointments - take medications for blood pressure exactly as prescribed - report new symptoms to your doctor - eat more whole grains, fruits and vegetables, lean meats and healthy fats - call for medicine refill 2 or 3 days before it runs out - take all medications exactly as prescribed - call doctor with any symptoms you believe are related to your medicine - call doctor when you experience  any new symptoms - go to all doctor appointments as scheduled - adhere to prescribed diet: heart healthy diet        Plan:Telephone follow up appointment with care management team member scheduled for:  10-07-2021 at 0900 am  [place], MSN, [facility] Mobile: [phone]

## 2021-08-27 DIAGNOSIS — F3340 Major depressive disorder, recurrent, in remission, unspecified: Secondary | ICD-10-CM

## 2021-08-27 DIAGNOSIS — I10 Essential (primary) hypertension: Secondary | ICD-10-CM

## 2021-08-27 DIAGNOSIS — I25119 Atherosclerotic heart disease of native coronary artery with unspecified angina pectoris: Secondary | ICD-10-CM | POA: Diagnosis not present

## 2021-08-27 DIAGNOSIS — E782 Mixed hyperlipidemia: Secondary | ICD-10-CM | POA: Diagnosis not present

## 2021-09-08 DIAGNOSIS — E782 Mixed hyperlipidemia: Secondary | ICD-10-CM | POA: Diagnosis not present

## 2021-09-08 DIAGNOSIS — R079 Chest pain, unspecified: Secondary | ICD-10-CM | POA: Diagnosis not present

## 2021-09-08 DIAGNOSIS — I2510 Atherosclerotic heart disease of native coronary artery without angina pectoris: Secondary | ICD-10-CM | POA: Diagnosis not present

## 2021-09-08 DIAGNOSIS — R0609 Other forms of dyspnea: Secondary | ICD-10-CM | POA: Diagnosis not present

## 2021-09-08 DIAGNOSIS — I519 Heart disease, unspecified: Secondary | ICD-10-CM | POA: Diagnosis not present

## 2021-09-08 DIAGNOSIS — Z951 Presence of aortocoronary bypass graft: Secondary | ICD-10-CM | POA: Diagnosis not present

## 2021-09-08 DIAGNOSIS — Z720 Tobacco use: Secondary | ICD-10-CM | POA: Diagnosis not present

## 2021-09-08 DIAGNOSIS — I2582 Chronic total occlusion of coronary artery: Secondary | ICD-10-CM | POA: Diagnosis not present

## 2021-09-08 DIAGNOSIS — I25118 Atherosclerotic heart disease of native coronary artery with other forms of angina pectoris: Secondary | ICD-10-CM | POA: Diagnosis not present

## 2021-09-08 DIAGNOSIS — E785 Hyperlipidemia, unspecified: Secondary | ICD-10-CM | POA: Diagnosis not present

## 2021-09-08 DIAGNOSIS — I10 Essential (primary) hypertension: Secondary | ICD-10-CM | POA: Diagnosis not present

## 2021-09-08 DIAGNOSIS — Z9889 Other specified postprocedural states: Secondary | ICD-10-CM | POA: Diagnosis not present

## 2021-10-07 ENCOUNTER — Telehealth: Payer: Self-pay

## 2021-10-07 ENCOUNTER — Telehealth: Payer: Medicare Other

## 2021-10-07 NOTE — Telephone Encounter (Signed)
°  Care Management   Follow Up Note   10/07/2021 Name: Tonya Cabrera MRN: [id number] DOB: 1951/06/11   Referred by: Valerie Roys, DO Reason for referral : Chronic Care Management (RNCM: Follow up for Chronic Disease Management and Care Coordination Needs )   An unsuccessful telephone outreach was attempted today. The patient was referred to the case management team for assistance with care management and care coordination.   Follow Up Plan: The care management team will reach out to the patient again over the next 30 days.   Noreene Larsson RN, MSN, [facility] Mobile: [phone]

## 2021-10-21 ENCOUNTER — Telehealth: Payer: Self-pay

## 2021-10-21 NOTE — Chronic Care Management (AMB) (Signed)
°  Care Management   Note  10/21/2021 Name: Tonya Cabrera MRN: [id number] DOB: [birth year]  Tonya Cabrera is a 71 y.o. year old female who is a primary care patient of Valerie Roys, DO and is actively engaged with the care management team. I reached out to [name] by phone today to assist with re-scheduling a follow up visit with the RN Case Manager  Follow up plan: Unsuccessful telephone outreach attempt made.  The care management team will reach out to the patient again over the next 7 days.  If patient returns call to provider office, please advise to call [facility] at [place], [place] [address], [place], [place] [address] Direct Dial: [phone] [email or web address] Website: [email or web address]

## 2021-11-06 NOTE — Chronic Care Management (AMB) (Signed)
°  Care Management   Note  11/06/2021 Name: LALANIA HASEMAN MRN: [id number] DOB: 1951-06-01  ALZENA GERBER is a 71 y.o. year old female who is a primary care patient of Valerie Roys, DO and is actively engaged with the care management team. I reached out to [name] by phone today to assist with re-scheduling a follow up visit with the RN Case Manager  Follow up plan: Unsuccessful telephone outreach attempt made.  The care management team will reach out to the patient again over the next 7 days.  If patient returns call to provider office, please advise to call [place] at [place], [address], [place], [place] [address] Direct Dial: [phone] [email or web address] Website: [email or web address]

## 2021-11-24 NOTE — Chronic Care Management (AMB) (Signed)
°  Care Management   Note  11/24/2021 Name: Tonya Cabrera MRN: [id number] DOB: September 28, 1951  Tonya Cabrera is a 71 y.o. year old female who is a primary care patient of Valerie Roys, DO and is actively engaged with the care management team. I reached out to [name] by phone today to assist with re-scheduling a follow up visit with the RN Case Manager  Follow up plan: Telephone appointment with care management team member scheduled for:12/05/2021  Noreene Larsson, [place], [place], [place] [address] Direct Dial: [phone] [email or web address] Website: [email or web address]

## 2021-11-25 NOTE — Telephone Encounter (Signed)
Pt has been rescheduled. Pt had changed her phone number and wasn't on file.

## 2021-12-05 ENCOUNTER — Ambulatory Visit (INDEPENDENT_AMBULATORY_CARE_PROVIDER_SITE_OTHER): Payer: Medicare Other

## 2021-12-05 ENCOUNTER — Telehealth: Payer: Medicare Other

## 2021-12-05 DIAGNOSIS — F3340 Major depressive disorder, recurrent, in remission, unspecified: Secondary | ICD-10-CM

## 2021-12-05 DIAGNOSIS — I10 Essential (primary) hypertension: Secondary | ICD-10-CM

## 2021-12-05 DIAGNOSIS — E782 Mixed hyperlipidemia: Secondary | ICD-10-CM

## 2021-12-05 DIAGNOSIS — I25119 Atherosclerotic heart disease of native coronary artery with unspecified angina pectoris: Secondary | ICD-10-CM

## 2021-12-05 DIAGNOSIS — F419 Anxiety disorder, unspecified: Secondary | ICD-10-CM

## 2021-12-05 MED ORDER — ATORVASTATIN CALCIUM 80 MG PO TABS: 80.0000 mg | ORAL_TABLET | Freq: Every day | ORAL | 1 refills | Status: DC

## 2021-12-05 NOTE — Chronic Care Management (AMB) (Signed)
Chronic Care Management   CCM RN Visit Note  12/05/2021 Name: Tonya Cabrera MRN: [id number] DOB: 1950-10-07  Subjective: Tonya Cabrera is a 71 y.o. year old female who is a primary care patient of Valerie Roys, DO. The care management team was consulted for assistance with disease management and care coordination needs.    Engaged with patient by telephone for follow up visit in response to provider referral for case management and/or care coordination services.   Consent to Services:  The patient was given information about Chronic Care Management services, agreed to services, and gave verbal consent prior to initiation of services.  Please see initial visit note for detailed documentation.   Patient agreed to services and verbal consent obtained.   Assessment: Review of patient past medical history, allergies, medications, health status, including review of consultants reports, laboratory and other test data, was performed as part of comprehensive evaluation and provision of chronic care management services.   SDOH (Social Determinants of Health) assessments and interventions performed:    CCM Care Plan  No Known Allergies  Outpatient Encounter Medications as of 12/05/2021  Medication Sig   alendronate (FOSAMAX) 70 MG tablet Take 1 tablet (70 mg total) by mouth every Sunday. Take with a full glass of water on an empty stomach.   aspirin 81 MG EC tablet TAKE 1 TABLET (81 MG TOTAL) BY MOUTH DAILY. (Patient taking differently: Take 81 mg by mouth daily. TAKE 1 TABLET (81 MG TOTAL) BY MOUTH DAILY.)   atorvastatin (LIPITOR) 80 MG tablet Take by mouth.   metoprolol tartrate (LOPRESSOR) 25 MG tablet SMARTSIG:1 Tablet(s) By Mouth Every 12 Hours   nitroGLYCERIN (NITROSTAT) 0.4 MG SL tablet Place 0.4 mg under the tongue every 5 (five) minutes as needed for chest pain.   pantoprazole (PROTONIX) 20 MG tablet Take by mouth.   No facility-administered encounter medications on file as of  12/05/2021.    Patient Active Problem List   Diagnosis Date Noted   Senile purpura ([place]) 06/23/2021   Overactive bladder 06/19/2019   Urge incontinence of urine 06/19/2019   Advance directive discussed with patient 05/02/2019   Diverticulosis of large intestine without diverticulitis    Depression, major, recurrent, in remission ([place]) 03/04/2018   Symptomatic anemia 02/19/2018   Personal history of tobacco use, presenting hazards to health 07/11/2016   Aortic atherosclerosis ([place]) 07/09/2016   Osteoporosis 07/07/2016   Myopia of both eyes 06/16/2016   Hyperlipidemia 06/08/2016   CAD (coronary artery disease) 06/08/2016   Snoring 06/08/2016   Benign hypertensive renal disease 11/14/2015    Conditions to be addressed/monitored:CAD, HTN, HLD, Anxiety, and Depression  Care Plan : RNCM: General Plan of Care (Adult) for chronic Disease Management and Care Coordination Needs  Updates made by Vanita Ingles, RN since 12/05/2021 12:00 AM     Problem: RNCM: Development of Plan of Care for Chronic Disease Management (CAD, HTN, HLD, Depression, Anxiety)   Priority: High     Long-Range Goal: RNCM: Effective Management  of Plan of Care for Chronic Disease Management (CAD, HTN, HLD, Depression, Anxiety)   Start Date: 08/05/2021  Expected End Date: 08/05/2022  Priority: High  Note:   Current Barriers:  Knowledge Deficits related to plan of care for management of CAD, HTN, HLD, and Anxiety with Excessive Worry, Social Anxiety, and Depression: depressed mood anxiety  Chronic Disease Management support and education needs related to CAD, HTN, HLD, and Anxiety with Excessive Worry, and Depression: depressed mood anxiety  RNCM Clinical  Goal(s):  Patient will verbalize understanding of plan for management of CAD, HTN, HLD, Anxiety, and Depression as evidenced by following recommendations of the provider, taking medications as directed, compliance with heart healthy diet and working with the CCM  team to optimize health and well being take all medications exactly as prescribed and will call provider for medication related questions as evidenced by compliance with medications and calling for refills before running out of medications    attend all scheduled medical appointments: 12-22-2021 at 11 am as evidenced by keeping appointments and calling for changes and needs warranting a sooner appointment        demonstrate improved and ongoing adherence to prescribed treatment plan for CAD, HTN, HLD, Anxiety, and Depression as evidenced by stable conditions without exacerbations demonstrate a decrease in CAD, HTN, HLD, Anxiety, and Depression exacerbations  as evidenced by following the plan of care, calling the office for changes, and reaching out to the CCM team for changes or needs demonstrate ongoing self health care management ability for effective management of chronic conditions as evidenced by  working with the CCM team through collaboration with Consulting civil engineer, provider, and care team.   Interventions: 1:1 collaboration with primary care provider regarding development and update of comprehensive plan of care as evidenced by provider attestation and co-signature Inter-disciplinary care team collaboration (see longitudinal plan of care) Evaluation of current treatment plan related to  self management and patient's adherence to plan as established by provider   SDOH Barriers (Status: Goal on Track (progressing): YES.) Long Term Goal  Patient interviewed and SDOH assessment performed        Patient interviewed and appropriate assessments performed Provided patient with information about SDOH  and resources in [facility] and care guides to assist with new needs or concerns Discussed plans with patient for ongoing care management follow up and provided patient with direct contact information for care management team Advised patient to call the office for changes in [place], new concerns, or  questions 08-05-2021: Call made back to the patient and reviewed the number to call to schedule her DEXA scan for bone density test. The patient verbalized understanding and will call and schedule an appointment.     CAD  (Status: Goal on Track (progressing): YES.) Long Term Goal  BP Readings from Last 3 Encounters:  06/23/21 (!) 153/76  02/06/21 121/77  11/11/20 (!) 145/75    Lab Results  Component Value Date   CHOL 158 06/23/2021   HDL 46 06/23/2021   LDLCALC 87 06/23/2021   TRIG 143 06/23/2021   CHOLHDL 3.3 04/09/2016    Assessed understanding of CAD diagnosis. 12-05-2021: The patient has a good understanding of her CAD and heart health. Saw cardiologist in December. Will follow up March 27 with pcp. Medications reviewed including medications utilized in CAD treatment plan. 06-2022: Is compliant with medications and has questions to ask the pcp about her cholesterol medications Provided education on importance of blood pressure control in management of CAD. 12-05-2021: Education and support given.  Provided education on Importance of limiting foods high in cholesterol. 12-05-2021: The patient is compliant with heart healthy diet. Education and support given; Counseled on importance of regular laboratory monitoring as prescribed. 12-05-2021: Has regular lab work for evaluation of levels; Counseled on the importance of exercise goals with target of 150 minutes per week Reviewed Importance of taking all medications as prescribed. 12-05-2021: Medication compliance confirmed Reviewed Importance of attending all scheduled provider appointments. Saw cardiologist in December. Next  appointment with pcp on 12-22-2021 Advised to report any changes in symptoms or exercise tolerance EF 50% 05-09-2021  Depression and Anxiety  (Status: Goal on Track (progressing): YES.) Long Term Goal  Evaluation of current treatment plan related to Anxiety and Depression,  self-management and patient's adherence to plan  as established by provider. 12-05-2021: The patient is doing well for the most part. Is a little concerned over sink holes in her yard and near her home. The patient states that her cable has been out since Monday and that is when the maintenance people were messing with a sink hole near her house. She says she is going stir crazy without any cable or tv services. She states that she is also going to have to get use to the weather change again as it is going to get cold again. She states overall things are going well and she denies any acute findings at this time.  Discussed plans with patient for ongoing care management follow up and provided patient with direct contact information for care management team Advised patient to call the office for changes in mood, anxiety, and depression ; Provided education to patient re: pacing activiy, listening to her body signals when she needs to rest, discussing insomnia concerns with the pcp; Reviewed medications with patient and discussed compliance. 12-05-2021:The patient is compliant with medications; Discussed plans with patient for ongoing care management follow up and provided patient with direct contact information for care management team; Advised patient to discuss changes in anxiety and depression and insomnia  with provider; Screening for signs and symptoms of depression related to chronic disease state;  Assessed social determinant of health barriers;   Hyperlipidemia:  (Status: Goal on Track (progressing): YES.) Lab Results  Component Value Date   CHOL 158 06/23/2021   HDL 46 06/23/2021   LDLCALC 87 06/23/2021   TRIG 143 06/23/2021   CHOLHDL 3.3 04/09/2016     Medication review performed; medication list updated in electronic medical record. 12-05-2021: The patient states that she has not been taking Atorvastatin since January when she ran out. She states that she called [facility] and they did not have a refill for it. Education on lipid panel being in  normal range but to discuss her concerns with the pcp at upcoming appointment. The patient states that she was taking Atorvastatin 80 mg QD. She has had bypass surgery in September but she also is puzzled as to why that she did not have a refill of her medications. Will collaborate with the pcp concerning statin therapy.  Provider established cholesterol goals reviewed. 12-05-2021: The patient is at goal; Counseled on importance of regular laboratory monitoring as prescribed. 12-05-2021: Review of the need to have regular lab testing for effective management of HLD and heart health.  Provided HLD educational materials; Reviewed role and benefits of statin for ASCVD risk reduction; Discussed strategies to manage statin-induced myalgias; Reviewed importance of limiting foods high in cholesterol;  Hypertension: (Status: Goal on Track (progressing): YES.) Last practice recorded BP readings:  BP Readings from Last 3 Encounters:  06/23/21 (!) 153/76  02/06/21 121/77  11/11/20 (!) 145/75  Most recent eGFR/CrCl:  Lab Results  Component Value Date   EGFR 78 06/23/2021    No components found for: CRCL  Evaluation of current treatment plan related to hypertension self management and patient's adherence to plan as established by provider. 12-05-2021: The patient is doing well and denies any issues with HTN or heart health. Feels great. Was having some  leg swelling at the end of the year but not now. Does have Lasix to take prn for any swelling noted. The patient encouraged to write down questions to ask the pcp;   Provided education to patient re: stroke prevention, s/s of heart attack and stroke; Reviewed prescribed diet heart healthy. 12-05-2021: The patient is compliant with heart healthy diet Reviewed medications with patient and discussed importance of compliance. 12-05-2021: The patient takes medications as ordered;  Counseled on adverse effects of illicit drug and excessive alcohol use in patients with  high blood pressure;  Discussed plans with patient for ongoing care management follow up and provided patient with direct contact information for care management team; Advised patient, providing education and rationale, to monitor blood pressure daily and record, calling PCP for findings outside established parameters;  Advised patient to discuss blood pressure trends  with provider; Provided education on prescribed diet heart healthy;  Discussed complications of poorly controlled blood pressure such as heart disease, stroke, circulatory complications, vision complications, kidney impairment, sexual dysfunction;   Patient Goals/Self-Care Activities: Patient will self administer medications as prescribed as evidenced by self report/primary caregiver report  Patient will attend all scheduled provider appointments as evidenced by clinician review of documented attendance to scheduled appointments and patient/caregiver report Patient will call pharmacy for medication refills as evidenced by patient report and review of pharmacy fill history as appropriate Patient will attend church or other social activities as evidenced by patient report Patient will continue to perform ADL's independently as evidenced by patient/caregiver report Patient will continue to perform IADL's independently as evidenced by patient/caregiver report Patient will call provider office for new concerns or questions as evidenced by review of documented incoming telephone call notes and patient report Patient will work with BSW to address care coordination needs and will continue to work with the clinical team to address health care and disease management related needs as evidenced by documented adherence to scheduled care management/care coordination appointments - check blood pressure 3 times per week - choose a place to take my blood pressure (home, clinic or office, retail store) - write blood pressure results in a log or  diary - learn about high blood pressure - keep a blood pressure log - take blood pressure log to all doctor appointments - call doctor for signs and symptoms of high blood pressure - develop an action plan for high blood pressure - keep all doctor appointments - take medications for blood pressure exactly as prescribed - report new symptoms to your doctor - eat more whole grains, fruits and vegetables, lean meats and healthy fats - call for medicine refill 2 or 3 days before it runs out - take all medications exactly as prescribed - call doctor with any symptoms you believe are related to your medicine - call doctor when you experience any new symptoms - go to all doctor appointments as scheduled - adhere to prescribed diet: heart healthy diet        Plan:Telephone follow up appointment with care management team member scheduled for:  01-30-2022 at [place] am  Noreene Larsson RN, MSN, [facility] Mobile: [phone]

## 2021-12-05 NOTE — Addendum Note (Signed)
Addended by: Valerie Roys on: 12/05/2021 02:31 PM ? ? Modules accepted: Orders ? ?

## 2021-12-05 NOTE — Patient Instructions (Signed)
Visit Information ? ?Thank you for taking time to visit with me today. Please don't hesitate to contact me if I can be of assistance to you before our next scheduled telephone appointment. ? ?Following are the goals we discussed today:  ?Patient will self administer medications as prescribed as evidenced by self report/primary caregiver report  ?Patient will attend all scheduled provider appointments as evidenced by clinician review of documented attendance to scheduled appointments and patient/caregiver report ?Patient will call pharmacy for medication refills as evidenced by patient report and review of pharmacy fill history as appropriate ?Patient will attend church or other social activities as evidenced by patient report ?Patient will continue to perform ADL's independently as evidenced by patient/caregiver report ?Patient will continue to perform IADL's independently as evidenced by patient/caregiver report ?Patient will call provider office for new concerns or questions as evidenced by review of documented incoming telephone call notes and patient report ?Patient will work with BSW to address care coordination needs and will continue to work with the clinical team to address health care and disease management related needs as evidenced by documented adherence to scheduled care management/care coordination appointments ?- check blood pressure 3 times per week ?- choose a place to take my blood pressure (home, clinic or office, retail store) ?- write blood pressure results in a log or diary ?- learn about high blood pressure ?- keep a blood pressure log ?- take blood pressure log to all doctor appointments ?- call doctor for signs and symptoms of high blood pressure ?- develop an action plan for high blood pressure ?- keep all doctor appointments ?- take medications for blood pressure exactly as prescribed ?- report new symptoms to your doctor ?- eat more whole grains, fruits and vegetables, lean meats and  healthy fats ?- call for medicine refill 2 or 3 days before it runs out ?- take all medications exactly as prescribed ?- call doctor with any symptoms you believe are related to your medicine ?- call doctor when you experience any new symptoms ?- go to all doctor appointments as scheduled ?- adhere to prescribed diet: heart healthy diet  ? ?Our next appointment is by telephone on 01-30-2022 at 0945 am ? ?Please call the care guide team at [phone] if you need to cancel or reschedule your appointment.  ? ?If you are experiencing a Mental Health or [place] or need someone to talk to, please call the Suicide and Crisis Lifeline: 988 ?call the [place] National Suicide Prevention Lifeline: (386)529-7088 or TTY: 254-813-0088 TTY (928)393-2700) to talk to a trained counselor ?call 1-800-273-TALK (toll free, 24 hour hotline)  ? ?The patient verbalized understanding of instructions, educational materials, and care plan provided today and declined offer to receive copy of patient instructions, educational materials, and care plan.  ? ?Noreene Larsson RN, MSN, CCM ?Community Care Coordinator ?[facility] ?[place] ?Mobile: [phone]  ?

## 2021-12-12 ENCOUNTER — Other Ambulatory Visit: Payer: Self-pay | Admitting: *Deleted

## 2021-12-12 DIAGNOSIS — Z87891 Personal history of nicotine dependence: Secondary | ICD-10-CM

## 2021-12-22 ENCOUNTER — Other Ambulatory Visit: Payer: Self-pay

## 2021-12-22 ENCOUNTER — Ambulatory Visit (INDEPENDENT_AMBULATORY_CARE_PROVIDER_SITE_OTHER): Payer: Medicare Other | Admitting: Family Medicine

## 2021-12-22 ENCOUNTER — Encounter: Payer: Self-pay | Admitting: Family Medicine

## 2021-12-22 VITALS — BP 114/68 | HR 60 | Temp 98.1°F | Wt 150.2 lb

## 2021-12-22 DIAGNOSIS — I739 Peripheral vascular disease, unspecified: Secondary | ICD-10-CM

## 2021-12-22 DIAGNOSIS — I700 Atherosclerosis of aorta: Secondary | ICD-10-CM

## 2021-12-22 DIAGNOSIS — E782 Mixed hyperlipidemia: Secondary | ICD-10-CM

## 2021-12-22 DIAGNOSIS — I129 Hypertensive chronic kidney disease with stage 1 through stage 4 chronic kidney disease, or unspecified chronic kidney disease: Secondary | ICD-10-CM

## 2021-12-22 DIAGNOSIS — M79671 Pain in right foot: Secondary | ICD-10-CM

## 2021-12-22 DIAGNOSIS — D692 Other nonthrombocytopenic purpura: Secondary | ICD-10-CM

## 2021-12-22 DIAGNOSIS — F3340 Major depressive disorder, recurrent, in remission, unspecified: Secondary | ICD-10-CM

## 2021-12-22 DIAGNOSIS — I25119 Atherosclerotic heart disease of native coronary artery with unspecified angina pectoris: Secondary | ICD-10-CM

## 2021-12-22 MED ORDER — ASPIRIN 81 MG PO TBEC: 81.0000 mg | DELAYED_RELEASE_TABLET | Freq: Every day | ORAL | 3 refills | Status: DC

## 2021-12-22 MED ORDER — DICLOFENAC SODIUM 1 % EX GEL: 4.0000 g | Freq: Four times a day (QID) | CUTANEOUS | 3 refills | Status: DC

## 2021-12-22 NOTE — Assessment & Plan Note (Signed)
Stable.       - Continue to monitor

## 2021-12-22 NOTE — Assessment & Plan Note (Signed)
Under good control on current regimen. Continue current regimen. Continue to monitor. Call with any concerns. Refills through cardiology. Continue to follow with cardiology. Labs drawn today.  ? ?

## 2021-12-22 NOTE — Assessment & Plan Note (Signed)
Doing well off medicine. Continue to monitor. Call with any concerns.  

## 2021-12-22 NOTE — Assessment & Plan Note (Signed)
Under good control on current regimen. Continue current regimen. Continue to monitor. Call with any concerns. Refills through cardiology. Labs drawn today. 

## 2021-12-22 NOTE — Assessment & Plan Note (Signed)
Under good control on current regimen. Continue current regimen. Continue to monitor. Call with any concerns. Refills given. Labs drawn today.   

## 2021-12-22 NOTE — Progress Notes (Signed)
? ?BP 114/68   Pulse 60   Temp 98.1 ?F (36.7 ?C)   Wt 150 lb 3.2 oz (68.1 kg)   SpO2 98%   BMI 26.61 kg/m?   ? ?Subjective:  ? ? Patient ID: Tonya Cabrera, female    DOB: 1951-04-06, 71 y.o.   MRN: [id number] ? ?HPI: ?Tonya Cabrera is a 71 y.o. female ? ?Chief Complaint  ?Patient presents with  ? foot concern  ?  Patient states on the bottom of her left foot she has a sore, tender area. As well as on her right foot.   ? Hypertension  ? Hyperlipidemia  ? Depression  ? ?FOOT PAIN ?Duration: weeks ?Involved foot: bilateral ?Mechanism of injury: unknown ?Location: balls of feet bilaterally ?Onset: gradual  ?Severity: moderate  ?Quality:  aching, cold and sore ?Frequency: constant ?Radiation: no ?Aggravating factors: weight bearing and walking  ?Alleviating factors: ice and rest  ?Status: worse ?Treatments attempted: rest, ice, heat, and APAP  ?Relief with NSAIDs?:  No NSAIDs Taken ?Weakness with weight bearing or walking: no ?Morning stiffness: no ?Swelling: no ?Redness: no ?Bruising: no ?Paresthesias / decreased sensation: no  ?Fevers:no ? ?HYPERTENSION / HYPERLIPIDEMIA ?Satisfied with current treatment? yes ?Duration of hypertension: chronic ?BP monitoring frequency: not checking ?BP medication side effects: no ?Past BP meds: metoprolol, lasix ?Duration of hyperlipidemia: chronic ?Cholesterol medication side effects: no ?Cholesterol supplements: none ?Past cholesterol medications: atorvastatin ?Medication compliance: excellent compliance ?Aspirin: yes ?Recent stressors: no ?Recurrent headaches: no ?Visual changes: no ?Palpitations: no ?Dyspnea: no ?Chest pain: no ?Lower extremity edema: no ?Dizzy/lightheaded: no ? ?DEPRESSION ?Mood status: controlled ?Satisfied with current treatment?: not on anything ?Symptom severity: mild  ?Duration of current treatment : not on anything  ?Psychotherapy/counseling: no  ?Depressed mood: no ?Anxious mood: no ?Anhedonia: no ?Significant weight loss or gain: no ?Insomnia: no   ?Fatigue: no ?Feelings of worthlessness or guilt: no ?Impaired concentration/indecisiveness: no ?Suicidal ideations: no ?Hopelessness: no ?Crying spells: no ? ?  12/22/2021  ? 11:10 AM 06/23/2021  ?  2:25 PM 06/23/2021  ?  2:24 PM 06/17/2021  ?  6:11 PM 11/11/2020  ?  1:26 PM  ?Depression screen PHQ 2/9  ?Decreased Interest 0 0 0 0 0  ?Down, Depressed, Hopeless 0 0 0 0 0  ?PHQ - 2 Score 0 0 0 0 0  ?Altered sleeping _0 ?Tired, decreased energy 0 0   3  ?Change in appetite 0 0   3  ?Feeling bad or failure about yourself  0 0   0  ?Trouble concentrating 0 2   0  ?Moving slowly or fidgety/restless 0 0   0  ?Suicidal thoughts 0 0   0  ?PHQ-9 Score _1 ? ? ? ?Relevant past medical, surgical, family and social history reviewed and updated as indicated. Interim medical history since our last visit reviewed. ?Allergies and medications reviewed and updated. ? ?Review of Systems  ?Constitutional: Negative.   ?Respiratory: Negative.    ?Cardiovascular: Negative.   ?Gastrointestinal: Negative.   ?Musculoskeletal: Negative.   ?Skin:  Positive for color change. Negative for pallor, rash and wound.  ?Neurological: Negative.   ?Psychiatric/Behavioral: Negative.    ? ?Per HPI unless specifically indicated above ? ?   ?Objective:  ?  ?BP 114/68   Pulse 60   Temp 98.1 ?F (36.7 ?C)   Wt 150 lb 3.2 oz (68.1 kg)   SpO2 98%   BMI 26.61 kg/m?   ?  Wt Readings from Last 3 Encounters:  ?12/22/21 150 lb 3.2 oz (68.1 kg)  ?06/23/21 139 lb (63 kg)  ?02/06/21 143 lb (64.9 kg)  ?  ?Physical Exam ?Vitals and nursing note reviewed.  ?Constitutional:   ?   General: She is not in acute distress. ?   Appearance: Normal appearance. She is not ill-appearing, toxic-appearing or diaphoretic.  ?HENT:  ?   Head: Normocephalic and atraumatic.  ?   Right Ear: External ear normal.  ?   Left Ear: External ear normal.  ?   Nose: Nose normal.  ?   Mouth/Throat:  ?   Mouth: Mucous membranes are moist.  ?   Pharynx: Oropharynx is clear.  ?Eyes:  ?    General: No scleral icterus.    ?   Right eye: No discharge.     ?   Left eye: No discharge.  ?   Extraocular Movements: Extraocular movements intact.  ?   Conjunctiva/sclera: Conjunctivae normal.  ?   Pupils: Pupils are equal, round, and reactive to light.  ?Cardiovascular:  ?   Rate and Rhythm: Normal rate and regular rhythm.  ?   Pulses: Normal pulses.  ?   Heart sounds: Normal heart sounds. No murmur heard. ?  No friction rub. No gallop.  ?Pulmonary:  ?   Effort: Pulmonary effort is normal. No respiratory distress.  ?   Breath sounds: Normal breath sounds. No stridor. No wheezing, rhonchi or rales.  ?Chest:  ?   Chest wall: No tenderness.  ?Musculoskeletal:     ?   General: Normal range of motion.  ?   Cervical back: Normal range of motion and neck supple.  ?Skin: ?   General: Skin is warm and dry.  ?   Capillary Refill: Capillary refill takes less than 2 seconds.  ?   Coloration: Skin is not jaundiced or pale.  ?   Findings: No bruising, erythema, lesion or rash.  ?   Comments: Colder dusky toes for the middle 3 toes of the L foot  ?Neurological:  ?   General: No focal deficit present.  ?   Mental Status: She is alert and oriented to person, place, and time. Mental status is at baseline.  ?Psychiatric:     ?   Mood and Affect: Mood normal.     ?   Behavior: Behavior normal.     ?   Thought Content: Thought content normal.     ?   Judgment: Judgment normal.  ? ? ?Results for orders placed or performed in visit on 06/23/21  ?Microscopic Examination  ? Urine  ?Result Value Ref Range  ? WBC, UA 0-5 0 - 5 /hpf  ? RBC 0-2 0 - 2 /hpf  ? Epithelial Cells (non renal) 0-10 0 - 10 /hpf  ? Bacteria, UA None seen None seen/Few  ?CBC with Differential/Platelet  ?Result Value Ref Range  ? WBC 8.3 3.4 - 10.8 x10E3/uL  ? RBC 3.77 3.77 - 5.28 x10E6/uL  ? Hemoglobin 11.1 11.1 - 15.9 g/dL  ? Hematocrit 33.9 (L) 34.0 - 46.6 %  ? MCV 90 79 - 97 fL  ? MCH 29.4 26.6 - 33.0 pg  ? MCHC 32.7 31.5 - 35.7 g/dL  ? RDW 13.2 11.7 - 15.4 %  ?  Platelets 278 150 - 450 x10E3/uL  ? Neutrophils 69 Not Estab. %  ? Lymphs 21 Not Estab. %  ? Monocytes 7 Not Estab. %  ? Eos 3 Not Estab. %  ?  Basos 0 Not Estab. %  ? Neutrophils Absolute 5.7 1.4 - 7.0 x10E3/uL  ? Lymphocytes Absolute 1.7 0.7 - 3.1 x10E3/uL  ? Monocytes Absolute 0.6 0.1 - 0.9 x10E3/uL  ? EOS (ABSOLUTE) 0.3 0.0 - 0.4 x10E3/uL  ? Basophils Absolute 0.0 0.0 - 0.2 x10E3/uL  ? Immature Granulocytes 0 Not Estab. %  ? Immature Grans (Abs) 0.0 0.0 - 0.1 x10E3/uL  ?Comprehensive metabolic panel  ?Result Value Ref Range  ? Glucose 81 70 - 99 mg/dL  ? BUN 9 8 - 27 mg/dL  ? Creatinine, Ser 0.81 0.57 - 1.00 mg/dL  ? eGFR 78 >59 mL/min/1.73  ? BUN/Creatinine Ratio 11 (L) 12 - 28  ? Sodium 145 (H) 134 - 144 mmol/L  ? Potassium 4.0 3.5 - 5.2 mmol/L  ? Chloride 109 (H) 96 - 106 mmol/L  ? CO2 21 20 - 29 mmol/L  ? Calcium 8.9 8.7 - 10.3 mg/dL  ? Total Protein 6.3 6.0 - 8.5 g/dL  ? Albumin 4.4 3.8 - 4.8 g/dL  ? Globulin, Total 1.9 1.5 - 4.5 g/dL  ? Albumin/Globulin Ratio 2.3 (H) 1.2 - 2.2  ? Bilirubin Total 1.7 (H) 0.0 - 1.2 mg/dL  ? Alkaline Phosphatase 130 (H) 44 - 121 IU/L  ? AST 16 0 - 40 IU/L  ? ALT 11 0 - 32 IU/L  ?Lipid Panel w/o Chol/HDL Ratio  ?Result Value Ref Range  ? Cholesterol, Total 158 100 - 199 mg/dL  ? Triglycerides 143 0 - 149 mg/dL  ? HDL 46 >39 mg/dL  ? VLDL Cholesterol Cal 25 5 - 40 mg/dL  ? LDL Chol Calc (NIH) 87 0 - 99 mg/dL  ?Urinalysis, Routine w reflex microscopic  ?Result Value Ref Range  ? Specific Gravity, UA 1.020 1.005 - 1.030  ? pH, UA 5.0 5.0 - 7.5  ? Color, UA Yellow Yellow  ? Appearance Ur Clear Clear  ? Leukocytes,UA Trace (A) Negative  ? Protein,UA Negative Negative/Trace  ? Glucose, UA Negative Negative  ? Ketones, UA Negative Negative  ? RBC, UA 1+ (A) Negative  ? Bilirubin, UA Negative Negative  ? Urobilinogen, Ur 0.2 0.2 - 1.0 mg/dL  ? Nitrite, UA Negative Negative  ? Microscopic Examination See below:   ?TSH  ?Result Value Ref Range  ? TSH 2.740 0.450 - 4.500 uIU/mL   ?Microalbumin, Urine Waived  ?Result Value Ref Range  ? Microalb, Ur Waived 10 0 - 19 mg/L  ? Creatinine, Urine Waived 100 10 - 300 mg/dL  ? Microalb/Creat Ratio <30 <30 mg/g  ? ?   ?Assessment & Plan:  ? ?Problem List Items Add

## 2021-12-23 ENCOUNTER — Encounter: Payer: Self-pay | Admitting: Family Medicine

## 2021-12-23 LAB — CBC WITH DIFFERENTIAL/PLATELET
Basophils Absolute: 0 10*3/uL (ref 0.0–0.2)
Basos: 1 %
EOS (ABSOLUTE): 0.3 10*3/uL (ref 0.0–0.4)
Eos: 3 %
Hematocrit: 41.1 % (ref 34.0–46.6)
Hemoglobin: 13.9 g/dL (ref 11.1–15.9)
Immature Grans (Abs): 0 10*3/uL (ref 0.0–0.1)
Immature Granulocytes: 1 %
Lymphocytes Absolute: 1.9 10*3/uL (ref 0.7–3.1)
Lymphs: 22 %
MCH: 30.3 pg (ref 26.6–33.0)
MCHC: 33.8 g/dL (ref 31.5–35.7)
MCV: 90 fL (ref 79–97)
Monocytes Absolute: 0.5 10*3/uL (ref 0.1–0.9)
Monocytes: 6 %
Neutrophils Absolute: 5.8 10*3/uL (ref 1.4–7.0)
Neutrophils: 67 %
Platelets: 245 10*3/uL (ref 150–450)
RBC: 4.59 x10E6/uL (ref 3.77–5.28)
RDW: 13.8 % (ref 11.7–15.4)
WBC: 8.6 10*3/uL (ref 3.4–10.8)

## 2021-12-23 LAB — LIPID PANEL W/O CHOL/HDL RATIO
Cholesterol, Total: 170 mg/dL (ref 100–199)
HDL: 46 mg/dL (ref >39)
LDL Chol Calc (NIH): 89 mg/dL (ref 0–99)
Triglycerides: 204 mg/dL — ABNORMAL HIGH (ref 0–149)
VLDL Cholesterol Cal: 35 mg/dL (ref 5–40)

## 2021-12-23 LAB — COMPREHENSIVE METABOLIC PANEL
ALT: 29 IU/L (ref 0–32)
AST: 19 IU/L (ref 0–40)
Albumin/Globulin Ratio: 2.4 — ABNORMAL HIGH (ref 1.2–2.2)
Albumin: 4.7 g/dL (ref 3.8–4.8)
Alkaline Phosphatase: 121 IU/L (ref 44–121)
BUN/Creatinine Ratio: 17 (ref 12–28)
BUN: 17 mg/dL (ref 8–27)
Bilirubin Total: 1.5 mg/dL — ABNORMAL HIGH (ref 0.0–1.2)
CO2: 24 mmol/L (ref 20–29)
Calcium: 9.4 mg/dL (ref 8.7–10.3)
Chloride: 107 mmol/L — ABNORMAL HIGH (ref 96–106)
Creatinine, Ser: 1.02 mg/dL — ABNORMAL HIGH (ref 0.57–1.00)
Globulin, Total: 2 g/dL (ref 1.5–4.5)
Glucose: 84 mg/dL (ref 70–99)
Potassium: 4.5 mmol/L (ref 3.5–5.2)
Sodium: 146 mmol/L — ABNORMAL HIGH (ref 134–144)
Total Protein: 6.7 g/dL (ref 6.0–8.5)
eGFR: 59 mL/min/{1.73_m2} — ABNORMAL LOW (ref >59)

## 2021-12-24 ENCOUNTER — Ambulatory Visit
Admission: RE | Admit: 2021-12-24 | Discharge: 2021-12-24 | Disposition: A | Discharge status: Home / Self Care | Payer: Medicare Other | Source: Ambulatory Visit | Attending: Family Medicine | Admitting: Family Medicine

## 2021-12-24 DIAGNOSIS — Z87891 Personal history of nicotine dependence: Secondary | ICD-10-CM | POA: Diagnosis not present

## 2021-12-26 ENCOUNTER — Other Ambulatory Visit: Payer: Self-pay | Admitting: Acute Care

## 2021-12-26 DIAGNOSIS — F32A Depression, unspecified: Secondary | ICD-10-CM | POA: Diagnosis not present

## 2021-12-26 DIAGNOSIS — E785 Hyperlipidemia, unspecified: Secondary | ICD-10-CM | POA: Diagnosis not present

## 2021-12-26 DIAGNOSIS — I2510 Atherosclerotic heart disease of native coronary artery without angina pectoris: Secondary | ICD-10-CM | POA: Diagnosis not present

## 2021-12-26 DIAGNOSIS — Z87891 Personal history of nicotine dependence: Secondary | ICD-10-CM

## 2021-12-26 DIAGNOSIS — I10 Essential (primary) hypertension: Secondary | ICD-10-CM

## 2021-12-30 ENCOUNTER — Encounter: Payer: Self-pay | Admitting: Podiatry

## 2021-12-30 ENCOUNTER — Ambulatory Visit (INDEPENDENT_AMBULATORY_CARE_PROVIDER_SITE_OTHER): Payer: Medicare Other | Admitting: Podiatry

## 2021-12-30 DIAGNOSIS — L0889 Other specified local infections of the skin and subcutaneous tissue: Secondary | ICD-10-CM

## 2021-12-30 DIAGNOSIS — L989 Disorder of the skin and subcutaneous tissue, unspecified: Secondary | ICD-10-CM

## 2021-12-30 NOTE — Progress Notes (Signed)
? ?  Subjective: ?71 y.o. female presenting to the office today as a new patient for evaluation of symptomatic callus lesions to the left foot.  Patient states that most recently she has been applying diclofenac topical gel which has helped significantly.  She does admit to walking around barefoot especially around the house on hardwood floors. ? ? ?Past Medical History:  ?Diagnosis Date  ? Acute gastric ulcer without hemorrhage or perforation   ? Acute osteomyelitis of hand ([place]) 03/01/2015  ? Acute posthemorrhagic anemia   ? AKI (acute kidney injury) ([place]) 02/28/2015  ? Arthritis   ? Benign neoplasm of cecum   ? Coronary artery disease   ? coronary stent  ? Dyspnea   ? Gastritis, Helicobacter pylori   ? Hard of hearing   ? Heart murmur   ? Hypercholesteremia   ? Hypertension   ? Myocardial infarction [facility])   ? 2015  ? Osteoporosis   ? Peptic ulcer   ? Special screening for malignant neoplasms, colon   ? Stomach irritation   ? Stroke [facility])   ? 10-12 years ago  ? ? ? ?Objective:  ?Physical Exam ?General: Alert and oriented x3 in no acute distress ? ?Dermatology: Hyperkeratotic lesion(s) present on the left plantar forefoot. Pain on palpation with a central nucleated core noted. Skin is warm, dry and supple bilateral lower extremities. Negative for open lesions or macerations. ? ?Vascular: Palpable pedal pulses bilaterally. No edema or erythema noted. Capillary refill within normal limits. ? ?Neurological: Epicritic and protective threshold grossly intact bilaterally.  ? ?Musculoskeletal Exam: Pain on palpation at the keratotic lesion(s) noted. Range of motion within normal limits bilateral. Muscle strength 5/5 in all groups bilateral. ? ?Assessment: ?1.  Symptomatic calluses left plantar forefoot ? ? ?Plan of Care:  ?1. Patient evaluated ?2.  Light excisional debridement of keratoic lesion(s) using a chisel blade was performed without incident.  ?3. Dressed area with light dressing. ?4.  Continue topical diclofenac gel  since this helps to alleviate her pain  ?5.  Advised against going barefoot  ?6.  Patient is to return to the clinic PRN.  ? ?Edrick Kins, DPM ?[place] ? ?Dr. Edrick Kins, DPM  ?  ?[address]                                      ?[place], [place] [address]                ?Office [phone]  ?Fax [phone] ? ? ? ? ?

## 2022-01-30 ENCOUNTER — Telehealth: Payer: Medicare Other

## 2022-01-30 ENCOUNTER — Ambulatory Visit (INDEPENDENT_AMBULATORY_CARE_PROVIDER_SITE_OTHER): Payer: Medicare Other

## 2022-01-30 DIAGNOSIS — I25119 Atherosclerotic heart disease of native coronary artery with unspecified angina pectoris: Secondary | ICD-10-CM

## 2022-01-30 DIAGNOSIS — I10 Essential (primary) hypertension: Secondary | ICD-10-CM

## 2022-01-30 DIAGNOSIS — F419 Anxiety disorder, unspecified: Secondary | ICD-10-CM

## 2022-01-30 DIAGNOSIS — E782 Mixed hyperlipidemia: Secondary | ICD-10-CM

## 2022-01-30 DIAGNOSIS — F3340 Major depressive disorder, recurrent, in remission, unspecified: Secondary | ICD-10-CM

## 2022-01-30 DIAGNOSIS — F339 Major depressive disorder, recurrent, unspecified: Secondary | ICD-10-CM

## 2022-01-30 NOTE — Patient Instructions (Signed)
Visit Information ? ?Thank you for taking time to visit with me today. Please don't hesitate to contact me if I can be of assistance to you before our next scheduled telephone appointment. ? ?Following are the goals we discussed today:  ?RNCM Clinical Goal(s):  ?Patient will verbalize understanding of plan for management of CAD, HTN, HLD, Anxiety, and Depression as evidenced by following recommendations of the provider, taking medications as directed, compliance with heart healthy diet and working with the CCM team to optimize health and well being ?take all medications exactly as prescribed and will call provider for medication related questions as evidenced by compliance with medications and calling for refills before running out of medications    ?attend all scheduled medical appointments: 06-25-2022 at 1 pm, as evidenced by keeping appointments and calling for changes and needs warranting a sooner appointment        ?demonstrate improved and ongoing adherence to prescribed treatment plan for CAD, HTN, HLD, Anxiety, and Depression as evidenced by stable conditions without exacerbations ?demonstrate a decrease in CAD, HTN, HLD, Anxiety, and Depression exacerbations  as evidenced by following the plan of care, calling the office for changes, and reaching out to the CCM team for changes or needs ?demonstrate ongoing self health care management ability for effective management of chronic conditions as evidenced by  working with the CCM team through collaboration with Consulting civil engineer, provider, and care team.  ?  ?Interventions: ?1:1 collaboration with primary care provider regarding development and update of comprehensive plan of care as evidenced by provider attestation and co-signature ?Inter-disciplinary care team collaboration (see longitudinal plan of care) ?Evaluation of current treatment plan related to  self management and patient's adherence to plan as established by provider ?  ?  ?SDOH Barriers (Status:  Goal on Track (progressing): YES.) Long Term Goal  ?Patient interviewed and SDOH assessment performed ?       ?Patient interviewed and appropriate assessments performed ?Provided patient with information about SDOH  and resources in [facility] and care guides to assist with new needs or concerns ?Discussed plans with patient for ongoing care management follow up and provided patient with direct contact information for care management team ?Advised patient to call the office for changes in SDOH, new concerns, or questions ?08-05-2021: Call made back to the patient and reviewed the number to call to schedule her DEXA scan for bone density test. The patient verbalized understanding and will call and schedule an appointment.  ?  ?  ?  ?CAD  (Status: Goal on Track (progressing): YES.) Long Term Goal  ?   ?BP Readings from Last 3 Encounters:  ?12/22/21 114/68  ?06/23/21 (!) 153/76  ?02/06/21 121/77  ?  ?     ?Lab Results  ?Component Value Date  ?  CHOL 170 12/22/2021  ?  HDL 46 12/22/2021  ?  [place] 89 12/22/2021  ?  TRIG 204 (H) 12/22/2021  ?  CHOLHDL 3.3 04/09/2016  ?  ?Assessed understanding of CAD diagnosis. 12-05-2021: The patient has a good understanding of her CAD and heart health. Saw cardiologist in December. Will follow up March 27 with pcp. 01-30-2022: The patient has stable CAD and heart health. Saw pcp in March. Knows to call for changes in condition or new questions or concerns related to CAD or heart health.  ?Medications reviewed including medications utilized in CAD treatment plan. 06-2022: Is compliant with medications and has questions to ask the pcp about her cholesterol medications. 01-30-2022: Is compliant with  Lipitor 80 mg QD. ?Provided education on importance of blood pressure control in management of CAD. 01-30-2022: Education and support given. The patient states she is stable and denies any new concerns at this time. Blood pressures are stable.  ?Provided education on Importance of limiting foods  high in cholesterol. 01-30-2022: The patient is compliant with heart healthy diet. Education and support given; ?Counseled on importance of regular laboratory monitoring as prescribed. 01-30-2022: Has regular lab work for evaluation of levels; ?Counseled on the importance of exercise goals with target of 150 minutes per week ?Reviewed Importance of taking all medications as prescribed. 01-30-2022: Medication compliance confirmed. Takes Lipitor 80 mg QD ?Reviewed Importance of attending all scheduled provider appointments. Saw cardiologist in December. Next appointment with pcp on 12-22-2021. Next appointment with pcp on 06-25-2022 at 1 pm ?Advised to report any changes in symptoms or exercise tolerance ?EF 50% 05-09-2021 ?  ?Depression and Anxiety  (Status: Goal on Track (progressing): YES.) Long Term Goal  ?Evaluation of current treatment plan related to Anxiety and Depression,  self-management and patient's adherence to plan as established by provider. 12-05-2021: The patient is doing well for the most part. Is a little concerned over sink holes in her yard and near her home. The patient states that her cable has been out since Monday and that is when the maintenance people were messing with a sink hole near her house. She says she is going stir crazy without any cable or tv services. She states that she is also going to have to get use to the weather change again as it is going to get cold again. She states overall things are going well and she denies any acute findings at this time. 01-30-2022: The patient is doing well and denies any new concerns with anxiety and depression. She is still doing her catering business and is happy that her health has stabilized. She states that she is pacing her activities and knows her limitations. Knows to call for changes.  ?Discussed plans with patient for ongoing care management follow up and provided patient with direct contact information for care management team ?Advised patient to  call the office for changes in mood, anxiety, and depression ; ?Provided education to patient re: pacing activiy, listening to her body signals when she needs to rest, discussing insomnia concerns with the pcp; ?Reviewed medications with patient and discussed compliance. 01-30-2022:The patient is compliant with medications; ?Discussed plans with patient for ongoing care management follow up and provided patient with direct contact information for care management team; ?Advised patient to discuss changes in anxiety and depression and insomnia  with provider; ?Screening for signs and symptoms of depression related to chronic disease state;  ?Assessed social determinant of health barriers;  ?  ?Hyperlipidemia:  (Status: Goal on Track (progressing): YES.) ?     ?Lab Results  ?Component Value Date  ?  CHOL 170 12/22/2021  ?  HDL 46 12/22/2021  ?  [place] 89 12/22/2021  ?  TRIG 204 (H) 12/22/2021  ?  CHOLHDL 3.3 04/09/2016  ?  ?  ?Medication review performed; medication list updated in electronic medical record. 12-05-2021: The patient states that she has not been taking Atorvastatin since January when she ran out. She states that she called [facility] and they did not have a refill for it. Education on lipid panel being in normal range but to discuss her concerns with the pcp at upcoming appointment. The patient states that she was taking Atorvastatin 80 mg QD. She  has had bypass surgery in September but she also is puzzled as to why that she did not have a refill of her medications. Will collaborate with the pcp concerning statin therapy. 01-30-2022: The patient is taking her Atorvastatin 80 mg QD. Her triglycerides were elevated recently. Is back on medications and doing well.  ?Provider established cholesterol goals reviewed. 01-30-2022: The patient is at goal; ?Counseled on importance of regular laboratory monitoring as prescribed. 12-05-2021: Review of the need to have regular lab testing for effective management of HLD and heart  health. 01-30-2022: Had labs done on 12-22-2021. Labs are up to date.  ?Provided HLD educational materials; ?Reviewed role and benefits of statin for ASCVD risk reduction; ?Discussed strategies to manage st

## 2022-01-30 NOTE — Chronic Care Management (AMB) (Signed)
?Chronic Care Management  ? ?CCM RN Visit Note ? ?01/30/2022 ?Name: Tonya Cabrera MRN: [id number] DOB: 06-09-1951 ? ?Subjective: ?Tonya Cabrera is a 71 y.o. year old female who is a primary care patient of Tonya Roys, DO. The care management team was consulted for assistance with disease management and care coordination needs.   ? ?Engaged with patient by telephone for follow up visit in response to provider referral for case management and/or care coordination services.  ? ?Consent to Services:  ?The patient was given information about Chronic Care Management services, agreed to services, and gave verbal consent prior to initiation of services.  Please see initial visit note for detailed documentation.  ? ?Patient agreed to services and verbal consent obtained.  ? ?Assessment: Review of patient past medical history, allergies, medications, health status, including review of consultants reports, laboratory and other test data, was performed as part of comprehensive evaluation and provision of chronic care management services.  ? ?SDOH (Social Determinants of Health) assessments and interventions performed:   ? ?CCM Care Plan ? ?No Known Allergies ? ?Outpatient Encounter Medications as of 01/30/2022  ?Medication Sig  ? alendronate (FOSAMAX) 70 MG tablet Take 1 tablet (70 mg total) by mouth every Sunday. Take with a full glass of water on an empty stomach.  ? aspirin 81 MG EC tablet Take 1 tablet (81 mg total) by mouth daily. TAKE 1 TABLET (81 MG TOTAL) BY MOUTH DAILY.  ? atorvastatin (LIPITOR) 80 MG tablet Take 1 tablet (80 mg total) by mouth daily.  ? diclofenac Sodium (VOLTAREN) 1 % GEL Apply 4 g topically 4 (four) times daily.  ? furosemide (LASIX) 20 MG tablet Take 20 mg by mouth daily.  ? metoprolol tartrate (LOPRESSOR) 25 MG tablet SMARTSIG:1 Tablet(s) By Mouth Every 12 Hours  ? nitroGLYCERIN (NITROSTAT) 0.4 MG SL tablet Place 0.4 mg under the tongue every 5 (five) minutes as needed for chest pain.  ?  potassium chloride SA (KLOR-CON M) 20 MEQ tablet Take 1 tablet by mouth 2 (two) times daily.  ? ?No facility-administered encounter medications on file as of 01/30/2022.  ? ? ?Patient Active Problem List  ? Diagnosis Date Noted  ? Senile purpura ([place]) 06/23/2021  ? Overactive bladder 06/19/2019  ? Urge incontinence of urine 06/19/2019  ? Advance directive discussed with patient 05/02/2019  ? Diverticulosis of large intestine without diverticulitis   ? Depression, major, recurrent, in remission ([place]) 03/04/2018  ? Symptomatic anemia 02/19/2018  ? Personal history of tobacco use, presenting hazards to health 07/11/2016  ? Aortic atherosclerosis ([place]) 07/09/2016  ? Osteoporosis 07/07/2016  ? Myopia of both eyes 06/16/2016  ? Hyperlipidemia 06/08/2016  ? CAD (coronary artery disease) 06/08/2016  ? Snoring 06/08/2016  ? Benign hypertensive renal disease 11/14/2015  ? ? ?Conditions to be addressed/monitored:CAD, HTN, HLD, Anxiety, and Depression ? ?Care Plan : RNCM: General Plan of Care (Adult) for chronic Disease Management and Care Coordination Needs  ?Updates made by Vanita Ingles, RN since 01/30/2022 12:00 AM  ?  ? ?Problem: RNCM: Development of Plan of Care for Chronic Disease Management (CAD, HTN, HLD, Depression, Anxiety)   ?Priority: High  ?  ? ?Long-Range Goal: RNCM: Effective Management  of Plan of Care for Chronic Disease Management (CAD, HTN, HLD, Depression, Anxiety)   ?Start Date: 08/05/2021  ?Expected End Date: 08/05/2022  ?Priority: High  ?Note:   ?Current Barriers:  ?Knowledge Deficits related to plan of care for management of CAD, HTN, HLD, and Anxiety with  Excessive Worry, ?Social Anxiety, and Depression: depressed mood ?anxiety  ?Chronic Disease Management support and education needs related to CAD, HTN, HLD, and Anxiety with Excessive Worry, and Depression: depressed mood ?anxiety ? ?RNCM Clinical Goal(s):  ?Patient will verbalize understanding of plan for management of CAD, HTN, HLD, Anxiety, and  Depression as evidenced by following recommendations of the provider, taking medications as directed, compliance with heart healthy diet and working with the CCM team to optimize health and well being ?take all medications exactly as prescribed and will call provider for medication related questions as evidenced by compliance with medications and calling for refills before running out of medications    ?attend all scheduled medical appointments: 06-25-2022 at 1 pm, as evidenced by keeping appointments and calling for changes and needs warranting a sooner appointment        ?demonstrate improved and ongoing adherence to prescribed treatment plan for CAD, HTN, HLD, Anxiety, and Depression as evidenced by stable conditions without exacerbations ?demonstrate a decrease in CAD, HTN, HLD, Anxiety, and Depression exacerbations  as evidenced by following the plan of care, calling the office for changes, and reaching out to the CCM team for changes or needs ?demonstrate ongoing self health care management ability for effective management of chronic conditions as evidenced by  working with the CCM team through collaboration with Consulting civil engineer, provider, and care team.  ? ?Interventions: ?1:1 collaboration with primary care provider regarding development and update of comprehensive plan of care as evidenced by provider attestation and co-signature ?Inter-disciplinary care team collaboration (see longitudinal plan of care) ?Evaluation of current treatment plan related to  self management and patient's adherence to plan as established by provider ? ? ?SDOH Barriers (Status: Goal on Track (progressing): YES.) Long Term Goal  ?Patient interviewed and SDOH assessment performed ?       ?Patient interviewed and appropriate assessments performed ?Provided patient with information about SDOH  and resources in [facility] and care guides to assist with new needs or concerns ?Discussed plans with patient for ongoing care management  follow up and provided patient with direct contact information for care management team ?Advised patient to call the office for changes in SDOH, new concerns, or questions ?08-05-2021: Call made back to the patient and reviewed the number to call to schedule her DEXA scan for bone density test. The patient verbalized understanding and will call and schedule an appointment.  ? ? ? ?CAD  (Status: Goal on Track (progressing): YES.) Long Term Goal  ?BP Readings from Last 3 Encounters:  ?12/22/21 114/68  ?06/23/21 (!) 153/76  ?02/06/21 121/77  ?  ?Lab Results  ?Component Value Date  ? CHOL 170 12/22/2021  ? HDL 46 12/22/2021  ? [place] 89 12/22/2021  ? TRIG 204 (H) 12/22/2021  ? CHOLHDL 3.3 04/09/2016  ?  ?Assessed understanding of CAD diagnosis. 12-05-2021: The patient has a good understanding of her CAD and heart health. Saw cardiologist in December. Will follow up March 27 with pcp. 01-30-2022: The patient has stable CAD and heart health. Saw pcp in March. Knows to call for changes in condition or new questions or concerns related to CAD or heart health.  ?Medications reviewed including medications utilized in CAD treatment plan. 06-2022: Is compliant with medications and has questions to ask the pcp about her cholesterol medications. 01-30-2022: Is compliant with Lipitor 80 mg QD. ?Provided education on importance of blood pressure control in management of CAD. 01-30-2022: Education and support given. The patient states she is stable  and denies any new concerns at this time. Blood pressures are stable.  ?Provided education on Importance of limiting foods high in cholesterol. 01-30-2022: The patient is compliant with heart healthy diet. Education and support given; ?Counseled on importance of regular laboratory monitoring as prescribed. 01-30-2022: Has regular lab work for evaluation of levels; ?Counseled on the importance of exercise goals with target of 150 minutes per week ?Reviewed Importance of taking all medications as  prescribed. 01-30-2022: Medication compliance confirmed. Takes Lipitor 80 mg QD ?Reviewed Importance of attending all scheduled provider appointments. Saw cardiologist in December. Next appointment with pcp on 3-27-202

## 2022-02-25 DIAGNOSIS — E785 Hyperlipidemia, unspecified: Secondary | ICD-10-CM | POA: Diagnosis not present

## 2022-02-25 DIAGNOSIS — I2510 Atherosclerotic heart disease of native coronary artery without angina pectoris: Secondary | ICD-10-CM | POA: Diagnosis not present

## 2022-02-25 DIAGNOSIS — F32A Depression, unspecified: Secondary | ICD-10-CM

## 2022-02-25 DIAGNOSIS — Z87891 Personal history of nicotine dependence: Secondary | ICD-10-CM | POA: Diagnosis not present

## 2022-02-25 DIAGNOSIS — I10 Essential (primary) hypertension: Secondary | ICD-10-CM | POA: Diagnosis not present

## 2022-04-10 ENCOUNTER — Telehealth: Payer: Self-pay

## 2022-04-10 ENCOUNTER — Telehealth: Payer: Medicare Other

## 2022-04-10 NOTE — Telephone Encounter (Signed)
  Care Management   Follow Up Note   04/10/2022 Name: Tonya Cabrera MRN: [id number] DOB: 07/04/1951   Referred by: Valerie Roys, DO Reason for referral : Chronic Care Management (RNCM: Follow up/Case Closure for Chronic Disease Management and Care Coordination Needs )   An unsuccessful telephone outreach was attempted today. The patient was referred to the case management team for assistance with care management and care coordination.   Follow Up Plan: A HIPPA compliant phone message was left for the patient providing contact information and requesting a return call.   Noreene Larsson RN, MSN, [facility] Mobile: [phone]

## 2022-05-02 ENCOUNTER — Other Ambulatory Visit: Payer: Self-pay | Admitting: Family Medicine

## 2022-05-04 NOTE — Telephone Encounter (Signed)
Requested Prescriptions  Pending Prescriptions Disp Refills  . alendronate (FOSAMAX) 70 MG tablet [Pharmacy Med Name: ALENDRONATE SODIUM 70 MG TAB] 12 tablet 3    Sig: TAKE 1 TABLET BY MOUTH EVERY SUNDAY. TAKE WITH A FULL GLASS OF WATER ON AN EMPTY STOMACH.     Endocrinology:  Bisphosphonates Failed - 05/02/2022  1:18 AM      Failed - Vitamin D in normal range and within 360 days    Vit D, 25-Hydroxy  Date Value Ref Range Status  11/03/2019 19.2 (L) 30.0 - 100.0 ng/mL Final    Comment:    Vitamin D deficiency has been defined by the [place] practice guideline as a level of serum 25-OH vitamin D less than 20 ng/mL (1,2). The Endocrine Society went on to further define vitamin D insufficiency as a level between 21 and 29 ng/mL (2). 1. IOM (Institute of Medicine). 2010. Dietary reference    intakes for calcium and D. [place]: The    [employer]. 2. Holick MF, [place], Bischoff-Ferrari HA, et al.    Evaluation, treatment, and prevention of vitamin D    deficiency: an Endocrine Society clinical practice    guideline. JCEM. 2011 Jul; 96(7):1911-30.          Failed - Cr in normal range and within 360 days    Creatinine  Date Value Ref Range Status  05/16/2014 0.90 0.60 - 1.30 mg/dL Final   Creatinine, Ser  Date Value Ref Range Status  12/22/2021 1.02 (H) 0.57 - 1.00 mg/dL Final         Failed - Mg Level in normal range and within 360 days    No results found for: "MG"       Failed - Phosphate in normal range and within 360 days    No results found for: "PHOS"       Failed - Bone Mineral Density or Dexa Scan completed in the last 2 years      Passed - Ca in normal range and within 360 days    Calcium  Date Value Ref Range Status  12/22/2021 9.4 8.7 - 10.3 mg/dL Final   Calcium, Total  Date Value Ref Range Status  05/16/2014 8.2 (L) 8.5 - 10.1 mg/dL Final         Passed - eGFR is 30 or above and within 360 days     EGFR (African American)  Date Value Ref Range Status  05/16/2014 >60  Final   GFR calc Af Amer  Date Value Ref Range Status  11/11/2020 93 >59 mL/min/1.73 Final    Comment:    **In accordance with recommendations from the NKF-ASN Task force,**   [facility] is in the process of updating its eGFR calculation to the   2021 CKD-EPI creatinine equation that estimates kidney function   without a race variable.    EGFR (Non-African Amer.)  Date Value Ref Range Status  05/16/2014 >60  Final    Comment:    eGFR values <74m/min/1.73 m2 may be an indication of chronic kidney disease (CKD). Calculated eGFR is useful in patients with stable renal function. The eGFR calculation will not be reliable in acutely ill patients when serum creatinine is changing rapidly. It is not useful in  patients on dialysis. The eGFR calculation may not be applicable to patients at the low and high extremes of body sizes, pregnant women, and vegetarians.    GFR calc non Af AWyvonnia Lora Date  Value Ref Range Status  11/11/2020 80 >59 mL/min/1.73 Final   eGFR  Date Value Ref Range Status  12/22/2021 59 (L) >59 mL/min/1.73 Final         Passed - Valid encounter within last 12 months    Recent Outpatient Visits          4 months ago Benign hypertensive renal disease   [facility] [place], Megan P, DO   10 months ago Routine general medical examination at a health care facility   [facility], [place], DO   1 year ago Chest pain, unspecified type   [place], Barb Merino, DO   1 year ago Encounter for [employer] annual wellness exam   [place], [place], DO   2 years ago [place], Barb Merino, DO      Future Appointments            In 1 month Johnson, Barb Merino, DO [facility] [facility], [facility]

## 2022-05-15 ENCOUNTER — Other Ambulatory Visit (INDEPENDENT_AMBULATORY_CARE_PROVIDER_SITE_OTHER): Payer: Self-pay | Admitting: Nurse Practitioner

## 2022-05-15 DIAGNOSIS — I739 Peripheral vascular disease, unspecified: Secondary | ICD-10-CM

## 2022-05-17 DIAGNOSIS — I70219 Atherosclerosis of native arteries of extremities with intermittent claudication, unspecified extremity: Secondary | ICD-10-CM | POA: Insufficient documentation

## 2022-05-17 NOTE — Progress Notes (Signed)
MRN : [id number]  Tonya Cabrera is a 71 y.o. ([birth year]) female who presents with chief complaint of check circulation.  History of Present Illness:   The patient is seen for evaluation of painful lower extremities. Patient notes the pain is variable and not always associated with activity.  The pain is somewhat consistent day to day occurring on most days. The patient notes the pain also occurs with standing and routinely seems worse as the day wears on. The pain has been progressive over the past several years. The patient states these symptoms are causing  a negative impact on quality of life and daily activities which was a factor in the referral.  The patient has a  history of back problems and DJD of the lumbar and sacral spine.   The patient denies rest pain or dangling of an extremity off the side of the bed during the night for relief. No open wounds or sores at this time. No history of DVT or phlebitis. No prior vascular interventions or surgeries.  ABI's Rt=1.04 and LT=1.02 (triphasic bilaterally)    No outpatient medications have been marked as taking for the 05/18/22 encounter (Appointment) with Delana Meyer, Dolores Lory, MD.    Past Medical History:  Diagnosis Date   Acute gastric ulcer without hemorrhage or perforation    Acute osteomyelitis of hand ([place]) 03/01/2015   Acute posthemorrhagic anemia    AKI (acute kidney injury) ([place]) 02/28/2015   Arthritis    Benign neoplasm of cecum    Coronary artery disease    coronary stent   Dyspnea    Gastritis, Helicobacter pylori    Hard of hearing    Heart murmur    Hypercholesteremia    Hypertension    Myocardial infarction ([place])    2015   Osteoporosis    Peptic ulcer    Special screening for malignant neoplasms, colon    Stomach irritation    Stroke ([place])    10-12 years ago    Past Surgical History:  Procedure Laterality Date   CARDIAC SURGERY  05/09/2021   CAROTID STENT     CATARACT EXTRACTION  W/PHACO Right 11/09/2017   Procedure: CATARACT EXTRACTION PHACO AND INTRAOCULAR LENS PLACEMENT ([place]) RIGHT;  Surgeon: Eulogio Bear, MD;  Location: [facility];  Service: Ophthalmology;  Laterality: Right;   CATARACT EXTRACTION W/PHACO Left 11/23/2017   Procedure: CATARACT EXTRACTION PHACO AND INTRAOCULAR LENS PLACEMENT ([place]) LEFT;  Surgeon: Eulogio Bear, MD;  Location: [facility];  Service: Ophthalmology;  Laterality: Left;   COLONOSCOPY WITH PROPOFOL N/A 10/06/2018   Procedure: COLONOSCOPY WITH PROPOFOL;  Surgeon: Virgel Manifold, MD;  Location: [facility];  Service: Endoscopy;  Laterality: N/A;   ESOPHAGOGASTRODUODENOSCOPY (EGD) WITH PROPOFOL N/A 02/22/2018   Procedure: ESOPHAGOGASTRODUODENOSCOPY (EGD) WITH PROPOFOL;  Surgeon: Virgel Manifold, MD;  Location: [facility];  Service: Endoscopy;  Laterality: N/A;   ESOPHAGOGASTRODUODENOSCOPY (EGD) WITH PROPOFOL N/A 06/08/2018   Procedure: ESOPHAGOGASTRODUODENOSCOPY (EGD) WITH PROPOFOL;  Surgeon: Virgel Manifold, MD;  Location: [facility];  Service: Endoscopy;  Laterality: N/A;   ESOPHAGOGASTRODUODENOSCOPY (EGD) WITH PROPOFOL N/A 10/06/2018   Procedure: ESOPHAGOGASTRODUODENOSCOPY (EGD) WITH PROPOFOL;  Surgeon: Virgel Manifold, MD;  Location: [facility];  Service: Endoscopy;  Laterality: N/A;   INCONTINENCE SURGERY     LEFT HEART CATH AND CORONARY ANGIOGRAPHY N/A 02/06/2021   Procedure: LEFT HEART CATH AND CORONARY ANGIOGRAPHY;  Surgeon: Yolonda Kida,  MD;  Location: [facility];  Service: Cardiovascular;  Laterality: N/A;   THYROID SURGERY      Social History Social History   Tobacco Use   Smoking status: Former    Packs/day: 1.50    Years: 22.00    Total pack years: 33.00    Types: Cigarettes    Quit date: 05/08/2014    Years since quitting: 8.0   Smokeless tobacco: Never   Tobacco comments:    Smoked over 25 years, quit after Heart Attack in 2015.    Vaping  Use   Vaping Use: Never used  Substance Use Topics   Alcohol use: No    Alcohol/week: 0.0 standard drinks of alcohol   Drug use: No    Family History Family History  Problem Relation Age of Onset   Liver cancer Brother    Heart Problems Mother    Breast cancer Neg Hx     No Known Allergies   REVIEW OF SYSTEMS (Negative unless checked)  Constitutional: '[]'$ Weight loss  '[]'$ Fever  '[]'$ Chills Cardiac: '[]'$ Chest pain   '[]'$ Chest pressure   '[]'$ Palpitations   '[]'$ Shortness of breath when laying flat   '[]'$ Shortness of breath with exertion. Vascular:  '[x]'$ Pain in legs with walking   '[]'$ Pain in legs at rest  '[]'$ History of DVT   '[]'$ Phlebitis   '[]'$ Swelling in legs   '[]'$ Varicose veins   '[]'$ Non-healing ulcers Pulmonary:   '[]'$ Uses home oxygen   '[]'$ Productive cough   '[]'$ Hemoptysis   '[]'$ Wheeze  '[]'$ COPD   '[]'$ Asthma Neurologic:  '[]'$ Dizziness   '[]'$ Seizures   '[]'$ History of stroke   '[]'$ History of TIA  '[]'$ Aphasia   '[]'$ Vissual changes   '[]'$ Weakness or numbness in arm   '[]'$ Weakness or numbness in leg Musculoskeletal:   '[]'$ Joint swelling   '[]'$ Joint pain   '[]'$ Low back pain Hematologic:  '[]'$ Easy bruising  '[]'$ Easy bleeding   '[]'$ Hypercoagulable state   '[]'$ Anemic Gastrointestinal:  '[]'$ Diarrhea   '[]'$ Vomiting  '[]'$ Gastroesophageal reflux/heartburn   '[]'$ Difficulty swallowing. Genitourinary:  '[]'$ Chronic kidney disease   '[]'$ Difficult urination  '[]'$ Frequent urination   '[]'$ Blood in urine Skin:  '[]'$ Rashes   '[]'$ Ulcers  Psychological:  '[]'$ History of anxiety   '[]'$  History of major depression.  Physical Examination  There were no vitals filed for this visit. There is no height or weight on file to calculate BMI. Gen: WD/WN, NAD Head: [place]/AT, No temporalis wasting.  Ear/Nose/Throat: Hearing grossly intact, nares w/o erythema or drainage Eyes: PER, EOMI, sclera nonicteric.  Neck: Supple, no masses.  No bruit or JVD.  Pulmonary:  Good air movement, no audible wheezing, no use of accessory muscles.  Cardiac: RRR, normal S1, S2, no Murmurs. Vascular:  mild trophic changes,  no open wounds Vessel Right Left  Radial Palpable Palpable  PT Palpable Palpable  DP Palpable Palpable  Gastrointestinal: soft, non-distended. No guarding/no peritoneal signs.  Musculoskeletal: M/S 5/5 throughout.  No visible deformity.  Neurologic: CN 2-12 intact. Pain and light touch intact in extremities.  Symmetrical.  Speech is fluent. Motor exam as listed above. Psychiatric: Judgment intact, Mood & affect appropriate for pt's clinical situation. Dermatologic: No rashes or ulcers noted.  No changes consistent with cellulitis.   CBC Lab Results  Component Value Date   WBC 8.6 12/22/2021   HGB 13.9 12/22/2021   HCT 41.1 12/22/2021   MCV 90 12/22/2021   PLT 245 12/22/2021    BMET    Component Value Date/Time   NA 146 (H) 12/22/2021 1118   NA 144 05/16/2014 0020   K 4.5  12/22/2021 1118   K 3.9 05/16/2014 0020   CL 107 (H) 12/22/2021 1118   CL 109 (H) 05/16/2014 0020   CO2 24 12/22/2021 1118   CO2 23 05/16/2014 0020   GLUCOSE 84 12/22/2021 1118   GLUCOSE 91 02/20/2018 0531   GLUCOSE 121 (H) 05/16/2014 0020   BUN 17 12/22/2021 1118   BUN 9 05/16/2014 0020   CREATININE 1.02 (H) 12/22/2021 1118   CREATININE 0.90 05/16/2014 0020   CALCIUM 9.4 12/22/2021 1118   CALCIUM 8.2 (L) 05/16/2014 0020   GFRNONAA 80 11/11/2020 1332   GFRNONAA >60 05/16/2014 0020   GFRAA 93 11/11/2020 1332   GFRAA >60 05/16/2014 0020   CrCl cannot be calculated (Patient's most recent lab result is older than the maximum 21 days allowed.).  COAG Lab Results  Component Value Date   INR 1.07 02/19/2018   INR 1.0 05/15/2014    Radiology No results found.   Assessment/Plan 1. Atherosclerosis of native artery of both lower extremities with intermittent claudication (HCC) Recommend:  The patient has atypical pain symptoms for vascular disease and on exam I do not find evidence of vascular pathology that would explain the patient's symptoms.  Noninvasive studies do not identify significant  vascular problems  I suspect the patient is c/o pseudoclaudication.  Patient should have an evaluation of the LS spine which I defer to the primary service or the Spine service.  The patient should continue walking and begin a more formal exercise program. The patient should continue his antiplatelet therapy and aggressive treatment of the lipid abnormalities.  Patient will follow-up with me on a PRN basis.   2. Coronary artery disease involving native coronary artery of native heart with angina pectoris ([place]) Continue cardiac and antihypertensive medications as already ordered and reviewed, no changes at this time.  Continue statin as ordered and reviewed, no changes at this time  Nitrates PRN for chest pain   3. Mixed hyperlipidemia Continue statin as ordered and reviewed, no changes at this time   4. Essential hypertension Continue antihypertensive medications as already ordered, these medications have been reviewed and there are no changes at this time.   5. PVD (peripheral vascular disease) ([place]) See #1 - VAS [place] ABI WITH/WO TBI    Hortencia Pilar, MD  05/17/2022 4:54 PM

## 2022-05-18 ENCOUNTER — Ambulatory Visit (INDEPENDENT_AMBULATORY_CARE_PROVIDER_SITE_OTHER): Payer: Medicare Other

## 2022-05-18 ENCOUNTER — Ambulatory Visit (INDEPENDENT_AMBULATORY_CARE_PROVIDER_SITE_OTHER): Payer: Medicare Other | Admitting: Vascular Surgery

## 2022-05-18 ENCOUNTER — Encounter (INDEPENDENT_AMBULATORY_CARE_PROVIDER_SITE_OTHER): Payer: Self-pay | Admitting: Vascular Surgery

## 2022-05-18 VITALS — BP 153/65 | HR 61 | Resp 16 | Ht 63.0 in | Wt 145.0 lb

## 2022-05-18 DIAGNOSIS — I739 Peripheral vascular disease, unspecified: Secondary | ICD-10-CM

## 2022-05-18 DIAGNOSIS — E782 Mixed hyperlipidemia: Secondary | ICD-10-CM

## 2022-05-18 DIAGNOSIS — I10 Essential (primary) hypertension: Secondary | ICD-10-CM

## 2022-05-18 DIAGNOSIS — I70213 Atherosclerosis of native arteries of extremities with intermittent claudication, bilateral legs: Secondary | ICD-10-CM | POA: Diagnosis not present

## 2022-05-18 DIAGNOSIS — I25119 Atherosclerotic heart disease of native coronary artery with unspecified angina pectoris: Secondary | ICD-10-CM

## 2022-05-24 ENCOUNTER — Encounter (INDEPENDENT_AMBULATORY_CARE_PROVIDER_SITE_OTHER): Payer: Self-pay | Admitting: Vascular Surgery

## 2022-05-29 ENCOUNTER — Other Ambulatory Visit: Payer: Self-pay | Admitting: Family Medicine

## 2022-05-29 NOTE — Telephone Encounter (Signed)
Requested Prescriptions  Pending Prescriptions Disp Refills  . atorvastatin (LIPITOR) 80 MG tablet [Pharmacy Med Name: ATORVASTATIN 80 MG TABLET] 90 tablet 0    Sig: TAKE 1 TABLET BY MOUTH EVERY DAY     Cardiovascular:  Antilipid - Statins Failed - 05/29/2022  2:11 AM      Failed - Lipid Panel in normal range within the last 12 months    Cholesterol, Total  Date Value Ref Range Status  12/22/2021 170 100 - 199 mg/dL Final   Cholesterol  Date Value Ref Range Status  05/16/2014 192 0 - 200 mg/dL Final   Cholesterol Piccolo, Waived  Date Value Ref Range Status  06/08/2016 235 (H) <200 mg/dL Final    Comment:                            Desirable                <200                         Borderline High      200- 239                         High                     >239    Ldl Cholesterol, Calc  Date Value Ref Range Status  05/16/2014 123 (H) 0 - 100 mg/dL Final   LDL Chol Calc (NIH)  Date Value Ref Range Status  12/22/2021 89 0 - 99 mg/dL Final   HDL Cholesterol  Date Value Ref Range Status  05/16/2014 48 40 - 60 mg/dL Final   HDL  Date Value Ref Range Status  12/22/2021 46 >39 mg/dL Final   Triglycerides  Date Value Ref Range Status  12/22/2021 204 (H) 0 - 149 mg/dL Final  05/16/2014 106 0 - 200 mg/dL Final   Triglycerides Piccolo,Waived  Date Value Ref Range Status  06/08/2016 147 <150 mg/dL Final    Comment:                            Normal                   <150                         Borderline High     150 - 199                         High                200 - 499                         Very High                >499          Passed - Patient is not pregnant      Passed - Valid encounter within last 12 months    Recent Outpatient Visits          5 months ago Benign hypertensive renal disease   [facility] [place], Megan P, DO   11 months ago Routine general medical  examination at a health care facility   [place], [place], DO   1 year ago Chest pain, unspecified type   [facility] Valerie Roys, DO   2 years ago Encounter for [employer] annual wellness exam   [place], [place], DO   2 years ago [place], Barb Merino, DO      Future Appointments            In 3 weeks Wynetta Emery, Barb Merino, DO [facility], [facility]

## 2022-06-25 ENCOUNTER — Ambulatory Visit (INDEPENDENT_AMBULATORY_CARE_PROVIDER_SITE_OTHER): Payer: Medicare Other | Admitting: Family Medicine

## 2022-06-25 ENCOUNTER — Encounter: Payer: Self-pay | Admitting: Family Medicine

## 2022-06-25 VITALS — BP 135/76 | HR 60 | Temp 97.9°F | Ht 63.0 in | Wt 144.7 lb

## 2022-06-25 DIAGNOSIS — I129 Hypertensive chronic kidney disease with stage 1 through stage 4 chronic kidney disease, or unspecified chronic kidney disease: Secondary | ICD-10-CM | POA: Diagnosis not present

## 2022-06-25 DIAGNOSIS — Z13820 Encounter for screening for osteoporosis: Secondary | ICD-10-CM

## 2022-06-25 DIAGNOSIS — F3340 Major depressive disorder, recurrent, in remission, unspecified: Secondary | ICD-10-CM | POA: Diagnosis not present

## 2022-06-25 DIAGNOSIS — I70213 Atherosclerosis of native arteries of extremities with intermittent claudication, bilateral legs: Secondary | ICD-10-CM | POA: Diagnosis not present

## 2022-06-25 DIAGNOSIS — I700 Atherosclerosis of aorta: Secondary | ICD-10-CM | POA: Diagnosis not present

## 2022-06-25 DIAGNOSIS — I25119 Atherosclerotic heart disease of native coronary artery with unspecified angina pectoris: Secondary | ICD-10-CM

## 2022-06-25 DIAGNOSIS — H9193 Unspecified hearing loss, bilateral: Secondary | ICD-10-CM

## 2022-06-25 DIAGNOSIS — D692 Other nonthrombocytopenic purpura: Secondary | ICD-10-CM

## 2022-06-25 DIAGNOSIS — E782 Mixed hyperlipidemia: Secondary | ICD-10-CM

## 2022-06-25 DIAGNOSIS — Z0000 Encounter for general adult medical examination without abnormal findings: Secondary | ICD-10-CM

## 2022-06-25 DIAGNOSIS — Z1283 Encounter for screening for malignant neoplasm of skin: Secondary | ICD-10-CM

## 2022-06-25 DIAGNOSIS — Z7189 Other specified counseling: Secondary | ICD-10-CM

## 2022-06-25 LAB — MICROSCOPIC EXAMINATION: Bacteria, UA: NONE SEEN

## 2022-06-25 LAB — URINALYSIS, ROUTINE W REFLEX MICROSCOPIC
Bilirubin, UA: NEGATIVE
Glucose, UA: NEGATIVE
Ketones, UA: NEGATIVE
Nitrite, UA: NEGATIVE
Protein,UA: NEGATIVE
Specific Gravity, UA: 1.015 (ref 1.005–1.030)
Urobilinogen, Ur: 0.2 mg/dL (ref 0.2–1.0)
pH, UA: 5.5 (ref 5.0–7.5)

## 2022-06-25 LAB — MICROALBUMIN, URINE WAIVED
Creatinine, Urine Waived: 100 mg/dL (ref 10–300)
Microalb, Ur Waived: 10 mg/L (ref 0–19)
Microalb/Creat Ratio: <30 mg/g (ref <30)

## 2022-06-25 MED ORDER — ATORVASTATIN CALCIUM 80 MG PO TABS: 80.0000 mg | ORAL_TABLET | Freq: Every day | ORAL | 1 refills | Status: DC

## 2022-06-25 NOTE — Progress Notes (Signed)
BP 135/76   Pulse 60   Temp 97.9 F (36.6 C)   Ht '5\' 3"'$  (1.6 m)   Wt 144 lb 11.2 oz (65.6 kg)   SpO2 99%   BMI 25.63 kg/m    Subjective:    Patient ID: Tonya Cabrera, female    DOB: [birth year], 71 y.o.   MRN: [id number]  HPI: Tonya Cabrera is a 71 y.o. female presenting on 06/25/2022 for comprehensive medical examination. Current medical complaints include:  HYPERTENSION / HYPERLIPIDEMIA Satisfied with current treatment? yes Duration of hypertension: chronic BP monitoring frequency: not checking BP medication side effects: no Past BP meds: metoprolol, lasix Duration of hyperlipidemia: chronic Cholesterol medication side effects: no Cholesterol supplements: none Past cholesterol medications: atorvastatin Medication compliance: excellent compliance Aspirin: yes Recent stressors: no Recurrent headaches: no Visual changes: no Palpitations: no Dyspnea: no Chest pain: no Lower extremity edema: no Dizzy/lightheaded: no  Menopausal Symptoms: no  Functional Status Survey: Is the patient deaf or have difficulty hearing?: Yes Does the patient have difficulty seeing, even when wearing glasses/contacts?: Yes Does the patient have difficulty concentrating, remembering, or making decisions?: No Does the patient have difficulty walking or climbing stairs?: No Does the patient have difficulty dressing or bathing?: No Does the patient have difficulty doing errands alone such as visiting a doctor's office or shopping?: No     06/25/2022    1:15 PM 06/21/2022    9:15 AM 12/22/2021   11:10 AM 06/17/2021    6:16 PM 05/10/2020   10:42 AM  Fall Risk   Falls in the past year? 0 0 0 0 0  Number falls in past yr: 0  0 0 0  Injury with Fall? 0  0 0 0  Risk for fall due to : No Fall Risks  No Fall Risks No Fall Risks   Follow up Falls evaluation completed  Falls evaluation completed Falls evaluation completed     Depression Screen    06/25/2022    1:16 PM 12/22/2021   11:10 AM  06/23/2021    2:25 PM 06/23/2021    2:24 PM 06/17/2021    6:11 PM  Depression screen PHQ 2/9  Decreased Interest 0 0 0 0 0  Down, Depressed, Hopeless 0 0 0 0 0  PHQ - 2 Score 0 0 0 0 0  Altered sleeping '3 3 2    '$ Tired, decreased energy 0 0 0    Change in appetite 0 0 0    Feeling bad or failure about yourself  0 0 0    Trouble concentrating 0 0 2    Moving slowly or fidgety/restless 0 0 0    Suicidal thoughts 0 0 0    PHQ-9 Score '3 3 4    '$ Difficult doing work/chores Not difficult at all        Advanced Directives Does patient have a HCPOA?    no Does patient have a living will or MOST form?  no  Past Medical History:  Past Medical History:  Diagnosis Date   Acute gastric ulcer without hemorrhage or perforation    Acute osteomyelitis of hand ([place]) 03/01/2015   Acute posthemorrhagic anemia    AKI (acute kidney injury) ([place]) 02/28/2015   Arthritis    Benign neoplasm of cecum    Coronary artery disease    coronary stent   Dyspnea    Gastritis, Helicobacter pylori    Hard of hearing    Heart murmur    Hypercholesteremia  Hypertension    Myocardial infarction [facility])    2015   Osteoporosis    Peptic ulcer    Special screening for malignant neoplasms, colon    Stomach irritation    Stroke ([place])    10-12 years ago    Surgical History:  Past Surgical History:  Procedure Laterality Date   CARDIAC SURGERY  05/09/2021   CAROTID STENT     CATARACT EXTRACTION W/PHACO Right 11/09/2017   Procedure: CATARACT EXTRACTION PHACO AND INTRAOCULAR LENS PLACEMENT ([place]) RIGHT;  Surgeon: Eulogio Bear, MD;  Location: [place];  Service: Ophthalmology;  Laterality: Right;   CATARACT EXTRACTION W/PHACO Left 11/23/2017   Procedure: CATARACT EXTRACTION PHACO AND INTRAOCULAR LENS PLACEMENT ([place]) LEFT;  Surgeon: Eulogio Bear, MD;  Location: [place];  Service: Ophthalmology;  Laterality: Left;   COLONOSCOPY WITH PROPOFOL N/A 10/06/2018   Procedure: COLONOSCOPY  WITH PROPOFOL;  Surgeon: Virgel Manifold, MD;  Location: [facility];  Service: Endoscopy;  Laterality: N/A;   ESOPHAGOGASTRODUODENOSCOPY (EGD) WITH PROPOFOL N/A 02/22/2018   Procedure: ESOPHAGOGASTRODUODENOSCOPY (EGD) WITH PROPOFOL;  Surgeon: Virgel Manifold, MD;  Location: [facility];  Service: Endoscopy;  Laterality: N/A;   ESOPHAGOGASTRODUODENOSCOPY (EGD) WITH PROPOFOL N/A 06/08/2018   Procedure: ESOPHAGOGASTRODUODENOSCOPY (EGD) WITH PROPOFOL;  Surgeon: Virgel Manifold, MD;  Location: [facility];  Service: Endoscopy;  Laterality: N/A;   ESOPHAGOGASTRODUODENOSCOPY (EGD) WITH PROPOFOL N/A 10/06/2018   Procedure: ESOPHAGOGASTRODUODENOSCOPY (EGD) WITH PROPOFOL;  Surgeon: Virgel Manifold, MD;  Location: [facility];  Service: Endoscopy;  Laterality: N/A;   INCONTINENCE SURGERY     LEFT HEART CATH AND CORONARY ANGIOGRAPHY N/A 02/06/2021   Procedure: LEFT HEART CATH AND CORONARY ANGIOGRAPHY;  Surgeon: Yolonda Kida, MD;  Location: [facility];  Service: Cardiovascular;  Laterality: N/A;   THYROID SURGERY      Medications:  Current Outpatient Medications on File Prior to Visit  Medication Sig   alendronate (FOSAMAX) 70 MG tablet TAKE 1 TABLET BY MOUTH EVERY SUNDAY. TAKE WITH A FULL GLASS OF WATER ON AN EMPTY STOMACH.   aspirin 81 MG EC tablet Take 1 tablet (81 mg total) by mouth daily. TAKE 1 TABLET (81 MG TOTAL) BY MOUTH DAILY.   furosemide (LASIX) 20 MG tablet Take 20 mg by mouth daily.   metoprolol tartrate (LOPRESSOR) 25 MG tablet SMARTSIG:1 Tablet(s) By Mouth Every 12 Hours   nitroGLYCERIN (NITROSTAT) 0.4 MG SL tablet Place 0.4 mg under the tongue every 5 (five) minutes as needed for chest pain.   potassium chloride SA (KLOR-CON M) 20 MEQ tablet Take 1 tablet by mouth 2 (two) times daily.   No current facility-administered medications on file prior to visit.    Allergies:  No Known Allergies  Social History:  Social History    Socioeconomic History   Marital status: Divorced    Spouse name: Not on file   Number of children: Not on file   Years of education: Not on file   Highest education level: Not on file  Occupational History   Occupation: retired   Occupation: part Teacher, early years/pre  Tobacco Use   Smoking status: Former    Packs/day: 1.50    Years: 22.00    Total pack years: 33.00    Types: Cigarettes    Quit date: 05/08/2014    Years since quitting: 8.1   Smokeless tobacco: Never   Tobacco comments:    Smoked over 25 years, quit after Heart Attack in 2015.    Vaping Use  Vaping Use: Never used  Substance and Sexual Activity   Alcohol use: No    Alcohol/week: 0.0 standard drinks of alcohol   Drug use: No   Sexual activity: Not Currently  Other Topics Concern   Not on file  Social History Narrative   Not on file   Social Determinants of Health   Financial Resource Strain: Low Risk  (06/17/2021)   Overall Financial Resource Strain (CARDIA)    Difficulty of Paying Living Expenses: Not very hard  Food Insecurity: No Food Insecurity (06/17/2021)   Hunger Vital Sign    Worried About Running Out of Food in the Last Year: Never true    Ran Out of Food in the Last Year: Never true  Transportation Needs: No Transportation Needs (06/17/2021)   PRAPARE - Hydrologist (Medical): No    Lack of Transportation (Non-Medical): No  Physical Activity: Sufficiently Active (06/17/2021)   Exercise Vital Sign    Days of Exercise per Week: 7 days    Minutes of Exercise per Session: 60 min  Stress: Stress Concern Present (06/17/2021)   [place]    Feeling of Stress : Rather much  Social Connections: Socially Isolated (06/17/2021)   Social Connection and Isolation Panel [NHANES]    Frequency of Communication with Friends and Family: More than three times a week    Frequency of Social Gatherings with Friends and Family:  Three times a week    Attends Religious Services: Never    Active Member of Clubs or Organizations: No    Attends Archivist Meetings: Never    Marital Status: Divorced  Human resources officer Violence: Not At Risk (06/17/2021)   Humiliation, Afraid, Rape, and Kick questionnaire    Fear of Current or Ex-Partner: No    Emotionally Abused: No    Physically Abused: No    Sexually Abused: No   Social History   Tobacco Use  Smoking Status Former   Packs/day: 1.50   Years: 22.00   Total pack years: 33.00   Types: Cigarettes   Quit date: 05/08/2014   Years since quitting: 8.1  Smokeless Tobacco Never  Tobacco Comments   Smoked over 25 years, quit after Heart Attack in 2015.     Social History   Substance and Sexual Activity  Alcohol Use No   Alcohol/week: 0.0 standard drinks of alcohol    Family History:  Family History  Problem Relation Age of Onset   Liver cancer Brother    Heart Problems Mother    Breast cancer Neg Hx     Past medical history, surgical history, medications, allergies, family history and social history reviewed with patient today and changes made to appropriate areas of the chart.   Review of Systems  Constitutional: Negative.   HENT:  Positive for hearing loss and tinnitus. Negative for congestion, ear discharge, ear pain, nosebleeds, sinus pain and sore throat.   Eyes: Negative.   Respiratory: Negative.  Negative for stridor.   Cardiovascular:  Positive for chest pain. Negative for palpitations, orthopnea, claudication, leg swelling and PND.  Gastrointestinal: Negative.   Genitourinary: Negative.   Musculoskeletal: Negative.   Skin: Negative.   Neurological: Negative.   Endo/Heme/Allergies: Negative.   Psychiatric/Behavioral: Negative.      All other ROS negative except what is listed above and in the HPI.      Objective:    BP 135/76   Pulse 60   Temp 97.9  F (36.6 C)   Ht '5\' 3"'$  (1.6 m)   Wt 144 lb 11.2 oz (65.6 kg)   SpO2 99%    BMI 25.63 kg/m   Wt Readings from Last 3 Encounters:  06/25/22 144 lb 11.2 oz (65.6 kg)  05/18/22 145 lb (65.8 kg)  12/22/21 150 lb 3.2 oz (68.1 kg)    Physical Exam Vitals and nursing note reviewed.  Constitutional:      General: She is not in acute distress.    Appearance: Normal appearance. She is normal weight. She is not ill-appearing, toxic-appearing or diaphoretic.  HENT:     Head: Normocephalic and atraumatic.     Right Ear: Tympanic membrane, ear canal and external ear normal. There is no impacted cerumen.     Left Ear: Tympanic membrane, ear canal and external ear normal. There is no impacted cerumen.     Nose: Nose normal. No congestion or rhinorrhea.     Mouth/Throat:     Mouth: Mucous membranes are moist.     Pharynx: Oropharynx is clear. No oropharyngeal exudate or posterior oropharyngeal erythema.  Eyes:     General: No scleral icterus.       Right eye: No discharge.        Left eye: No discharge.     Extraocular Movements: Extraocular movements intact.     Conjunctiva/sclera: Conjunctivae normal.     Pupils: Pupils are equal, round, and reactive to light.  Neck:     Vascular: No carotid bruit.  Cardiovascular:     Rate and Rhythm: Normal rate and regular rhythm.     Pulses: Normal pulses.     Heart sounds: No murmur heard.    No friction rub. No gallop.  Pulmonary:     Effort: Pulmonary effort is normal. No respiratory distress.     Breath sounds: Normal breath sounds. No stridor. No wheezing, rhonchi or rales.  Chest:     Chest wall: No tenderness.  Abdominal:     General: Abdomen is flat. Bowel sounds are normal. There is no distension.     Palpations: Abdomen is soft. There is no mass.     Tenderness: There is no abdominal tenderness. There is no right CVA tenderness, left CVA tenderness, guarding or rebound.     Hernia: No hernia is present.  Genitourinary:    Comments: Breast and pelvic exams deferred with shared decision making Musculoskeletal:         General: No swelling, tenderness, deformity or signs of injury.     Cervical back: Normal range of motion and neck supple. No rigidity. No muscular tenderness.     Right lower leg: No edema.     Left lower leg: No edema.  Lymphadenopathy:     Cervical: No cervical adenopathy.  Skin:    General: Skin is warm and dry.     Capillary Refill: Capillary refill takes less than 2 seconds.     Coloration: Skin is not jaundiced or pale.     Findings: No bruising, erythema, lesion or rash.  Neurological:     General: No focal deficit present.     Mental Status: She is alert and oriented to person, place, and time. Mental status is at baseline.     Cranial Nerves: No cranial nerve deficit.     Sensory: No sensory deficit.     Motor: No weakness.     Coordination: Coordination normal.     Gait: Gait normal.     Deep Tendon Reflexes:  Reflexes normal.  Psychiatric:        Mood and Affect: Mood normal.        Behavior: Behavior normal.        Thought Content: Thought content normal.        Judgment: Judgment normal.        06/17/2021    6:17 PM 05/10/2020   10:42 AM 05/02/2019    3:30 PM 06/02/2017    1:22 PM  6CIT Screen  What Year? 0 points 0 points 0 points 0 points  What month? 0 points 0 points 0 points 0 points  What time? 0 points 0 points 0 points 0 points  Count back from 20 0 points 0 points 0 points 0 points  Months in reverse 0 points 0 points 0 points 0 points  Repeat phrase 0 points 0 points 0 points 0 points  Total Score 0 points 0 points 0 points 0 points     Results for orders placed or performed in visit on 06/25/22  Microscopic Examination   Urine  Result Value Ref Range   WBC, UA 0-5 0 - 5 /hpf   RBC, Urine 0-2 0 - 2 /hpf   Epithelial Cells (non renal) 0-10 0 - 10 /hpf   Bacteria, UA None seen None seen/Few  Urinalysis, Routine w reflex microscopic  Result Value Ref Range   Specific Gravity, UA 1.015 1.005 - 1.030   pH, UA 5.5 5.0 - 7.5   Color, UA Yellow  Yellow   Appearance Ur Clear Clear   Leukocytes,UA 1+ (A) Negative   Protein,UA Negative Negative/Trace   Glucose, UA Negative Negative   Ketones, UA Negative Negative   RBC, UA 2+ (A) Negative   Bilirubin, UA Negative Negative   Urobilinogen, Ur 0.2 0.2 - 1.0 mg/dL   Nitrite, UA Negative Negative   Microscopic Examination See below:   Microalbumin, Urine Waived  Result Value Ref Range   Microalb, Ur Waived 10 0 - 19 mg/L   Creatinine, Urine Waived 100 10 - 300 mg/dL   Microalb/Creat Ratio <30 <30 mg/g      Assessment & Plan:   Problem List Items Addressed This Visit       Cardiovascular and Mediastinum   CAD (coronary artery disease)    Continue to follow with cardiology. Will keep BP and cholesterol under good control. Call with any concerns.       Relevant Medications   atorvastatin (LIPITOR) 80 MG tablet   Other Relevant Orders   CBC with Differential/Platelet   Comprehensive metabolic panel   Aortic atherosclerosis (HCC)    Will keep BP and cholesterol under good control. Call with any concerns.       Relevant Medications   atorvastatin (LIPITOR) 80 MG tablet   Other Relevant Orders   CBC with Differential/Platelet   Comprehensive metabolic panel   Senile purpura (HCC)    Stable. Continue to monitor.       Relevant Medications   atorvastatin (LIPITOR) 80 MG tablet   Other Relevant Orders   CBC with Differential/Platelet   Comprehensive metabolic panel   Atherosclerosis of native arteries of extremity with intermittent claudication (HCC)    Will keep BP and cholesterol under good control. Call with any concerns.       Relevant Medications   atorvastatin (LIPITOR) 80 MG tablet     Genitourinary   Benign hypertensive renal disease    Under good control on current regimen. Continue current regimen. Continue to monitor. Call  with any concerns. Refills given. Labs drawn today.       Relevant Orders   CBC with Differential/Platelet   Comprehensive  metabolic panel   Urinalysis, Routine w reflex microscopic (Completed)   TSH   Microalbumin, Urine Waived (Completed)     Other   Hyperlipidemia    Under good control on current regimen. Continue current regimen. Continue to monitor. Call with any concerns. Refills given. Labs drawn today.       Relevant Medications   atorvastatin (LIPITOR) 80 MG tablet   Other Relevant Orders   CBC with Differential/Platelet   Comprehensive metabolic panel   Lipid Panel w/o Chol/HDL Ratio   Depression, major, recurrent, in remission ([place])    Doing well off medicine. Continue to monitor.       Relevant Orders   CBC with Differential/Platelet   Comprehensive metabolic panel   Advance directive discussed with patient    A voluntary discussion about advance care planning including the explanation and discussion of advance directives was extensively discussed  with the patient for 5 minutes with patient present.  Explanation about the health care proxy and Living will was reviewed and packet with forms with explanation of how to fill them out was given.  During this discussion, the patient was not able to identify a health care proxy and plans to fill out the paperwork required.  Patient was offered a separate [place] visit for further assistance with forms.         Other Visit Diagnoses     Encounter for annual wellness visit (AWV) in Medicare patient    -  Primary   Preventative care discussed today as below.    Routine general medical examination at a health care facility       Vaccines declined. Screening labs checked today. Mammo declined. Colonoscopy up to date. DEXA ordered today. Continue diet and exercise. Call with concerns.    Bilateral hearing loss, unspecified hearing loss type       Referral to audiology placed today.   Relevant Orders   Ambulatory referral to Audiology   Screening for skin cancer       Referral to dermatology placed today.   Relevant Orders    Ambulatory referral to Dermatology   Screening for osteoporosis       DEXA ordered today.   Relevant Orders   DG Bone Density        Preventative Services:  Health Risk Assessment and Personalized Prevention Plan: Done today Bone Mass Measurements: Ordered today Breast Cancer Screening: Declined CVD Screening: Done today Cervical Cancer Screening: N/A Colon Cancer Screening: up to date Depression Screening: Done today Diabetes Screening: Done today Glaucoma Screening: See your eye doctor Hepatitis B vaccine: N/A Hepatitis C screening: up to date HIV Screening: up to date Flu Vaccine: Declined Lung cancer Screening: N/A Obesity Screening: Done today Pneumonia Vaccines (2): Declined STI Screening: N/A  Follow up plan: Return in about 6 months (around 12/24/2022).   LABORATORY TESTING:  - Pap smear: not applicable  IMMUNIZATIONS:   - Tdap: Tetanus vaccination status reviewed: last tetanus booster within 10 years. - Influenza: Refused - Pneumovax: Up to date - Prevnar: Refused - Zostavax vaccine: Refused  SCREENING: -Mammogram: Declined  - Colonoscopy: Up to date  - Bone Density: Ordered today   PATIENT COUNSELING:   Advised to take 1 mg of folate supplement per day if capable of pregnancy.   Sexuality: Discussed sexually transmitted diseases, partner selection, use of condoms,  avoidance of unintended pregnancy  and contraceptive alternatives.   Advised to avoid cigarette smoking.  I discussed with the patient that most people either abstain from alcohol or drink within safe limits (<=14/week and <=4 drinks/occasion for males, <=7/weeks and <= 3 drinks/occasion for females) and that the risk for alcohol disorders and other health effects rises proportionally with the number of drinks per week and how often a drinker exceeds daily limits.  Discussed cessation/primary prevention of drug use and availability of treatment for abuse.   Diet: Encouraged to adjust  caloric intake to maintain  or achieve ideal body weight, to reduce intake of dietary saturated fat and total fat, to limit sodium intake by avoiding high sodium foods and not adding table salt, and to maintain adequate dietary potassium and calcium preferably from fresh fruits, vegetables, and low-fat dairy products.    stressed the importance of regular exercise  Injury prevention: Discussed safety belts, safety helmets, smoke detector, smoking near bedding or upholstery.   Dental health: Discussed importance of regular tooth brushing, flossing, and dental visits.    NEXT PREVENTATIVE PHYSICAL DUE IN 1 YEAR. Return in about 6 months (around 12/24/2022).

## 2022-06-25 NOTE — Assessment & Plan Note (Signed)
Under good control on current regimen. Continue current regimen. Continue to monitor. Call with any concerns. Refills given. Labs drawn today.   

## 2022-06-25 NOTE — Patient Instructions (Addendum)
Please call to schedule your bone density: [facility] [place]: [address], [place], [place] [address] Phone: [phone]  [place] [facility] [address]. [place],  [place]  [address] Phone: [phone]   Preventative Services:  Health Risk Assessment and Personalized Prevention Plan: Done today Bone Mass Measurements: Ordered today Breast Cancer Screening: Declined CVD Screening: Done today Cervical Cancer Screening: N/A Colon Cancer Screening: up to date Depression Screening: Done today Diabetes Screening: Done today Glaucoma Screening: See your eye doctor Hepatitis B vaccine: N/A Hepatitis C screening: up to date HIV Screening: up to date Flu Vaccine: Declined Lung cancer Screening: N/A Obesity Screening: Done today Pneumonia Vaccines (2): Declined STI Screening: N/A

## 2022-06-25 NOTE — Assessment & Plan Note (Signed)
Continue to follow with cardiology. Will keep BP and cholesterol under good control. Call with any concerns.  

## 2022-06-25 NOTE — Assessment & Plan Note (Signed)
Stable.       - Continue to monitor

## 2022-06-25 NOTE — Assessment & Plan Note (Signed)
Will keep BP and cholesterol under good control. Call with any concerns.  ?

## 2022-06-25 NOTE — Assessment & Plan Note (Signed)
Doing well off medicine. Continue to monitor.

## 2022-06-25 NOTE — Assessment & Plan Note (Signed)
A voluntary discussion about advance care planning including the explanation and discussion of advance directives was extensively discussed  with the patient for 5 minutes with patient present.  Explanation about the health care proxy and Living will was reviewed and packet with forms with explanation of how to fill them out was given.  During this discussion, the patient was not able to identify a health care proxy and plans to fill out the paperwork required.  Patient was offered a separate Advance Care Planning visit for further assistance with forms.    

## 2022-06-26 LAB — CBC WITH DIFFERENTIAL/PLATELET
Basophils Absolute: 0 10*3/uL (ref 0.0–0.2)
Basos: 0 %
EOS (ABSOLUTE): 0.1 10*3/uL (ref 0.0–0.4)
Eos: 2 %
Hematocrit: 40.5 % (ref 34.0–46.6)
Hemoglobin: 13.7 g/dL (ref 11.1–15.9)
Immature Grans (Abs): 0 10*3/uL (ref 0.0–0.1)
Immature Granulocytes: 0 %
Lymphocytes Absolute: 2.1 10*3/uL (ref 0.7–3.1)
Lymphs: 26 %
MCH: 31.4 pg (ref 26.6–33.0)
MCHC: 33.8 g/dL (ref 31.5–35.7)
MCV: 93 fL (ref 79–97)
Monocytes Absolute: 0.5 10*3/uL (ref 0.1–0.9)
Monocytes: 6 %
Neutrophils Absolute: 5.2 10*3/uL (ref 1.4–7.0)
Neutrophils: 66 %
Platelets: 230 10*3/uL (ref 150–450)
RBC: 4.37 x10E6/uL (ref 3.77–5.28)
RDW: 13.3 % (ref 11.7–15.4)
WBC: 8 10*3/uL (ref 3.4–10.8)

## 2022-06-26 LAB — COMPREHENSIVE METABOLIC PANEL
ALT: 23 IU/L (ref 0–32)
AST: 23 IU/L (ref 0–40)
Albumin/Globulin Ratio: 2.3 — ABNORMAL HIGH (ref 1.2–2.2)
Albumin: 4.9 g/dL — ABNORMAL HIGH (ref 3.8–4.8)
Alkaline Phosphatase: 113 IU/L (ref 44–121)
BUN/Creatinine Ratio: 14 (ref 12–28)
BUN: 13 mg/dL (ref 8–27)
Bilirubin Total: 1.8 mg/dL — ABNORMAL HIGH (ref 0.0–1.2)
CO2: 24 mmol/L (ref 20–29)
Calcium: 9.9 mg/dL (ref 8.7–10.3)
Chloride: 103 mmol/L (ref 96–106)
Creatinine, Ser: 0.91 mg/dL (ref 0.57–1.00)
Globulin, Total: 2.1 g/dL (ref 1.5–4.5)
Glucose: 87 mg/dL (ref 70–99)
Potassium: 3.8 mmol/L (ref 3.5–5.2)
Sodium: 142 mmol/L (ref 134–144)
Total Protein: 7 g/dL (ref 6.0–8.5)
eGFR: 67 mL/min/{1.73_m2} (ref >59)

## 2022-06-26 LAB — LIPID PANEL W/O CHOL/HDL RATIO
Cholesterol, Total: 184 mg/dL (ref 100–199)
HDL: 53 mg/dL (ref >39)
LDL Chol Calc (NIH): 104 mg/dL — ABNORMAL HIGH (ref 0–99)
Triglycerides: 153 mg/dL — ABNORMAL HIGH (ref 0–149)
VLDL Cholesterol Cal: 27 mg/dL (ref 5–40)

## 2022-06-26 LAB — TSH: TSH: 3.7 u[IU]/mL (ref 0.450–4.500)

## 2022-07-01 DIAGNOSIS — D3131 Benign neoplasm of right choroid: Secondary | ICD-10-CM | POA: Diagnosis not present

## 2022-07-27 ENCOUNTER — Encounter (INDEPENDENT_AMBULATORY_CARE_PROVIDER_SITE_OTHER): Payer: Self-pay

## 2022-12-24 ENCOUNTER — Ambulatory Visit (INDEPENDENT_AMBULATORY_CARE_PROVIDER_SITE_OTHER): Payer: 59 | Admitting: Family Medicine

## 2022-12-24 ENCOUNTER — Encounter: Payer: Self-pay | Admitting: Family Medicine

## 2022-12-24 VITALS — BP 152/68 | HR 68 | Temp 98.3°F | Ht 63.0 in | Wt 146.5 lb

## 2022-12-24 DIAGNOSIS — I700 Atherosclerosis of aorta: Secondary | ICD-10-CM

## 2022-12-24 DIAGNOSIS — I129 Hypertensive chronic kidney disease with stage 1 through stage 4 chronic kidney disease, or unspecified chronic kidney disease: Secondary | ICD-10-CM | POA: Diagnosis not present

## 2022-12-24 DIAGNOSIS — I70213 Atherosclerosis of native arteries of extremities with intermittent claudication, bilateral legs: Secondary | ICD-10-CM | POA: Diagnosis not present

## 2022-12-24 DIAGNOSIS — I25119 Atherosclerotic heart disease of native coronary artery with unspecified angina pectoris: Secondary | ICD-10-CM

## 2022-12-24 DIAGNOSIS — D692 Other nonthrombocytopenic purpura: Secondary | ICD-10-CM | POA: Diagnosis not present

## 2022-12-24 DIAGNOSIS — E782 Mixed hyperlipidemia: Secondary | ICD-10-CM | POA: Diagnosis not present

## 2022-12-24 DIAGNOSIS — N766 Ulceration of vulva: Secondary | ICD-10-CM

## 2022-12-24 DIAGNOSIS — F3340 Major depressive disorder, recurrent, in remission, unspecified: Secondary | ICD-10-CM

## 2022-12-24 MED ORDER — ATORVASTATIN CALCIUM 80 MG PO TABS: 80.0000 mg | ORAL_TABLET | Freq: Every day | ORAL | 1 refills | Status: DC

## 2022-12-24 MED ORDER — MUPIROCIN 2 % EX OINT
1.0000 | TOPICAL_OINTMENT | Freq: Two times a day (BID) | CUTANEOUS | 0 refills | Status: DC
Start: 2022-12-24 — End: 2023-05-25

## 2022-12-24 MED ORDER — CLOBETASOL PROPIONATE 0.05 % EX OINT: 1.0000 | TOPICAL_OINTMENT | Freq: Every day | CUTANEOUS | 0 refills | Status: DC

## 2022-12-24 MED ORDER — METOPROLOL TARTRATE 25 MG PO TABS: ORAL_TABLET | ORAL | 1 refills | Status: DC

## 2022-12-24 NOTE — Patient Instructions (Addendum)
Patient is scheduled for DEXA Bone Density Scan on 02/10/23 at 10:20 am at [facility] location. If patient has any questions in regards to her appointment day and time. Patient may reach out to their facility directly at [phone].

## 2022-12-24 NOTE — Assessment & Plan Note (Signed)
Will keep BP and cholesterol under good control. Continue to monitor. Labs drawn today. Continue to follow with cardiology

## 2022-12-24 NOTE — Assessment & Plan Note (Signed)
Under good control on current regimen. Continue current regimen. Continue to monitor. Call with any concerns. Refills given. Labs drawn today.   

## 2022-12-24 NOTE — Assessment & Plan Note (Signed)
Reassured patient. Continue to monitor.  

## 2022-12-24 NOTE — Assessment & Plan Note (Signed)
Doing well not on medicine. Continue to monitor. Call with any concerns.  

## 2022-12-24 NOTE — Progress Notes (Signed)
BP (!) 152/68 (BP Location: Right Arm, Cuff Size: Normal)   Pulse 68   Temp 98.3 F (36.8 C) (Oral)   Ht 5\' 3"  (1.6 m)   Wt 146 lb 8 oz (66.5 kg)   SpO2 99%   BMI 25.95 kg/m    Subjective:    Patient ID: Tonya Cabrera, female    DOB: December 12, 1950, 72 y.o.   MRN: [id number]  HPI: Tonya Cabrera is a 72 y.o. female  Chief Complaint  Patient presents with   Hyperlipidemia   Has had a spot on the R lip of her labia. It's been bleeding and irritated. Has been going on for a few weeks.   HYPERTENSION / HYPERLIPIDEMIA Satisfied with current treatment? Has been out of her metoprolol for a few days Duration of hypertension: chronic BP monitoring frequency: not checking BP medication side effects: no Past BP meds: metoprolol, lasix Duration of hyperlipidemia: chronic Cholesterol medication side effects: no Cholesterol supplements: none Past cholesterol medications: atorvastatin Medication compliance: excellent compliance Aspirin: yes Recent stressors: no Recurrent headaches: no Visual changes: no Palpitations: no Dyspnea: no Chest pain: no Lower extremity edema: no Dizzy/lightheaded: no  DEPRESSION Mood status: controlled Satisfied with current treatment?: yes- not on anything Symptom severity: mild  Duration of current treatment : Not on anything Psychotherapy/counseling: no   Depressed mood: no Anxious mood: no Anhedonia: no Significant weight loss or gain: no Insomnia: no  Fatigue: no Feelings of worthlessness or guilt: no Impaired concentration/indecisiveness: no Suicidal ideations: no Hopelessness: no Crying spells: no    12/24/2022    2:04 PM 06/25/2022    1:16 PM 12/22/2021   11:10 AM 06/23/2021    2:25 PM 06/23/2021    2:24 PM  Depression screen PHQ 2/9  Decreased Interest 0 0 0 0 0  Down, Depressed, Hopeless 0 0 0 0 0  PHQ - 2 Score 0 0 0 0 0  Altered sleeping 3 3 3 2    Tired, decreased energy 0 0 0 0   Change in appetite 0 0 0 0   Feeling bad or  failure about yourself  0 0 0 0   Trouble concentrating 0 0 0 2   Moving slowly or fidgety/restless 0 0 0 0   Suicidal thoughts 0 0 0 0   PHQ-9 Score 3 3 3 4    Difficult doing work/chores Not difficult at all Not difficult at all        Relevant past medical, surgical, family and social history reviewed and updated as indicated. Interim medical history since our last visit reviewed. Allergies and medications reviewed and updated.  Review of Systems  Constitutional: Negative.   Respiratory: Negative.    Cardiovascular: Negative.   Musculoskeletal: Negative.   Neurological: Negative.   Psychiatric/Behavioral: Negative.      Per HPI unless specifically indicated above     Objective:    BP (!) 152/68 (BP Location: Right Arm, Cuff Size: Normal)   Pulse 68   Temp 98.3 F (36.8 C) (Oral)   Ht 5\' 3"  (1.6 m)   Wt 146 lb 8 oz (66.5 kg)   SpO2 99%   BMI 25.95 kg/m   Wt Readings from Last 3 Encounters:  12/24/22 146 lb 8 oz (66.5 kg)  06/25/22 144 lb 11.2 oz (65.6 kg)  05/18/22 145 lb (65.8 kg)    Physical Exam Vitals and nursing note reviewed. Exam conducted with a chaperone present.  Constitutional:      General: She is not  in acute distress.    Appearance: Normal appearance. She is not ill-appearing, toxic-appearing or diaphoretic.  HENT:     Head: Normocephalic and atraumatic.     Right Ear: External ear normal.     Left Ear: External ear normal.     Nose: Nose normal.     Mouth/Throat:     Mouth: Mucous membranes are moist.     Pharynx: Oropharynx is clear.  Eyes:     General: No scleral icterus.       Right eye: No discharge.        Left eye: No discharge.     Extraocular Movements: Extraocular movements intact.     Conjunctiva/sclera: Conjunctivae normal.     Pupils: Pupils are equal, round, and reactive to light.  Cardiovascular:     Rate and Rhythm: Normal rate and regular rhythm.     Pulses: Normal pulses.     Heart sounds: Normal heart sounds. No murmur  heard.    No friction rub. No gallop.  Pulmonary:     Effort: Pulmonary effort is normal. No respiratory distress.     Breath sounds: Normal breath sounds. No stridor. No wheezing, rhonchi or rales.  Chest:     Chest wall: No tenderness.  Genitourinary:   Musculoskeletal:        General: Normal range of motion.     Cervical back: Normal range of motion and neck supple.  Skin:    General: Skin is warm and dry.     Capillary Refill: Capillary refill takes less than 2 seconds.     Coloration: Skin is not jaundiced or pale.     Findings: No bruising, erythema, lesion or rash.  Neurological:     General: No focal deficit present.     Mental Status: She is alert and oriented to person, place, and time. Mental status is at baseline.  Psychiatric:        Mood and Affect: Mood normal.        Behavior: Behavior normal.        Thought Content: Thought content normal.        Judgment: Judgment normal.     Results for orders placed or performed in visit on 06/25/22  Microscopic Examination   Urine  Result Value Ref Range   WBC, UA 0-5 0 - 5 /hpf   RBC, Urine 0-2 0 - 2 /hpf   Epithelial Cells (non renal) 0-10 0 - 10 /hpf   Bacteria, UA None seen None seen/Few  CBC with Differential/Platelet  Result Value Ref Range   WBC 8.0 3.4 - 10.8 x10E3/uL   RBC 4.37 3.77 - 5.28 x10E6/uL   Hemoglobin 13.7 11.1 - 15.9 g/dL   Hematocrit 40.5 34.0 - 46.6 %   MCV 93 79 - 97 fL   MCH 31.4 26.6 - 33.0 pg   MCHC 33.8 31.5 - 35.7 g/dL   RDW 13.3 11.7 - 15.4 %   Platelets 230 150 - 450 x10E3/uL   Neutrophils 66 Not Estab. %   Lymphs 26 Not Estab. %   Monocytes 6 Not Estab. %   Eos 2 Not Estab. %   Basos 0 Not Estab. %   Neutrophils Absolute 5.2 1.4 - 7.0 x10E3/uL   Lymphocytes Absolute 2.1 0.7 - 3.1 x10E3/uL   Monocytes Absolute 0.5 0.1 - 0.9 x10E3/uL   EOS (ABSOLUTE) 0.1 0.0 - 0.4 x10E3/uL   Basophils Absolute 0.0 0.0 - 0.2 x10E3/uL   Immature Granulocytes 0 Not Estab. %  Immature Grans  (Abs) 0.0 0.0 - 0.1 x10E3/uL  Comprehensive metabolic panel  Result Value Ref Range   Glucose 87 70 - 99 mg/dL   BUN 13 8 - 27 mg/dL   Creatinine, Ser 0.91 0.57 - 1.00 mg/dL   eGFR 67 >59 mL/min/1.73   BUN/Creatinine Ratio 14 12 - 28   Sodium 142 134 - 144 mmol/L   Potassium 3.8 3.5 - 5.2 mmol/L   Chloride 103 96 - 106 mmol/L   CO2 24 20 - 29 mmol/L   Calcium 9.9 8.7 - 10.3 mg/dL   Total Protein 7.0 6.0 - 8.5 g/dL   Albumin 4.9 (H) 3.8 - 4.8 g/dL   Globulin, Total 2.1 1.5 - 4.5 g/dL   Albumin/Globulin Ratio 2.3 (H) 1.2 - 2.2   Bilirubin Total 1.8 (H) 0.0 - 1.2 mg/dL   Alkaline Phosphatase 113 44 - 121 IU/L   AST 23 0 - 40 IU/L   ALT 23 0 - 32 IU/L  Lipid Panel w/o Chol/HDL Ratio  Result Value Ref Range   Cholesterol, Total 184 100 - 199 mg/dL   Triglycerides 153 (H) 0 - 149 mg/dL   HDL 53 >39 mg/dL   VLDL Cholesterol Cal 27 5 - 40 mg/dL   LDL Chol Calc (NIH) 104 (H) 0 - 99 mg/dL  Urinalysis, Routine w reflex microscopic  Result Value Ref Range   Specific Gravity, UA 1.015 1.005 - 1.030   pH, UA 5.5 5.0 - 7.5   Color, UA Yellow Yellow   Appearance Ur Clear Clear   Leukocytes,UA 1+ (A) Negative   Protein,UA Negative Negative/Trace   Glucose, UA Negative Negative   Ketones, UA Negative Negative   RBC, UA 2+ (A) Negative   Bilirubin, UA Negative Negative   Urobilinogen, Ur 0.2 0.2 - 1.0 mg/dL   Nitrite, UA Negative Negative   Microscopic Examination See below:   TSH  Result Value Ref Range   TSH 3.700 0.450 - 4.500 uIU/mL  Microalbumin, Urine Waived  Result Value Ref Range   Microalb, Ur Waived 10 0 - 19 mg/L   Creatinine, Urine Waived 100 10 - 300 mg/dL   Microalb/Creat Ratio <30 <30 mg/g      Assessment & Plan:   Problem List Items Addressed This Visit       Cardiovascular and Mediastinum   CAD (coronary artery disease)    Will keep BP and cholesterol under good control. Continue to monitor. Labs drawn today. Continue to follow with cardiology       Relevant Medications   atorvastatin (LIPITOR) 80 MG tablet   metoprolol tartrate (LOPRESSOR) 25 MG tablet   Aortic atherosclerosis (HCC)    Will keep BP and cholesterol under good control. Continue to monitor. Labs drawn today.       Relevant Medications   atorvastatin (LIPITOR) 80 MG tablet   metoprolol tartrate (LOPRESSOR) 25 MG tablet   Other Relevant Orders   Comprehensive metabolic panel   CBC with Differential/Platelet   Lipid Panel w/o Chol/HDL Ratio   Senile purpura ([place])    Reassured patient. Continue to monitor.       Relevant Medications   atorvastatin (LIPITOR) 80 MG tablet   metoprolol tartrate (LOPRESSOR) 25 MG tablet   Atherosclerosis of native arteries of extremity with intermittent claudication (HCC)    Will keep BP and cholesterol under good control. Continue to monitor. Labs drawn today.       Relevant Medications   atorvastatin (LIPITOR) 80 MG tablet   metoprolol  tartrate (LOPRESSOR) 25 MG tablet   Other Relevant Orders   Comprehensive metabolic panel   CBC with Differential/Platelet   Lipid Panel w/o Chol/HDL Ratio     Genitourinary   Benign hypertensive renal disease - Primary    Running a little high, out of metoprolol. Refilled and recheck 1 month.       Relevant Orders   Comprehensive metabolic panel   CBC with Differential/Platelet     Other   Hyperlipidemia    Under good control on current regimen. Continue current regimen. Continue to monitor. Call with any concerns. Refills given. Labs drawn today.        Relevant Medications   atorvastatin (LIPITOR) 80 MG tablet   metoprolol tartrate (LOPRESSOR) 25 MG tablet   Other Relevant Orders   Comprehensive metabolic panel   CBC with Differential/Platelet   Lipid Panel w/o Chol/HDL Ratio   Depression, major, recurrent, in remission ([place])    Doing well not on medicine. Continue to monitor. Call with any concerns.       Other Visit Diagnoses     Genital labial ulcer       Will treat  with bactroban and clobetasol, if not improved in 1 month will refer for biopsy        Follow up plan: Return in about 4 weeks (around 01/21/2023).

## 2022-12-24 NOTE — Assessment & Plan Note (Signed)
Will keep BP and cholesterol under good control. Continue to monitor. Labs drawn today.  ?

## 2022-12-24 NOTE — Assessment & Plan Note (Signed)
Running a little high, out of metoprolol. Refilled and recheck 1 month.

## 2022-12-25 ENCOUNTER — Ambulatory Visit
Admission: RE | Admit: 2022-12-25 | Discharge: 2022-12-25 | Disposition: A | Discharge status: Home / Self Care | Payer: 59 | Source: Ambulatory Visit | Attending: Acute Care | Admitting: Acute Care

## 2022-12-25 DIAGNOSIS — Z87891 Personal history of nicotine dependence: Secondary | ICD-10-CM | POA: Diagnosis not present

## 2022-12-25 LAB — COMPREHENSIVE METABOLIC PANEL
ALT: 24 IU/L (ref 0–32)
AST: 22 IU/L (ref 0–40)
Albumin/Globulin Ratio: 1.9 (ref 1.2–2.2)
Albumin: 4.5 g/dL (ref 3.8–4.8)
Alkaline Phosphatase: 128 IU/L — ABNORMAL HIGH (ref 44–121)
BUN/Creatinine Ratio: 14 (ref 12–28)
BUN: 14 mg/dL (ref 8–27)
Bilirubin Total: 1.7 mg/dL — ABNORMAL HIGH (ref 0.0–1.2)
CO2: 23 mmol/L (ref 20–29)
Calcium: 9.8 mg/dL (ref 8.7–10.3)
Chloride: 104 mmol/L (ref 96–106)
Creatinine, Ser: 1.01 mg/dL — ABNORMAL HIGH (ref 0.57–1.00)
Globulin, Total: 2.4 g/dL (ref 1.5–4.5)
Glucose: 79 mg/dL (ref 70–99)
Potassium: 4.3 mmol/L (ref 3.5–5.2)
Sodium: 141 mmol/L (ref 134–144)
Total Protein: 6.9 g/dL (ref 6.0–8.5)
eGFR: 60 mL/min/{1.73_m2} (ref >59)

## 2022-12-25 LAB — CBC WITH DIFFERENTIAL/PLATELET
Basophils Absolute: 0 10*3/uL (ref 0.0–0.2)
Basos: 0 %
EOS (ABSOLUTE): 0.2 10*3/uL (ref 0.0–0.4)
Eos: 2 %
Hematocrit: 40.7 % (ref 34.0–46.6)
Hemoglobin: 13.7 g/dL (ref 11.1–15.9)
Immature Grans (Abs): 0 10*3/uL (ref 0.0–0.1)
Immature Granulocytes: 1 %
Lymphocytes Absolute: 1.6 10*3/uL (ref 0.7–3.1)
Lymphs: 23 %
MCH: 31.2 pg (ref 26.6–33.0)
MCHC: 33.7 g/dL (ref 31.5–35.7)
MCV: 93 fL (ref 79–97)
Monocytes Absolute: 0.5 10*3/uL (ref 0.1–0.9)
Monocytes: 7 %
Neutrophils Absolute: 4.8 10*3/uL (ref 1.4–7.0)
Neutrophils: 67 %
Platelets: 216 10*3/uL (ref 150–450)
RBC: 4.39 x10E6/uL (ref 3.77–5.28)
RDW: 13.1 % (ref 11.7–15.4)
WBC: 7.1 10*3/uL (ref 3.4–10.8)

## 2022-12-25 LAB — LIPID PANEL W/O CHOL/HDL RATIO
Cholesterol, Total: 220 mg/dL — ABNORMAL HIGH (ref 100–199)
HDL: 58 mg/dL (ref >39)
LDL Chol Calc (NIH): 109 mg/dL — ABNORMAL HIGH (ref 0–99)
Triglycerides: 314 mg/dL — ABNORMAL HIGH (ref 0–149)
VLDL Cholesterol Cal: 53 mg/dL — ABNORMAL HIGH (ref 5–40)

## 2022-12-28 ENCOUNTER — Telehealth: Payer: Self-pay

## 2022-12-28 ENCOUNTER — Other Ambulatory Visit: Payer: Self-pay | Admitting: Acute Care

## 2022-12-28 DIAGNOSIS — Z122 Encounter for screening for malignant neoplasm of respiratory organs: Secondary | ICD-10-CM

## 2022-12-28 DIAGNOSIS — Z87891 Personal history of nicotine dependence: Secondary | ICD-10-CM

## 2022-12-28 NOTE — Telephone Encounter (Signed)
Patient called for lab results. Shared provider's note. No questions.   Everything looks nice and normal. Your cholesterol has gone up quite a bit, so watch your diet. We'll recheck it next time. Have a great day!  Written by Valerie Roys, DO on 12/25/2022 12:37 PM EDT

## 2023-01-21 ENCOUNTER — Encounter: Payer: Self-pay | Admitting: Family Medicine

## 2023-01-21 ENCOUNTER — Ambulatory Visit (INDEPENDENT_AMBULATORY_CARE_PROVIDER_SITE_OTHER): Payer: 59 | Admitting: Family Medicine

## 2023-01-21 VITALS — BP 138/70 | HR 68 | Temp 98.9°F | Ht 63.0 in | Wt 144.7 lb

## 2023-01-21 DIAGNOSIS — N766 Ulceration of vulva: Secondary | ICD-10-CM | POA: Diagnosis not present

## 2023-01-21 DIAGNOSIS — I129 Hypertensive chronic kidney disease with stage 1 through stage 4 chronic kidney disease, or unspecified chronic kidney disease: Secondary | ICD-10-CM | POA: Diagnosis not present

## 2023-01-21 NOTE — Assessment & Plan Note (Signed)
Under good control on current regimen. Continue current regimen. Continue to monitor. Call with any concerns. Refills up to date.   

## 2023-01-21 NOTE — Progress Notes (Signed)
BP 138/70 (BP Location: Right Arm, Cuff Size: Normal)   Pulse 68   Temp 98.9 F (37.2 C) (Oral)   Ht  (1.6 m)   Wt 144 lb 11.2 oz (65.6 kg)   SpO2 98%   BMI 25.63 kg/m    Subjective:    Patient ID: Tonya Cabrera, female    DOB: 1950-12-09, 72 y.o.   MRN: [id number]  HPI: Tonya Cabrera is a 72 y.o. female  Chief Complaint  Patient presents with   Hypertension   HYPERTENSION  Hypertension status: controlled  Satisfied with current treatment? yes Duration of hypertension: chronic BP monitoring frequency:  not checking BP medication side effects:  no Medication compliance: excellent compliance Previous BP meds:lasix, metoprolol Aspirin: yes Recurrent headaches: no Visual changes: no Palpitations: no Dyspnea: no Chest pain: no Lower extremity edema: no Dizzy/lightheaded: no  Ulcer is significantly better. Still there, but now smaller than a pee, no itching. Feeling much better.   Relevant past medical, surgical, family and social history reviewed and updated as indicated. Interim medical history since our last visit reviewed. Allergies and medications reviewed and updated.  Review of Systems  Constitutional: Negative.   Respiratory: Negative.    Cardiovascular: Negative.   Gastrointestinal: Negative.   Musculoskeletal: Negative.   Neurological: Negative.   Psychiatric/Behavioral: Negative.      Per HPI unless specifically indicated above     Objective:    BP 138/70 (BP Location: Right Arm, Cuff Size: Normal)   Pulse 68   Temp 98.9 F (37.2 C) (Oral)   Ht  (1.6 m)   Wt 144 lb 11.2 oz (65.6 kg)   SpO2 98%   BMI 25.63 kg/m   Wt Readings from Last 3 Encounters:  01/21/23 144 lb 11.2 oz (65.6 kg)  12/24/22 146 lb 8 oz (66.5 kg)  06/25/22 144 lb 11.2 oz (65.6 kg)    Physical Exam Vitals and nursing note reviewed.  Constitutional:      General: She is not in acute distress.    Appearance: Normal appearance. She is not ill-appearing,  toxic-appearing or diaphoretic.  HENT:     Head: Normocephalic and atraumatic.     Right Ear: External ear normal.     Left Ear: External ear normal.     Nose: Nose normal.     Mouth/Throat:     Mouth: Mucous membranes are moist.     Pharynx: Oropharynx is clear.  Eyes:     General: No scleral icterus.       Right eye: No discharge.        Left eye: No discharge.     Extraocular Movements: Extraocular movements intact.     Conjunctiva/sclera: Conjunctivae normal.     Pupils: Pupils are equal, round, and reactive to light.  Cardiovascular:     Rate and Rhythm: Normal rate and regular rhythm.     Pulses: Normal pulses.     Heart sounds: Normal heart sounds. No murmur heard.    No friction rub. No gallop.  Pulmonary:     Effort: Pulmonary effort is normal. No respiratory distress.     Breath sounds: Normal breath sounds. No stridor. No wheezing, rhonchi or rales.  Chest:     Chest wall: No tenderness.  Musculoskeletal:        General: Normal range of motion.     Cervical back: Normal range of motion and neck supple.  Skin:    General: Skin is warm and dry.  Capillary Refill: Capillary refill takes less than 2 seconds.     Coloration: Skin is not jaundiced or pale.     Findings: No bruising, erythema, lesion or rash.  Neurological:     General: No focal deficit present.     Mental Status: She is alert and oriented to person, place, and time. Mental status is at baseline.  Psychiatric:        Mood and Affect: Mood normal.        Behavior: Behavior normal.        Thought Content: Thought content normal.        Judgment: Judgment normal.     Results for orders placed or performed in visit on 12/24/22  Comprehensive metabolic panel  Result Value Ref Range   Glucose 79 70 - 99 mg/dL   BUN 14 8 - 27 mg/dL   Creatinine, Ser 1.61 (H) 0.57 - 1.00 mg/dL   eGFR 60 >09 UE/AVW/0.98   BUN/Creatinine Ratio 14 12 - 28   Sodium 141 134 - 144 mmol/L   Potassium 4.3 3.5 - 5.2 mmol/L    Chloride 104 96 - 106 mmol/L   CO2 23 20 - 29 mmol/L   Calcium 9.8 8.7 - 10.3 mg/dL   Total Protein 6.9 6.0 - 8.5 g/dL   Albumin 4.5 3.8 - 4.8 g/dL   Globulin, Total 2.4 1.5 - 4.5 g/dL   Albumin/Globulin Ratio 1.9 1.2 - 2.2   Bilirubin Total 1.7 (H) 0.0 - 1.2 mg/dL   Alkaline Phosphatase 128 (H) 44 - 121 IU/L   AST 22 0 - 40 IU/L   ALT 24 0 - 32 IU/L  CBC with Differential/Platelet  Result Value Ref Range   WBC 7.1 3.4 - 10.8 x10E3/uL   RBC 4.39 3.77 - 5.28 x10E6/uL   Hemoglobin 13.7 11.1 - 15.9 g/dL   Hematocrit 11.9 14.7 - 46.6 %   MCV 93 79 - 97 fL   MCH 31.2 26.6 - 33.0 pg   MCHC 33.7 31.5 - 35.7 g/dL   RDW 82.9 56.2 - 13.0 %   Platelets 216 150 - 450 x10E3/uL   Neutrophils 67 Not Estab. %   Lymphs 23 Not Estab. %   Monocytes 7 Not Estab. %   Eos 2 Not Estab. %   Basos 0 Not Estab. %   Neutrophils Absolute 4.8 1.4 - 7.0 x10E3/uL   Lymphocytes Absolute 1.6 0.7 - 3.1 x10E3/uL   Monocytes Absolute 0.5 0.1 - 0.9 x10E3/uL   EOS (ABSOLUTE) 0.2 0.0 - 0.4 x10E3/uL   Basophils Absolute 0.0 0.0 - 0.2 x10E3/uL   Immature Granulocytes 1 Not Estab. %   Immature Grans (Abs) 0.0 0.0 - 0.1 x10E3/uL  Lipid Panel w/o Chol/HDL Ratio  Result Value Ref Range   Cholesterol, Total 220 (H) 100 - 199 mg/dL   Triglycerides 865 (H) 0 - 149 mg/dL   HDL 58 >78 mg/dL   VLDL Cholesterol Cal 53 (H) 5 - 40 mg/dL   LDL Chol Calc (NIH) 469 (H) 0 - 99 mg/dL      Assessment & Plan:   Problem List Items Addressed This Visit       Genitourinary   Benign hypertensive renal disease - Primary    Under good control on current regimen. Continue current regimen. Continue to monitor. Call with any concerns. Refills up to date.       Other Visit Diagnoses     Genital labial ulcer       Nearly entirely healed.  Call if not gone in the next 2 weeks. Continue to monitor.        Follow up plan: Return in about 5 months (around 06/23/2023) for physical.

## 2023-01-25 ENCOUNTER — Ambulatory Visit (INDEPENDENT_AMBULATORY_CARE_PROVIDER_SITE_OTHER): Payer: 59

## 2023-01-25 ENCOUNTER — Ambulatory Visit
Admission: EM | Admit: 2023-01-25 | Discharge: 2023-01-25 | Disposition: A | Discharge status: Home / Self Care | Payer: 59 | Attending: Family Medicine | Admitting: Family Medicine

## 2023-01-25 DIAGNOSIS — R059 Cough, unspecified: Secondary | ICD-10-CM | POA: Diagnosis not present

## 2023-01-25 DIAGNOSIS — R062 Wheezing: Secondary | ICD-10-CM | POA: Diagnosis not present

## 2023-01-25 DIAGNOSIS — J439 Emphysema, unspecified: Secondary | ICD-10-CM

## 2023-01-25 DIAGNOSIS — H6121 Impacted cerumen, right ear: Secondary | ICD-10-CM

## 2023-01-25 MED ORDER — PREDNISONE 20 MG PO TABS: 40.0000 mg | ORAL_TABLET | Freq: Every day | ORAL | 0 refills | Status: AC

## 2023-01-25 MED ORDER — AZITHROMYCIN 250 MG PO TABS: 250.0000 mg | ORAL_TABLET | Freq: Every day | ORAL | 0 refills | Status: DC

## 2023-01-25 MED ORDER — PROMETHAZINE-DM 6.25-15 MG/5ML PO SYRP: 5.0000 mL | ORAL_SOLUTION | Freq: Four times a day (QID) | ORAL | 0 refills | Status: DC | PRN

## 2023-01-25 NOTE — Discharge Instructions (Addendum)
Stop by the pharmacy to pick up your prescriptions.  Follow up with your primary care provider as needed.  Stop by the pharmacy to pick up Debrox for earwax removal.

## 2023-01-25 NOTE — ED Triage Notes (Signed)
Pt c/o cough,wheezing & chest congestion x5 days. Denies any fevers, hx of asthma or COPD.

## 2023-01-25 NOTE — ED Provider Notes (Signed)
[facility]    CSN: [id number] Arrival date & time: 01/25/23  1241      History   Chief Complaint Chief Complaint  Patient presents with   Cough   Chest Congestion   Wheezing    HPI Tonya Cabrera is a 72 y.o. female.   HPI   Tonya Cabrera presents for coughing for the past 5 days. She had some tight chest pains that turned into wheezing. She feels like she is unable to get the phelgm up.  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 She got light headed.  She almost fell but someone caught her.  Has ear pain, headache, back pain and sore throat.  She is a former smoker. Denies history of COPD.      Past Medical History:  Diagnosis Date   Acute gastric ulcer without hemorrhage or perforation    Acute osteomyelitis of hand (HCC) 03/01/2015   Acute posthemorrhagic anemia    AKI (acute kidney injury) (HCC) 02/28/2015   Arthritis    Benign neoplasm of cecum    Coronary artery disease    coronary stent   Dyspnea    Gastritis, Helicobacter pylori    Hard of hearing    Heart murmur    Hypercholesteremia    Hypertension    Myocardial infarction (HCC)    2015   Osteoporosis    Peptic ulcer    Special screening for malignant neoplasms, colon    Stomach irritation    Stroke (HCC)    10-12 years ago    Patient Active Problem List   Diagnosis Date Noted   Atherosclerosis of native arteries of extremity with intermittent claudication (HCC) 05/17/2022   Senile purpura (HCC) 06/23/2021   Overactive bladder 06/19/2019   Urge incontinence of urine 06/19/2019   Advance directive discussed with patient 05/02/2019   Diverticulosis of large intestine without diverticulitis    Depression, major, recurrent, in remission (HCC) 03/04/2018   Symptomatic anemia 02/19/2018   Personal history of tobacco use, presenting hazards to health 07/11/2016   Aortic atherosclerosis (HCC) 07/09/2016   Osteoporosis 07/07/2016   Myopia of both eyes 06/16/2016   Hyperlipidemia 06/08/2016   CAD  (coronary artery disease) 06/08/2016   Snoring 06/08/2016   Benign hypertensive renal disease 11/14/2015    Past Surgical History:  Procedure Laterality Date   CARDIAC SURGERY  05/09/2021   CAROTID STENT     CATARACT EXTRACTION W/PHACO Right 11/09/2017   Procedure: CATARACT EXTRACTION PHACO AND INTRAOCULAR LENS PLACEMENT (IOC) RIGHT;  Surgeon: Nevada Crane, MD;  Location: [facility];  Service: Ophthalmology;  Laterality: Right;   CATARACT EXTRACTION W/PHACO Left 11/23/2017   Procedure: CATARACT EXTRACTION PHACO AND INTRAOCULAR LENS PLACEMENT (IOC) LEFT;  Surgeon: Nevada Crane, MD;  Location: [facility];  Service: Ophthalmology;  Laterality: Left;   COLONOSCOPY WITH PROPOFOL N/A 10/06/2018   Procedure: COLONOSCOPY WITH PROPOFOL;  Surgeon: Pasty Spillers, MD;  Location: [facility];  Service: Endoscopy;  Laterality: N/A;   ESOPHAGOGASTRODUODENOSCOPY (EGD) WITH PROPOFOL N/A 02/22/2018   Procedure: ESOPHAGOGASTRODUODENOSCOPY (EGD) WITH PROPOFOL;  Surgeon: Pasty Spillers, MD;  Location: [facility];  Service: Endoscopy;  Laterality: N/A;   ESOPHAGOGASTRODUODENOSCOPY (EGD) WITH PROPOFOL N/A 06/08/2018   Procedure: ESOPHAGOGASTRODUODENOSCOPY (EGD) WITH PROPOFOL;  Surgeon: Pasty Spillers, MD;  Location: [facility];  Service: Endoscopy;  Laterality: N/A;   ESOPHAGOGASTRODUODENOSCOPY (EGD) WITH PROPOFOL N/A 10/06/2018   Procedure: ESOPHAGOGASTRODUODENOSCOPY (EGD) WITH PROPOFOL;  Surgeon: Pasty Spillers, MD;  Location: [facility];  Service: Endoscopy;  Laterality: N/A;   INCONTINENCE SURGERY     LEFT HEART CATH AND CORONARY ANGIOGRAPHY N/A 02/06/2021   Procedure: LEFT HEART CATH AND CORONARY ANGIOGRAPHY;  Surgeon: Alwyn Pea, MD;  Location: [facility];  Service: Cardiovascular;  Laterality: N/A;   THYROID SURGERY      OB History   No obstetric history on file.      Home Medications    Prior to Admission  medications   Medication Sig Start Date End Date Taking? Authorizing Provider  alendronate (FOSAMAX) 70 MG tablet TAKE 1 TABLET BY MOUTH EVERY SUNDAY. TAKE WITH A FULL GLASS OF WATER ON AN EMPTY STOMACH. 05/04/22  Yes Johnson, Megan P, DO  aspirin 81 MG EC tablet Take 1 tablet (81 mg total) by mouth daily. TAKE 1 TABLET (81 MG TOTAL) BY MOUTH DAILY. 12/22/21  Yes Johnson, Megan P, DO  atorvastatin (LIPITOR) 80 MG tablet Take 1 tablet (80 mg total) by mouth daily. 12/24/22  Yes Johnson, Megan P, DO  azithromycin (ZITHROMAX Z-PAK) 250 MG tablet Take 1 tablet (250 mg total) by mouth daily. Take 2 tablets on day 1 01/25/23  Yes Sarahelizabeth Conway, DO  clobetasol ointment (TEMOVATE) 0.05 % Apply 1 Application topically daily. 12/24/22  Yes Johnson, Megan P, DO  furosemide (LASIX) 20 MG tablet Take 20 mg by mouth daily. 12/03/21  Yes [provider]  metoprolol tartrate (LOPRESSOR) 25 MG tablet SMARTSIG:1 Tablet(s) By Mouth Every 12 Hours 12/24/22  Yes Johnson, Megan P, DO  mupirocin ointment (BACTROBAN) 2 % Apply 1 Application topically 2 (two) times daily. 12/24/22  Yes Johnson, Megan P, DO  nitroGLYCERIN (NITROSTAT) 0.4 MG SL tablet Place 0.4 mg under the tongue every 5 (five) minutes as needed for chest pain.   Yes [provider]  predniSONE (DELTASONE) 20 MG tablet Take 2 tablets (40 mg total) by mouth daily for 5 days. 01/25/23 01/30/23 Yes Zeniya Lapidus, DO  promethazine-dextromethorphan (PROMETHAZINE-DM) 6.25-15 MG/5ML syrup Take 5 mLs by mouth 4 (four) times daily as needed. 01/25/23  Yes Katha Cabal, DO    Family History Family History  Problem Relation Age of Onset   Liver cancer Brother    Heart Problems Mother    Breast cancer Neg Hx     Social History Social History   Tobacco Use   Smoking status: Former    Packs/day: 1.50    Years: 22.00    Additional pack years: 0.00    Total pack years: 33.00    Types: Cigarettes    Quit date: 05/08/2014    Years since quitting:  8.7   Smokeless tobacco: Never   Tobacco comments:    Smoked over 25 years, quit after Heart Attack in 2015.    Vaping Use   Vaping Use: Never used  Substance Use Topics   Alcohol use: No    Alcohol/week: 0.0 standard drinks of alcohol   Drug use: No     Allergies   Patient has no known allergies.   Review of Systems Review of Systems: negative unless otherwise stated in HPI.      Physical Exam Triage Vital Signs ED Triage Vitals  Enc Vitals Group     BP 01/25/23 1355 (!) 143/72     Pulse Rate 01/25/23 1355 78     Resp 01/25/23 1355 16     Temp 01/25/23 1355 98.9 F (37.2 C)     Temp Source 01/25/23 1355 Oral     SpO2 01/25/23 1355  99 %     Weight 01/25/23 1355 144 lb (65.3 kg)     Height 01/25/23 1355 5\' 3"  (1.6 m)     Head Circumference --      Peak Flow --      Pain Score 01/25/23 1358 0     Pain Loc --      Pain Edu? --      Excl. in GC? --    No data found.  Updated Vital Signs BP (!) 143/72 (BP Location: Left Arm)   Pulse 78   Temp 98.9 F (37.2 C) (Oral)   Resp 16   Ht 5\' 3"  (1.6 m)   Wt 65.3 kg   SpO2 99%   BMI 25.51 kg/m   Visual Acuity Right Eye Distance:   Left Eye Distance:   Bilateral Distance:    Right Eye Near:   Left Eye Near:    Bilateral Near:     Physical Exam GEN:     alert, well appearing female in no distress    HENT:  mucus membranes moist, oropharyngeal without erythema, lesions or exudate, no tonsillar hypertrophy, clear nasal discharge, right cerumen impaction, left TM normal  EYES:   pupils equal and reactive, no scleral injection or discharge NECK:  normal ROM, no lymphadenopathy RESP:  no increased work of breathing,  rales bilaterally  CVS:   regular rate and rhythm Skin:   warm and dry, no rash on visible skin    UC Treatments / Results  Labs (all labs ordered are listed, but only abnormal results are displayed) Labs Reviewed - No data to display  EKG   Radiology DG Chest 2 View  Result Date:  01/25/2023 CLINICAL DATA:  Cough and wheezing. EXAM: CHEST - 2 VIEW COMPARISON:  02/19/2018 FINDINGS: Normal heart size. Interval CABG and clipping of the left atrial appendage. There is no evidence of pulmonary edema, consolidation, pneumothorax, nodule or pleural fluid. Visualized bony structures are unremarkable. IMPRESSION: Interval CABG and clipping of the left atrial appendage. No acute findings. Electronically Signed   By: Irish Lack M.D.   On: 01/25/2023 14:22    Procedures Procedures (including critical care time)   Procedures Ear Cerumen Removal   Date/Time: 09/11/2019 9:08 PM Performed by:  CMA Annice Pih  Authorized by: Katha Cabal, DO   Consent:    Consent obtained:  Verbal   Consent given by:  Patient   Risks discussed:  Bleeding, dizziness, infection, incomplete removal, TM perforation and pain   Alternatives discussed:  No treatment Procedure details:    Location:  right ear   Procedure type: irrigation   Post-procedure details:    Inspection:  TM intact   Hearing quality:  Improved   Patient tolerance of procedure:  Tolerated well, no immediate complications   Medications Ordered in UC Medications - No data to display  Initial Impression / Assessment and Plan / UC Course  I have reviewed the triage vital signs and the nursing notes.  Pertinent labs & imaging results that were available during my care of the patient were reviewed by me and considered in my medical decision making (see chart for details).      Pt is a 72 y.o. female who presents for 5 days of cough that is not improving.  Bernadett is  afebrile here without recent antipyretics. Satting well on room air. Overall pt is  non-toxic appearing, well hydrated, without respiratory distress. Pulmonary exam is remarkable for rales.  After shared decision making, we  will pursue chest x-ray.  Chest xray personally reviewed by me without focal pneumonia, pleural effusion, cardiomegaly or pneumothorax.  Radiologist notes post-surgical changes. COVID  and influenza testing deferred due to length of symptoms.   On chart review, he lung cancer screenings note emphysema. Treat acute emphysematous flare with steroids and antibiotics as below.  Promethazine DM cough syrup given for cough and allow patient to rest.  Typical duration of symptoms discussed. Return and ED precautions given and patient voiced understanding.   Ceruminosis is noted in the right canal.  Wax is removed by syringing debridement. Instructions for home care to prevent wax buildup are given. Right TM on re-inspection is unremarkable.   Discussed MDM, treatment plan and plan for follow-up with patient who agrees with plan.      Final Clinical Impressions(s) / UC Diagnoses   Final diagnoses:  Acute exacerbation of emphysema (HCC)  Impacted cerumen of right ear     Discharge Instructions      Stop by the pharmacy to pick up your prescriptions.  Follow up with your primary care provider as needed.  Stop by the pharmacy to pick up Debrox for earwax removal.        ED Prescriptions     Medication Sig Dispense Auth. Provider   azithromycin (ZITHROMAX Z-PAK) 250 MG tablet Take 1 tablet (250 mg total) by mouth daily. Take 2 tablets on day 1 6 tablet Khyan Oats, DO   predniSONE (DELTASONE) 20 MG tablet Take 2 tablets (40 mg total) by mouth daily for 5 days. 10 tablet Samera Macy, DO   promethazine-dextromethorphan (PROMETHAZINE-DM) 6.25-15 MG/5ML syrup Take 5 mLs by mouth 4 (four) times daily as needed. 118 mL Katha Cabal, DO      PDMP not reviewed this encounter.   Katha Cabal, DO 01/25/23 1455

## 2023-02-10 ENCOUNTER — Ambulatory Visit
Admission: RE | Admit: 2023-02-10 | Discharge: 2023-02-10 | Disposition: A | Discharge status: Home / Self Care | Payer: 59 | Source: Ambulatory Visit | Attending: Family Medicine | Admitting: Family Medicine

## 2023-02-10 DIAGNOSIS — Z780 Asymptomatic menopausal state: Secondary | ICD-10-CM | POA: Insufficient documentation

## 2023-02-10 DIAGNOSIS — Z13820 Encounter for screening for osteoporosis: Secondary | ICD-10-CM | POA: Insufficient documentation

## 2023-02-10 DIAGNOSIS — M810 Age-related osteoporosis without current pathological fracture: Secondary | ICD-10-CM | POA: Diagnosis not present

## 2023-02-10 DIAGNOSIS — M859 Disorder of bone density and structure, unspecified: Secondary | ICD-10-CM | POA: Diagnosis not present

## 2023-02-15 ENCOUNTER — Other Ambulatory Visit: Payer: Self-pay | Admitting: Family Medicine

## 2023-02-15 MED ORDER — ALENDRONATE SODIUM 70 MG PO TABS
ORAL_TABLET | ORAL | 3 refills | Status: DC
Start: 2023-02-15 — End: 2023-12-21

## 2023-03-24 ENCOUNTER — Encounter: Payer: Self-pay | Admitting: Dermatology

## 2023-03-24 ENCOUNTER — Ambulatory Visit (INDEPENDENT_AMBULATORY_CARE_PROVIDER_SITE_OTHER): Payer: 59 | Admitting: Dermatology

## 2023-03-24 VITALS — BP 125/70 | HR 65

## 2023-03-24 DIAGNOSIS — W908XXA Exposure to other nonionizing radiation, initial encounter: Secondary | ICD-10-CM

## 2023-03-24 DIAGNOSIS — L918 Other hypertrophic disorders of the skin: Secondary | ICD-10-CM

## 2023-03-24 DIAGNOSIS — L821 Other seborrheic keratosis: Secondary | ICD-10-CM | POA: Diagnosis not present

## 2023-03-24 DIAGNOSIS — L729 Follicular cyst of the skin and subcutaneous tissue, unspecified: Secondary | ICD-10-CM

## 2023-03-24 DIAGNOSIS — L814 Other melanin hyperpigmentation: Secondary | ICD-10-CM

## 2023-03-24 DIAGNOSIS — D1801 Hemangioma of skin and subcutaneous tissue: Secondary | ICD-10-CM

## 2023-03-24 DIAGNOSIS — Z1283 Encounter for screening for malignant neoplasm of skin: Secondary | ICD-10-CM

## 2023-03-24 DIAGNOSIS — L570 Actinic keratosis: Secondary | ICD-10-CM

## 2023-03-24 DIAGNOSIS — D229 Melanocytic nevi, unspecified: Secondary | ICD-10-CM

## 2023-03-24 DIAGNOSIS — L720 Epidermal cyst: Secondary | ICD-10-CM

## 2023-03-24 DIAGNOSIS — L578 Other skin changes due to chronic exposure to nonionizing radiation: Secondary | ICD-10-CM

## 2023-03-24 DIAGNOSIS — L820 Inflamed seborrheic keratosis: Secondary | ICD-10-CM | POA: Diagnosis not present

## 2023-03-24 NOTE — Progress Notes (Signed)
New Patient Visit   Subjective  Tonya Cabrera is a 71 y.o. female who presents for the following: Skin Cancer Screening and Full Body Skin Exam. C/O rough dark spots popping up all over. No Hx of skin cancer or dysplastic nevi.   States had lesion at vaginal labia. Has drainage at times. Dur: 2 years. Flares and improves, never goes away.  The patient presents for Total-Body Skin Exam (TBSE) for skin cancer screening and mole check. The patient has spots, moles and lesions to be evaluated, some may be new or changing and the patient has concerns that these could be cancer.    The following portions of the chart were reviewed this encounter and updated as appropriate: medications, allergies, medical history  Review of Systems:  No other skin or systemic complaints except as noted in HPI or Assessment and Plan.  Objective  Well appearing patient in no apparent distress; mood and affect are within normal limits.  A full examination was performed including scalp, head, eyes, ears, nose, lips, neck, chest, axillae, abdomen, back, buttocks, bilateral upper extremities, bilateral lower extremities, hands, feet, fingers, toes, fingernails, and toenails. All findings within normal limits unless otherwise noted below.   Relevant physical exam findings are noted in the Assessment and Plan.  left medial cheek x1, chest x4, left forehead x1 (6) Erythematous thin papules/macules with gritty scale.   left post thigh x1, chest x2, left cheek x2 (5) Erythematous keratotic or waxy stuck-on papule or plaque.    Assessment & Plan   LENTIGINES, SEBORRHEIC KERATOSES, HEMANGIOMAS - Benign normal skin lesions - Benign-appearing - Call for any changes  MELANOCYTIC NEVI - Tan-brown and/or pink-flesh-colored symmetric macules and papules - Benign appearing on exam today - Observation - Call clinic for new or changing moles - Recommend daily use of broad spectrum spf 30+ sunscreen to sun-exposed  areas.   ACTINIC DAMAGE - Chronic condition, secondary to cumulative UV/sun exposure - diffuse scaly erythematous macules with underlying dyspigmentation - Recommend daily broad spectrum sunscreen SPF 30+ to sun-exposed areas, reapply every 2 hours as needed.  - Staying in the shade or wearing long sleeves, sun glasses (UVA+UVB protection) and wide brim hats (4-inch brim around the entire circumference of the hat) are also recommended for sun protection.  - Call for new or changing lesions.  SKIN CANCER SCREENING PERFORMED TODAY.  Acrochordons (Skin Tags) - Fleshy, skin-colored pedunculated papules - Benign appearing.  - Observe. - If desired, they can be removed with an in office procedure that is not covered by insurance. - Please call the clinic if you notice any new or changing lesions.   AK (actinic keratosis) (6) left medial cheek x1, chest x4, left forehead x1  Actinic keratoses are precancerous spots that appear secondary to cumulative UV radiation exposure/sun exposure over time. They are chronic with expected duration over 1 year. A portion of actinic keratoses will progress to squamous cell carcinoma of the skin. It is not possible to reliably predict which spots will progress to skin cancer and so treatment is recommended to prevent development of skin cancer.  Recommend daily broad spectrum sunscreen SPF 30+ to sun-exposed areas, reapply every 2 hours as needed.  Recommend staying in the shade or wearing long sleeves, sun glasses (UVA+UVB protection) and wide brim hats (4-inch brim around the entire circumference of the hat). Call for new or changing lesions.  Destruction of lesion - left medial cheek x1, chest x4, left forehead x1 Complexity: simple  Destruction method: cryotherapy   Informed consent: discussed and consent obtained   Timeout:  patient name, date of birth, surgical site, and procedure verified Lesion destroyed using liquid nitrogen: Yes   Region frozen  until ice ball extended beyond lesion: Yes   Outcome: patient tolerated procedure well with no complications   Post-procedure details: wound care instructions given   Additional details:  Prior to procedure, discussed risks of blister formation, small wound, skin dyspigmentation, or rare scar following cryotherapy. Recommend Vaseline ointment to treated areas while healing.   Inflamed seborrheic keratosis (5) left post thigh x1, chest x2, left cheek x2  Symptomatic, irritating, patient would like treated.  Destruction of lesion - left post thigh x1, chest x2, left cheek x2 Complexity: simple   Destruction method: cryotherapy   Informed consent: discussed and consent obtained   Timeout:  patient name, date of birth, surgical site, and procedure verified Lesion destroyed using liquid nitrogen: Yes   Region frozen until ice ball extended beyond lesion: Yes   Outcome: patient tolerated procedure well with no complications   Post-procedure details: wound care instructions given   Additional details:  Prior to procedure, discussed risks of blister formation, small wound, skin dyspigmentation, or rare scar following cryotherapy. Recommend Vaseline ointment to treated areas while healing.    EPIDERMAL INCLUSION CYST Exam: Subcutaneous nodule at right labia, ~0.7 cm   Benign-appearing. Exam most consistent with an epidermal inclusion cyst. Discussed that a cyst is a benign growth that can grow over time and sometimes get irritated or inflamed. Recommend observation if it is not bothersome. Discussed option of surgical excision to remove it if it is growing, symptomatic, or other changes noted. Please call for new or changing lesions so they can be evaluated.     Return in about 1 year (around 03/23/2024) for TBSE.  I, Lawson Radar, CMA, am acting as scribe for Armida Sans, MD.   Documentation: I have reviewed the above documentation for accuracy and completeness, and I agree with the  above.  Armida Sans, MD

## 2023-03-24 NOTE — Patient Instructions (Addendum)
Cryotherapy Aftercare  Wash gently with soap and water everyday.   Apply Vaseline Jelly daily until healed.    Recommend daily broad spectrum sunscreen SPF 30+ to sun-exposed areas, reapply every 2 hours as needed. Call for new or changing lesions.  Staying in the shade or wearing long sleeves, sun glasses (UVA+UVB protection) and wide brim hats (4-inch brim around the entire circumference of the hat) are also recommended for sun protection.    Melanoma ABCDEs  Melanoma is the most dangerous type of skin cancer, and is the leading cause of death from skin disease.  You are more likely to develop melanoma if you: Have light-colored skin, light-colored eyes, or red or blond hair Spend a lot of time in the sun Tan regularly, either outdoors or in a tanning bed Have had blistering sunburns, especially during childhood Have a close family member who has had a melanoma Have atypical moles or large birthmarks  Early detection of melanoma is key since treatment is typically straightforward and cure rates are extremely high if we catch it early.   The first sign of melanoma is often a change in a mole or a new dark spot.  The ABCDE system is a way of remembering the signs of melanoma.  A for asymmetry:  The two halves do not match. B for border:  The edges of the growth are irregular. C for color:  A mixture of colors are present instead of an even brown color. D for diameter:  Melanomas are usually (but not always) greater than 6mm - the size of a pencil eraser. E for evolution:  The spot keeps changing in size, shape, and color.  Please check your skin once per month between visits. You can use a small mirror in front and a large mirror behind you to keep an eye on the back side or your body.   If you see any new or changing lesions before your next follow-up, please call to schedule a visit.  Please continue daily skin protection including broad spectrum sunscreen SPF 30+ to sun-exposed  areas, reapplying every 2 hours as needed when you're outdoors.   Staying in the shade or wearing long sleeves, sun glasses (UVA+UVB protection) and wide brim hats (4-inch brim around the entire circumference of the hat) are also recommended for sun protection.    Due to recent changes in healthcare laws, you may see results of your pathology and/or laboratory studies on MyChart before the doctors have had a chance to review them. We understand that in some cases there may be results that are confusing or concerning to you. Please understand that not all results are received at the same time and often the doctors may need to interpret multiple results in order to provide you with the best plan of care or course of treatment. Therefore, we ask that you please give us 2 business days to thoroughly review all your results before contacting the office for clarification. Should we see a critical lab result, you will be contacted sooner.   If You Need Anything After Your Visit  If you have any questions or concerns for your doctor, please call our main line at [phone] and press option 4 to reach your doctor's medical assistant. If no one answers, please leave a voicemail as directed and we will return your call as soon as possible. Messages left after 4 pm will be answered the following business day.   You may also send us a message via MyChart.   We typically respond to MyChart messages within 1-2 business days.  For prescription refills, please ask your pharmacy to contact our office. Our fax number is [phone].  If you have an urgent issue when the clinic is closed that cannot wait until the next business day, you can page your doctor at the number below.    Please note that while we do our best to be available for urgent issues outside of office hours, we are not available 24/7.   If you have an urgent issue and are unable to reach us, you may choose to seek medical care at your doctor's  office, retail clinic, urgent care center, or emergency room.  If you have a medical emergency, please immediately call 911 or go to the emergency department.  Pager Numbers  - Dr. Kowalski: [phone]  - Dr. Moye: [phone]  - Dr. Stewart: [phone]  In the event of inclement weather, please call our main line at [phone] for an update on the status of any delays or closures.  Dermatology Medication Tips: Please keep the boxes that topical medications come in in order to help keep track of the instructions about where and how to use these. Pharmacies typically print the medication instructions only on the boxes and not directly on the medication tubes.   If your medication is too expensive, please contact our office at [phone] option 4 or send us a message through MyChart.   We are unable to tell what your co-pay for medications will be in advance as this is different depending on your insurance coverage. However, we may be able to find a substitute medication at lower cost or fill out paperwork to get insurance to cover a needed medication.   If a prior authorization is required to get your medication covered by your insurance company, please allow us 1-2 business days to complete this process.  Drug prices often vary depending on where the prescription is filled and some pharmacies may offer cheaper prices.  The website www.goodrx.com contains coupons for medications through different pharmacies. The prices here do not account for what the cost may be with help from insurance (it may be cheaper with your insurance), but the website can give you the price if you did not use any insurance.  - You can print the associated coupon and take it with your prescription to the pharmacy.  - You may also stop by our office during regular business hours and pick up a GoodRx coupon card.  - If you need your prescription sent electronically to a different pharmacy, notify our office  through [facility] MyChart or by phone at [phone] option 4.     Si Usted Necesita Algo Despus de Su Visita  Tambin puede enviarnos un mensaje a travs de MyChart. Por lo general respondemos a los mensajes de MyChart en el transcurso de 1 a 2 das hbiles.  Para renovar recetas, por favor pida a su farmacia que se ponga en contacto con nuestra oficina. Nuestro nmero de fax es el [phone].  Si tiene un asunto urgente cuando la clnica est cerrada y que no puede esperar hasta el siguiente da hbil, puede llamar/localizar a su doctor(a) al nmero que aparece a continuacin.   Por favor, tenga en cuenta que aunque hacemos todo lo posible para estar disponibles para asuntos urgentes fuera del horario de oficina, no estamos disponibles las 24 horas del da, los 7 das de la semana.   Si tiene un problema urgente y no puede   comunicarse con nosotros, puede optar por buscar atencin mdica  en el consultorio de su doctor(a), en una clnica privada, en un centro de atencin urgente o en una sala de emergencias.  Si tiene una emergencia mdica, por favor llame inmediatamente al 911 o vaya a la sala de emergencias.  Nmeros de bper  - Dr. Kowalski: [phone]  - Dra. Moye: [phone]  - Dra. Stewart: [phone]  En caso de inclemencias del tiempo, por favor llame a nuestra lnea principal al [phone] para una actualizacin sobre el estado de cualquier retraso o cierre.  Consejos para la medicacin en dermatologa: Por favor, guarde las cajas en las que vienen los medicamentos de uso tpico para ayudarle a seguir las instrucciones sobre dnde y cmo usarlos. Las farmacias generalmente imprimen las instrucciones del medicamento slo en las cajas y no directamente en los tubos del medicamento.   Si su medicamento es muy caro, por favor, pngase en contacto con nuestra oficina llamando al [phone] y presione la opcin 4 o envenos un mensaje a travs de MyChart.   No  podemos decirle cul ser su copago por los medicamentos por adelantado ya que esto es diferente dependiendo de la cobertura de su seguro. Sin embargo, es posible que podamos encontrar un medicamento sustituto a menor costo o llenar un formulario para que el seguro cubra el medicamento que se considera necesario.   Si se requiere una autorizacin previa para que su compaa de seguros cubra su medicamento, por favor permtanos de 1 a 2 das hbiles para completar este proceso.  Los precios de los medicamentos varan con frecuencia dependiendo del lugar de dnde se surte la receta y alguna farmacias pueden ofrecer precios ms baratos.  El sitio web www.goodrx.com tiene cupones para medicamentos de diferentes farmacias. Los precios aqu no tienen en cuenta lo que podra costar con la ayuda del seguro (puede ser ms barato con su seguro), pero el sitio web puede darle el precio si no utiliz ningn seguro.  - Puede imprimir el cupn correspondiente y llevarlo con su receta a la farmacia.  - Tambin puede pasar por nuestra oficina durante el horario de atencin regular y recoger una tarjeta de cupones de GoodRx.  - Si necesita que su receta se enve electrnicamente a una farmacia diferente, informe a nuestra oficina a travs de MyChart de [place] o por telfono llamando al [phone] y presione la opcin 4.  

## 2023-03-26 ENCOUNTER — Encounter: Payer: Self-pay | Admitting: Dermatology

## 2023-05-23 ENCOUNTER — Other Ambulatory Visit: Payer: Self-pay | Admitting: Family Medicine

## 2023-05-25 NOTE — Telephone Encounter (Signed)
Requested Prescriptions  Pending Prescriptions Disp Refills   metoprolol tartrate (LOPRESSOR) 25 MG tablet [Pharmacy Med Name: METOPROLOL TARTRATE 25 MG TAB] 180 tablet 0    Sig: TAKE 1 TABLET BY MOUTH EVERY 12 HOURS     Cardiovascular:  Beta Blockers Passed - 05/23/2023  3:25 PM      Passed - Last BP in normal range    BP Readings from Last 1 Encounters:  03/24/23 125/70         Passed - Last Heart Rate in normal range    Pulse Readings from Last 1 Encounters:  03/24/23 65         Passed - Valid encounter within last 6 months    Recent Outpatient Visits           4 months ago Benign hypertensive renal disease   [place] [facility] [place], Megan P, DO   5 months ago Benign hypertensive renal disease   [place] [facility] [place], Megan P, DO   11 months ago Encounter for annual wellness visit (AWV) in Medicare patient   [place] [place], [place], DO   1 year ago Benign hypertensive renal disease   [place] [facility] [place], Megan P, DO   1 year ago Routine general medical examination at a health care facility   [facility] Carrow, DO       Future Appointments             In 1 month Dorcas Carrow, DO [place] [facility]   In 10 months Deirdre Evener, MD [place] [facility]             clobetasol ointment (TEMOVATE) 0.05 % [Pharmacy Med Name: CLOBETASOL 0.05% OINTMENT] 30 g 0    Sig: APPLY 1 APPLICATION TOPICALLY DAILY     Not Delegated - Dermatology:  Corticosteroids Failed - 05/23/2023  3:25 PM      Failed - This refill cannot be delegated      Passed - Valid encounter within last 12 months    Recent Outpatient Visits           4 months ago Benign hypertensive renal disease   [place] [facility] [place], Megan P, DO   5 months ago Benign hypertensive renal disease   [place] [facility] [place], Megan P, DO   11 months ago Encounter for annual wellness visit (AWV) in Medicare patient   [place] [facility] [place], [place], DO   1 year ago Benign hypertensive renal disease   [place] [place], Megan P, DO   1 year ago Routine general medical examination at a health care facility    [facility] Dorcas C[facility], DO       Future Appointments             In 1 month Dorcas Carrow, DO [place] [facility]   In 10 months Deirdre Evener, MD [place] [facility]            Signed Prescriptions Disp Refills   mupirocin ointment (BACTROBAN) 2 % 22 g 0    Sig: APPLY TO AFFECTED AREA TWICE A DAY     Off-Protocol Failed - 05/23/2023  3:25 PM      Failed - Medication not assigned to a protocol, review  manually.      Passed - Valid encounter within last 12 months    Recent Outpatient Visits           4 months ago Benign hypertensive renal disease   [place] [facility] [place], Megan P, DO   5 months ago Benign hypertensive renal disease   [place] [facility] [place], Megan P, DO   11 months ago Encounter for annual wellness visit (AWV) in Medicare patient   [place] [facility] [place], [place], DO   1 year ago Benign hypertensive renal disease   [place] [facility] C[facility], DO   1 year ago Routine general medical examination at a health care facility   [facility] Dorcas Carrow, DO       Future Appointments             In 1 month Dorcas Carrow, DO  [facility]   In 10 months Deirdre Evener, MD [facility]

## 2023-05-25 NOTE — Telephone Encounter (Signed)
Requested Prescriptions  Pending Prescriptions Disp Refills   mupirocin ointment (BACTROBAN) 2 % [Pharmacy Med Name: MUPIROCIN 2% OINTMENT] 22 g 0    Sig: APPLY TO AFFECTED AREA TWICE A DAY     Off-Protocol Failed - 05/23/2023  3:25 PM      Failed - Medication not assigned to a protocol, review manually.      Passed - Valid encounter within last 12 months    Recent Outpatient Visits           4 months ago Benign hypertensive renal disease   [place] [place], Megan P, DO   5 months ago Benign hypertensive renal disease   [place] [facility] [place], Megan P, DO   11 months ago Encounter for annual wellness visit (AWV) in Medicare patient   [place] [facility] [place], [place], DO   1 year ago Benign hypertensive renal disease   [place] [facility] C[facility], DO   1 year ago Routine general medical examination at a health care facility   [facility], DO       Future Appointments             In 1 month Dorcas Carrow, DO [place] [facility]   In 10 months Deirdre Evener, MD [place] [facility]             metoprolol tartrate (LOPRESSOR) 25 MG tablet [Pharmacy Med Name: METOPROLOL TARTRATE 25 MG TAB] 180 tablet 1    Sig: TAKE 1 TABLET BY MOUTH EVERY 12 HOURS     Cardiovascular:  Beta Blockers Passed - 05/23/2023  3:25 PM      Passed - Last BP in normal range    BP Readings from Last 1 Encounters:  03/24/23 125/70         Passed - Last Heart Rate in normal range    Pulse Readings from Last 1 Encounters:  03/24/23 65         Passed - Valid encounter within last 6 months    Recent Outpatient Visits           4 months ago Benign hypertensive renal disease   [place] [facility] [place], Megan P, DO   5 months ago Benign hypertensive renal disease   [place] [facility], [facility] P, DO   11 months ago Encounter for annual wellness visit (AWV) in Medicare patient   [place] [facility] [place], [place], DO   1 year ago Benign hypertensive renal disease   [place] [facility] [place], Megan P, DO   1 year ago Routine general medical examination at a health care facility   [facility] Dor[facility] C[facility], DO       Future Appointments             In 1 month Dorcas Carrow, DO [place] [facility]   In 10 months Deirdre Evener, MD [place]  [facility]             clobetasol ointment (TEMOVATE) 0.05 % [Pharmacy Med Name: CLOBETASOL 0.05% OINTMENT] 30 g 0    Sig: APPLY 1 APPLICATION TOPICALLY DAILY     Not Delegated - Dermatology:  Corticosteroids Failed - 05/23/2023  3:25 PM      Failed - This refill cannot  be delegated      Passed - Valid encounter within last 12 months    Recent Outpatient Visits           4 months ago Benign hypertensive renal disease   [facility] [place], Megan P, DO   5 months ago Benign hypertensive renal disease   [place] [facility] [place], Megan P, DO   11 months ago Encounter for annual wellness visit (AWV) in Medicare patient   [place] [facility] [place], [place], DO   1 year ago Benign hypertensive renal disease   [place] [facility] Dor[facility] C[facility], DO   1 year ago Routine general medical examination at a health care facility   [facility] C[facility], DO       Future Appointments             In 1 month Dorcas Carrow, DO [place] [facility]   In 10 months Deirdre Evener, MD [facility]

## 2023-05-25 NOTE — Telephone Encounter (Signed)
Requested medication (s) are due for refill today: yes  Requested medication (s) are on the active medication list: yes    Last refill: 12/24/22  30g  0 refills  Future visit scheduled yes 06/28/23  Notes to clinic:Not delegated, please review. Thank you.  Requested Prescriptions  Pending Prescriptions Disp Refills   clobetasol ointment (TEMOVATE) 0.05 % [Pharmacy Med Name: CLOBETASOL 0.05% OINTMENT] 30 g 0    Sig: APPLY 1 APPLICATION TOPICALLY DAILY     Not Delegated - Dermatology:  Corticosteroids Failed - 05/23/2023  3:25 PM      Failed - This refill cannot be delegated      Passed - Valid encounter within last 12 months    Recent Outpatient Visits           4 months ago Benign hypertensive renal disease   [place] [facility] [place], [facility] P, DO   5 months ago Benign hypertensive renal disease   [place] [facility] [place], Megan P, DO   11 months ago Encounter for annual wellness visit (AWV) in Medicare patient   [place] [facility] [place], [place], DO   1 year ago Benign hypertensive renal disease   [facility] [place], Megan P, DO   1 year ago Routine general medical examination at a health care facility   [facility] C[facility], DO       Future Appointments             In 1 month Dorcas Carrow, DO [facility]   In 10 months Deirdre Evener, MD [place] [facility]            Signed Prescriptions Disp Refills   mupirocin ointment (BACTROBAN) 2 % 22 g 0    Sig: APPLY TO AFFECTED AREA TWICE A DAY     Off-Protocol Failed - 05/23/2023  3:25 PM      Failed - Medication not assigned to a protocol, review manually.      Passed - Valid encounter within last 12 months    Recent Outpatient Visits           4 months ago Benign hypertensive renal disease   [place] [facility] [place], Megan P, DO    5 months ago Benign hypertensive renal disease   [place] [facility] [place], Megan P, DO   11 months ago Encounter for annual wellness visit (AWV) in Medicare patient   [place] [facility] [place], [place], DO   1 year ago Benign hypertensive renal disease   [place] [facility] C[facility], DO   1 year ago Routine general medical examination at a health care facility   [facility] C[facility], DO       Future Appointments             In 1 month Dorcas Carrow, DO [place] [facility]   In 10 months Deirdre Evener, MD  [facility]             metoprolol tartrate (LOPRESSOR) 25 MG tablet 180 tablet 0    Sig: TAKE 1 TABLET BY MOUTH EVERY 12 HOURS     Cardiovascular:  Beta Blockers Passed - 05/23/2023  3:25 PM      Passed - Last BP in normal range  BP Readings from Last 1 Encounters:  03/24/23 125/70         Passed - Last Heart Rate in normal range    Pulse Readings from Last 1 Encounters:  03/24/23 65         Passed - Valid encounter within last 6 months    Recent Outpatient Visits           4 months ago Benign hypertensive renal disease   [place] [facility] [place] [place], Megan P, DO   5 months ago Benign hypertensive renal disease   [place] [facility] [facility] [place], Megan P, DO   11 months ago Encounter for annual wellness visit (AWV) in Medicare patient   [place] [facility] [place], [place], DO   1 year ago Benign hypertensive renal disease   [place] [facility] C[facility], DO   1 year ago Routine general medical examination at a health care facility   [facility] C[facility], DO       Future Appointments             In 1 month Dorcas Carrow, DO  [facility]   In 10 months Deirdre Evener, MD [facility]

## 2023-06-28 ENCOUNTER — Encounter: Payer: Self-pay | Admitting: Family Medicine

## 2023-06-28 ENCOUNTER — Ambulatory Visit (INDEPENDENT_AMBULATORY_CARE_PROVIDER_SITE_OTHER): Payer: 59 | Admitting: Family Medicine

## 2023-06-28 VITALS — BP 104/64 | HR 62 | Temp 97.7°F | Ht 64.0 in | Wt 142.8 lb

## 2023-06-28 DIAGNOSIS — E782 Mixed hyperlipidemia: Secondary | ICD-10-CM

## 2023-06-28 DIAGNOSIS — D692 Other nonthrombocytopenic purpura: Secondary | ICD-10-CM

## 2023-06-28 DIAGNOSIS — E785 Hyperlipidemia, unspecified: Secondary | ICD-10-CM

## 2023-06-28 DIAGNOSIS — I129 Hypertensive chronic kidney disease with stage 1 through stage 4 chronic kidney disease, or unspecified chronic kidney disease: Secondary | ICD-10-CM | POA: Diagnosis not present

## 2023-06-28 DIAGNOSIS — N3281 Overactive bladder: Secondary | ICD-10-CM

## 2023-06-28 DIAGNOSIS — F3340 Major depressive disorder, recurrent, in remission, unspecified: Secondary | ICD-10-CM | POA: Diagnosis not present

## 2023-06-28 DIAGNOSIS — I25119 Atherosclerotic heart disease of native coronary artery with unspecified angina pectoris: Secondary | ICD-10-CM | POA: Diagnosis not present

## 2023-06-28 DIAGNOSIS — I70213 Atherosclerosis of native arteries of extremities with intermittent claudication, bilateral legs: Secondary | ICD-10-CM

## 2023-06-28 DIAGNOSIS — Z0000 Encounter for general adult medical examination without abnormal findings: Secondary | ICD-10-CM

## 2023-06-28 DIAGNOSIS — Z1231 Encounter for screening mammogram for malignant neoplasm of breast: Secondary | ICD-10-CM

## 2023-06-28 DIAGNOSIS — I700 Atherosclerosis of aorta: Secondary | ICD-10-CM | POA: Diagnosis not present

## 2023-06-28 LAB — MICROSCOPIC EXAMINATION: Bacteria, UA: NONE SEEN

## 2023-06-28 LAB — URINALYSIS, ROUTINE W REFLEX MICROSCOPIC
Bilirubin, UA: NEGATIVE
Glucose, UA: NEGATIVE
Ketones, UA: NEGATIVE
Leukocytes,UA: NEGATIVE
Nitrite, UA: NEGATIVE
Protein,UA: NEGATIVE
Specific Gravity, UA: 1.01 (ref 1.005–1.030)
Urobilinogen, Ur: 0.2 mg/dL (ref 0.2–1.0)
pH, UA: 6 (ref 5.0–7.5)

## 2023-06-28 LAB — MICROALBUMIN, URINE WAIVED
Creatinine, Urine Waived: 50 mg/dL (ref 10–300)
Microalb, Ur Waived: 80 mg/L — ABNORMAL HIGH (ref 0–19)
Microalb/Creat Ratio: >300 mg/g — ABNORMAL HIGH (ref <30)

## 2023-06-28 MED ORDER — ASPIRIN 81 MG PO TBEC: 81.0000 mg | DELAYED_RELEASE_TABLET | Freq: Every day | ORAL | 3 refills | Status: AC

## 2023-06-28 MED ORDER — ATORVASTATIN CALCIUM 80 MG PO TABS: 80.0000 mg | ORAL_TABLET | Freq: Every day | ORAL | 1 refills | Status: DC

## 2023-06-28 MED ORDER — METOPROLOL TARTRATE 25 MG PO TABS: ORAL_TABLET | ORAL | 1 refills | Status: DC

## 2023-06-28 NOTE — Assessment & Plan Note (Signed)
Will keep BP and cholesterol under good control. Continue to monitor. Labs drawn today.  ?

## 2023-06-28 NOTE — Assessment & Plan Note (Signed)
Will check urine today. Await results.

## 2023-06-28 NOTE — Progress Notes (Signed)
BP 104/64   Pulse 62   Temp 97.7 F (36.5 C) (Oral)   Ht 5\' 4"  (1.626 m)   Wt 142 lb 12.8 oz (64.8 kg)   SpO2 100%   BMI 24.51 kg/m    Subjective:    Patient ID: Tonya Cabrera, female    DOB: 26-Jun-1951, 72 y.o.   MRN: [id number]  HPI: Tonya Cabrera is a 72 y.o. female presenting on 06/28/2023 for comprehensive medical examination. Current medical complaints include:  HYPERTENSION / HYPERLIPIDEMIA Satisfied with current treatment? yes Duration of hypertension: chronic BP monitoring frequency: rarely BP medication side effects: no Past BP meds: lasix, metoprolol Duration of hyperlipidemia: chronic Cholesterol medication side effects: no Cholesterol supplements: none Past cholesterol medications: atorvastatin Medication compliance: excellent compliance Aspirin: no Recent stressors: no Recurrent headaches: no Visual changes: no Palpitations: no Dyspnea: no Chest pain: no Lower extremity edema: no Dizzy/lightheaded: no  Menopausal Symptoms: no  Functional Status Survey: Is the patient deaf or have difficulty hearing?: Yes Does the patient have difficulty seeing, even when wearing glasses/contacts?: Yes Does the patient have difficulty concentrating, remembering, or making decisions?: Yes Does the patient have difficulty walking or climbing stairs?: No Does the patient have difficulty dressing or bathing?: No Does the patient have difficulty doing errands alone such as visiting a doctor's office or shopping?: No     06/28/2023    1:55 PM 01/21/2023    2:15 PM 12/24/2022    2:04 PM 06/25/2022    1:15 PM 06/21/2022    9:15 AM  Fall Risk   Falls in the past year? 0 0 0 0 0  Number falls in past yr: 0 0 0 0   Injury with Fall? 0 0 0 0   Risk for fall due to : No Fall Risks No Fall Risks No Fall Risks No Fall Risks   Follow up Falls evaluation completed Falls evaluation completed Falls evaluation completed Falls evaluation completed     Depression Screen     06/28/2023    1:55 PM 01/21/2023    2:15 PM 12/24/2022    2:04 PM 06/25/2022    1:16 PM 12/22/2021   11:10 AM  Depression screen PHQ 2/9  Decreased Interest 0 0 0 0 0  Down, Depressed, Hopeless 0 0 0 0 0  PHQ - 2 Score 0 0 0 0 0  Altered sleeping 3 3 3 3 3   Tired, decreased energy 0 3 0 0 0  Change in appetite 0 0 0 0 0  Feeling bad or failure about yourself  0 0 0 0 0  Trouble concentrating 0 0 0 0 0  Moving slowly or fidgety/restless 0 0 0 0 0  Suicidal thoughts 0 0 0 0 0  PHQ-9 Score 3 6 3 3 3   Difficult doing work/chores Somewhat difficult Not difficult at all Not difficult at all Not difficult at all     Advanced Directives Does patient have a HCPOA?    no If yes, name and contact information:  Does patient have a living will or MOST form?  no  Past Medical History:  Past Medical History:  Diagnosis Date   Acute gastric ulcer without hemorrhage or perforation    Acute osteomyelitis of hand (HCC) 03/01/2015   Acute posthemorrhagic anemia    AKI (acute kidney injury) (HCC) 02/28/2015   Arthritis    Benign neoplasm of cecum    Coronary artery disease    coronary stent   Dyspnea  Gastritis, Helicobacter pylori    Hard of hearing    Heart murmur    Hypercholesteremia    Hypertension    Myocardial infarction [facility])    2015   Osteoporosis    Peptic ulcer    Special screening for malignant neoplasms, colon    Stomach irritation    Stroke (HCC)    10-12 years ago    Surgical History:  Past Surgical History:  Procedure Laterality Date   CARDIAC SURGERY  05/09/2021   CAROTID STENT     CATARACT EXTRACTION W/PHACO Right 11/09/2017   Procedure: CATARACT EXTRACTION PHACO AND INTRAOCULAR LENS PLACEMENT (IOC) RIGHT;  Surgeon: Nevada Crane, MD;  [facility];  Service: Ophthalmology;  Laterality: Right;   CATARACT EXTRACTION W/PHACO Left 11/23/2017   Procedure: CATARACT EXTRACTION PHACO AND INTRAOCULAR LENS PLACEMENT (IOC) LEFT;  Surgeon: Nevada Crane, MD;  Location: [facility];  Service: Ophthalmology;  Laterality: Left;   COLONOSCOPY WITH PROPOFOL N/A 10/06/2018   Procedure: COLONOSCOPY WITH PROPOFOL;  Surgeon: Pasty Spillers, MD;  Location: [facility];  Service: Endoscopy;  Laterality: N/A;   ESOPHAGOGASTRODUODENOSCOPY (EGD) WITH PROPOFOL N/A 02/22/2018   Procedure: ESOPHAGOGASTRODUODENOSCOPY (EGD) WITH PROPOFOL;  Surgeon: Pasty Spillers, MD;  Location: [facility];  Service: Endoscopy;  Laterality: N/A;   ESOPHAGOGASTRODUODENOSCOPY (EGD) WITH PROPOFOL N/A 06/08/2018   Procedure: ESOPHAGOGASTRODUODENOSCOPY (EGD) WITH PROPOFOL;  Surgeon: Pasty Spillers, MD;  Location: [facility];  Service: Endoscopy;  Laterality: N/A;   ESOPHAGOGASTRODUODENOSCOPY (EGD) WITH PROPOFOL N/A 10/06/2018   Procedure: ESOPHAGOGASTRODUODENOSCOPY (EGD) WITH PROPOFOL;  Surgeon: Pasty Spillers, MD;  Location: [facility];  Service: Endoscopy;  Laterality: N/A;   INCONTINENCE SURGERY     LEFT HEART CATH AND CORONARY ANGIOGRAPHY N/A 02/06/2021   Procedure: LEFT HEART CATH AND CORONARY ANGIOGRAPHY;  Surgeon: Alwyn Pea, MD;  Location: [facility];  Service: Cardiovascular;  Laterality: N/A;   THYROID SURGERY      Medications:  Current Outpatient Medications on File Prior to Visit  Medication Sig   alendronate (FOSAMAX) 70 MG tablet TAKE 1 TABLET BY MOUTH EVERY SUNDAY. TAKE WITH A FULL GLASS OF WATER ON AN EMPTY STOMACH.   clobetasol ointment (TEMOVATE) 0.05 % APPLY 1 APPLICATION TOPICALLY DAILY   furosemide (LASIX) 20 MG tablet Take 20 mg by mouth daily.   nitroGLYCERIN (NITROSTAT) 0.4 MG SL tablet Place 0.4 mg under the tongue every 5 (five) minutes as needed for chest pain.   No current facility-administered medications on file prior to visit.    Allergies:  No Known Allergies  Social History:  Social History   Socioeconomic History   Marital status: Divorced    Spouse name: Not on file    Number of children: Not on file   Years of education: Not on file   Highest education level: Not on file  Occupational History   Occupation: retired   Occupation: part Scientist, research (medical)  Tobacco Use   Smoking status: Former    Current packs/day: 0.00    Average packs/day: 1.5 packs/day for 22.0 years (33.0 ttl pk-yrs)    Types: Cigarettes    Start date: 05/08/1992    Quit date: 05/08/2014    Years since quitting: 9.1   Smokeless tobacco: Never   Tobacco comments:    Smoked over 25 years, quit after Heart Attack in 2015.    Vaping Use   Vaping status: Never Used  Substance and Sexual Activity   Alcohol use: No  Alcohol/week: 0.0 standard drinks of alcohol   Drug use: No   Sexual activity: Not Currently  Other Topics Concern   Not on file  Social History Narrative   Not on file   Social Determinants of Health   Financial Resource Strain: Low Risk  (06/17/2021)   Overall Financial Resource Strain (CARDIA)    Difficulty of Paying Living Expenses: Not very hard  Food Insecurity: No Food Insecurity (06/17/2021)   Hunger Vital Sign    Worried About Running Out of Food in the Last Year: Never true    Ran Out of Food in the Last Year: Never true  Transportation Needs: No Transportation Needs (06/17/2021)   PRAPARE - Administrator, Civil Service (Medical): No    Lack of Transportation (Non-Medical): No  Physical Activity: Sufficiently Active (06/17/2021)   Exercise Vital Sign    Days of Exercise per Week: 7 days    Minutes of Exercise per Session: 60 min  Stress: Stress Concern Present (06/17/2021)   [employer] of Occupational Health - Occupational Stress Questionnaire    Feeling of Stress : Rather much  Social Connections: Socially Isolated (06/17/2021)   Social Connection and Isolation Panel [NHANES]    Frequency of Communication with Friends and Family: More than three times a week    Frequency of Social Gatherings with Friends and Family: Three times a week     Attends Religious Services: Never    Active Member of Clubs or Organizations: No    Attends Banker Meetings: Never    Marital Status: Divorced  Catering manager Violence: Not At Risk (06/17/2021)   Humiliation, Afraid, Rape, and Kick questionnaire    Fear of Current or Ex-Partner: No    Emotionally Abused: No    Physically Abused: No    Sexually Abused: No   Social History   Tobacco Use  Smoking Status Former   Current packs/day: 0.00   Average packs/day: 1.5 packs/day for 22.0 years (33.0 ttl pk-yrs)   Types: Cigarettes   Start date: 05/08/1992   Quit date: 05/08/2014   Years since quitting: 9.1  Smokeless Tobacco Never  Tobacco Comments   Smoked over 25 years, quit after Heart Attack in 2015.     Social History   Substance and Sexual Activity  Alcohol Use No   Alcohol/week: 0.0 standard drinks of alcohol    Family History:  Family History  Problem Relation Age of Onset   Heart Problems Mother    Liver cancer Brother    Diabetes Daughter    Breast cancer Neg Hx     Past medical history, surgical history, medications, allergies, family history and social history reviewed with patient today and changes made to appropriate areas of the chart.   Review of Systems  Constitutional: Negative.   HENT: Negative.    Eyes: Negative.   Respiratory: Negative.    Gastrointestinal: Negative.   Genitourinary: Negative.   Musculoskeletal: Negative.   Skin: Negative.   Neurological: Negative.   Endo/Heme/Allergies:  Positive for environmental allergies. Negative for polydipsia. Bruises/bleeds easily.  Psychiatric/Behavioral: Negative.      All other ROS negative except what is listed above and in the HPI.      Objective:    BP 104/64   Pulse 62   Temp 97.7 F (36.5 C) (Oral)   Ht 5\' 4"  (1.626 m)   Wt 142 lb 12.8 oz (64.8 kg)   SpO2 100%   BMI 24.51 kg/m   Wt Readings  from Last 3 Encounters:  06/28/23 142 lb 12.8 oz (64.8 kg)  01/25/23 144 lb (65.3  kg)  01/21/23 144 lb 11.2 oz (65.6 kg)    Physical Exam     06/28/2023    2:13 PM 06/17/2021    6:17 PM 05/10/2020   10:42 AM 05/02/2019    3:30 PM 06/02/2017    1:22 PM  6CIT Screen  What Year? 0 points 0 points 0 points 0 points 0 points  What month? 0 points 0 points 0 points 0 points 0 points  What time? 0 points 0 points 0 points 0 points 0 points  Count back from 20 0 points 0 points 0 points 0 points 0 points  Months in reverse 0 points 0 points 0 points 0 points 0 points  Repeat phrase 0 points 0 points 0 points 0 points 0 points  Total Score 0 points 0 points 0 points 0 points 0 points    Results for orders placed or performed in visit on 12/24/22  Comprehensive metabolic panel  Result Value Ref Range   Glucose 79 70 - 99 mg/dL   BUN 14 8 - 27 mg/dL   Creatinine, Ser 1.61 (H) 0.57 - 1.00 mg/dL   eGFR 60 >09 UE/AVW/0.98   BUN/Creatinine Ratio 14 12 - 28   Sodium 141 134 - 144 mmol/L   Potassium 4.3 3.5 - 5.2 mmol/L   Chloride 104 96 - 106 mmol/L   CO2 23 20 - 29 mmol/L   Calcium 9.8 8.7 - 10.3 mg/dL   Total Protein 6.9 6.0 - 8.5 g/dL   Albumin 4.5 3.8 - 4.8 g/dL   Globulin, Total 2.4 1.5 - 4.5 g/dL   Albumin/Globulin Ratio 1.9 1.2 - 2.2   Bilirubin Total 1.7 (H) 0.0 - 1.2 mg/dL   Alkaline Phosphatase 128 (H) 44 - 121 IU/L   AST 22 0 - 40 IU/L   ALT 24 0 - 32 IU/L  CBC with Differential/Platelet  Result Value Ref Range   WBC 7.1 3.4 - 10.8 x10E3/uL   RBC 4.39 3.77 - 5.28 x10E6/uL   Hemoglobin 13.7 11.1 - 15.9 g/dL   Hematocrit 11.9 14.7 - 46.6 %   MCV 93 79 - 97 fL   MCH 31.2 26.6 - 33.0 pg   MCHC 33.7 31.5 - 35.7 g/dL   RDW 82.9 56.2 - 13.0 %   Platelets 216 150 - 450 x10E3/uL   Neutrophils 67 Not Estab. %   Lymphs 23 Not Estab. %   Monocytes 7 Not Estab. %   Eos 2 Not Estab. %   Basos 0 Not Estab. %   Neutrophils Absolute 4.8 1.4 - 7.0 x10E3/uL   Lymphocytes Absolute 1.6 0.7 - 3.1 x10E3/uL   Monocytes Absolute 0.5 0.1 - 0.9 x10E3/uL   EOS (ABSOLUTE)  0.2 0.0 - 0.4 x10E3/uL   Basophils Absolute 0.0 0.0 - 0.2 x10E3/uL   Immature Granulocytes 1 Not Estab. %   Immature Grans (Abs) 0.0 0.0 - 0.1 x10E3/uL  Lipid Panel w/o Chol/HDL Ratio  Result Value Ref Range   Cholesterol, Total 220 (H) 100 - 199 mg/dL   Triglycerides 865 (H) 0 - 149 mg/dL   HDL 58 >78 mg/dL   VLDL Cholesterol Cal 53 (H) 5 - 40 mg/dL   LDL Chol Calc (NIH) 469 (H) 0 - 99 mg/dL      Assessment & Plan:   Problem List Items Addressed This Visit       Cardiovascular and Mediastinum  CAD (coronary artery disease)    Will keep BP and cholesterol under good control. Continue to monitor. Labs drawn today.      Relevant Medications   aspirin EC 81 MG tablet   atorvastatin (LIPITOR) 80 MG tablet   metoprolol tartrate (LOPRESSOR) 25 MG tablet   Other Relevant Orders   CBC with Differential/Platelet   Comprehensive metabolic panel   Aortic atherosclerosis (HCC)    Will keep BP and cholesterol under good control. Continue to monitor. Labs drawn today.      Relevant Medications   aspirin EC 81 MG tablet   atorvastatin (LIPITOR) 80 MG tablet   metoprolol tartrate (LOPRESSOR) 25 MG tablet   Other Relevant Orders   CBC with Differential/Platelet   Comprehensive metabolic panel   Senile purpura (HCC)    Reassured patient. Continue to monitor.       Relevant Medications   aspirin EC 81 MG tablet   atorvastatin (LIPITOR) 80 MG tablet   metoprolol tartrate (LOPRESSOR) 25 MG tablet   Other Relevant Orders   CBC with Differential/Platelet   Comprehensive metabolic panel   Atherosclerosis of native arteries of extremity with intermittent claudication (HCC)    Will keep BP and cholesterol under good control. Continue to monitor. Labs drawn today.      Relevant Medications   aspirin EC 81 MG tablet   atorvastatin (LIPITOR) 80 MG tablet   metoprolol tartrate (LOPRESSOR) 25 MG tablet   Other Relevant Orders   CBC with Differential/Platelet   Comprehensive metabolic  panel     Genitourinary   Benign hypertensive renal disease    Under good control on current regimen. Continue current regimen. Continue to monitor. Call with any concerns. Refills given. Labs drawn today.        Relevant Orders   CBC with Differential/Platelet   Comprehensive metabolic panel   TSH   Microalbumin, Urine Waived   Overactive bladder    Will check urine today. Await results.       Relevant Orders   CBC with Differential/Platelet   Comprehensive metabolic panel   Urinalysis, Routine w reflex microscopic     Other   Hyperlipidemia    Under good control on current regimen. Continue current regimen. Continue to monitor. Call with any concerns. Refills given. Labs drawn today.        Relevant Medications   aspirin EC 81 MG tablet   atorvastatin (LIPITOR) 80 MG tablet   metoprolol tartrate (LOPRESSOR) 25 MG tablet   Other Relevant Orders   CBC with Differential/Platelet   Comprehensive metabolic panel   Lipid Panel w/o Chol/HDL Ratio   Depression, major, recurrent, in remission (HCC)    Doing great. Not on medicine. Continue to monitor.       Relevant Orders   CBC with Differential/Platelet   Comprehensive metabolic panel   Other Visit Diagnoses     Encounter for annual wellness visit (AWV) in Medicare patient    -  Primary   Preventative care discussed today as below.   Routine general medical examination at a health care facility       Vaccines declined. Screening labs checked today. Mammogram ordered. DEXA and colonoscopy up to date. Continue diet and exercise. Call with any concerns.   Encounter for screening mammogram for malignant neoplasm of breast       Mammogram ordered today.   Relevant Orders   MM 3D SCREENING MAMMOGRAM BILATERAL BREAST        Preventative Services:  Health Risk  Assessment and Personalized Prevention Plan: done today Bone Mass Measurements: Up to date Breast Cancer Screening: Ordered today CVD Screening: done  today Cervical Cancer Screening: N/A Colon Cancer Screening: Up to date Depression Screening: Done today Diabetes Screening: Done today Glaucoma Screening: see your eye doctor Hepatitis B vaccine: N/A Hepatitis C screening: up to date HIV Screening: up to date Flu Vaccine: Declined Lung cancer Screening: N/A Obesity Screening: Done today Pneumonia Vaccines: Declined STI Screening: N/A  Follow up plan: Return in about 6 months (around 12/26/2023).   LABORATORY TESTING:  - Pap smear: not applicable  IMMUNIZATIONS:   - Tdap: Tetanus vaccination status reviewed: Refused. - Influenza: Refused - Pneumovax: Up to date - Prevnar: Refused - Zostavax vaccine: Refused  SCREENING: -Mammogram: Ordered today  - Colonoscopy: Up to date  - Bone Density: Up to date   PATIENT COUNSELING:   Advised to take 1 mg of folate supplement per day if capable of pregnancy.   Sexuality: Discussed sexually transmitted diseases, partner selection, use of condoms, avoidance of unintended pregnancy  and contraceptive alternatives.   Advised to avoid cigarette smoking.  I discussed with the patient that most people either abstain from alcohol or drink within safe limits (<=14/week and <=4 drinks/occasion for males, <=7/weeks and <= 3 drinks/occasion for females) and that the risk for alcohol disorders and other health effects rises proportionally with the number of drinks per week and how often a drinker exceeds daily limits.  Discussed cessation/primary prevention of drug use and availability of treatment for abuse.   Diet: Encouraged to adjust caloric intake to maintain  or achieve ideal body weight, to reduce intake of dietary saturated fat and total fat, to limit sodium intake by avoiding high sodium foods and not adding table salt, and to maintain adequate dietary potassium and calcium preferably from fresh fruits, vegetables, and low-fat dairy products.    stressed the importance of regular  exercise  Injury prevention: Discussed safety belts, safety helmets, smoke detector, smoking near bedding or upholstery.   Dental health: Discussed importance of regular tooth brushing, flossing, and dental visits.    NEXT PREVENTATIVE PHYSICAL DUE IN 1 YEAR. Return in about 6 months (around 12/26/2023).

## 2023-06-28 NOTE — Assessment & Plan Note (Signed)
Doing great. Not on medicine. Continue to monitor.

## 2023-06-28 NOTE — Assessment & Plan Note (Signed)
Under good control on current regimen. Continue current regimen. Continue to monitor. Call with any concerns. Refills given. Labs drawn today.   

## 2023-06-28 NOTE — Patient Instructions (Addendum)
Mammogram is scheduled for Tuesday 10/8 @ 1:40pm [facility]  Address: [address], [place], [place] [address] Phone: [phone]  [facility] [address] [place],  [place]  [address] Phone: [phone]   Preventative Services:  Health Risk Assessment and Personalized Prevention Plan: done today Bone Mass Measurements: Up to date Breast Cancer Screening: Ordered today CVD Screening: done today Cervical Cancer Screening: N/A Colon Cancer Screening: Up to date Depression Screening: Done today Diabetes Screening: Done today Glaucoma Screening: see your eye doctor Hepatitis B vaccine: N/A Hepatitis C screening: up to date HIV Screening: up to date Flu Vaccine: Declined Lung cancer Screening: N/A Obesity Screening: Done today Pneumonia Vaccines: Declined STI Screening: N/A

## 2023-06-28 NOTE — Assessment & Plan Note (Signed)
Reassured patient. Continue to monitor.  

## 2023-06-29 ENCOUNTER — Telehealth: Payer: Self-pay | Admitting: Family Medicine

## 2023-06-29 LAB — CBC WITH DIFFERENTIAL/PLATELET
Basophils Absolute: 0 10*3/uL (ref 0.0–0.2)
Basos: 1 %
EOS (ABSOLUTE): 0.2 10*3/uL (ref 0.0–0.4)
Eos: 2 %
Hematocrit: 40.8 % (ref 34.0–46.6)
Hemoglobin: 13.3 g/dL (ref 11.1–15.9)
Immature Grans (Abs): 0 10*3/uL (ref 0.0–0.1)
Immature Granulocytes: 0 %
Lymphocytes Absolute: 2 10*3/uL (ref 0.7–3.1)
Lymphs: 23 %
MCH: 31.6 pg (ref 26.6–33.0)
MCHC: 32.6 g/dL (ref 31.5–35.7)
MCV: 97 fL (ref 79–97)
Monocytes Absolute: 0.6 10*3/uL (ref 0.1–0.9)
Monocytes: 7 %
Neutrophils Absolute: 5.9 10*3/uL (ref 1.4–7.0)
Neutrophils: 67 %
Platelets: 235 10*3/uL (ref 150–450)
RBC: 4.21 x10E6/uL (ref 3.77–5.28)
RDW: 13.1 % (ref 11.7–15.4)
WBC: 8.8 10*3/uL (ref 3.4–10.8)

## 2023-06-29 LAB — COMPREHENSIVE METABOLIC PANEL
ALT: 24 [IU]/L (ref 0–32)
AST: 23 [IU]/L (ref 0–40)
Albumin: 4.5 g/dL (ref 3.8–4.8)
Alkaline Phosphatase: 104 [IU]/L (ref 44–121)
BUN/Creatinine Ratio: 15 (ref 12–28)
BUN: 14 mg/dL (ref 8–27)
Bilirubin Total: 2.4 mg/dL — ABNORMAL HIGH (ref 0.0–1.2)
CO2: 23 mmol/L (ref 20–29)
Calcium: 9.2 mg/dL (ref 8.7–10.3)
Chloride: 105 mmol/L (ref 96–106)
Creatinine, Ser: 0.94 mg/dL (ref 0.57–1.00)
Globulin, Total: 2.1 g/dL (ref 1.5–4.5)
Glucose: 130 mg/dL — ABNORMAL HIGH (ref 70–99)
Potassium: 4 mmol/L (ref 3.5–5.2)
Sodium: 141 mmol/L (ref 134–144)
Total Protein: 6.6 g/dL (ref 6.0–8.5)
eGFR: 64 mL/min/{1.73_m2} (ref >59)

## 2023-06-29 LAB — LIPID PANEL W/O CHOL/HDL RATIO
Cholesterol, Total: 168 mg/dL (ref 100–199)
HDL: 49 mg/dL (ref >39)
LDL Chol Calc (NIH): 89 mg/dL (ref 0–99)
Triglycerides: 175 mg/dL — ABNORMAL HIGH (ref 0–149)
VLDL Cholesterol Cal: 30 mg/dL (ref 5–40)

## 2023-06-29 LAB — TSH: TSH: 2.53 u[IU]/mL (ref 0.450–4.500)

## 2023-06-29 NOTE — Telephone Encounter (Unsigned)
Copied from CRM [id number]. Topic: General - Inquiry >> Jun 29, 2023  9:31 AM Tonya Cabrera wrote: Reason for CRM: Pt would like someone to call her back about her lab results.  CB@  [phone]

## 2023-07-01 NOTE — Telephone Encounter (Signed)
Informed patient of results.  

## 2023-07-06 ENCOUNTER — Ambulatory Visit
Admission: RE | Admit: 2023-07-06 | Discharge: 2023-07-06 | Disposition: A | Discharge status: Home / Self Care | Payer: 59 | Source: Ambulatory Visit | Attending: Family Medicine | Admitting: Family Medicine

## 2023-07-06 DIAGNOSIS — Z1231 Encounter for screening mammogram for malignant neoplasm of breast: Secondary | ICD-10-CM | POA: Diagnosis not present

## 2023-11-09 ENCOUNTER — Encounter: Payer: Self-pay | Admitting: Family Medicine

## 2023-12-02 ENCOUNTER — Ambulatory Visit: Payer: Self-pay | Admitting: Family Medicine

## 2023-12-02 NOTE — Telephone Encounter (Signed)
 Copied from CRM [id number]. Topic: Clinical - Medical Advice >> Dec 02, 2023  9:44 AM Tonya Cabrera wrote: Patient states that she started experiencing stomach cramps, headache, congestion, cough, fatigue and loss of smell/taste 5 days ago. Patient said her daughter brought her a Covid test yesterday and she tested positive. Patient wants to make sure she is treating the symptoms appropriately. Please advise.   Chief Complaint: COVID Suspicion with Symptoms Symptoms: Loss of Taste and Smell Frequency: 2 Days Pertinent Negatives: Patient denies chest pain, dyspnea, fever Disposition: [] ED /[] Urgent Care (no appt availability in office) / [] Appointment(In office/virtual)/ []  [facility]/ [x] Home Care/ [] Refused Recommended Disposition /[] [facility]/ [x]  Follow-up with PCP Additional Notes: JW is being triaged for COVID Symptoms after a previous positive home test. The patient reports an improvement in her symptoms and wanted to inquire on home care and if she needed to come in for an appointment at this time. Provided home care instructions and extensive education per Telephonic Triage Assessment and protocol. Patient verbalized understanding and agreed to disposition.   Reason for Disposition  [1] COVID-19 diagnosed by positive lab test (e.g., PCR, rapid self-test kit) AND [2] mild symptoms (e.g., cough, fever, others) AND [3] no complications or SOB  Answer Assessment - Initial Assessment Questions 1. COVID-19 DIAGNOSIS: "How do you know that you have COVID?" (e.g., positive lab test or self-test, diagnosed by doctor or NP/PA, symptoms after exposure).     Yesterday, Home Test  2. COVID-19 EXPOSURE: "Was there any known exposure to COVID before the symptoms began?" CDC Definition of close contact: within 6 feet (2 meters) for a total of 15 minutes or more over a 24-hour period.      Unsure  3. ONSET: "When did the COVID-19 symptoms start?"      2 Days  4. WORST SYMPTOM:  "What is your worst symptom?" (e.g., cough, fever, shortness of breath, muscle aches)     Cough  5. COUGH: "Do you have a cough?" If Yes, ask: "How bad is the cough?"       Yes  6. FEVER: "Do you have a fever?" If Yes, ask: "What is your temperature, how was it measured, and when did it start?"     No  7. RESPIRATORY STATUS: "Describe your breathing?" (e.g., normal; shortness of breath, wheezing, unable to speak)      Not currently   8. BETTER-SAME-WORSE: "Are you getting better, staying the same or getting worse compared to yesterday?"  If getting worse, ask, "In what way?"     Better  9. OTHER SYMPTOMS: "Do you have any other symptoms?"  (e.g., chills, fatigue, headache, loss of smell or taste, muscle pain, sore throat)     Loss of Taste and Smell, Fatigue  10. HIGH RISK DISEASE: "Do you have any chronic medical problems?" (e.g., asthma, heart or lung disease, weak immune system, obesity, etc.)       Bypass Surgery in the past  11. VACCINE: "Have you had the COVID-19 vaccine?" If Yes, ask: "Which one, how many shots, when did you get it?"       Yes, Pfizer  12. PREGNANCY: "Is there any chance you are pregnant?" "When was your last menstrual period?"       No and No  13. O2 SATURATION MONITOR:  "Do you use an oxygen saturation monitor (pulse oximeter) at home?" If Yes, ask "What is your reading (oxygen level) today?" "What is your usual oxygen saturation reading?" (e.g., 95%)  Does not have one  Protocols used: Coronavirus (COVID-19) Diagnosed or Suspected-A-AH

## 2023-12-21 ENCOUNTER — Encounter: Payer: Self-pay | Admitting: Family Medicine

## 2023-12-21 ENCOUNTER — Ambulatory Visit (INDEPENDENT_AMBULATORY_CARE_PROVIDER_SITE_OTHER): Payer: 59 | Admitting: Family Medicine

## 2023-12-21 VITALS — BP 135/78 | HR 63 | Temp 97.5°F | Ht 64.0 in | Wt 146.6 lb

## 2023-12-21 DIAGNOSIS — E782 Mixed hyperlipidemia: Secondary | ICD-10-CM | POA: Diagnosis not present

## 2023-12-21 DIAGNOSIS — Z1211 Encounter for screening for malignant neoplasm of colon: Secondary | ICD-10-CM | POA: Diagnosis not present

## 2023-12-21 DIAGNOSIS — I129 Hypertensive chronic kidney disease with stage 1 through stage 4 chronic kidney disease, or unspecified chronic kidney disease: Secondary | ICD-10-CM

## 2023-12-21 DIAGNOSIS — F3340 Major depressive disorder, recurrent, in remission, unspecified: Secondary | ICD-10-CM | POA: Diagnosis not present

## 2023-12-21 MED ORDER — METOPROLOL TARTRATE 25 MG PO TABS: ORAL_TABLET | ORAL | 1 refills | Status: DC

## 2023-12-21 MED ORDER — ALENDRONATE SODIUM 70 MG PO TABS: ORAL_TABLET | ORAL | 3 refills | Status: AC

## 2023-12-21 MED ORDER — ATORVASTATIN CALCIUM 80 MG PO TABS: 80.0000 mg | ORAL_TABLET | Freq: Every day | ORAL | 1 refills | Status: DC

## 2023-12-21 NOTE — Assessment & Plan Note (Signed)
 Under good control on current regimen. Continue current regimen. Continue to monitor. Call with any concerns. Refills given. Labs drawn today.

## 2023-12-21 NOTE — Progress Notes (Signed)
 BP 135/78 (BP Location: Left Arm, Cuff Size: Large)   Pulse 63   Temp (!) 97.5 F (36.4 C) (Oral)   Ht 5\' 4"  (1.626 m)   Wt 146 lb 9.6 oz (66.5 kg)   SpO2 100%   BMI 25.16 kg/m    Subjective:    Patient ID: Tonya Cabrera, female    DOB: [birth year], 73 y.o.   MRN: [id number]  HPI: Tonya Cabrera is a 73 y.o. female  Chief Complaint  Patient presents with   Depression   Hyperlipidemia   HYPERTENSION / HYPERLIPIDEMIA Satisfied with current treatment? yes Duration of hypertension: chronic BP monitoring frequency: not checking BP medication side effects: no Past BP meds: metoprolol, lasix Duration of hyperlipidemia: chronic Cholesterol medication side effects: no Cholesterol supplements: none Past cholesterol medications: atorvastatin Medication compliance: excellent compliance Aspirin: yes Recent stressors: no Recurrent headaches: no Visual changes: no Palpitations: no Dyspnea: no Chest pain: no Lower extremity edema: no Dizzy/lightheaded: no  DEPRESSION Mood status: controlled Satisfied with current treatment?: yes Symptom severity: mild  Duration of current treatment : Not on anything right now Side effects: no Psychotherapy/counseling: no  Depressed mood: no Anxious mood: no Anhedonia: no Significant weight loss or gain: no Insomnia: no  Fatigue: no Feelings of worthlessness or guilt: no Impaired concentration/indecisiveness: no Suicidal ideations: no Hopelessness: no Crying spells: no    12/21/2023    1:29 PM 06/28/2023    1:55 PM 01/21/2023    2:15 PM 12/24/2022    2:04 PM 06/25/2022    1:16 PM  Depression screen PHQ 2/9  Decreased Interest 0 0 0 0 0  Down, Depressed, Hopeless 0 0 0 0 0  PHQ - 2 Score 0 0 0 0 0  Altered sleeping 3 3 3 3 3   Tired, decreased energy 0 0 3 0 0  Change in appetite 0 0 0 0 0  Feeling bad or failure about yourself  0 0 0 0 0  Trouble concentrating 0 0 0 0 0  Moving slowly or fidgety/restless 0 0 0 0 0  Suicidal  thoughts 0 0 0 0 0  PHQ-9 Score 3 3 6 3 3   Difficult doing work/chores Very difficult Somewhat difficult Not difficult at all Not difficult at all Not difficult at all    Relevant past medical, surgical, family and social history reviewed and updated as indicated. Interim medical history since our last visit reviewed. Allergies and medications reviewed and updated.  Review of Systems  Constitutional: Negative.   Respiratory: Negative.    Cardiovascular: Negative.   Musculoskeletal: Negative.   Psychiatric/Behavioral: Negative.      Per HPI unless specifically indicated above     Objective:    BP 135/78 (BP Location: Left Arm, Cuff Size: Large)   Pulse 63   Temp (!) 97.5 F (36.4 C) (Oral)   Ht 5\' 4"  (1.626 m)   Wt 146 lb 9.6 oz (66.5 kg)   SpO2 100%   BMI 25.16 kg/m   Wt Readings from Last 3 Encounters:  12/21/23 146 lb 9.6 oz (66.5 kg)  06/28/23 142 lb 12.8 oz (64.8 kg)  01/25/23 144 lb (65.3 kg)    Physical Exam Vitals and nursing note reviewed.  Constitutional:      General: She is not in acute distress.    Appearance: Normal appearance. She is not ill-appearing, toxic-appearing or diaphoretic.  HENT:     Head: Normocephalic and atraumatic.     Right Ear: External ear normal.  Left Ear: External ear normal.     Nose: Nose normal.     Mouth/Throat:     Mouth: Mucous membranes are moist.     Pharynx: Oropharynx is clear.  Eyes:     General: No scleral icterus.       Right eye: No discharge.        Left eye: No discharge.     Extraocular Movements: Extraocular movements intact.     Conjunctiva/sclera: Conjunctivae normal.     Pupils: Pupils are equal, round, and reactive to light.  Cardiovascular:     Rate and Rhythm: Normal rate and regular rhythm.     Pulses: Normal pulses.     Heart sounds: Normal heart sounds. No murmur heard.    No friction rub. No gallop.  Pulmonary:     Effort: Pulmonary effort is normal. No respiratory distress.     Breath  sounds: Normal breath sounds. No stridor. No wheezing, rhonchi or rales.  Chest:     Chest wall: No tenderness.  Musculoskeletal:        General: Normal range of motion.     Cervical back: Normal range of motion and neck supple.  Skin:    General: Skin is warm and dry.     Capillary Refill: Capillary refill takes less than 2 seconds.     Coloration: Skin is not jaundiced or pale.     Findings: No bruising, erythema, lesion or rash.  Neurological:     General: No focal deficit present.     Mental Status: She is alert and oriented to person, place, and time. Mental status is at baseline.  Psychiatric:        Mood and Affect: Mood normal.        Behavior: Behavior normal.        Thought Content: Thought content normal.        Judgment: Judgment normal.     Results for orders placed or performed in visit on 06/28/23  Microscopic Examination   Collection Time: 06/28/23  1:58 PM   Urine  Result Value Ref Range   WBC, UA 0-5 0 - 5 /hpf   RBC, Urine 3-10 (A) 0 - 2 /hpf   Epithelial Cells (non renal) 0-10 0 - 10 /hpf   Bacteria, UA None seen None seen/Few  Urinalysis, Routine w reflex microscopic   Collection Time: 06/28/23  1:58 PM  Result Value Ref Range   Specific Gravity, UA 1.010 1.005 - 1.030   pH, UA 6.0 5.0 - 7.5   Color, UA Yellow Yellow   Appearance Ur Clear Clear   Leukocytes,UA Negative Negative   Protein,UA Negative Negative/Trace   Glucose, UA Negative Negative   Ketones, UA Negative Negative   RBC, UA 2+ (A) Negative   Bilirubin, UA Negative Negative   Urobilinogen, Ur 0.2 0.2 - 1.0 mg/dL   Nitrite, UA Negative Negative   Microscopic Examination See below:   Microalbumin, Urine Waived   Collection Time: 06/28/23  1:58 PM  Result Value Ref Range   Microalb, Ur Waived 80 (H) 0 - 19 mg/L   Creatinine, Urine Waived 50 10 - 300 mg/dL   Microalb/Creat Ratio >300 (H) <30 mg/g  CBC with Differential/Platelet   Collection Time: 06/28/23  1:59 PM  Result Value Ref  Range   WBC 8.8 3.4 - 10.8 x10E3/uL   RBC 4.21 3.77 - 5.28 x10E6/uL   Hemoglobin 13.3 11.1 - 15.9 g/dL   Hematocrit 63.8 75.6 - 46.6 %  MCV 97 79 - 97 fL   MCH 31.6 26.6 - 33.0 pg   MCHC 32.6 31.5 - 35.7 g/dL   RDW 24.4 01.0 - 27.2 %   Platelets 235 150 - 450 x10E3/uL   Neutrophils 67 Not Estab. %   Lymphs 23 Not Estab. %   Monocytes 7 Not Estab. %   Eos 2 Not Estab. %   Basos 1 Not Estab. %   Neutrophils Absolute 5.9 1.4 - 7.0 x10E3/uL   Lymphocytes Absolute 2.0 0.7 - 3.1 x10E3/uL   Monocytes Absolute 0.6 0.1 - 0.9 x10E3/uL   EOS (ABSOLUTE) 0.2 0.0 - 0.4 x10E3/uL   Basophils Absolute 0.0 0.0 - 0.2 x10E3/uL   Immature Granulocytes 0 Not Estab. %   Immature Grans (Abs) 0.0 0.0 - 0.1 x10E3/uL  Comprehensive metabolic panel   Collection Time: 06/28/23  1:59 PM  Result Value Ref Range   Glucose 130 (H) 70 - 99 mg/dL   BUN 14 8 - 27 mg/dL   Creatinine, Ser 5.36 0.57 - 1.00 mg/dL   eGFR 64 >64 QI/HKV/4.25   BUN/Creatinine Ratio 15 12 - 28   Sodium 141 134 - 144 mmol/L   Potassium 4.0 3.5 - 5.2 mmol/L   Chloride 105 96 - 106 mmol/L   CO2 23 20 - 29 mmol/L   Calcium 9.2 8.7 - 10.3 mg/dL   Total Protein 6.6 6.0 - 8.5 g/dL   Albumin 4.5 3.8 - 4.8 g/dL   Globulin, Total 2.1 1.5 - 4.5 g/dL   Bilirubin Total 2.4 (H) 0.0 - 1.2 mg/dL   Alkaline Phosphatase 104 44 - 121 IU/L   AST 23 0 - 40 IU/L   ALT 24 0 - 32 IU/L  Lipid Panel w/o Chol/HDL Ratio   Collection Time: 06/28/23  1:59 PM  Result Value Ref Range   Cholesterol, Total 168 100 - 199 mg/dL   Triglycerides 956 (H) 0 - 149 mg/dL   HDL 49 >38 mg/dL   VLDL Cholesterol Cal 30 5 - 40 mg/dL   LDL Chol Calc (NIH) 89 0 - 99 mg/dL  TSH   Collection Time: 06/28/23  1:59 PM  Result Value Ref Range   TSH 2.530 0.450 - 4.500 uIU/mL      Assessment & Plan:   Problem List Items Addressed This Visit       Genitourinary   Benign hypertensive renal disease - Primary   Under good control on current regimen. Continue current  regimen. Continue to monitor. Call with any concerns. Refills given. Labs drawn today.        Relevant Orders   CBC with Differential/Platelet   Comprehensive metabolic panel     Other   Hyperlipidemia   Under good control on current regimen. Continue current regimen. Continue to monitor. Call with any concerns. Refills given. Labs drawn today.       Relevant Medications   atorvastatin (LIPITOR) 80 MG tablet   metoprolol tartrate (LOPRESSOR) 25 MG tablet   Other Relevant Orders   CBC with Differential/Platelet   Lipid Panel w/o Chol/HDL Ratio   Comprehensive metabolic panel   Depression, major, recurrent, in remission (HCC)   Doing well not on medicine. Continue to monitor. Call with any concerns.       Relevant Orders   CBC with Differential/Platelet   Comprehensive metabolic panel   Other Visit Diagnoses       Screening for colon cancer       Declines colonoscopy. Cologuard ordered today.   Relevant  Orders   Cologuard        Follow up plan: Return in about 6 months (around 06/22/2024) for physical.

## 2023-12-21 NOTE — Assessment & Plan Note (Signed)
Doing well not on medicine. Continue to monitor. Call with any concerns.

## 2023-12-22 LAB — CBC WITH DIFFERENTIAL/PLATELET
Basophils Absolute: 0 10*3/uL (ref 0.0–0.2)
Basos: 0 %
EOS (ABSOLUTE): 0.2 10*3/uL (ref 0.0–0.4)
Eos: 3 %
Hematocrit: 37.1 % (ref 34.0–46.6)
Hemoglobin: 12.3 g/dL (ref 11.1–15.9)
Immature Grans (Abs): 0 10*3/uL (ref 0.0–0.1)
Immature Granulocytes: 0 %
Lymphocytes Absolute: 2.1 10*3/uL (ref 0.7–3.1)
Lymphs: 27 %
MCH: 31.2 pg (ref 26.6–33.0)
MCHC: 33.2 g/dL (ref 31.5–35.7)
MCV: 94 fL (ref 79–97)
Monocytes Absolute: 0.5 10*3/uL (ref 0.1–0.9)
Monocytes: 7 %
Neutrophils Absolute: 4.8 10*3/uL (ref 1.4–7.0)
Neutrophils: 63 %
Platelets: 256 10*3/uL (ref 150–450)
RBC: 3.94 x10E6/uL (ref 3.77–5.28)
RDW: 13.3 % (ref 11.7–15.4)
WBC: 7.7 10*3/uL (ref 3.4–10.8)

## 2023-12-22 LAB — COMPREHENSIVE METABOLIC PANEL
ALT: 18 IU/L (ref 0–32)
AST: 16 IU/L (ref 0–40)
Albumin: 4.3 g/dL (ref 3.8–4.8)
Alkaline Phosphatase: 113 IU/L (ref 44–121)
BUN/Creatinine Ratio: 15 (ref 12–28)
BUN: 14 mg/dL (ref 8–27)
Bilirubin Total: 1.6 mg/dL — ABNORMAL HIGH (ref 0.0–1.2)
CO2: 22 mmol/L (ref 20–29)
Calcium: 9.3 mg/dL (ref 8.7–10.3)
Chloride: 106 mmol/L (ref 96–106)
Creatinine, Ser: 0.91 mg/dL (ref 0.57–1.00)
Globulin, Total: 2.1 g/dL (ref 1.5–4.5)
Glucose: 84 mg/dL (ref 70–99)
Potassium: 4.2 mmol/L (ref 3.5–5.2)
Sodium: 143 mmol/L (ref 134–144)
Total Protein: 6.4 g/dL (ref 6.0–8.5)
eGFR: 67 mL/min/{1.73_m2} (ref >59)

## 2023-12-22 LAB — LIPID PANEL W/O CHOL/HDL RATIO
Cholesterol, Total: 173 mg/dL (ref 100–199)
HDL: 55 mg/dL (ref >39)
LDL Chol Calc (NIH): 91 mg/dL (ref 0–99)
Triglycerides: 158 mg/dL — ABNORMAL HIGH (ref 0–149)
VLDL Cholesterol Cal: 27 mg/dL (ref 5–40)

## 2023-12-23 ENCOUNTER — Encounter: Payer: Self-pay | Admitting: Family Medicine

## 2023-12-24 ENCOUNTER — Ambulatory Visit: Payer: Self-pay | Admitting: Family Medicine

## 2023-12-27 ENCOUNTER — Ambulatory Visit
Admission: RE | Admit: 2023-12-27 | Discharge: 2023-12-27 | Disposition: A | Discharge status: Home / Self Care | Source: Ambulatory Visit | Attending: Acute Care | Admitting: Acute Care

## 2023-12-27 DIAGNOSIS — Z87891 Personal history of nicotine dependence: Secondary | ICD-10-CM | POA: Diagnosis not present

## 2023-12-27 DIAGNOSIS — Z122 Encounter for screening for malignant neoplasm of respiratory organs: Secondary | ICD-10-CM | POA: Insufficient documentation

## 2023-12-27 DIAGNOSIS — Z1211 Encounter for screening for malignant neoplasm of colon: Secondary | ICD-10-CM | POA: Diagnosis not present

## 2023-12-31 LAB — COLOGUARD: COLOGUARD: NEGATIVE

## 2024-01-31 ENCOUNTER — Other Ambulatory Visit: Payer: Self-pay

## 2024-01-31 DIAGNOSIS — Z122 Encounter for screening for malignant neoplasm of respiratory organs: Secondary | ICD-10-CM

## 2024-01-31 DIAGNOSIS — Z87891 Personal history of nicotine dependence: Secondary | ICD-10-CM

## 2024-03-23 ENCOUNTER — Ambulatory Visit: Payer: 59 | Admitting: Dermatology

## 2024-04-17 ENCOUNTER — Ambulatory Visit: Admitting: Dermatology

## 2024-04-17 DIAGNOSIS — W908XXA Exposure to other nonionizing radiation, initial encounter: Secondary | ICD-10-CM

## 2024-04-17 DIAGNOSIS — L820 Inflamed seborrheic keratosis: Secondary | ICD-10-CM | POA: Diagnosis not present

## 2024-04-17 DIAGNOSIS — L918 Other hypertrophic disorders of the skin: Secondary | ICD-10-CM

## 2024-04-17 DIAGNOSIS — D692 Other nonthrombocytopenic purpura: Secondary | ICD-10-CM | POA: Diagnosis not present

## 2024-04-17 DIAGNOSIS — L821 Other seborrheic keratosis: Secondary | ICD-10-CM

## 2024-04-17 DIAGNOSIS — L570 Actinic keratosis: Secondary | ICD-10-CM

## 2024-04-17 DIAGNOSIS — Z1283 Encounter for screening for malignant neoplasm of skin: Secondary | ICD-10-CM | POA: Diagnosis not present

## 2024-04-17 DIAGNOSIS — L814 Other melanin hyperpigmentation: Secondary | ICD-10-CM | POA: Diagnosis not present

## 2024-04-17 DIAGNOSIS — L578 Other skin changes due to chronic exposure to nonionizing radiation: Secondary | ICD-10-CM | POA: Diagnosis not present

## 2024-04-17 DIAGNOSIS — D229 Melanocytic nevi, unspecified: Secondary | ICD-10-CM

## 2024-04-17 NOTE — Progress Notes (Unsigned)
 Follow-Up Visit   Subjective  Tonya Cabrera is a 73 y.o. female who presents for the following: Skin Cancer Screening and Full Body Skin Exam Hx of aks, isks Patient reports spots at face, chest, legs   The patient presents for Total-Body Skin Exam (TBSE) for skin cancer screening and mole check. The patient has spots, moles and lesions to be evaluated, some may be new or changing and the patient may have concern these could be cancer.  The following portions of the chart were reviewed this encounter and updated as appropriate: medications, allergies, medical history  Review of Systems:  No other skin or systemic complaints except as noted in HPI or Assessment and Plan.  Objective  Well appearing patient in no apparent distress; mood and affect are within normal limits.  A full examination was performed including scalp, head, eyes, ears, nose, lips, neck, chest, axillae, abdomen, back, buttocks, bilateral upper extremities, bilateral lower extremities, hands, feet, fingers, toes, fingernails, and toenails. All findings within normal limits unless otherwise noted below.   Relevant physical exam findings are noted in the Assessment and Plan.  left cheek x 3, chest x 2 (5) Erythematous thin papules/macules with gritty scale.  b/l legs x 3 , right mandible x 1 (4) Erythematous stuck-on, waxy papule or plaque  Assessment & Plan   SKIN CANCER SCREENING PERFORMED TODAY.  ACTINIC DAMAGE - Chronic condition, secondary to cumulative UV/sun exposure - diffuse scaly erythematous macules with underlying dyspigmentation - Recommend daily broad spectrum sunscreen SPF 30+ to sun-exposed areas, reapply every 2 hours as needed.  - Staying in the shade or wearing long sleeves, sun glasses (UVA+UVB protection) and wide brim hats (4-inch brim around the entire circumference of the hat) are also recommended for sun protection.  - Call for new or changing lesions.  LENTIGINES, SEBORRHEIC  KERATOSES, HEMANGIOMAS - Benign normal skin lesions - Benign-appearing - Call for any changes  MELANOCYTIC NEVI - Tan-brown and/or pink-flesh-colored symmetric macules and papules - Benign appearing on exam today - Observation - Call clinic for new or changing moles - Recommend daily use of broad spectrum spf 30+ sunscreen to sun-exposed areas.   Acrochordons (Skin Tags) - Fleshy, skin-colored pedunculated papules - Benign appearing.  - Observe. - If desired, they can be removed with an in office procedure that is not covered by insurance. - Please call the clinic if you notice any new or changing lesions.  Purpura - Chronic; persistent and recurrent.  Treatable, but not curable. - Violaceous macules and patches - Benign - Related to trauma, age, sun damage and/or use of blood thinners, chronic use of topical and/or oral steroids - Observe - Can use OTC arnica containing moisturizer such as Dermend Bruise Formula if desired - Call for worsening or other concerns  ACTINIC KERATOSIS (5) left cheek x 3, chest x 2 (5) Actinic keratoses are precancerous spots that appear secondary to cumulative UV radiation exposure/sun exposure over time. They are chronic with expected duration over 1 year. A portion of actinic keratoses will progress to squamous cell carcinoma of the skin. It is not possible to reliably predict which spots will progress to skin cancer and so treatment is recommended to prevent development of skin cancer.  Recommend daily broad spectrum sunscreen SPF 30+ to sun-exposed areas, reapply every 2 hours as needed.  Recommend staying in the shade or wearing long sleeves, sun glasses (UVA+UVB protection) and wide brim hats (4-inch brim around the entire circumference of the hat). Call for  new or changing lesions. Destruction of lesion - left cheek x 3, chest x 2 (5) Complexity: simple   Destruction method: cryotherapy   Informed consent: discussed and consent obtained    Timeout:  patient name, date of birth, surgical site, and procedure verified Lesion destroyed using liquid nitrogen: Yes   Region frozen until ice ball extended beyond lesion: Yes   Outcome: patient tolerated procedure well with no complications   Post-procedure details: wound care instructions given    INFLAMED SEBORRHEIC KERATOSIS (4) b/l legs x 3 , right mandible x 1 (4) Symptomatic, irritating, patient would like treated. Destruction of lesion - b/l legs x 3 , right mandible x 1 (4) Complexity: simple   Destruction method: cryotherapy   Informed consent: discussed and consent obtained   Timeout:  patient name, date of birth, surgical site, and procedure verified Lesion destroyed using liquid nitrogen: Yes   Region frozen until ice ball extended beyond lesion: Yes   Outcome: patient tolerated procedure well with no complications   Post-procedure details: wound care instructions given    Return in about 1 year (around 04/17/2025) for TBSE.  IEleanor Blush, CMA, am acting as scribe for Alm Rhyme, MD.   Documentation: I have reviewed the above documentation for accuracy and completeness, and I agree with the above.  Alm Rhyme, MD

## 2024-04-17 NOTE — Patient Instructions (Addendum)

## 2024-04-18 ENCOUNTER — Encounter: Payer: Self-pay | Admitting: Dermatology

## 2024-05-31 ENCOUNTER — Encounter: Payer: Self-pay | Admitting: Family Medicine

## 2024-06-21 ENCOUNTER — Encounter: Admitting: Family Medicine

## 2024-06-23 ENCOUNTER — Ambulatory Visit: Admitting: Family Medicine

## 2024-06-26 NOTE — Progress Notes (Signed)
 This encounter was created in error - please disregard.

## 2024-07-03 ENCOUNTER — Encounter: Payer: Self-pay | Admitting: Family Medicine

## 2024-07-03 ENCOUNTER — Ambulatory Visit (INDEPENDENT_AMBULATORY_CARE_PROVIDER_SITE_OTHER): Admitting: Family Medicine

## 2024-07-03 ENCOUNTER — Encounter

## 2024-07-03 VITALS — BP 123/74 | HR 60 | Temp 97.7°F | Ht 64.0 in | Wt 143.0 lb

## 2024-07-03 DIAGNOSIS — I129 Hypertensive chronic kidney disease with stage 1 through stage 4 chronic kidney disease, or unspecified chronic kidney disease: Secondary | ICD-10-CM | POA: Diagnosis not present

## 2024-07-03 DIAGNOSIS — Z0000 Encounter for general adult medical examination without abnormal findings: Secondary | ICD-10-CM | POA: Diagnosis not present

## 2024-07-03 DIAGNOSIS — D692 Other nonthrombocytopenic purpura: Secondary | ICD-10-CM | POA: Diagnosis not present

## 2024-07-03 DIAGNOSIS — I25119 Atherosclerotic heart disease of native coronary artery with unspecified angina pectoris: Secondary | ICD-10-CM

## 2024-07-03 DIAGNOSIS — F3340 Major depressive disorder, recurrent, in remission, unspecified: Secondary | ICD-10-CM

## 2024-07-03 DIAGNOSIS — E782 Mixed hyperlipidemia: Secondary | ICD-10-CM

## 2024-07-03 DIAGNOSIS — I700 Atherosclerosis of aorta: Secondary | ICD-10-CM

## 2024-07-03 DIAGNOSIS — Z1231 Encounter for screening mammogram for malignant neoplasm of breast: Secondary | ICD-10-CM | POA: Diagnosis not present

## 2024-07-03 LAB — MICROALBUMIN, URINE WAIVED
Creatinine, Urine Waived: 200 mg/dL (ref 10–300)
Microalb, Ur Waived: 150 mg/L — ABNORMAL HIGH (ref 0–19)

## 2024-07-03 MED ORDER — ATORVASTATIN CALCIUM 80 MG PO TABS: 80.0000 mg | ORAL_TABLET | Freq: Every day | ORAL | 1 refills | Status: AC

## 2024-07-03 MED ORDER — METOPROLOL TARTRATE 25 MG PO TABS: ORAL_TABLET | ORAL | 1 refills | Status: AC

## 2024-07-03 NOTE — Assessment & Plan Note (Signed)
 Reassured patient. Continue to monitor.

## 2024-07-03 NOTE — Assessment & Plan Note (Signed)
Will keep her BP and cholesterol under good control. Continue to monitor. Call with any concerns.  

## 2024-07-03 NOTE — Progress Notes (Signed)
 Subjective:   Tonya Cabrera is a 73 y.o. female who presents for Medicare Annual (Subsequent) preventive examination.  Visit Complete: In person  Patient Medicare AWV questionnaire was completed by the patient on 07/03/24; I have confirmed that all information answered by patient is correct and no changes since this date.        Objective:    Today's Vitals   07/03/24 1347  BP: 123/74  Pulse: 60  Temp: 97.7 F (36.5 C)  TempSrc: Oral  SpO2: 97%  Weight: 143 lb (64.9 kg)  Height: 5' 4 (1.626 m)   Body mass index is 24.55 kg/m.     07/03/2024    2:04 PM 06/17/2021    6:15 PM 02/06/2021   11:25 AM 10/06/2018    7:41 AM 06/08/2018    8:29 AM 02/19/2018   12:15 PM 11/23/2017    7:17 AM  Advanced Directives  Does Patient Have a Medical Advance Directive? No No No No  No  No  No   Does patient want to make changes to medical advance directive?  No - Patient declined       Would patient like information on creating a medical advance directive? No - Patient declined No - Patient declined  No - Patient declined   No - Patient declined  No - Patient declined      Data saved with a previous flowsheet row definition    Current Medications (verified) Outpatient Encounter Medications as of 07/03/2024  Medication Sig   alendronate  (FOSAMAX ) 70 MG tablet TAKE 1 TABLET BY MOUTH EVERY SUNDAY. TAKE WITH A FULL GLASS OF WATER ON AN EMPTY STOMACH.   aspirin  EC 81 MG tablet Take 1 tablet (81 mg total) by mouth daily. TAKE 1 TABLET (81 MG TOTAL) BY MOUTH DAILY.   clobetasol  ointment (TEMOVATE ) 0.05 % APPLY 1 APPLICATION TOPICALLY DAILY   furosemide  (LASIX ) 20 MG tablet Take 20 mg by mouth daily.   nitroGLYCERIN (NITROSTAT) 0.4 MG SL tablet Place 0.4 mg under the tongue every 5 (five) minutes as needed for chest pain.   [DISCONTINUED] atorvastatin  (LIPITOR) 80 MG tablet Take 1 tablet (80 mg total) by mouth daily.   [DISCONTINUED] metoprolol  tartrate (LOPRESSOR ) 25 MG tablet TAKE 1 TABLET BY  MOUTH EVERY 12 HOURS   atorvastatin  (LIPITOR) 80 MG tablet Take 1 tablet (80 mg total) by mouth daily.   metoprolol  tartrate (LOPRESSOR ) 25 MG tablet TAKE 1 TABLET BY MOUTH EVERY 12 HOURS   No facility-administered encounter medications on file as of 07/03/2024.    Allergies (verified) Patient has no known allergies.   History: Past Medical History:  Diagnosis Date   Acute gastric ulcer without hemorrhage or perforation    Acute osteomyelitis of hand (HCC) 03/01/2015   Acute posthemorrhagic anemia    AKI (acute kidney injury) 02/28/2015   Arthritis    Benign neoplasm of cecum    Coronary artery disease    coronary stent   Dyspnea    Gastritis, Helicobacter pylori    Hard of hearing    Heart murmur    Hypercholesteremia    Hypertension    Myocardial infarction [facility])    2015   Osteoporosis    Peptic ulcer    Special screening for malignant neoplasms, colon    Stomach irritation    Stroke (HCC)    10-12 years ago   Past Surgical History:  Procedure Laterality Date   CARDIAC SURGERY  05/09/2021   CAROTID STENT  CATARACT EXTRACTION W/PHACO Right 11/09/2017   Procedure: CATARACT EXTRACTION PHACO AND INTRAOCULAR LENS PLACEMENT (IOC) RIGHT;  Surgeon: Myrna Adine Anes, MD;  Location: [facility];  Service: Ophthalmology;  Laterality: Right;   CATARACT EXTRACTION W/PHACO Left 11/23/2017   Procedure: CATARACT EXTRACTION PHACO AND INTRAOCULAR LENS PLACEMENT (IOC) LEFT;  Surgeon: Myrna Adine Anes, MD;  Location: [facility];  Service: Ophthalmology;  Laterality: Left;   COLONOSCOPY WITH PROPOFOL  N/A 10/06/2018   Procedure: COLONOSCOPY WITH PROPOFOL ;  Surgeon: Janalyn Keene NOVAK, MD;  Location: [facility] END[facility]COPY;  Service: Endoscopy;  Laterality: N/A;   ESOPHAGOGASTRODUODENOSCOPY (EGD) WITH PROPOFOL  N/A 02/22/2018   Procedure: ESOPHAGOGASTRODUODENOSCOPY (EGD) WITH PROPOFOL ;  Surgeon: Janalyn Keene NOVAK, MD;  Location: [facility];  Service: Endoscopy;   Laterality: N/A;   ESOPHAGOGASTRODUODENOSCOPY (EGD) WITH PROPOFOL  N/A 06/08/2018   Procedure: ESOPHAGOGASTRODUODENOSCOPY (EGD) WITH PROPOFOL ;  Surgeon: Janalyn Keene NOVAK, MD;  Location: [facility];  Service: Endoscopy;  Laterality: N/A;   ESOPHAGOGASTRODUODENOSCOPY (EGD) WITH PROPOFOL  N/A 10/06/2018   Procedure: ESOPHAGOGASTRODUODENOSCOPY (EGD) WITH PROPOFOL ;  Surgeon: Janalyn Keene NOVAK, MD;  Location: [facility];  Service: Endoscopy;  Laterality: N/A;   INCONTINENCE SURGERY     LEFT HEART CATH AND CORONARY ANGIOGRAPHY N/A 02/06/2021   Procedure: LEFT HEART CATH AND CORONARY ANGIOGRAPHY;  Surgeon: Florencio Cara BIRCH, MD;  Location: [facility];  Service: Cardiovascular;  Laterality: N/A;   THYROID  SURGERY     Family History  Problem Relation Age of Onset   Heart Problems Mother    Liver cancer Brother    Diabetes Daughter    Breast cancer Neg Hx    Social History   Socioeconomic History   Marital status: Divorced    Spouse name: Not on file   Number of children: Not on file   Years of education: Not on file   Highest education level: Some college, no degree  Occupational History   Occupation: retired   Occupation: part Scientist, research (medical)  Tobacco Use   Smoking status: Former    Current packs/day: 0.00    Average packs/day: 1.5 packs/day for 22.0 years (33.0 ttl pk-yrs)    Types: Cigarettes    Start date: 05/08/1992    Quit date: 05/08/2014    Years since quitting: 10.1   Smokeless tobacco: Never   Tobacco comments:    Smoked over 25 years, quit after Heart Attack in 2015.    Vaping Use   Vaping status: Never Used  Substance and Sexual Activity   Alcohol use: No    Alcohol/week: 0.0 standard drinks of alcohol   Drug use: No   Sexual activity: Not Currently  Other Topics Concern   Not on file  Social History Narrative   Not on file   Social Drivers of Health   Financial Resource Strain: Medium Risk (12/17/2023)   Overall Financial Resource Strain  (CARDIA)    Difficulty of Paying Living Expenses: Somewhat hard  Food Insecurity: Food Insecurity Present (07/03/2024)   Hunger Vital Sign    Worried About Running Out of Food in the Last Year: Sometimes true    Ran Out of Food in the Last Year: Never true  Transportation Needs: No Transportation Needs (07/03/2024)   PRAPARE - Administrator, Civil Service (Medical): No    Lack of Transportation (Non-Medical): No  Physical Activity: Sufficiently Active (12/17/2023)   Exercise Vital Sign    Days of Exercise per Week: 7 days    Minutes of Exercise per Session: 60 min  Stress:  Stress Concern Present (12/17/2023)   [employer] of Occupational Health - Occupational Stress Questionnaire    Feeling of Stress : Very much  Social Connections: Socially Isolated (12/17/2023)   Social Connection and Isolation Panel    Frequency of Communication with Friends and Family: More than three times a week    Frequency of Social Gatherings with Friends and Family: Twice a week    Attends Religious Services: Never    Database administrator or Organizations: No    Attends Engineer, structural: Not on file    Marital Status: Divorced    Tobacco Counseling Counseling given: Not Answered Tobacco comments: Smoked over 25 years, quit after Heart Attack in 2015.     Clinical Intake:                        Activities of Daily Living    07/03/2024    2:02 PM  In your present state of health, do you have any difficulty performing the following activities:  Hearing? 1  Vision? 0  Difficulty concentrating or making decisions? 1  Walking or climbing stairs? 0  Dressing or bathing? 0  Doing errands, shopping? 0    Patient Care Team: Vicci Duwaine SQUIBB, DO as PCP - General (Family Medicine) Florencio Cara BIRCH, MD as Consulting Physician (Cardiology) Pa, [facility] (Optometry)  Indicate any recent Medical Services you may have received from other than [facility]  providers in the past year (date may be approximate).     Assessment:   This is a routine wellness examination for Tonya Cabrera.  Hearing/Vision screen No results found.   Goals Addressed   None    Depression Screen    07/03/2024    2:02 PM 12/21/2023    1:29 PM 06/28/2023    1:55 PM 01/21/2023    2:15 PM 12/24/2022    2:04 PM 06/25/2022    1:16 PM 12/22/2021   11:10 AM  PHQ 2/9 Scores  PHQ - 2 Score 1 0 0 0 0 0 0  PHQ- 9 Score  3 3 6 3 3 3     Fall Risk    07/03/2024    2:02 PM 12/21/2023    1:29 PM 06/28/2023    1:55 PM 01/21/2023    2:15 PM 12/24/2022    2:04 PM  Fall Risk   Falls in the past year? 0 0 0 0 0  Number falls in past yr: 0 0 0 0 0  Injury with Fall? 0 0 0 0 0  Risk for fall due to : No Fall Risks No Fall Risks No Fall Risks No Fall Risks No Fall Risks  Follow up  Falls evaluation completed Falls evaluation completed Falls evaluation completed Falls evaluation completed    MEDICARE RISK AT HOME:    TIMED UP AND GO:  Was the test performed?  Yes  Length of time to ambulate 10 feet: 12 sec Gait steady and fast without use of assistive device    Cognitive Function:        07/03/2024    2:03 PM 06/28/2023    2:13 PM 06/17/2021    6:17 PM 05/10/2020   10:42 AM 05/02/2019    3:30 PM  6CIT Screen  What Year? 0 points 0 points 0 points 0 points 0 points  What month? 0 points 0 points 0 points 0 points 0 points  What time? 0 points 0 points 0 points 0 points 0 points  Count back from 20 0 points 0 points 0 points 0 points 0 points  Months in reverse 0 points 0 points 0 points 0 points 0 points  Repeat phrase 0 points 0 points 0 points 0 points 0 points  Total Score 0 points 0 points 0 points 0 points 0 points    Immunizations Immunization History  Administered Date(s) Administered   Moderna Sars-Covid-2 Vaccination 04/27/2020, 05/25/2020   Pneumococcal Polysaccharide-23 05/05/2013   Tdap 05/05/2013    Tdap refused  Flu Vaccine status: Declined, Education  has been provided regarding the importance of this vaccine but patient still declined. Advised may receive this vaccine at local pharmacy or Health Dept. Aware to provide a copy of the vaccination record if obtained from local pharmacy or Health Dept. Verbalized acceptance and understanding.  Pneumococcal vaccine status: Declined,  Education has been provided regarding the importance of this vaccine but patient still declined. Advised may receive this vaccine at local pharmacy or Health Dept. Aware to provide a copy of the vaccination record if obtained from local pharmacy or Health Dept. Verbalized acceptance and understanding.   Covid-19 vaccine status: Declined, Education has been provided regarding the importance of this vaccine but patient still declined. Advised may receive this vaccine at local pharmacy or Health Dept.or vaccine clinic. Aware to provide a copy of the vaccination record if obtained from local pharmacy or Health Dept. Verbalized acceptance and understanding.  Qualifies for Shingles Vaccine? Yes   Zostavax completed No   Shingrix Completed?: No.    Education has been provided regarding the importance of this vaccine. Patient has been advised to call insurance company to determine out of pocket expense if they have not yet received this vaccine. Advised may also receive vaccine at local pharmacy or Health Dept. Verbalized acceptance and understanding.  Screening Tests Health Maintenance  Topic Date Due   Zoster Vaccines- Shingrix (1 of 2) 10/03/2024 (Originally 04/28/1970)   Influenza Vaccine  12/26/2024 (Originally 04/28/2024)   Pneumococcal Vaccine: 50+ Years (2 of 2 - PCV) 07/03/2025 (Originally 05/05/2014)   Lung Cancer Screening  12/26/2024   DEXA SCAN  02/09/2025   Medicare Annual Wellness (AWV)  07/03/2025   Mammogram  07/05/2025   Fecal DNA (Cologuard)  12/27/2026   Hepatitis C Screening  Completed   Meningococcal B Vaccine  Aged Out   DTaP/Tdap/Td  Discontinued    Colonoscopy  Discontinued   COVID-19 Vaccine  Discontinued    Health Maintenance  There are no preventive care reminders to display for this patient.  Due for cologuard- has one at home, will do  Mammogram status: Ordered today. Pt provided with contact info and advised to call to schedule appt.   Bone Density status: Completed 02/10/23. Results reflect: Bone density results: OSTEOPOROSIS. Repeat every 2 years.  Lung Cancer Screening: (Low Dose CT Chest recommended if Age 36-80 years, 20 pack-year currently smoking OR have quit w/in 15years.) does not qualify.   Additional Screening:  Hepatitis C Screening: does qualify; Completed 06/16/16  Vision Screening: Recommended annual ophthalmology exams for early detection of glaucoma and other disorders of the eye.  Dental Screening: Recommended annual dental exams for proper oral hygiene  Community Resource Referral / Chronic Care Management: CRR required this visit?  No   CCM required this visit?  No     Plan:     I have personally reviewed and noted the following in the patient's chart:   Medical and social history Use of alcohol, tobacco or illicit drugs  Current medications and supplements including opioid prescriptions. Patient is not currently taking opioid prescriptions. Functional ability and status Nutritional status Physical activity Advanced directives List of other physicians Hospitalizations, surgeries, and ER visits in previous 12 months Vitals Screenings to include cognitive, depression, and falls Referrals and appointments  In addition, I have reviewed and discussed with patient certain preventive protocols, quality metrics, and best practice recommendations. A written personalized care plan for preventive services as well as general preventive health recommendations were provided to patient.     Duwaine Louder, DO   07/03/2024   After Visit Summary: (In Person-Printed) AVS printed and given to the  patient

## 2024-07-03 NOTE — Progress Notes (Signed)
 BP 123/74   Pulse 60   Temp 97.7 F (36.5 C) (Oral)   Ht 5' 4 (1.626 m)   Wt 143 lb (64.9 kg)   SpO2 97%   BMI 24.55 kg/m    Subjective:    Patient ID: Tonya Cabrera, female    DOB: [birth year], 73 y.o.   MRN: [id number]  HPI: Tonya Cabrera is a 73 y.o. female presenting on 07/03/2024 for comprehensive medical examination. Current medical complaints include:  HYPERTENSION / HYPERLIPIDEMIA Satisfied with current treatment? yes Duration of hypertension: chronic BP monitoring frequency: not checking BP medication side effects: no Past BP meds: metoprolol , lasix  Duration of hyperlipidemia: chronic Cholesterol medication side effects: no Cholesterol supplements: none Past cholesterol medications: atorvastatin  Medication compliance: excellent compliance Aspirin : yes Recent stressors: no Recurrent headaches: no Visual changes: no Palpitations: no Dyspnea: no Chest pain: no Lower extremity edema: no Dizzy/lightheaded: no   Menopausal Symptoms: no  Functional Status Survey: Is the patient deaf or have difficulty hearing?: Yes Does the patient have difficulty seeing, even when wearing glasses/contacts?: No Does the patient have difficulty concentrating, remembering, or making decisions?: Yes Does the patient have difficulty walking or climbing stairs?: No Does the patient have difficulty dressing or bathing?: No Does the patient have difficulty doing errands alone such as visiting a doctor's office or shopping?: No     07/03/2024    2:02 PM 12/21/2023    1:29 PM 06/28/2023    1:55 PM 01/21/2023    2:15 PM 12/24/2022    2:04 PM  Fall Risk   Falls in the past year? 0 0 0 0 0  Number falls in past yr: 0 0 0 0 0  Injury with Fall? 0 0 0 0 0  Risk for fall due to : No Fall Risks No Fall Risks No Fall Risks No Fall Risks No Fall Risks  Follow up  Falls evaluation completed Falls evaluation completed Falls evaluation completed Falls evaluation completed    Depression  Screen    07/03/2024    2:02 PM 12/21/2023    1:29 PM 06/28/2023    1:55 PM 01/21/2023    2:15 PM 12/24/2022    2:04 PM  Depression screen PHQ 2/9  Decreased Interest 0 0 0 0 0  Down, Depressed, Hopeless 1 0 0 0 0  PHQ - 2 Score 1 0 0 0 0  Altered sleeping  3 3 3 3   Tired, decreased energy  0 0 3 0  Change in appetite  0 0 0 0  Feeling bad or failure about yourself   0 0 0 0  Trouble concentrating  0 0 0 0  Moving slowly or fidgety/restless  0 0 0 0  Suicidal thoughts  0 0 0 0  PHQ-9 Score  3 3 6 3   Difficult doing work/chores  Very difficult Somewhat difficult Not difficult at all Not difficult at all     Advanced Directives Does patient have a HCPOA?    no If yes, name and contact information:  Does patient have a living will or MOST form?  no  Past Medical History:  Past Medical History:  Diagnosis Date   Acute gastric ulcer without hemorrhage or perforation    Acute osteomyelitis of hand (HCC) 03/01/2015   Acute posthemorrhagic anemia    AKI (acute kidney injury) 02/28/2015   Arthritis    Benign neoplasm of cecum    Coronary artery disease    coronary stent   Dyspnea  Gastritis, Helicobacter pylori    Hard of hearing    Heart murmur    Hypercholesteremia    Hypertension    Myocardial infarction [facility])    2015   Osteoporosis    Peptic ulcer    Special screening for malignant neoplasms, colon    Stomach irritation    Stroke (HCC)    10-12 years ago    Surgical History:  Past Surgical History:  Procedure Laterality Date   CARDIAC SURGERY  05/09/2021   CAROTID STENT     CATARACT EXTRACTION W/PHACO Right 11/09/2017   Procedure: CATARACT EXTRACTION PHACO AND INTRAOCULAR LENS PLACEMENT (IOC) RIGHT;  Surgeon: Myrna Adine Anes, MD;  Location: [facility];  Service: Ophthalmology;  Laterality: Right;   CATARACT EXTRACTION W/PHACO Left 11/23/2017   Procedure: CATARACT EXTRACTION PHACO AND INTRAOCULAR LENS PLACEMENT (IOC) LEFT;  Surgeon: Myrna Adine Anes,  MD;  Location: [facility];  Service: Ophthalmology;  Laterality: Left;   COLONOSCOPY WITH PROPOFOL  N/A 10/06/2018   Procedure: COLONOSCOPY WITH PROPOFOL ;  Surgeon: Janalyn Keene NOVAK, MD;  Location: [facility] ENDOSCOPY;  Service: Endoscopy;  Laterality: N/A;   ESOPHAGOGASTRODUODENOSCOPY (EGD) WITH PROPOFOL  N/A 02/22/2018   Procedure: ESOPHAGOGASTRODUODENOSCOPY (EGD) WITH PROPOFOL ;  Surgeon: Janalyn Keene NOVAK, MD;  Location: [facility] ENDOSCOPY;  Service: Endoscopy;  Laterality: N/A;   ESOPHAGOGASTRODUODENOSCOPY (EGD) WITH PROPOFOL  N/A 06/08/2018   Procedure: ESOPHAGOGASTRODUODENOSCOPY (EGD) WITH PROPOFOL ;  Surgeon: Janalyn Keene NOVAK, MD;  Location: [facility] ENDOSCOPY;  Service: Endoscopy;  Laterality: N/A;   ESOPHAGOGASTRODUODENOSCOPY (EGD) WITH PROPOFOL  N/A 10/06/2018   Procedure: ESOPHAGOGASTRODUODENOSCOPY (EGD) WITH PROPOFOL ;  Surgeon: Janalyn Keene NOVAK, MD;  Location: [facility] ENDOSCOPY;  Service: Endoscopy;  Laterality: N/A;   INCONTINENCE SURGERY     LEFT HEART CATH AND CORONARY ANGIOGRAPHY N/A 02/06/2021   Procedure: LEFT HEART CATH AND CORONARY ANGIOGRAPHY;  Surgeon: Florencio Cara BIRCH, MD;  Location: [facility] INVASIVE CV LAB;  Service: Cardiovascular;  Laterality: N/A;   THYROID  SURGERY      Medications:  Current Outpatient Medications on File Prior to Visit  Medication Sig   alendronate  (FOSAMAX ) 70 MG tablet TAKE 1 TABLET BY MOUTH EVERY SUNDAY. TAKE WITH A FULL GLASS OF WATER ON AN EMPTY STOMACH.   aspirin  EC 81 MG tablet Take 1 tablet (81 mg total) by mouth daily. TAKE 1 TABLET (81 MG TOTAL) BY MOUTH DAILY.   clobetasol  ointment (TEMOVATE ) 0.05 % APPLY 1 APPLICATION TOPICALLY DAILY   furosemide  (LASIX ) 20 MG tablet Take 20 mg by mouth daily.   nitroGLYCERIN (NITROSTAT) 0.4 MG SL tablet Place 0.4 mg under the tongue every 5 (five) minutes as needed for chest pain.   No current facility-administered medications on file prior to visit.    Allergies:  No Known Allergies  Social  History:  Social History   Socioeconomic History   Marital status: Divorced    Spouse name: Not on file   Number of children: Not on file   Years of education: Not on file   Highest education level: Some college, no degree  Occupational History   Occupation: retired   Occupation: part Scientist, research (medical)  Tobacco Use   Smoking status: Former    Current packs/day: 0.00    Average packs/day: 1.5 packs/day for 22.0 years (33.0 ttl pk-yrs)    Types: Cigarettes    Start date: 05/08/1992    Quit date: 05/08/2014    Years since quitting: 10.1   Smokeless tobacco: Never   Tobacco comments:    Smoked over 25 years, quit after Heart Attack  in 2015.    Vaping Use   Vaping status: Never Used  Substance and Sexual Activity   Alcohol use: No    Alcohol/week: 0.0 standard drinks of alcohol   Drug use: No   Sexual activity: Not Currently  Other Topics Concern   Not on file  Social History Narrative   Not on file   Social Drivers of Health   Financial Resource Strain: Medium Risk (12/17/2023)   Overall Financial Resource Strain (CARDIA)    Difficulty of Paying Living Expenses: Somewhat hard  Food Insecurity: Food Insecurity Present (12/17/2023)   Hunger Vital Sign    Worried About Running Out of Food in the Last Year: Sometimes true    Ran Out of Food in the Last Year: Never true  Transportation Needs: No Transportation Needs (12/17/2023)   PRAPARE - Administrator, Civil Service (Medical): No    Lack of Transportation (Non-Medical): No  Physical Activity: Sufficiently Active (12/17/2023)   Exercise Vital Sign    Days of Exercise per Week: 7 days    Minutes of Exercise per Session: 60 min  Stress: Stress Concern Present (12/17/2023)   [employer] of Occupational Health - Occupational Stress Questionnaire    Feeling of Stress : Very much  Social Connections: Socially Isolated (12/17/2023)   Social Connection and Isolation Panel    Frequency of Communication with Friends and  Family: More than three times a week    Frequency of Social Gatherings with Friends and Family: Twice a week    Attends Religious Services: Never    Database administrator or Organizations: No    Attends Engineer, structural: Not on file    Marital Status: Divorced  Intimate Partner Violence: Not At Risk (06/17/2021)   Humiliation, Afraid, Rape, and Kick questionnaire    Fear of Current or Ex-Partner: No    Emotionally Abused: No    Physically Abused: No    Sexually Abused: No   Social History   Tobacco Use  Smoking Status Former   Current packs/day: 0.00   Average packs/day: 1.5 packs/day for 22.0 years (33.0 ttl pk-yrs)   Types: Cigarettes   Start date: 05/08/1992   Quit date: 05/08/2014   Years since quitting: 10.1  Smokeless Tobacco Never  Tobacco Comments   Smoked over 25 years, quit after Heart Attack in 2015.     Social History   Substance and Sexual Activity  Alcohol Use No   Alcohol/week: 0.0 standard drinks of alcohol    Family History:  Family History  Problem Relation Age of Onset   Heart Problems Mother    Liver cancer Brother    Diabetes Daughter    Breast cancer Neg Hx     Past medical history, surgical history, medications, allergies, family history and social history reviewed with patient today and changes made to appropriate areas of the chart.   Review of Systems  Constitutional: Negative.   HENT: Negative.    Eyes: Negative.   Respiratory: Negative.    Cardiovascular: Negative.   Gastrointestinal: Negative.   Genitourinary: Negative.   Musculoskeletal:  Positive for back pain and myalgias. Negative for falls, joint pain and neck pain.  Skin: Negative.   Neurological: Negative.   Endo/Heme/Allergies: Negative.   Psychiatric/Behavioral: Negative.      All other ROS negative except what is listed above and in the HPI.      Objective:    BP 123/74   Pulse 60   Temp 97.7  F (36.5 C) (Oral)   Ht 5' 4 (1.626 m)   Wt 143 lb  (64.9 kg)   SpO2 97%   BMI 24.55 kg/m   Wt Readings from Last 3 Encounters:  07/03/24 143 lb (64.9 kg)  12/21/23 146 lb 9.6 oz (66.5 kg)  06/28/23 142 lb 12.8 oz (64.8 kg)     Physical Exam Vitals and nursing note reviewed.  Constitutional:      General: She is not in acute distress.    Appearance: Normal appearance. She is normal weight. She is not ill-appearing, toxic-appearing or diaphoretic.  HENT:     Head: Normocephalic and atraumatic.     Right Ear: Tympanic membrane, ear canal and external ear normal. There is no impacted cerumen.     Left Ear: Tympanic membrane, ear canal and external ear normal. There is no impacted cerumen.     Nose: Nose normal. No congestion or rhinorrhea.     Mouth/Throat:     Mouth: Mucous membranes are moist.     Pharynx: Oropharynx is clear. No oropharyngeal exudate or posterior oropharyngeal erythema.  Eyes:     General: No scleral icterus.       Right eye: No discharge.        Left eye: No discharge.     Extraocular Movements: Extraocular movements intact.     Conjunctiva/sclera: Conjunctivae normal.     Pupils: Pupils are equal, round, and reactive to light.  Neck:     Vascular: No carotid bruit.  Cardiovascular:     Rate and Rhythm: Normal rate and regular rhythm.     Pulses: Normal pulses.     Heart sounds: No murmur heard.    No friction rub. No gallop.  Pulmonary:     Effort: Pulmonary effort is normal. No respiratory distress.     Breath sounds: Normal breath sounds. No stridor. No wheezing, rhonchi or rales.  Chest:     Chest wall: No tenderness.  Abdominal:     General: Abdomen is flat. Bowel sounds are normal. There is no distension.     Palpations: Abdomen is soft. There is no mass.     Tenderness: There is no abdominal tenderness. There is no right CVA tenderness, left CVA tenderness, guarding or rebound.     Hernia: No hernia is present.  Genitourinary:    Comments: Breast and pelvic exams deferred with shared decision  making Musculoskeletal:        General: No swelling, tenderness, deformity or signs of injury.     Cervical back: Normal range of motion and neck supple. No rigidity. No muscular tenderness.     Right lower leg: No edema.     Left lower leg: No edema.  Lymphadenopathy:     Cervical: No cervical adenopathy.  Skin:    General: Skin is warm and dry.     Capillary Refill: Capillary refill takes less than 2 seconds.     Coloration: Skin is not jaundiced or pale.     Findings: No bruising, erythema, lesion or rash.  Neurological:     General: No focal deficit present.     Mental Status: She is alert and oriented to person, place, and time. Mental status is at baseline.     Cranial Nerves: No cranial nerve deficit.     Sensory: No sensory deficit.     Motor: No weakness.     Coordination: Coordination normal.     Gait: Gait normal.     Deep Tendon Reflexes:  Reflexes normal.  Psychiatric:        Mood and Affect: Mood normal.        Behavior: Behavior normal.        Thought Content: Thought content normal.        Judgment: Judgment normal.        07/03/2024    2:03 PM 06/28/2023    2:13 PM 06/17/2021    6:17 PM 05/10/2020   10:42 AM 05/02/2019    3:30 PM  6CIT Screen  What Year? 0 points 0 points 0 points 0 points 0 points  What month? 0 points 0 points 0 points 0 points 0 points  What time? 0 points 0 points 0 points 0 points 0 points  Count back from 20 0 points 0 points 0 points 0 points 0 points  Months in reverse 0 points 0 points 0 points 0 points 0 points  Repeat phrase 0 points 0 points 0 points 0 points 0 points  Total Score 0 points 0 points 0 points 0 points 0 points    Results for orders placed or performed in visit on 12/21/23  CBC with Differential/Platelet   Collection Time: 12/21/23  1:32 PM  Result Value Ref Range   WBC 7.7 3.4 - 10.8 x10E3/uL   RBC 3.94 3.77 - 5.28 x10E6/uL   Hemoglobin 12.3 11.1 - 15.9 g/dL   Hematocrit 62.8 65.9 - 46.6 %   MCV 94 79 - 97 fL    MCH 31.2 26.6 - 33.0 pg   MCHC 33.2 31.5 - 35.7 g/dL   RDW 86.6 88.2 - 84.5 %   Platelets 256 150 - 450 x10E3/uL   Neutrophils 63 Not Estab. %   Lymphs 27 Not Estab. %   Monocytes 7 Not Estab. %   Eos 3 Not Estab. %   Basos 0 Not Estab. %   Neutrophils Absolute 4.8 1.4 - 7.0 x10E3/uL   Lymphocytes Absolute 2.1 0.7 - 3.1 x10E3/uL   Monocytes Absolute 0.5 0.1 - 0.9 x10E3/uL   EOS (ABSOLUTE) 0.2 0.0 - 0.4 x10E3/uL   Basophils Absolute 0.0 0.0 - 0.2 x10E3/uL   Immature Granulocytes 0 Not Estab. %   Immature Grans (Abs) 0.0 0.0 - 0.1 x10E3/uL  Lipid Panel w/o Chol/HDL Ratio   Collection Time: 12/21/23  1:32 PM  Result Value Ref Range   Cholesterol, Total 173 100 - 199 mg/dL   Triglycerides 841 (H) 0 - 149 mg/dL   HDL 55 >60 mg/dL   VLDL Cholesterol Cal 27 5 - 40 mg/dL   LDL Chol Calc (NIH) 91 0 - 99 mg/dL  Comprehensive metabolic panel   Collection Time: 12/21/23  1:32 PM  Result Value Ref Range   Glucose 84 70 - 99 mg/dL   BUN 14 8 - 27 mg/dL   Creatinine, Ser 9.08 0.57 - 1.00 mg/dL   eGFR 67 >40 fO/fpw/8.26   BUN/Creatinine Ratio 15 12 - 28   Sodium 143 134 - 144 mmol/L   Potassium 4.2 3.5 - 5.2 mmol/L   Chloride 106 96 - 106 mmol/L   CO2 22 20 - 29 mmol/L   Calcium  9.3 8.7 - 10.3 mg/dL   Total Protein 6.4 6.0 - 8.5 g/dL   Albumin 4.3 3.8 - 4.8 g/dL   Globulin, Total 2.1 1.5 - 4.5 g/dL   Bilirubin Total 1.6 (H) 0.0 - 1.2 mg/dL   Alkaline Phosphatase 113 44 - 121 IU/L   AST 16 0 - 40 IU/L   ALT 18 0 -  32 IU/L  Cologuard   Collection Time: 12/27/23 11:00 AM  Result Value Ref Range   COLOGUARD Negative Negative      Assessment & Plan:   Problem List Items Addressed This Visit       Cardiovascular and Mediastinum   CAD (coronary artery disease)   Will keep her BP and cholesterol under good control. Continue to monitor. Call with any concerns.       Relevant Medications   atorvastatin  (LIPITOR) 80 MG tablet   metoprolol  tartrate (LOPRESSOR ) 25 MG tablet    Other Relevant Orders   CBC with Differential/Platelet   Comprehensive metabolic panel with GFR   Aortic atherosclerosis   Will keep her BP and cholesterol under good control. Continue to monitor. Call with any concerns.       Relevant Medications   atorvastatin  (LIPITOR) 80 MG tablet   metoprolol  tartrate (LOPRESSOR ) 25 MG tablet   Other Relevant Orders   CBC with Differential/Platelet   Comprehensive metabolic panel with GFR   Senile purpura   Reassured patient. Continue to monitor.       Relevant Medications   atorvastatin  (LIPITOR) 80 MG tablet   metoprolol  tartrate (LOPRESSOR ) 25 MG tablet   Other Relevant Orders   CBC with Differential/Platelet   Comprehensive metabolic panel with GFR     Genitourinary   Benign hypertensive renal disease   Under good control on current regimen. Continue current regimen. Continue to monitor. Call with any concerns. Refills given. Labs drawn today.        Relevant Orders   CBC with Differential/Platelet   Comprehensive metabolic panel with GFR   TSH   Microalbumin, Urine Waived     Other   Hyperlipidemia   Under good control on current regimen. Continue current regimen. Continue to monitor. Call with any concerns. Refills given. Labs drawn today.       Relevant Medications   atorvastatin  (LIPITOR) 80 MG tablet   metoprolol  tartrate (LOPRESSOR ) 25 MG tablet   Other Relevant Orders   CBC with Differential/Platelet   Comprehensive metabolic panel with GFR   Lipid Panel w/o Chol/HDL Ratio   Depression, major, recurrent, in remission   Doing well not on medicine. Continue to monitor. Call with any concerns.       Other Visit Diagnoses       Encounter for annual wellness visit (AWV) in Medicare patient    -  Primary   Preventative care discussed today as below.     Routine general medical examination at a health care facility       Vaccines up to date/declined. Screening labs checked today. Mammo ordered. DEXA and cologuard up  to date. Continue diet and exercise. Call with any concerns.     Encounter for screening mammogram for malignant neoplasm of breast       Mammo ordered today.   Relevant Orders   MM 3D SCREENING MAMMOGRAM BILATERAL BREAST        Preventative Services:  Health Risk Assessment and Personalized Prevention Plan: Done today Bone Mass Measurements: Up to date Breast Cancer Screening: Ordered today CVD Screening: Done today Cervical Cancer Screening: N/A Colon Cancer Screening: up to date Depression Screening: done today Diabetes Screening: done today Glaucoma Screening: See your eye doctor Hepatitis B vaccine: N/A Hepatitis C screening: up to date HIV Screening: up to date Flu Vaccine: declined Lung cancer Screening: N/A Obesity Screening: done today Pneumonia Vaccines (2): declined STI Screening: N/A  Follow up plan: Return in about  6 months (around 01/01/2025).   LABORATORY TESTING:  - Pap smear: not applicable  IMMUNIZATIONS:   - Tdap: Tetanus vaccination status reviewed: last tetanus booster within 10 years. - Influenza: Refused - Pneumovax: Refused - Prevnar: Refused - Zostavax vaccine: Refused  SCREENING: -Mammogram: Ordered today  - Colonoscopy: Up to date  - Bone Density: Up to date   PATIENT COUNSELING:   Advised to take 1 mg of folate supplement per day if capable of pregnancy.   Sexuality: Discussed sexually transmitted diseases, partner selection, use of condoms, avoidance of unintended pregnancy  and contraceptive alternatives.   Advised to avoid cigarette smoking.  I discussed with the patient that most people either abstain from alcohol or drink within safe limits (<=14/week and <=4 drinks/occasion for males, <=7/weeks and <= 3 drinks/occasion for females) and that the risk for alcohol disorders and other health effects rises proportionally with the number of drinks per week and how often a drinker exceeds daily limits.  Discussed cessation/primary  prevention of drug use and availability of treatment for abuse.   Diet: Encouraged to adjust caloric intake to maintain  or achieve ideal body weight, to reduce intake of dietary saturated fat and total fat, to limit sodium intake by avoiding high sodium foods and not adding table salt, and to maintain adequate dietary potassium and calcium  preferably from fresh fruits, vegetables, and low-fat dairy products.    stressed the importance of regular exercise  Injury prevention: Discussed safety belts, safety helmets, smoke detector, smoking near bedding or upholstery.   Dental health: Discussed importance of regular tooth brushing, flossing, and dental visits.    NEXT PREVENTATIVE PHYSICAL DUE IN 1 YEAR. Return in about 6 months (around 01/01/2025).

## 2024-07-03 NOTE — Assessment & Plan Note (Signed)
 Under good control on current regimen. Continue current regimen. Continue to monitor. Call with any concerns. Refills given. Labs drawn today.

## 2024-07-03 NOTE — Patient Instructions (Signed)
  Preventative Services:  Health Risk Assessment and Personalized Prevention Plan: Done today Bone Mass Measurements: Up to date Breast Cancer Screening: Ordered today CVD Screening: Done today Cervical Cancer Screening: N/A Colon Cancer Screening: up to date Depression Screening: done today Diabetes Screening: done today Glaucoma Screening: See your eye doctor Hepatitis B vaccine: N/A Hepatitis C screening: up to date HIV Screening: up to date Flu Vaccine: declined Lung cancer Screening: N/A Obesity Screening: done today Pneumonia Vaccines (2): declined STI Screening: N/A

## 2024-07-03 NOTE — Assessment & Plan Note (Signed)
Doing well not on medicine. Continue to monitor. Call with any concerns.

## 2024-07-04 ENCOUNTER — Ambulatory Visit: Payer: Self-pay | Admitting: Family Medicine

## 2024-07-04 LAB — COMPREHENSIVE METABOLIC PANEL WITH GFR
ALT: 18 IU/L (ref 0–32)
AST: 27 IU/L (ref 0–40)
Albumin: 4 g/dL (ref 3.8–4.8)
Alkaline Phosphatase: 126 IU/L (ref 49–135)
BUN/Creatinine Ratio: 18 (ref 12–28)
BUN: 15 mg/dL (ref 8–27)
Bilirubin Total: 1.4 mg/dL — ABNORMAL HIGH (ref 0.0–1.2)
CO2: 20 mmol/L (ref 20–29)
Calcium: 9.2 mg/dL (ref 8.7–10.3)
Chloride: 107 mmol/L — ABNORMAL HIGH (ref 96–106)
Creatinine, Ser: 0.85 mg/dL (ref 0.57–1.00)
Globulin, Total: 2.4 g/dL (ref 1.5–4.5)
Glucose: 86 mg/dL (ref 70–99)
Potassium: 3.7 mmol/L (ref 3.5–5.2)
Sodium: 142 mmol/L (ref 134–144)
Total Protein: 6.4 g/dL (ref 6.0–8.5)
eGFR: 72 mL/min/1.73 (ref >59)

## 2024-07-04 LAB — CBC WITH DIFFERENTIAL/PLATELET
Basophils Absolute: 0 x10E3/uL (ref 0.0–0.2)
Basos: 0 %
EOS (ABSOLUTE): 0.2 x10E3/uL (ref 0.0–0.4)
Eos: 2 %
Hematocrit: 36.4 % (ref 34.0–46.6)
Hemoglobin: 11.9 g/dL (ref 11.1–15.9)
Immature Grans (Abs): 0.1 x10E3/uL (ref 0.0–0.1)
Immature Granulocytes: 1 %
Lymphocytes Absolute: 1.9 x10E3/uL (ref 0.7–3.1)
Lymphs: 19 %
MCH: 30.1 pg (ref 26.6–33.0)
MCHC: 32.7 g/dL (ref 31.5–35.7)
MCV: 92 fL (ref 79–97)
Monocytes Absolute: 0.6 x10E3/uL (ref 0.1–0.9)
Monocytes: 6 %
Neutrophils Absolute: 7.3 x10E3/uL — ABNORMAL HIGH (ref 1.4–7.0)
Neutrophils: 72 %
Platelets: 292 x10E3/uL (ref 150–450)
RBC: 3.95 x10E6/uL (ref 3.77–5.28)
RDW: 13.3 % (ref 11.7–15.4)
WBC: 10.1 x10E3/uL (ref 3.4–10.8)

## 2024-07-04 LAB — LIPID PANEL W/O CHOL/HDL RATIO
Cholesterol, Total: 148 mg/dL (ref 100–199)
HDL: 50 mg/dL (ref >39)
LDL Chol Calc (NIH): 78 mg/dL (ref 0–99)
Triglycerides: 107 mg/dL (ref 0–149)
VLDL Cholesterol Cal: 20 mg/dL (ref 5–40)

## 2024-07-04 LAB — TSH: TSH: 1.88 u[IU]/mL (ref 0.450–4.500)

## 2024-07-04 NOTE — Telephone Encounter (Signed)
 Results note has been printed and mailed to the patient

## 2024-07-21 ENCOUNTER — Ambulatory Visit
Admission: RE | Admit: 2024-07-21 | Discharge: 2024-07-21 | Disposition: A | Discharge status: Home / Self Care | Source: Ambulatory Visit | Attending: Family Medicine | Admitting: Family Medicine

## 2024-07-21 DIAGNOSIS — Z1231 Encounter for screening mammogram for malignant neoplasm of breast: Secondary | ICD-10-CM

## 2024-09-05 ENCOUNTER — Ambulatory Visit: Admitting: Emergency Medicine

## 2024-09-05 VITALS — Ht 63.0 in | Wt 145.0 lb

## 2024-09-05 DIAGNOSIS — Z0000 Encounter for general adult medical examination without abnormal findings: Secondary | ICD-10-CM

## 2024-09-05 NOTE — Patient Instructions (Addendum)
 [name],  Thank you for taking the time for your Medicare Wellness Visit. I appreciate your continued commitment to your health goals. Please review the care plan we discussed, and feel free to reach out if I can assist you further.  Please note that Annual Wellness Visits do not include a physical exam. Some assessments may be limited, especially if the visit was conducted virtually. If needed, we may recommend an in-person follow-up with your provider.  Ongoing Care Seeing your primary care provider every 3 to 6 months helps us  monitor your health and provide consistent, personalized care.   Referrals If a referral was made during today's visit and you haven't received any updates within two weeks, please contact the referred provider directly to check on the status.  Recommended Screenings:    Call and schedule a routine eye exam. I have included a list of eye doctors in the area.   You may get a tetanus vaccine at your local pharmacy at your convenience.    Health Maintenance  Topic Date Due   Zoster (Shingles) Vaccine (1 of 2) 10/03/2024*   Flu Shot  12/26/2024*   Pneumococcal Vaccine for age over 55 (2 of 2 - PCV) 07/03/2025*   Screening for Lung Cancer  12/26/2024   Osteoporosis screening with Bone Density Scan  02/09/2025   Medicare Annual Wellness Visit  07/03/2025   Breast Cancer Screening  07/21/2025   Cologuard (Stool DNA test)  12/27/2026   Hepatitis C Screening  Completed   Meningitis B Vaccine  Aged Out   DTaP/Tdap/Td vaccine  Discontinued   Colon Cancer Screening  Discontinued   COVID-19 Vaccine  Discontinued  *Topic was postponed. The date shown is not the original due date.       09/05/2024   10:05 AM  Advanced Directives  Does Patient Have a Medical Advance Directive? No  Would patient like information on creating a medical advance directive? No - Patient declined    Vision: Annual vision screenings are recommended for early detection of glaucoma,  cataracts, and diabetic retinopathy. These exams can also reveal signs of chronic conditions such as diabetes and high blood pressure.  Dental: Annual dental screenings help detect early signs of oral cancer, gum disease, and other conditions linked to overall health, including heart disease and diabetes.  Please see the attached documents for additional preventive care recommendations.   There are several Eye Doctors in your area. Here are a few that usually accept all insurance types:  [facility] [address] [place], [place] [address] Phone: [phone]  [facility] [address] [place], [place] [address] Phone: [phone]  [facility] [address] [place], [place] [address] Phone: [phone]  [facility] [address] [place], [place] [address] Phone: [phone]  [facility] [address] [place], [place] [address] Phone: [phone]  [facility] [address] [place], [place] [address] Phone: [phone]

## 2024-09-05 NOTE — Progress Notes (Signed)
 Chief Complaint  Patient presents with   Medicare Wellness     Subjective:   Tonya Cabrera is a 73 y.o. female who presents for a Medicare Annual Wellness Visit.  Visit info / Clinical Intake: Medicare Wellness Visit Type:: Subsequent Annual Wellness Visit Persons participating in visit and providing information:: patient Medicare Wellness Visit Mode:: Telephone If telephone:: video declined Since this visit was completed virtually, some vitals may be partially provided or unavailable. Missing vitals are due to the limitations of the virtual format.: Documented vitals are patient reported If Telephone or Video please confirm:: I connected with patient using audio/video enable telemedicine. I verified patient identity with two identifiers, discussed telehealth limitations, and patient agreed to proceed. Patient Location:: home Provider Location:: clinic office Interpreter Needed?: No Pre-visit prep was completed: yes AWV questionnaire completed by patient prior to visit?: no Living arrangements:: with family/others Patient's Overall Health Status Rating: good Typical amount of pain: none Does pain affect daily life?: no Are you currently prescribed opioids?: no  Dietary Habits and Nutritional Risks How many meals a day?: 2 Eats fruit and vegetables daily?: yes Most meals are obtained by: preparing own meals In the last 2 weeks, have you had any of the following?: none Diabetic:: no  Functional Status Activities of Daily Living (to include ambulation/medication): Independent Ambulation: Independent with device- listed below Home Assistive Devices/Equipment: Other (Comment) (hearing aids) Medication Administration: Independent Home Management (perform basic housework or laundry): Independent Manage your own finances?: yes Primary transportation is: driving Concerns about vision?: (!) yes (pt states vision has changed. Needs eye exam) Concerns about hearing?: (!) yes Uses  hearing aids?: (!) yes Hear whispered voice?: yes  Fall Screening Falls in the past year?: 0 Number of falls in past year: 0 Was there an injury with Fall?: 0 Fall Risk Category Calculator: 0 Patient Fall Risk Level: Low Fall Risk  Fall Risk Patient at Risk for Falls Due to: No Fall Risks Fall risk Follow up: Falls evaluation completed  Home and Transportation Safety: All rugs have non-skid backing?: N/A, no rugs All stairs or steps have railings?: yes Grab bars in the bathtub or shower?: (!) no Have non-skid surface in bathtub or shower?: yes Good home lighting?: yes Regular seat belt use?: yes Hospital stays in the last year:: no  Cognitive Assessment Difficulty concentrating, remembering, or making decisions? : yes (a little bit) Will 6CIT or Mini Cog be Completed: yes What year is it?: 0 points What month is it?: 0 points Give patient an address phrase to remember (5 components): [address] [place] About what time is it?: 0 points Count backwards from 20 to 1: 0 points Say the months of the year in reverse: 2 points (Missed April) Repeat the address phrase from earlier: 0 points 6 CIT Score: 2 points  Advance Directives (For Healthcare) Does Patient Have a Medical Advance Directive?: No Would patient like information on creating a medical advance directive?: No - Patient declined  Reviewed/Updated  Reviewed/Updated: Reviewed All (Medical, Surgical, Family, Medications, Allergies, Care Teams, Patient Goals)    Allergies (verified) Patient has no known allergies.   Current Medications (verified) Outpatient Encounter Medications as of 09/05/2024  Medication Sig   alendronate  (FOSAMAX ) 70 MG tablet TAKE 1 TABLET BY MOUTH EVERY SUNDAY. TAKE WITH A FULL GLASS OF WATER ON AN EMPTY STOMACH.   aspirin  EC 81 MG tablet Take 1 tablet (81 mg total) by mouth daily. TAKE 1 TABLET (81 MG TOTAL) BY MOUTH DAILY.  atorvastatin  (LIPITOR) 80 MG tablet Take 1 tablet (80 mg  total) by mouth daily.   clobetasol  ointment (TEMOVATE ) 0.05 % APPLY 1 APPLICATION TOPICALLY DAILY   metoprolol  tartrate (LOPRESSOR ) 25 MG tablet TAKE 1 TABLET BY MOUTH EVERY 12 HOURS   nitroGLYCERIN (NITROSTAT) 0.4 MG SL tablet Place 0.4 mg under the tongue every 5 (five) minutes as needed for chest pain.   furosemide  (LASIX ) 20 MG tablet Take 20 mg by mouth daily. (Patient not taking: Reported on 09/05/2024)   No facility-administered encounter medications on file as of 09/05/2024.    History: Past Medical History:  Diagnosis Date   Acute gastric ulcer without hemorrhage or perforation    Acute osteomyelitis of hand (HCC) 03/01/2015   Acute posthemorrhagic anemia    AKI (acute kidney injury) 02/28/2015   Arthritis    Benign neoplasm of cecum    Coronary artery disease    coronary stent   Dyspnea    Gastritis, Helicobacter pylori    Hard of hearing    Heart murmur    Hypercholesteremia    Hypertension    Myocardial infarction (HCC)    2015   Osteoporosis    Peptic ulcer    Special screening for malignant neoplasms, colon    Stomach irritation    Stroke (HCC)    10-12 years ago   Past Surgical History:  Procedure Laterality Date   CARDIAC SURGERY  05/09/2021   CAROTID STENT     CATARACT EXTRACTION W/PHACO Right 11/09/2017   Procedure: CATARACT EXTRACTION PHACO AND INTRAOCULAR LENS PLACEMENT (IOC) RIGHT;  Surgeon: Myrna Adine Anes, MD;  Location: [facility];  Service: Ophthalmology;  Laterality: Right;   CATARACT EXTRACTION W/PHACO Left 11/23/2017   Procedure: CATARACT EXTRACTION PHACO AND INTRAOCULAR LENS PLACEMENT (IOC) LEFT;  Surgeon: Myrna Adine Anes, MD;  Location: [facility];  Service: Ophthalmology;  Laterality: Left;   COLONOSCOPY WITH PROPOFOL  N/A 10/06/2018   Procedure: COLONOSCOPY WITH PROPOFOL ;  Surgeon: Janalyn Keene NOVAK, MD;  Location: [facility];  Service: Endoscopy;  Laterality: N/A;   ESOPHAGOGASTRODUODENOSCOPY (EGD) WITH PROPOFOL   N/A 02/22/2018   Procedure: ESOPHAGOGASTRODUODENOSCOPY (EGD) WITH PROPOFOL ;  Surgeon: Janalyn Keene NOVAK, MD;  Location: [facility];  Service: Endoscopy;  Laterality: N/A;   ESOPHAGOGASTRODUODENOSCOPY (EGD) WITH PROPOFOL  N/A 06/08/2018   Procedure: ESOPHAGOGASTRODUODENOSCOPY (EGD) WITH PROPOFOL ;  Surgeon: Janalyn Keene NOVAK, MD;  Location: [facility];  Service: Endoscopy;  Laterality: N/A;   ESOPHAGOGASTRODUODENOSCOPY (EGD) WITH PROPOFOL  N/A 10/06/2018   Procedure: ESOPHAGOGASTRODUODENOSCOPY (EGD) WITH PROPOFOL ;  Surgeon: Janalyn Keene NOVAK, MD;  Location: [facility];  Service: Endoscopy;  Laterality: N/A;   INCONTINENCE SURGERY     LEFT HEART CATH AND CORONARY ANGIOGRAPHY N/A 02/06/2021   Procedure: LEFT HEART CATH AND CORONARY ANGIOGRAPHY;  Surgeon: Florencio Cara BIRCH, MD;  Location: [facility];  Service: Cardiovascular;  Laterality: N/A;   THYROID  SURGERY     Family History  Problem Relation Age of Onset   Hyperlipidemia Mother    Hypertension Mother    Ulcers Mother    Heart Problems Mother    Other Father        died in a fire   Ulcers Father    Liver cancer Brother    Diabetes Daughter    Breast cancer Neg Hx    Social History   Occupational History   Occupation: retired   Occupation: part scientist, research (medical)  Tobacco Use   Smoking status: Former    Current packs/day: 0.00    Average packs/day:  1.5 packs/day for 22.0 years (33.0 ttl pk-yrs)    Types: Cigarettes    Start date: 05/08/1992    Quit date: 05/08/2014    Years since quitting: 10.3   Smokeless tobacco: Never   Tobacco comments:    Smoked over 25 years, quit after Heart Attack in 2015.    Vaping Use   Vaping status: Never Used  Substance and Sexual Activity   Alcohol use: No    Alcohol/week: 0.0 standard drinks of alcohol   Drug use: No   Sexual activity: Not Currently   Tobacco Counseling Counseling given: Not Answered Tobacco comments: Smoked over 25 years, quit after Heart Attack in  2015.    SDOH Screenings   Food Insecurity: No Food Insecurity (09/05/2024)  Recent Concern: Food Insecurity - Food Insecurity Present (07/03/2024)  Housing: Low Risk  (09/05/2024)  Transportation Needs: No Transportation Needs (09/05/2024)  Utilities: Not At Risk (09/05/2024)  Alcohol Screen: Low Risk  (12/17/2023)  Depression (PHQ2-9): Low Risk  (09/05/2024)  Financial Resource Strain: Medium Risk (12/17/2023)  Physical Activity: Sufficiently Active (09/05/2024)  Social Connections: Socially Isolated (09/05/2024)  Stress: No Stress Concern Present (09/05/2024)  Tobacco Use: Medium Risk (09/05/2024)  Health Literacy: Adequate Health Literacy (09/05/2024)   See flowsheets for full screening details  Depression Screen PHQ 2 & 9 Depression Scale- Over the past 2 weeks, how often have you been bothered by any of the following problems? Little interest or pleasure in doing things: 0 Feeling down, depressed, or hopeless (PHQ Adolescent also includes...irritable): 0 PHQ-2 Total Score: 0 Trouble falling or staying asleep, or sleeping too much: 3 Feeling tired or having little energy: 0 Poor appetite or overeating (PHQ Adolescent also includes...weight loss): 0 Feeling bad about yourself - or that you are a failure or have let yourself or your family down: 0 Trouble concentrating on things, such as reading the newspaper or watching television (PHQ Adolescent also includes...like school work): 0 Moving or speaking so slowly that other people could have noticed. Or the opposite - being so fidgety or restless that you have been moving around a lot more than usual: 0 Thoughts that you would be better off dead, or of hurting yourself in some way: 0 PHQ-9 Total Score: 3 If you checked off any problems, how difficult have these problems made it for you to do your work, take care of things at home, or get along with other people?: Not difficult at all  Depression Treatment Depression Interventions/Treatment :  EYV7-0 Score <4 Follow-up Not Indicated     Goals Addressed               This Visit's Progress     Maintain current health (pt-stated)               Objective:    Today's Vitals   09/05/24 1000  Weight: 145 lb (65.8 kg)  Height: 5' 3 (1.6 m)   Body mass index is 25.69 kg/m.  Hearing/Vision screen Hearing Screening - Comments:: Wears hearing aids Vision Screening - Comments:: Pt states she is having some vision changes and need to schedule an eye exam. Included a list of eye doctors in [facility] Immunizations and Health Maintenance Health Maintenance  Topic Date Due   Zoster Vaccines- Shingrix (1 of 2) 10/03/2024 (Originally 04/28/1970)   Influenza Vaccine  12/26/2024 (Originally 04/28/2024)   Pneumococcal Vaccine: 50+ Years (2 of 2 - PCV) 07/03/2025 (Originally 05/05/2014)   Lung Cancer Screening  12/26/2024   Bone Density Scan  02/09/2025   Mammogram  07/21/2025   Medicare Annual Wellness (AWV)  09/05/2025   Fecal DNA (Cologuard)  12/27/2026   Hepatitis C Screening  Completed   Meningococcal B Vaccine  Aged Out   DTaP/Tdap/Td  Discontinued   Colonoscopy  Discontinued   COVID-19 Vaccine  Discontinued        Assessment/Plan:  This is a routine wellness examination for Ever.  Patient Care Team: Vicci Duwaine SQUIBB, DO as PCP - General (Family Medicine) Florencio Cara BIRCH, MD as Consulting Physician (Cardiology) Hester Alm BROCKS, MD (Dermatology)  I have personally reviewed and noted the following in the patient's chart:   Medical and social history Use of alcohol, tobacco or illicit drugs  Current medications and supplements including opioid prescriptions. Functional ability and status Nutritional status Physical activity Advanced directives List of other physicians Hospitalizations, surgeries, and ER visits in previous 12 months Vitals Screenings to include cognitive, depression, and falls Referrals and appointments  No orders of the defined types were  placed in this encounter.  In addition, I have reviewed and discussed with patient certain preventive protocols, quality metrics, and best practice recommendations. A written personalized care plan for preventive services as well as general preventive health recommendations were provided to patient.   Vina Ned, CMA   09/05/2024   Return in 53 weeks (on 09/11/2025) for Medicare Annual Wellness Visit.  After Visit Summary: (Mail) Due to this being a telephonic visit, the after visit summary with patients personalized plan was offered to patient via mail   Nurse Notes:  6 CIT Score - 2 Needs routine eye exam. Included a list of eye doctors in [facility] Declined flu, pneumonia, Covid and shingles vaccines Needs Tdap (pharmacy)

## 2025-04-17 ENCOUNTER — Ambulatory Visit: Admitting: Dermatology

## 2025-07-05 ENCOUNTER — Encounter: Admitting: Family Medicine

## 2025-09-11 ENCOUNTER — Ambulatory Visit
